# Patient Record
Sex: Male | Born: 1955 | Race: White | Hispanic: No | Marital: Single | Smoking: Current every day smoker
Health system: Southern US, Community
[De-identification: ages and names within clinical notes are randomized; demographics above are authoritative.]

## PROBLEM LIST (undated history)

## (undated) DIAGNOSIS — A419 Sepsis, unspecified organism: Secondary | ICD-10-CM

## (undated) DIAGNOSIS — F32A Depression, unspecified: Secondary | ICD-10-CM

## (undated) DIAGNOSIS — F319 Bipolar disorder, unspecified: Secondary | ICD-10-CM

## (undated) DIAGNOSIS — F411 Generalized anxiety disorder: Secondary | ICD-10-CM

## (undated) DIAGNOSIS — K219 Gastro-esophageal reflux disease without esophagitis: Secondary | ICD-10-CM

## (undated) DIAGNOSIS — F329 Major depressive disorder, single episode, unspecified: Secondary | ICD-10-CM

## (undated) DIAGNOSIS — R0602 Shortness of breath: Secondary | ICD-10-CM

## (undated) DIAGNOSIS — F419 Anxiety disorder, unspecified: Secondary | ICD-10-CM

## (undated) DIAGNOSIS — I499 Cardiac arrhythmia, unspecified: Secondary | ICD-10-CM

## (undated) DIAGNOSIS — M6282 Rhabdomyolysis: Secondary | ICD-10-CM

## (undated) DIAGNOSIS — R5381 Other malaise: Secondary | ICD-10-CM

## (undated) DIAGNOSIS — J449 Chronic obstructive pulmonary disease, unspecified: Secondary | ICD-10-CM

## (undated) DIAGNOSIS — R569 Unspecified convulsions: Secondary | ICD-10-CM

## (undated) DIAGNOSIS — G252 Other specified forms of tremor: Secondary | ICD-10-CM

## (undated) DIAGNOSIS — G40909 Epilepsy, unspecified, not intractable, without status epilepticus: Secondary | ICD-10-CM

## (undated) DIAGNOSIS — I10 Essential (primary) hypertension: Secondary | ICD-10-CM

## (undated) DIAGNOSIS — L409 Psoriasis, unspecified: Secondary | ICD-10-CM

## (undated) DIAGNOSIS — F259 Schizoaffective disorder, unspecified: Secondary | ICD-10-CM

## (undated) DIAGNOSIS — I5189 Other ill-defined heart diseases: Secondary | ICD-10-CM

## (undated) DIAGNOSIS — R51 Headache: Secondary | ICD-10-CM

## (undated) HISTORY — PX: PLEURAL SCARIFICATION: SHX748

## (undated) HISTORY — DX: Generalized anxiety disorder: F41.1

## (undated) HISTORY — PX: SHOULDER SURGERY: SHX246

## (undated) HISTORY — DX: Schizoaffective disorder, unspecified: F25.9

## (undated) HISTORY — DX: Essential (primary) hypertension: I10

## (undated) HISTORY — PX: SKIN GRAFT: SHX250

## (undated) HISTORY — PX: INTRAOCULAR LENS INSERTION: SHX110

## (undated) HISTORY — DX: Chronic obstructive pulmonary disease, unspecified: J44.9

## (undated) HISTORY — DX: Gastro-esophageal reflux disease without esophagitis: K21.9

## (undated) HISTORY — DX: Epilepsy, unspecified, not intractable, without status epilepticus: G40.909

## (undated) HISTORY — DX: Psoriasis, unspecified: L40.9

## (undated) HISTORY — PX: TOE AMPUTATION: SHX809

## (undated) HISTORY — DX: Bipolar disorder, unspecified: F31.9

---

## 2003-12-11 ENCOUNTER — Inpatient Hospital Stay (HOSPITAL_COMMUNITY): Admission: AD | Admit: 2003-12-11 | Discharge: 2003-12-16 | Payer: Self-pay | Admitting: Psychiatry

## 2009-04-04 ENCOUNTER — Ambulatory Visit: Payer: Self-pay | Admitting: Cardiology

## 2009-04-12 ENCOUNTER — Encounter: Payer: Self-pay | Admitting: Cardiology

## 2009-04-12 ENCOUNTER — Encounter (HOSPITAL_COMMUNITY): Admission: RE | Admit: 2009-04-12 | Discharge: 2009-05-12 | Payer: Self-pay | Admitting: Cardiology

## 2009-04-12 ENCOUNTER — Ambulatory Visit: Payer: Self-pay | Admitting: Cardiology

## 2009-05-01 ENCOUNTER — Ambulatory Visit: Payer: Self-pay | Admitting: Cardiology

## 2009-05-01 ENCOUNTER — Encounter: Payer: Self-pay | Admitting: Physician Assistant

## 2009-05-01 DIAGNOSIS — R569 Unspecified convulsions: Secondary | ICD-10-CM | POA: Insufficient documentation

## 2009-05-01 DIAGNOSIS — F319 Bipolar disorder, unspecified: Secondary | ICD-10-CM | POA: Insufficient documentation

## 2009-05-01 DIAGNOSIS — R079 Chest pain, unspecified: Secondary | ICD-10-CM | POA: Insufficient documentation

## 2009-05-01 DIAGNOSIS — F411 Generalized anxiety disorder: Secondary | ICD-10-CM | POA: Insufficient documentation

## 2009-05-01 DIAGNOSIS — F259 Schizoaffective disorder, unspecified: Secondary | ICD-10-CM | POA: Insufficient documentation

## 2009-05-01 DIAGNOSIS — K219 Gastro-esophageal reflux disease without esophagitis: Secondary | ICD-10-CM | POA: Insufficient documentation

## 2009-05-01 DIAGNOSIS — I10 Essential (primary) hypertension: Secondary | ICD-10-CM | POA: Insufficient documentation

## 2009-05-01 DIAGNOSIS — I4510 Unspecified right bundle-branch block: Secondary | ICD-10-CM | POA: Insufficient documentation

## 2009-05-01 DIAGNOSIS — L408 Other psoriasis: Secondary | ICD-10-CM | POA: Insufficient documentation

## 2009-05-01 DIAGNOSIS — J449 Chronic obstructive pulmonary disease, unspecified: Secondary | ICD-10-CM | POA: Insufficient documentation

## 2010-02-14 ENCOUNTER — Encounter (INDEPENDENT_AMBULATORY_CARE_PROVIDER_SITE_OTHER): Payer: Self-pay | Admitting: Internal Medicine

## 2010-02-14 ENCOUNTER — Ambulatory Visit: Payer: Self-pay | Admitting: Cardiology

## 2010-02-14 ENCOUNTER — Observation Stay (HOSPITAL_COMMUNITY): Admission: EM | Admit: 2010-02-14 | Discharge: 2010-02-14 | Payer: Self-pay | Admitting: Emergency Medicine

## 2010-03-08 ENCOUNTER — Emergency Department (HOSPITAL_COMMUNITY): Admission: EM | Admit: 2010-03-08 | Discharge: 2010-03-08 | Payer: Self-pay | Admitting: Emergency Medicine

## 2010-05-23 ENCOUNTER — Emergency Department (HOSPITAL_COMMUNITY): Admission: EM | Admit: 2010-05-23 | Discharge: 2010-05-23 | Payer: Self-pay | Admitting: Emergency Medicine

## 2011-02-03 LAB — HEPATIC FUNCTION PANEL
ALT: 15 U/L (ref 0–53)
Albumin: 4.5 g/dL (ref 3.5–5.2)
Alkaline Phosphatase: 85 U/L (ref 39–117)
Total Bilirubin: 0.9 mg/dL (ref 0.3–1.2)

## 2011-02-03 LAB — DIFFERENTIAL
Basophils Absolute: 0 10*3/uL (ref 0.0–0.1)
Basophils Relative: 0 % (ref 0–1)
Eosinophils Absolute: 0.1 10*3/uL (ref 0.0–0.7)
Eosinophils Relative: 1 % (ref 0–5)
Lymphocytes Relative: 28 % (ref 12–46)
Lymphs Abs: 2.9 10*3/uL (ref 0.7–4.0)
Monocytes Absolute: 0.6 10*3/uL (ref 0.1–1.0)
Monocytes Relative: 6 % (ref 3–12)
Neutro Abs: 6.9 10*3/uL (ref 1.7–7.7)
Neutrophils Relative %: 66 % (ref 43–77)

## 2011-02-03 LAB — BASIC METABOLIC PANEL
BUN: 7 mg/dL (ref 6–23)
CO2: 21 mEq/L (ref 19–32)
Calcium: 9.2 mg/dL (ref 8.4–10.5)
Chloride: 100 mEq/L (ref 96–112)
Creatinine, Ser: 1.09 mg/dL (ref 0.4–1.5)
GFR calc Af Amer: >60 mL/min (ref >60)
GFR calc non Af Amer: >60 mL/min (ref >60)
Glucose, Bld: 99 mg/dL (ref 70–99)
Potassium: 3.2 mEq/L — ABNORMAL LOW (ref 3.5–5.1)
Sodium: 128 mEq/L — ABNORMAL LOW (ref 135–145)

## 2011-02-03 LAB — POCT CARDIAC MARKERS
CKMB, poc: 2.4 ng/mL (ref 1.0–8.0)
Myoglobin, poc: 146 ng/mL (ref 12–200)
Troponin i, poc: <0.05 ng/mL (ref 0.00–0.09)

## 2011-02-03 LAB — URINALYSIS, ROUTINE W REFLEX MICROSCOPIC
Bilirubin Urine: NEGATIVE
Glucose, UA: NEGATIVE mg/dL
Ketones, ur: NEGATIVE mg/dL
Leukocytes, UA: NEGATIVE
Nitrite: NEGATIVE
Protein, ur: NEGATIVE mg/dL
Specific Gravity, Urine: <1.005 — ABNORMAL LOW (ref 1.005–1.030)
Urobilinogen, UA: 0.2 mg/dL (ref 0.0–1.0)
pH: 5.5 (ref 5.0–8.0)

## 2011-02-03 LAB — CBC
HCT: 40.6 % (ref 39.0–52.0)
MCH: 32.9 pg (ref 26.0–34.0)
MCHC: 34.5 g/dL (ref 30.0–36.0)
RBC: 4.25 MIL/uL (ref 4.22–5.81)
RDW: 13.3 % (ref 11.5–15.5)
WBC: 10.5 10*3/uL (ref 4.0–10.5)

## 2011-02-03 LAB — GLUCOSE, CAPILLARY: Glucose-Capillary: 110 mg/dL — ABNORMAL HIGH (ref 70–99)

## 2011-02-03 LAB — URINE MICROSCOPIC-ADD ON

## 2011-02-11 LAB — BRAIN NATRIURETIC PEPTIDE: Pro B Natriuretic peptide (BNP): <30 pg/mL (ref 0.0–100.0)

## 2011-02-11 LAB — CBC
Hemoglobin: 13.6 g/dL (ref 13.0–17.0)
RBC: 4.07 MIL/uL — ABNORMAL LOW (ref 4.22–5.81)
RDW: 13.4 % (ref 11.5–15.5)

## 2011-02-11 LAB — TSH: TSH: 1.299 u[IU]/mL (ref 0.350–4.500)

## 2011-02-11 LAB — BASIC METABOLIC PANEL
BUN: 7 mg/dL (ref 6–23)
CO2: 25 mEq/L (ref 19–32)
Creatinine, Ser: 0.75 mg/dL (ref 0.4–1.5)
GFR calc Af Amer: >60 mL/min (ref >60)
GFR calc non Af Amer: >60 mL/min (ref >60)
Glucose, Bld: 83 mg/dL (ref 70–99)

## 2011-02-11 LAB — DIFFERENTIAL
Basophils Absolute: 0 10*3/uL (ref 0.0–0.1)
Lymphs Abs: 4.2 10*3/uL — ABNORMAL HIGH (ref 0.7–4.0)
Monocytes Relative: 5 % (ref 3–12)
Neutro Abs: 9.7 10*3/uL — ABNORMAL HIGH (ref 1.7–7.7)
Neutrophils Relative %: 65 % (ref 43–77)

## 2011-02-11 LAB — CARDIAC PANEL(CRET KIN+CKTOT+MB+TROPI)
Total CK: 138 U/L (ref 7–232)
Troponin I: 0.01 ng/mL (ref 0.00–0.06)
Troponin I: 0.04 ng/mL (ref 0.00–0.06)

## 2011-02-11 LAB — POCT CARDIAC MARKERS: Troponin i, poc: <0.05 ng/mL (ref 0.00–0.09)

## 2011-02-11 LAB — PHOSPHORUS: Phosphorus: 3.3 mg/dL (ref 2.3–4.6)

## 2011-02-11 LAB — MRSA PCR SCREENING: MRSA by PCR: NEGATIVE

## 2011-02-11 LAB — LIPID PANEL
LDL Cholesterol: 67 mg/dL (ref 0–99)
Triglycerides: 41 mg/dL (ref <150)

## 2011-02-11 LAB — LITHIUM LEVEL: Lithium Lvl: 0.53 mEq/L — ABNORMAL LOW (ref 0.80–1.40)

## 2011-02-15 ENCOUNTER — Inpatient Hospital Stay (HOSPITAL_COMMUNITY): Admit: 2011-02-15 | Payer: Self-pay | Source: Other Acute Inpatient Hospital | Admitting: Cardiology

## 2011-02-15 ENCOUNTER — Emergency Department (HOSPITAL_COMMUNITY): Payer: Medicaid Other

## 2011-02-15 ENCOUNTER — Emergency Department (HOSPITAL_COMMUNITY)
Admission: EM | Admit: 2011-02-15 | Discharge: 2011-02-15 | Disposition: A | Discharge status: Home / Self Care | Payer: Medicaid Other | Attending: Emergency Medicine | Admitting: Emergency Medicine

## 2011-02-15 DIAGNOSIS — I2510 Atherosclerotic heart disease of native coronary artery without angina pectoris: Secondary | ICD-10-CM | POA: Insufficient documentation

## 2011-02-15 DIAGNOSIS — Z79899 Other long term (current) drug therapy: Secondary | ICD-10-CM | POA: Insufficient documentation

## 2011-02-15 DIAGNOSIS — G40909 Epilepsy, unspecified, not intractable, without status epilepticus: Secondary | ICD-10-CM | POA: Insufficient documentation

## 2011-02-15 DIAGNOSIS — J4489 Other specified chronic obstructive pulmonary disease: Secondary | ICD-10-CM | POA: Insufficient documentation

## 2011-02-15 DIAGNOSIS — I10 Essential (primary) hypertension: Secondary | ICD-10-CM | POA: Insufficient documentation

## 2011-02-15 DIAGNOSIS — J449 Chronic obstructive pulmonary disease, unspecified: Secondary | ICD-10-CM | POA: Insufficient documentation

## 2011-02-15 DIAGNOSIS — E876 Hypokalemia: Secondary | ICD-10-CM | POA: Insufficient documentation

## 2011-02-15 LAB — CBC
HCT: 41 % (ref 39.0–52.0)
Hemoglobin: 14.3 g/dL (ref 13.0–17.0)
MCH: 32.4 pg (ref 26.0–34.0)
MCV: 92.8 fL (ref 78.0–100.0)
Platelets: 267 10*3/uL (ref 150–400)
RBC: 4.42 MIL/uL (ref 4.22–5.81)

## 2011-02-15 LAB — DIFFERENTIAL
Eosinophils Absolute: 0.1 10*3/uL (ref 0.0–0.7)
Lymphocytes Relative: 29 % (ref 12–46)
Lymphs Abs: 2.3 10*3/uL (ref 0.7–4.0)
Monocytes Relative: 6 % (ref 3–12)
Neutrophils Relative %: 63 % (ref 43–77)

## 2011-02-15 LAB — BASIC METABOLIC PANEL
BUN: 9 mg/dL (ref 6–23)
CO2: 19 mEq/L (ref 19–32)
Chloride: 103 mEq/L (ref 96–112)
Creatinine, Ser: 0.88 mg/dL (ref 0.4–1.5)
Glucose, Bld: 90 mg/dL (ref 70–99)

## 2011-02-15 LAB — APTT: aPTT: 36 seconds (ref 24–37)

## 2011-02-15 LAB — POCT CARDIAC MARKERS
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
## 2011-05-04 ENCOUNTER — Inpatient Hospital Stay: Payer: Self-pay | Admitting: Unknown Physician Specialty

## 2011-05-12 ENCOUNTER — Emergency Department: Payer: Self-pay | Admitting: Emergency Medicine

## 2011-06-24 ENCOUNTER — Emergency Department: Payer: Self-pay | Admitting: *Deleted

## 2011-07-08 ENCOUNTER — Inpatient Hospital Stay: Payer: Self-pay | Admitting: Psychiatry

## 2011-10-14 ENCOUNTER — Encounter: Payer: Self-pay | Admitting: Cardiology

## 2011-11-19 LAB — COMPREHENSIVE METABOLIC PANEL
Albumin: 4.2 g/dL (ref 3.4–5.0)
Alkaline Phosphatase: 101 U/L (ref 50–136)
Anion Gap: 10 (ref 7–16)
BUN: 8 mg/dL (ref 7–18)
Bilirubin,Total: 0.3 mg/dL (ref 0.2–1.0)
Calcium, Total: 9 mg/dL (ref 8.5–10.1)
Chloride: 102 mmol/L (ref 98–107)
Co2: 25 mmol/L (ref 21–32)
Creatinine: 0.8 mg/dL (ref 0.60–1.30)
EGFR (African American): >60
EGFR (Non-African Amer.): >60
Glucose: 108 mg/dL — ABNORMAL HIGH (ref 65–99)
Osmolality: 273 (ref 275–301)
Potassium: 3.7 mmol/L (ref 3.5–5.1)
SGOT(AST): 16 U/L (ref 15–37)
SGPT (ALT): 25 U/L
Sodium: 137 mmol/L (ref 136–145)
Total Protein: 7.4 g/dL (ref 6.4–8.2)

## 2011-11-19 LAB — CBC
HCT: 41.8 % (ref 40.0–52.0)
HGB: 14.2 g/dL (ref 13.0–18.0)
MCH: 32.7 pg (ref 26.0–34.0)
MCHC: 34.1 g/dL (ref 32.0–36.0)
MCV: 96 fL (ref 80–100)
Platelet: 210 10*3/uL (ref 150–440)
RBC: 4.35 10*6/uL — ABNORMAL LOW (ref 4.40–5.90)
RDW: 14 % (ref 11.5–14.5)
WBC: 7.1 10*3/uL (ref 3.8–10.6)

## 2011-11-19 LAB — DRUG SCREEN, URINE
Amphetamines, Ur Screen: NEGATIVE (ref ?–1000)
Barbiturates, Ur Screen: NEGATIVE (ref ?–200)
Cocaine Metabolite,Ur ~~LOC~~: NEGATIVE (ref ?–300)
MDMA (Ecstasy)Ur Screen: NEGATIVE (ref ?–500)
Opiate, Ur Screen: NEGATIVE (ref ?–300)
Phencyclidine (PCP) Ur S: NEGATIVE (ref ?–25)
Tricyclic, Ur Screen: NEGATIVE (ref ?–1000)

## 2011-11-19 LAB — TSH: Thyroid Stimulating Horm: 0.46 u[IU]/mL

## 2011-11-19 LAB — SALICYLATE LEVEL: Salicylates, Serum: <1.7 mg/dL

## 2011-11-20 ENCOUNTER — Inpatient Hospital Stay: Payer: Self-pay | Admitting: Psychiatry

## 2011-11-21 LAB — BEHAVIORAL MEDICINE 1 PANEL
Alkaline Phosphatase: 93 U/L (ref 50–136)
Anion Gap: 7 (ref 7–16)
Bilirubin,Total: 0.3 mg/dL (ref 0.2–1.0)
Chloride: 100 mmol/L (ref 98–107)
Co2: 29 mmol/L (ref 21–32)
Creatinine: 0.97 mg/dL (ref 0.60–1.30)
EGFR (African American): >60
EGFR (Non-African Amer.): >60
Eosinophil %: 2.9 %
HCT: 43.5 % (ref 40.0–52.0)
Lymphocyte #: 2.9 10*3/uL (ref 1.0–3.6)
Lymphocyte %: 26.6 %
MCH: 32 pg (ref 26.0–34.0)
MCHC: 32.8 g/dL (ref 32.0–36.0)
MCV: 97 fL (ref 80–100)
Neutrophil #: 7.1 10*3/uL — ABNORMAL HIGH (ref 1.4–6.5)
Osmolality: 270 (ref 275–301)
Potassium: 4.4 mmol/L (ref 3.5–5.1)
RDW: 13.8 % (ref 11.5–14.5)
Sodium: 136 mmol/L (ref 136–145)
Total Protein: 6.9 g/dL (ref 6.4–8.2)

## 2011-11-21 LAB — URINALYSIS, COMPLETE
Bilirubin,UR: NEGATIVE
Blood: NEGATIVE
Glucose,UR: NEGATIVE mg/dL (ref 0–75)
Ketone: NEGATIVE
Specific Gravity: 1.001 (ref 1.003–1.030)
WBC UR: <1 /HPF (ref 0–5)

## 2011-11-22 LAB — BEHAVIORAL MEDICINE 1 PANEL
Anion Gap: 9 (ref 7–16)
BUN: 10 mg/dL (ref 7–18)
Basophil %: 1 %
Chloride: 98 mmol/L (ref 98–107)
Co2: 29 mmol/L (ref 21–32)
EGFR (African American): >60
Eosinophil #: 0.3 10*3/uL (ref 0.0–0.7)
HCT: 44.2 % (ref 40.0–52.0)
HGB: 14.7 g/dL (ref 13.0–18.0)
MCH: 32.2 pg (ref 26.0–34.0)
MCHC: 33.2 g/dL (ref 32.0–36.0)
Monocyte #: 0.5 10*3/uL (ref 0.0–0.7)
Neutrophil #: 2.8 10*3/uL (ref 1.4–6.5)
Neutrophil %: 49.6 %
Osmolality: 270 (ref 275–301)
Platelet: 202 10*3/uL (ref 150–440)
SGOT(AST): 11 U/L — ABNORMAL LOW (ref 15–37)
SGPT (ALT): 21 U/L
Thyroid Stimulating Horm: 1.26 u[IU]/mL
Total Protein: 7 g/dL (ref 6.4–8.2)

## 2011-11-25 LAB — BASIC METABOLIC PANEL
BUN: 17 mg/dL (ref 7–18)
Chloride: 99 mmol/L (ref 98–107)
Co2: 31 mmol/L (ref 21–32)
Creatinine: 0.82 mg/dL (ref 0.60–1.30)
EGFR (African American): >60
Sodium: 138 mmol/L (ref 136–145)

## 2012-04-27 LAB — COMPREHENSIVE METABOLIC PANEL
Albumin: 3.8 g/dL (ref 3.4–5.0)
BUN: 12 mg/dL (ref 7–18)
Bilirubin,Total: 0.3 mg/dL (ref 0.2–1.0)
Calcium, Total: 8.8 mg/dL (ref 8.5–10.1)
Chloride: 98 mmol/L (ref 98–107)
Creatinine: 0.95 mg/dL (ref 0.60–1.30)
EGFR (African American): >60
Osmolality: 268 (ref 275–301)
Potassium: 4.1 mmol/L (ref 3.5–5.1)
SGOT(AST): 13 U/L — ABNORMAL LOW (ref 15–37)
SGPT (ALT): 20 U/L
Sodium: 134 mmol/L — ABNORMAL LOW (ref 136–145)

## 2012-04-27 LAB — CBC
HCT: 38.3 % — ABNORMAL LOW (ref 40.0–52.0)
MCH: 33 pg (ref 26.0–34.0)
MCHC: 34.9 g/dL (ref 32.0–36.0)
MCV: 95 fL (ref 80–100)
Platelet: 219 10*3/uL (ref 150–440)
RBC: 4.05 10*6/uL — ABNORMAL LOW (ref 4.40–5.90)
RDW: 14.9 % — ABNORMAL HIGH (ref 11.5–14.5)

## 2012-04-27 LAB — LITHIUM LEVEL: Lithium: 0.65 mmol/L

## 2012-04-27 LAB — TSH: Thyroid Stimulating Horm: 0.62 u[IU]/mL

## 2012-04-27 LAB — ETHANOL: Ethanol %: <0.003 % (ref 0.000–0.080)

## 2012-04-28 ENCOUNTER — Inpatient Hospital Stay: Payer: Self-pay | Admitting: Psychiatry

## 2012-04-28 LAB — DRUG SCREEN, URINE
Amphetamines, Ur Screen: NEGATIVE (ref ?–1000)
Barbiturates, Ur Screen: NEGATIVE (ref ?–200)
Benzodiazepine, Ur Scrn: NEGATIVE (ref ?–200)
Cocaine Metabolite,Ur ~~LOC~~: NEGATIVE (ref ?–300)
MDMA (Ecstasy)Ur Screen: NEGATIVE (ref ?–500)
Methadone, Ur Screen: NEGATIVE (ref ?–300)
Opiate, Ur Screen: NEGATIVE (ref ?–300)
Phencyclidine (PCP) Ur S: NEGATIVE (ref ?–25)
Tricyclic, Ur Screen: NEGATIVE (ref ?–1000)

## 2012-04-28 LAB — URINALYSIS, COMPLETE
Bacteria: NONE SEEN
Glucose,UR: NEGATIVE mg/dL (ref 0–75)
Ketone: NEGATIVE
Protein: NEGATIVE
Squamous Epithelial: NONE SEEN

## 2012-05-03 LAB — COMPREHENSIVE METABOLIC PANEL
Albumin: 3.5 g/dL (ref 3.4–5.0)
Alkaline Phosphatase: 108 U/L (ref 50–136)
Anion Gap: 8 (ref 7–16)
BUN: 17 mg/dL (ref 7–18)
Bilirubin,Total: 0.3 mg/dL (ref 0.2–1.0)
Calcium, Total: 8.7 mg/dL (ref 8.5–10.1)
Chloride: 99 mmol/L (ref 98–107)
Co2: 27 mmol/L (ref 21–32)
Creatinine: 0.89 mg/dL (ref 0.60–1.30)
EGFR (African American): >60
EGFR (Non-African Amer.): >60
Glucose: 83 mg/dL (ref 65–99)
Osmolality: 269 (ref 275–301)
Potassium: 4.4 mmol/L (ref 3.5–5.1)
SGPT (ALT): 23 U/L
Sodium: 134 mmol/L — ABNORMAL LOW (ref 136–145)
Total Protein: 6.6 g/dL (ref 6.4–8.2)

## 2012-05-03 LAB — TROPONIN I: Troponin-I: <0.02 ng/mL

## 2012-05-03 LAB — CK-MB: CK-MB: 1.3 ng/mL (ref 0.5–3.6)

## 2012-05-03 LAB — CK: CK, Total: 79 U/L (ref 35–232)

## 2012-05-03 LAB — MAGNESIUM: Magnesium: 2 mg/dL

## 2012-05-04 LAB — LIPID PANEL
Cholesterol: 147 mg/dL (ref 0–200)
HDL Cholesterol: 39 mg/dL — ABNORMAL LOW (ref 40–60)
Ldl Cholesterol, Calc: 63 mg/dL (ref 0–100)
Triglycerides: 223 mg/dL — ABNORMAL HIGH (ref 0–200)
VLDL Cholesterol, Calc: 45 mg/dL — ABNORMAL HIGH (ref 5–40)

## 2012-05-04 LAB — TROPONIN I: Troponin-I: <0.02 ng/mL

## 2012-05-04 LAB — CK-MB: CK-MB: 1.2 ng/mL (ref 0.5–3.6)

## 2012-10-09 ENCOUNTER — Emergency Department: Payer: Self-pay | Admitting: Emergency Medicine

## 2013-04-08 ENCOUNTER — Emergency Department (HOSPITAL_COMMUNITY)
Admission: EM | Admit: 2013-04-08 | Discharge: 2013-04-08 | Disposition: A | Discharge status: Home / Self Care | Payer: Medicaid Other | Attending: Emergency Medicine | Admitting: Emergency Medicine

## 2013-04-08 ENCOUNTER — Encounter (HOSPITAL_COMMUNITY): Payer: Self-pay | Admitting: Emergency Medicine

## 2013-04-08 DIAGNOSIS — Z872 Personal history of diseases of the skin and subcutaneous tissue: Secondary | ICD-10-CM | POA: Insufficient documentation

## 2013-04-08 DIAGNOSIS — F259 Schizoaffective disorder, unspecified: Secondary | ICD-10-CM | POA: Insufficient documentation

## 2013-04-08 DIAGNOSIS — I10 Essential (primary) hypertension: Secondary | ICD-10-CM | POA: Insufficient documentation

## 2013-04-08 DIAGNOSIS — K219 Gastro-esophageal reflux disease without esophagitis: Secondary | ICD-10-CM | POA: Insufficient documentation

## 2013-04-08 DIAGNOSIS — Z880 Allergy status to penicillin: Secondary | ICD-10-CM | POA: Insufficient documentation

## 2013-04-08 DIAGNOSIS — Z7982 Long term (current) use of aspirin: Secondary | ICD-10-CM | POA: Insufficient documentation

## 2013-04-08 DIAGNOSIS — F17200 Nicotine dependence, unspecified, uncomplicated: Secondary | ICD-10-CM | POA: Insufficient documentation

## 2013-04-08 DIAGNOSIS — F32A Depression, unspecified: Secondary | ICD-10-CM

## 2013-04-08 DIAGNOSIS — F411 Generalized anxiety disorder: Secondary | ICD-10-CM | POA: Insufficient documentation

## 2013-04-08 DIAGNOSIS — F329 Major depressive disorder, single episode, unspecified: Secondary | ICD-10-CM

## 2013-04-08 DIAGNOSIS — J449 Chronic obstructive pulmonary disease, unspecified: Secondary | ICD-10-CM | POA: Insufficient documentation

## 2013-04-08 DIAGNOSIS — G40909 Epilepsy, unspecified, not intractable, without status epilepticus: Secondary | ICD-10-CM | POA: Insufficient documentation

## 2013-04-08 DIAGNOSIS — J4489 Other specified chronic obstructive pulmonary disease: Secondary | ICD-10-CM | POA: Insufficient documentation

## 2013-04-08 DIAGNOSIS — F3130 Bipolar disorder, current episode depressed, mild or moderate severity, unspecified: Secondary | ICD-10-CM | POA: Insufficient documentation

## 2013-04-08 DIAGNOSIS — IMO0002 Reserved for concepts with insufficient information to code with codable children: Secondary | ICD-10-CM | POA: Insufficient documentation

## 2013-04-08 DIAGNOSIS — Z79899 Other long term (current) drug therapy: Secondary | ICD-10-CM | POA: Insufficient documentation
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
## 2013-04-08 NOTE — ED Notes (Signed)
Pt states here for depression that has been getting worse over past three months. Pt states has been thinking of ways to hurt himself.denies attempting recently.

## 2013-04-08 NOTE — ED Provider Notes (Signed)
Tele-psychiatry consultation obtained.   No suicidal or homicidal ideation.   Patient can followup with community mental health resources   Donnetta Hutching, MD 04/08/13 1800

## 2013-04-08 NOTE — ED Notes (Signed)
Pt online with tele-psychiatrist.
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
## 2013-04-13 NOTE — ED Provider Notes (Signed)
Dr Italy Sheldon is attending  Jason Hutching, MD 04/13/13 [phone]

## 2013-08-17 ENCOUNTER — Emergency Department (HOSPITAL_COMMUNITY): Payer: Medicaid Other

## 2013-08-17 ENCOUNTER — Emergency Department (HOSPITAL_COMMUNITY)
Admission: EM | Admit: 2013-08-17 | Discharge: 2013-08-17 | Disposition: A | Discharge status: Home / Self Care | Payer: Medicaid Other | Attending: Emergency Medicine | Admitting: Emergency Medicine

## 2013-08-17 ENCOUNTER — Encounter (HOSPITAL_COMMUNITY): Payer: Self-pay | Admitting: Emergency Medicine

## 2013-08-17 DIAGNOSIS — L408 Other psoriasis: Secondary | ICD-10-CM | POA: Insufficient documentation

## 2013-08-17 DIAGNOSIS — Z7982 Long term (current) use of aspirin: Secondary | ICD-10-CM | POA: Insufficient documentation

## 2013-08-17 DIAGNOSIS — G40909 Epilepsy, unspecified, not intractable, without status epilepticus: Secondary | ICD-10-CM | POA: Insufficient documentation

## 2013-08-17 DIAGNOSIS — F259 Schizoaffective disorder, unspecified: Secondary | ICD-10-CM | POA: Insufficient documentation

## 2013-08-17 DIAGNOSIS — S298XXA Other specified injuries of thorax, initial encounter: Secondary | ICD-10-CM | POA: Insufficient documentation

## 2013-08-17 DIAGNOSIS — F411 Generalized anxiety disorder: Secondary | ICD-10-CM | POA: Insufficient documentation

## 2013-08-17 DIAGNOSIS — Z79899 Other long term (current) drug therapy: Secondary | ICD-10-CM | POA: Insufficient documentation

## 2013-08-17 DIAGNOSIS — I10 Essential (primary) hypertension: Secondary | ICD-10-CM | POA: Insufficient documentation

## 2013-08-17 DIAGNOSIS — IMO0002 Reserved for concepts with insufficient information to code with codable children: Secondary | ICD-10-CM | POA: Insufficient documentation

## 2013-08-17 DIAGNOSIS — X58XXXA Exposure to other specified factors, initial encounter: Secondary | ICD-10-CM | POA: Insufficient documentation

## 2013-08-17 DIAGNOSIS — J4489 Other specified chronic obstructive pulmonary disease: Secondary | ICD-10-CM | POA: Insufficient documentation

## 2013-08-17 DIAGNOSIS — F319 Bipolar disorder, unspecified: Secondary | ICD-10-CM | POA: Insufficient documentation

## 2013-08-17 DIAGNOSIS — S301XXA Contusion of abdominal wall, initial encounter: Secondary | ICD-10-CM | POA: Insufficient documentation

## 2013-08-17 DIAGNOSIS — Y939 Activity, unspecified: Secondary | ICD-10-CM | POA: Insufficient documentation

## 2013-08-17 DIAGNOSIS — Y929 Unspecified place or not applicable: Secondary | ICD-10-CM | POA: Insufficient documentation

## 2013-08-17 DIAGNOSIS — T148XXA Other injury of unspecified body region, initial encounter: Secondary | ICD-10-CM

## 2013-08-17 DIAGNOSIS — Z8701 Personal history of pneumonia (recurrent): Secondary | ICD-10-CM | POA: Insufficient documentation

## 2013-08-17 DIAGNOSIS — J449 Chronic obstructive pulmonary disease, unspecified: Secondary | ICD-10-CM | POA: Insufficient documentation

## 2013-08-17 DIAGNOSIS — Z791 Long term (current) use of non-steroidal anti-inflammatories (NSAID): Secondary | ICD-10-CM | POA: Insufficient documentation

## 2013-08-17 DIAGNOSIS — Z880 Allergy status to penicillin: Secondary | ICD-10-CM | POA: Insufficient documentation

## 2013-08-17 DIAGNOSIS — K219 Gastro-esophageal reflux disease without esophagitis: Secondary | ICD-10-CM | POA: Insufficient documentation

## 2013-08-17 DIAGNOSIS — F17200 Nicotine dependence, unspecified, uncomplicated: Secondary | ICD-10-CM | POA: Insufficient documentation

## 2013-08-17 LAB — CBC WITH DIFFERENTIAL/PLATELET
Basophils Absolute: 0 10*3/uL (ref 0.0–0.1)
Basophils Relative: 0 % (ref 0–1)
Eosinophils Relative: 1 % (ref 0–5)
HCT: 38.5 % — ABNORMAL LOW (ref 39.0–52.0)
Hemoglobin: 13.1 g/dL (ref 13.0–17.0)
MCH: 32.3 pg (ref 26.0–34.0)
MCHC: 34 g/dL (ref 30.0–36.0)
Monocytes Absolute: 0.6 10*3/uL (ref 0.1–1.0)
Monocytes Relative: 6 % (ref 3–12)
Neutro Abs: 6.4 10*3/uL (ref 1.7–7.7)
RDW: 13.7 % (ref 11.5–15.5)

## 2013-08-17 LAB — RAPID URINE DRUG SCREEN, HOSP PERFORMED
Barbiturates: NOT DETECTED
Benzodiazepines: NOT DETECTED
Cocaine: NOT DETECTED
Opiates: NOT DETECTED
Tetrahydrocannabinol: NOT DETECTED

## 2013-08-17 LAB — URINALYSIS, ROUTINE W REFLEX MICROSCOPIC
Bilirubin Urine: NEGATIVE
Ketones, ur: NEGATIVE mg/dL
Nitrite: NEGATIVE
Protein, ur: NEGATIVE mg/dL
Urobilinogen, UA: 0.2 mg/dL (ref 0.0–1.0)
pH: 6.5 (ref 5.0–8.0)

## 2013-08-17 LAB — COMPREHENSIVE METABOLIC PANEL
AST: 13 U/L (ref 0–37)
Albumin: 3.3 g/dL — ABNORMAL LOW (ref 3.5–5.2)
BUN: 10 mg/dL (ref 6–23)
Calcium: 8.9 mg/dL (ref 8.4–10.5)
Chloride: 97 mEq/L (ref 96–112)
Creatinine, Ser: 0.97 mg/dL (ref 0.50–1.35)
GFR calc Af Amer: >90 mL/min (ref >90)
GFR calc non Af Amer: 89 mL/min — ABNORMAL LOW (ref >90)
Glucose, Bld: 143 mg/dL — ABNORMAL HIGH (ref 70–99)
Total Bilirubin: 0.3 mg/dL (ref 0.3–1.2)

## 2013-08-17 LAB — PROTIME-INR: Prothrombin Time: 12.9 seconds (ref 11.6–15.2)

## 2013-08-17 LAB — LITHIUM LEVEL: Lithium Lvl: 0.72 mEq/L — ABNORMAL LOW (ref 0.80–1.40)

## 2013-08-17 MED ORDER — IOHEXOL 300 MG/ML  SOLN
100.0000 mL | Freq: Once | INTRAMUSCULAR | Status: AC | PRN
  Administered 2013-08-17: 100 mL via INTRAVENOUS

## 2013-08-17 MED ORDER — SODIUM CHLORIDE 0.9 % IV BOLUS (SEPSIS)
1000.0000 mL | Freq: Once | INTRAVENOUS | Status: AC
  Administered 2013-08-17: 1000 mL via INTRAVENOUS

## 2013-08-17 MED ORDER — FENTANYL CITRATE 0.05 MG/ML IJ SOLN
INTRAMUSCULAR | Status: AC
  Administered 2013-08-17: 50 ug via INTRAVENOUS
  Filled 2013-08-17: qty 2

## 2013-08-17 NOTE — ED Notes (Signed)
Patient signed paper discharge instructions during downtime  At 04:20 am

## 2013-08-17 NOTE — ED Notes (Signed)
C/o pain and tenderness left rib area under arm and down to left hip and thigh.  Bruising in various colors noted.  Some are deep purple and other areas on abdomen and arms have yellow tints and old red shades.  Patient was not aware of the bruising to his left side until a tech at [facility] asked him what had happened to him, " took a picture of it to show me on the phone"    Denies any known injury, denies any falls,

## 2013-08-17 NOTE — ED Notes (Signed)
Greggory Stallion will send someone here around 07:00 am to pick up.  Will try to send earlier if transport can be located.

## 2013-08-17 NOTE — ED Notes (Signed)
Patient to be discharged.  Attempting to find way home for patient

## 2013-08-17 NOTE — ED Notes (Signed)
Pt c/o left flank pain. Pt has diffused bruising from armpit to the abd. Pt does not know where the bruising is coming from.
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
## 2014-01-21 ENCOUNTER — Emergency Department (HOSPITAL_COMMUNITY)
Admission: EM | Admit: 2014-01-21 | Discharge: 2014-01-21 | Disposition: A | Discharge status: Home / Self Care | Payer: Medicaid Other | Attending: Emergency Medicine | Admitting: Emergency Medicine

## 2014-01-21 ENCOUNTER — Encounter (HOSPITAL_COMMUNITY): Payer: Self-pay | Admitting: Emergency Medicine

## 2014-01-21 DIAGNOSIS — R451 Restlessness and agitation: Secondary | ICD-10-CM

## 2014-01-21 DIAGNOSIS — J4489 Other specified chronic obstructive pulmonary disease: Secondary | ICD-10-CM | POA: Insufficient documentation

## 2014-01-21 DIAGNOSIS — F259 Schizoaffective disorder, unspecified: Secondary | ICD-10-CM | POA: Insufficient documentation

## 2014-01-21 DIAGNOSIS — F17200 Nicotine dependence, unspecified, uncomplicated: Secondary | ICD-10-CM | POA: Insufficient documentation

## 2014-01-21 DIAGNOSIS — Z79899 Other long term (current) drug therapy: Secondary | ICD-10-CM | POA: Insufficient documentation

## 2014-01-21 DIAGNOSIS — F411 Generalized anxiety disorder: Secondary | ICD-10-CM | POA: Insufficient documentation

## 2014-01-21 DIAGNOSIS — K219 Gastro-esophageal reflux disease without esophagitis: Secondary | ICD-10-CM | POA: Insufficient documentation

## 2014-01-21 DIAGNOSIS — Z791 Long term (current) use of non-steroidal anti-inflammatories (NSAID): Secondary | ICD-10-CM | POA: Insufficient documentation

## 2014-01-21 DIAGNOSIS — F3130 Bipolar disorder, current episode depressed, mild or moderate severity, unspecified: Secondary | ICD-10-CM | POA: Insufficient documentation

## 2014-01-21 DIAGNOSIS — Z872 Personal history of diseases of the skin and subcutaneous tissue: Secondary | ICD-10-CM | POA: Insufficient documentation

## 2014-01-21 DIAGNOSIS — F32A Depression, unspecified: Secondary | ICD-10-CM

## 2014-01-21 DIAGNOSIS — F329 Major depressive disorder, single episode, unspecified: Secondary | ICD-10-CM

## 2014-01-21 DIAGNOSIS — Z7982 Long term (current) use of aspirin: Secondary | ICD-10-CM | POA: Insufficient documentation

## 2014-01-21 DIAGNOSIS — G40909 Epilepsy, unspecified, not intractable, without status epilepticus: Secondary | ICD-10-CM | POA: Insufficient documentation

## 2014-01-21 DIAGNOSIS — IMO0002 Reserved for concepts with insufficient information to code with codable children: Secondary | ICD-10-CM | POA: Insufficient documentation

## 2014-01-21 DIAGNOSIS — Z880 Allergy status to penicillin: Secondary | ICD-10-CM | POA: Insufficient documentation

## 2014-01-21 DIAGNOSIS — J449 Chronic obstructive pulmonary disease, unspecified: Secondary | ICD-10-CM | POA: Insufficient documentation

## 2014-01-21 DIAGNOSIS — I10 Essential (primary) hypertension: Secondary | ICD-10-CM | POA: Insufficient documentation

## 2014-01-21 HISTORY — DX: Unspecified convulsions: R56.9

## 2014-01-21 LAB — BASIC METABOLIC PANEL
BUN: 5 mg/dL — ABNORMAL LOW (ref 6–23)
CALCIUM: 9.8 mg/dL (ref 8.4–10.5)
CHLORIDE: 98 meq/L (ref 96–112)
CO2: 23 meq/L (ref 19–32)
Creatinine, Ser: 0.92 mg/dL (ref 0.50–1.35)
GFR calc non Af Amer: >90 mL/min (ref >90)
Glucose, Bld: 111 mg/dL — ABNORMAL HIGH (ref 70–99)
Potassium: 3.7 mEq/L (ref 3.7–5.3)
SODIUM: 136 meq/L — AB (ref 137–147)

## 2014-01-21 LAB — CBC WITH DIFFERENTIAL/PLATELET
BASOS ABS: 0 10*3/uL (ref 0.0–0.1)
BASOS PCT: 0 % (ref 0–1)
Eosinophils Absolute: 0.1 10*3/uL (ref 0.0–0.7)
Eosinophils Relative: 1 % (ref 0–5)
HCT: 43.9 % (ref 39.0–52.0)
Hemoglobin: 15.1 g/dL (ref 13.0–17.0)
LYMPHS PCT: 21 % (ref 12–46)
Lymphs Abs: 2.1 10*3/uL (ref 0.7–4.0)
MCH: 32.8 pg (ref 26.0–34.0)
MCHC: 34.4 g/dL (ref 30.0–36.0)
MCV: 95.4 fL (ref 78.0–100.0)
Monocytes Absolute: 0.7 10*3/uL (ref 0.1–1.0)
Monocytes Relative: 7 % (ref 3–12)
NEUTROS ABS: 7.2 10*3/uL (ref 1.7–7.7)
Neutrophils Relative %: 72 % (ref 43–77)
PLATELETS: 218 10*3/uL (ref 150–400)
RBC: 4.6 MIL/uL (ref 4.22–5.81)
RDW: 14.4 % (ref 11.5–15.5)
WBC: 10 10*3/uL (ref 4.0–10.5)

## 2014-01-21 LAB — RAPID URINE DRUG SCREEN, HOSP PERFORMED
AMPHETAMINES: NOT DETECTED
BENZODIAZEPINES: NOT DETECTED
Barbiturates: NOT DETECTED
Cocaine: NOT DETECTED
OPIATES: NOT DETECTED
Tetrahydrocannabinol: NOT DETECTED

## 2014-01-21 LAB — ETHANOL
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
## 2014-01-21 NOTE — BH Assessment (Signed)
Clinician consulted with Alberteen SamFran Hobson NP who states Pt does not meet criteria for inpatient admission and requests for pt to contract for safety. Clinician contacted Dr. Clarene DukeMcManus to provide recommendation.  Clinician also faxed over No-Harm contract for pt to sign prior to discharge. Darl PikesSusan, RN was unavailable at this time per Olivia MackieFelinda, [place].   Yaakov Guthrieelilah Stewart, MSW, LCSW Triage Specialist [phone]

## 2014-01-21 NOTE — BH Assessment (Signed)
Clinician contacted Dr. Clarene DukeMcManus to gather report about pt prior to beginning assessment. Clinician contacted Elpidio GaleaSusan, Rn working with pt to request set up tele-assessment. Tele-assessment to be initiated.   Jason Patterson Jason Patterson, MSW, LCSW Triage Specialist [phone]

## 2014-01-21 NOTE — Discharge Instructions (Signed)
°Emergency Department Resource Guide °1) Find a Doctor and Pay Out of Pocket °Although you won't have to find out who is covered by your insurance plan, it is a good idea to ask around and get recommendations. You will then need to call the office and see if the doctor you have chosen will accept you as a new patient and what types of options they offer for patients who are self-pay. Some doctors offer discounts or will set up payment plans for their patients who do not have insurance, but you will need to ask so you aren't surprised when you get to your appointment. ° °2) Contact Your Local Health Department °Not all health departments have doctors that can see patients for sick visits, but many do, so it is worth a call to see if yours does. If you don't know where your local health department is, you can check in your phone book. The CDC also has a tool to help you locate your state's health department, and many state websites also have listings of all of their local health departments. ° °3) Find a Walk-in Clinic °If your illness is not likely to be very severe or complicated, you may want to try a walk in clinic. These are popping up all over the country in pharmacies, drugstores, and shopping centers. They're usually staffed by nurse practitioners or physician assistants that have been trained to treat common illnesses and complaints. They're usually fairly quick and inexpensive. However, if you have serious medical issues or chronic medical problems, these are probably not your best option. ° °No Primary Care Doctor: °- Call [facility] at  832-8000 - they can help you locate a primary care doctor that  accepts your insurance, provides certain services, etc. °- Physician Referral Service- 1-800-533-3463 ° °Chronic Pain Problems: °Organization         Address  Phone   Notes  °[facility]  (336) 297-2271 Patients need to be referred by their primary care doctor.  ° °Medication  Assistance: °Organization         Address  Phone   Notes  °[place] [facility] [address] °[place], [place] [address] (336) 641-8030 --Must be a resident of [place] °-- Must have NO insurance coverage whatsoever (no Medicaid/ Medicare, etc.) °-- The pt. MUST have a primary care doctor that directs their care regularly and follows them in the community °  °[facility]  (866) 331-1348   °[employer]  (888) 892-1162   ° °Agencies that provide inexpensive medical care: °Organization         Address  Phone   Notes  °[facility]  (336) 832-8035   °[facility]    (336) 832-7272   °[facility] [address] °[place], [place] [address] (336) 832-4777   °[facility] [address], °[place] (336) 271-4999   °[facility]    (336) 373-0678   °[facility]    (336) 272-1050   °[facility] ° [address], [place] Phone:  (336) 832-4444, Fax:  (336) 832-4440 Hours of Operation:  9 am - 6 pm, M-F.  Also accepts Medicaid/Medicare and self-pay.  °[facility] ° [address], [place] Phone: (336) 832-3150, Fax: (336) 832-3151. Hours of Operation:  8:30 am - 5:30 pm, M-F.  Also accepts Medicaid and self-pay.  °[facility] [place] [address], [place] Phone: (336) 878-6027   °[facility] [address], [place], [place] (336)723-1848, Ext. 123 Mondays & Thursdays: 7-9 AM.  First 15 patients are seen on a first come, first serve basis. °  ° °Medicaid-accepting [place] Providers: ° °Organization         Address  Phone   Notes  °[facility] [address], [place] (336) 641-2100 Also accepts self-pay patients.  °[facility] [address], [place] ° (336) 856-9996   °[facility] [address], [place]  (336) 288-8857   °[facility] [address] [place] [address], [place] (336) 299-7000   °Veita Bland [address], [place]  ° (336) 373-1557 Only accepts [place] Access Medicaid patients after they have their name applied to their card.  ° °Self-Pay (no insurance) in [place]: ° °Organization         Address  Phone   Notes  °Sickle Cell Patients, [facility] [address], [place] (336) 832-1970   °[facility] [address], [place] (336) 832-4400   °[facility] ° [address], [place] (336) 992-4800   °[facility]/Dr. Osei-Bonsu ° [address] [place] [address], [place] or [address], [place] (336) [phone] Phone number for both [place] and [place] locations is the same.  °[facility] [address], [place] (336) 299-0000   °[facility] [address] [place] [address], [place] or [address] (336) 852-7530 °(336) 878-2260   °[facility] [address], [place] (336) 350-1642, phone; (336) 294-5005, fax Sees patients 1st and 3rd Saturday of every month.  Must not qualify for public or private insurance (i.e. Medicaid, Medicare, [place] Health Choice, Veterans' Benefits) • Household income should be no more than 200% of the poverty level •The clinic cannot treat you if you are pregnant or think you are pregnant • Sexually transmitted diseases are not treated at the clinic.  ° ° °Dental Care: °Organization         Address  Phone  Notes  °[place] [facility] [address], [place] (336) 641-6152 Accepts children up to age 21 who are enrolled in Medicaid or [place] Health Choice; pregnant women with a Medicaid card; and children who have applied for Medicaid or [place] Health Choice, but were declined, whose parents can pay a reduced fee at time of service.  °[place]  [facility] [place]  [address], [place] (336) 641-7733 Accepts children up to age 21 who are enrolled in Medicaid or [place] Health Choice; pregnant women with a Medicaid card; and children who have applied for Medicaid or [place] Health Choice, but were declined, whose parents can pay a reduced fee at time of service.  °[facility] ° [address], [place] (336) 641-4533 Patients are seen by appointment only. Walk-ins are not accepted. [facility] will see patients 18 years of age and older. °Monday - Tuesday (8am-5pm) °Most Wednesdays (8:30-5pm) °$30 per visit, cash only  °[facility] ° [address], [place] (336) 641-4533 Patients are seen by appointment only. Walk-ins are not accepted. [facility] will see patients 18 years of age and older. °One   Wednesday Evening (Monthly: Volunteer Based).  $30 per visit, cash only  °[facility]  (919) 537-3737 for adults; Children under age 4, call Graduate Pediatric Dentistry at (919) [phone]. Children aged 4-14, please call (919) [phone] to request a pediatric application. ° Dental services are provided in all areas of dental care including fillings, crowns and bridges, complete and partial dentures, implants, gum treatment, root canals, and extractions. Preventive care is also provided. Treatment is provided to both adults and children. °Patients are selected via a lottery and there is often a waiting list. °  °[facility] [address], °[place] ° (336) 763-8833 [email or web address] °  °[facility] [address], [place], [place] (336)723-1848, Ext. 123 Second and Fourth Thursday of each month, opens at 6:30 AM; Clinic ends at 9 AM.  Patients are seen on a first-come first-served basis, and a limited number are seen during each clinic.  ° °[facility] ° [address], [place], [place] (336) 723-7904    Eligibility Requirements °You must have lived in [place], or [place] counties for at least the last three months. °  You cannot be eligible for state or federal sponsored healthcare insurance, including Veterans Administration, Medicaid, or Medicare. °  You generally cannot be eligible for healthcare insurance through your employer.  °  How to apply: °Eligibility screenings are held every Tuesday and Wednesday afternoon from 1:00 pm until 4:00 pm. You do not need an appointment for the interview!  °[facility] [address], [place], [place] 336-631-2330   °[facility]  336-342-8273   °[facility]  336-703-3100   °[facility]  336-570-6415   ° °Behavioral Health Resources in the Community: °Intensive Outpatient Programs °Organization         Address  Phone  Notes  °[facility] [address], [place], [place] 336-878-6098   °[facility] [address], [place], [place] 336-832-9800   °[facility] [address], [place], [place] ° 336-882-2125   °[facility] [address],  °[place], [place] 1-800-853-5163 or 336-641-4981   °Substance Abuse Resources °Organization         Address  Phone  Notes  °[facility]  336-882-2125   °[facility]  336-784-9470   °[facility]  336-285-9073   °[facility]  336-845-3988   °Residential & Outpatient Substance Abuse Program  1-800-659-3381   °Psychological Services °Organization         Address  Phone  Notes  °[facility]  336- 832-9600   °[facility]  336- 378-7881   °[facility] [address], [place] 1-800-853-5163 or 336-641-4981   ° °Mobile Crisis Teams °Organization         Address  Phone  Notes  °[facility], Mobile Crisis Care Unit  1-877-626-1772   °Assertive °[facility] ° [address]  [place], [place] 336-834-9664   °Sharon DeEsch [address] °[place] 336-554-5454   ° °Self-Help/Support Groups °Organization         Address  Phone             Notes  °[facility]  - variety of support groups  336- 373-1402 Call for more information  °Narcotics Anonymous (NA), [facility] [address], °[place]  2 meetings at this location  ° °  Residential Treatment Programs °Organization         Address  Phone  Notes  °[facility] [address],    °[place]  1-866-801-8205   °[facility] ° [address], [place], [place] 704-293-8524   °[facility] [address], [place] 336-845-3988 Admissions: 8am-3pm M-F  °[facility] [address]    °[place], [place] 336-841-1104   °[facility] [address], [place], [place] 336-379-7146   °[facility] [address]  °[place], [place] 336-285-9073   °[facility] - Intensive Outpatient [address], [place], [place] 336-852-3033   °[facility] [address]  °[place], [place] 1-877-615-2722 or 336-784-9470   °[facility] ([facility] [address] [place], [place] 336-227-7417 Accepts Medicaid  °[facility] [address]  ° [place] 1-800-659-3381 Substance Abuse/Addiction Treatment  ° °[place] Behavioral Health Resources °Organization         Address  Phone  Notes  °[facility]  (888) 581-9988   °Julie Brannon, PhD [address] [place], [place]   (336) 349-5553 or (336) 951-0000   °[facility]   [address] °[place], [place] (336) 349-4454   °[facility] [address], [place], [place] (336) 342-8316 Insurance/Medicaid/sponsorship through [facility]  °[facility] [address]                                    [place], [place] (336) 342-8316 Therapy/tele-psych/case    °[facility] [address]  ° [place], [place] (336) 349-2233    °Dr. Arfeen  (336) 349-4544   °[facility] [place]  [employer] [place] [facility] 1) [address],  °2) [address], [place] °3)  [address], [place] (336) 349-3220 °(336) 342-7768 ° °(336) 342-8140   °[place] [facility] (336) 342-1394 or (336) 342-3537 (After Hours)    ° ° °Take your usual prescriptions as previously directed.  Call your regular medical doctor and your mental health provider on Monday to schedule a follow up appointment within the next 3 days.  Return to the Emergency Department immediately sooner if worsening.  ° °

## 2014-01-21 NOTE — BH Assessment (Signed)
Received call from Darl PikesSusan, RN for pt. Provided recommendation and notified of No Harm contract being faxed to [facility] for pt signature.  Yaakov Guthrieelilah Stewart, MSW, LCSW Triage Specialist [phone]

## 2014-01-21 NOTE — ED Notes (Signed)
Patient requests to be placed in the waiting room while awaiting [facility].

## 2014-01-21 NOTE — ED Notes (Signed)
Pt brought in by EMS [facility].  "I'm isolating myself, I'm depressed"  Alert, ambulatory.

## 2014-04-25 ENCOUNTER — Emergency Department (HOSPITAL_COMMUNITY)
Admission: EM | Admit: 2014-04-25 | Discharge: 2014-04-26 | Disposition: A | Discharge status: Other Acute Inpatient Hospital | Payer: Medicaid Other | Attending: Emergency Medicine | Admitting: Emergency Medicine

## 2014-04-25 ENCOUNTER — Encounter (HOSPITAL_COMMUNITY): Payer: Self-pay | Admitting: Emergency Medicine

## 2014-04-25 DIAGNOSIS — F17200 Nicotine dependence, unspecified, uncomplicated: Secondary | ICD-10-CM | POA: Insufficient documentation

## 2014-04-25 DIAGNOSIS — K219 Gastro-esophageal reflux disease without esophagitis: Secondary | ICD-10-CM | POA: Insufficient documentation

## 2014-04-25 DIAGNOSIS — H5316 Psychophysical visual disturbances: Secondary | ICD-10-CM | POA: Insufficient documentation

## 2014-04-25 DIAGNOSIS — F319 Bipolar disorder, unspecified: Secondary | ICD-10-CM | POA: Insufficient documentation

## 2014-04-25 DIAGNOSIS — R441 Visual hallucinations: Secondary | ICD-10-CM

## 2014-04-25 DIAGNOSIS — F411 Generalized anxiety disorder: Secondary | ICD-10-CM | POA: Insufficient documentation

## 2014-04-25 DIAGNOSIS — F209 Schizophrenia, unspecified: Secondary | ICD-10-CM | POA: Insufficient documentation

## 2014-04-25 DIAGNOSIS — R45851 Suicidal ideations: Secondary | ICD-10-CM

## 2014-04-25 DIAGNOSIS — F329 Major depressive disorder, single episode, unspecified: Secondary | ICD-10-CM | POA: Diagnosis present

## 2014-04-25 DIAGNOSIS — Z872 Personal history of diseases of the skin and subcutaneous tissue: Secondary | ICD-10-CM | POA: Insufficient documentation

## 2014-04-25 DIAGNOSIS — R440 Auditory hallucinations: Secondary | ICD-10-CM

## 2014-04-25 DIAGNOSIS — I10 Essential (primary) hypertension: Secondary | ICD-10-CM | POA: Insufficient documentation

## 2014-04-25 DIAGNOSIS — Z880 Allergy status to penicillin: Secondary | ICD-10-CM | POA: Insufficient documentation

## 2014-04-25 DIAGNOSIS — G40909 Epilepsy, unspecified, not intractable, without status epilepticus: Secondary | ICD-10-CM | POA: Insufficient documentation

## 2014-04-25 DIAGNOSIS — Z79899 Other long term (current) drug therapy: Secondary | ICD-10-CM | POA: Insufficient documentation

## 2014-04-25 LAB — RAPID URINE DRUG SCREEN, HOSP PERFORMED
Amphetamines: NOT DETECTED
Barbiturates: NOT DETECTED
Benzodiazepines: NOT DETECTED
Cocaine: NOT DETECTED
Opiates: NOT DETECTED
Tetrahydrocannabinol: NOT DETECTED

## 2014-04-25 LAB — CBC
HCT: 45.5 % (ref 39.0–52.0)
Hemoglobin: 15.6 g/dL (ref 13.0–17.0)
MCH: 32 pg (ref 26.0–34.0)
MCHC: 34.3 g/dL (ref 30.0–36.0)
MCV: 93.2 fL (ref 78.0–100.0)
Platelets: 237 10*3/uL (ref 150–400)
RBC: 4.88 MIL/uL (ref 4.22–5.81)
RDW: 13.4 % (ref 11.5–15.5)
WBC: 14.9 10*3/uL — ABNORMAL HIGH (ref 4.0–10.5)

## 2014-04-25 LAB — ACETAMINOPHEN LEVEL: Acetaminophen (Tylenol), Serum: <15 ug/mL (ref 10–30)

## 2014-04-25 LAB — SALICYLATE LEVEL: Salicylate Lvl: <2 mg/dL — ABNORMAL LOW (ref 2.8–20.0)

## 2014-04-25 LAB — COMPREHENSIVE METABOLIC PANEL
ALT: 15 U/L (ref 0–53)
AST: 15 U/L (ref 0–37)
Albumin: 4.5 g/dL (ref 3.5–5.2)
Alkaline Phosphatase: 94 U/L (ref 39–117)
BUN: 9 mg/dL (ref 6–23)
CO2: 23 mEq/L (ref 19–32)
Calcium: 9.5 mg/dL (ref 8.4–10.5)
Chloride: 91 mEq/L — ABNORMAL LOW (ref 96–112)
Creatinine, Ser: 1.17 mg/dL (ref 0.50–1.35)
GFR calc Af Amer: 77 mL/min — ABNORMAL LOW (ref >90)
GFR calc non Af Amer: 67 mL/min — ABNORMAL LOW (ref >90)
Glucose, Bld: 91 mg/dL (ref 70–99)
Potassium: 4.6 mEq/L (ref 3.7–5.3)
Sodium: 128 mEq/L — ABNORMAL LOW (ref 137–147)
Total Bilirubin: 0.6 mg/dL (ref 0.3–1.2)
Total Protein: 6.9 g/dL (ref 6.0–8.3)

## 2014-04-25 LAB — ETHANOL: Alcohol, Ethyl (B): <11 mg/dL (ref 0–11)

## 2014-04-25 MED ORDER — ONDANSETRON HCL 4 MG PO TABS: 4.0000 mg | ORAL_TABLET | Freq: Three times a day (TID) | ORAL | Status: DC | PRN

## 2014-04-25 MED ORDER — ZOLPIDEM TARTRATE 5 MG PO TABS: 5.0000 mg | ORAL_TABLET | Freq: Every evening | ORAL | Status: DC | PRN

## 2014-04-25 MED ORDER — IBUPROFEN 200 MG PO TABS: 600.0000 mg | ORAL_TABLET | Freq: Three times a day (TID) | ORAL | Status: DC | PRN

## 2014-04-25 MED ORDER — NICOTINE 21 MG/24HR TD PT24: 21.0000 mg | MEDICATED_PATCH | Freq: Every day | TRANSDERMAL | Status: DC

## 2014-04-25 MED ORDER — ALUM & MAG HYDROXIDE-SIMETH 200-200-20 MG/5ML PO SUSP: 30.0000 mL | ORAL | Status: DC | PRN

## 2014-04-25 MED ORDER — LORAZEPAM 1 MG PO TABS: 1.0000 mg | ORAL_TABLET | Freq: Three times a day (TID) | ORAL | Status: DC | PRN
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
## 2014-04-25 NOTE — ED Notes (Signed)
Dr. Juleen China contacted and informed of patient Sodium level 128 and chloride 91. Dr. Juleen China stated that he will consult with Johnnette Gourd, PA and call writer back. No new orders received.

## 2014-04-25 NOTE — Progress Notes (Signed)
  CARE MANAGEMENT ED NOTE 04/25/2014  Patient:  MERCEDES, GOODLOW   Account Number:  [id number]  Date Initiated:  04/25/2014  Documentation initiated by:  Radford Pax  Subjective/Objective Assessment:   Patient presents to Ed with SI and auditory hallucinations     Subjective/Objective Assessment Detail:   patient with pmhx  of Bipolar, schizoaffective disorder     Action/Plan:   Action/Plan Detail:   Anticipated DC Date:       Status Recommendation to Physician:   Result of Recommendation:    Other ED Services  Consult Working Plan    DC Planning Services  Other  PCP issues    Choice offered to / List presented to:            Status of service:  Completed, signed off  ED Comments:   ED Comments Detail:  EDCM spoke to patient at bedside  regarding pcp issues. Patient confirms his pcp is Dr. Virgina Organ.  Patientalso confirms he has Medicaid insurnace.  No further EDCM needs at this time.

## 2014-04-25 NOTE — ED Notes (Signed)
Bed: HUD14 Expected date:  Expected time:  Means of arrival:  Comments: Triage 2

## 2014-04-25 NOTE — ED Notes (Addendum)
Pt brought in by [facility]. Pt c/o SI and auditory hallucinations, Pt also state she feels like someone is out to get him. Pt states he had plan of getting box cutter and cutting arm, states he has box cutter hid.

## 2014-04-25 NOTE — ED Notes (Signed)
Patient arrived on the unit just prior to dinner.  Tray given.  States he has been drinking heavily and not eating for about a month.  Patient instructed to eat slowly and to only take small amounts until his body has been re-acclimated to eating.  Reminded him that we had crackers, cheese, applesauce, broth and soda available if he wanted something later.  Patient is pleasant and cooperative at this time.

## 2014-04-25 NOTE — BH Assessment (Addendum)
Assessment completed. Consulted with Jason Sievert, PA who recommended inpatient treatment. Pt is not appropriate for [facility] due to history of TBI.  Jason Patterson. Jason Patterson has been notified of the recommendations. Pt's legal guardian Jason Patterson has been notified of recommendations as well.
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
## 2014-04-26 ENCOUNTER — Encounter (HOSPITAL_COMMUNITY): Payer: Self-pay | Admitting: *Deleted

## 2014-04-26 ENCOUNTER — Inpatient Hospital Stay (HOSPITAL_BASED_OUTPATIENT_CLINIC_OR_DEPARTMENT_OTHER)
Admission: AD | Admit: 2014-04-26 | Discharge: 2014-05-01 | Disposition: A | Discharge status: Home / Self Care | Payer: Medicaid Other | Source: Intra-hospital | Attending: Psychiatry | Admitting: Psychiatry

## 2014-04-26 DIAGNOSIS — F323 Major depressive disorder, single episode, severe with psychotic features: Secondary | ICD-10-CM | POA: Diagnosis present

## 2014-04-26 DIAGNOSIS — K219 Gastro-esophageal reflux disease without esophagitis: Secondary | ICD-10-CM | POA: Diagnosis present

## 2014-04-26 DIAGNOSIS — G40909 Epilepsy, unspecified, not intractable, without status epilepticus: Secondary | ICD-10-CM | POA: Diagnosis present

## 2014-04-26 DIAGNOSIS — R0789 Other chest pain: Secondary | ICD-10-CM | POA: Diagnosis present

## 2014-04-26 DIAGNOSIS — E559 Vitamin D deficiency, unspecified: Secondary | ICD-10-CM | POA: Diagnosis present

## 2014-04-26 DIAGNOSIS — F259 Schizoaffective disorder, unspecified: Secondary | ICD-10-CM | POA: Diagnosis present

## 2014-04-26 DIAGNOSIS — F411 Generalized anxiety disorder: Secondary | ICD-10-CM | POA: Diagnosis present

## 2014-04-26 DIAGNOSIS — G4700 Insomnia, unspecified: Secondary | ICD-10-CM | POA: Diagnosis present

## 2014-04-26 DIAGNOSIS — F329 Major depressive disorder, single episode, unspecified: Secondary | ICD-10-CM | POA: Diagnosis present

## 2014-04-26 DIAGNOSIS — R45851 Suicidal ideations: Secondary | ICD-10-CM | POA: Diagnosis not present

## 2014-04-26 DIAGNOSIS — I10 Essential (primary) hypertension: Secondary | ICD-10-CM | POA: Diagnosis present

## 2014-04-26 DIAGNOSIS — F319 Bipolar disorder, unspecified: Secondary | ICD-10-CM | POA: Diagnosis present

## 2014-04-26 DIAGNOSIS — R079 Chest pain, unspecified: Secondary | ICD-10-CM | POA: Diagnosis present

## 2014-04-26 DIAGNOSIS — J449 Chronic obstructive pulmonary disease, unspecified: Secondary | ICD-10-CM | POA: Diagnosis present

## 2014-04-26 DIAGNOSIS — F250 Schizoaffective disorder, bipolar type: Secondary | ICD-10-CM | POA: Diagnosis present

## 2014-04-26 DIAGNOSIS — F17200 Nicotine dependence, unspecified, uncomplicated: Secondary | ICD-10-CM | POA: Diagnosis present

## 2014-04-26 LAB — BASIC METABOLIC PANEL
BUN: 12 mg/dL (ref 6–23)
CALCIUM: 10.1 mg/dL (ref 8.4–10.5)
CO2: 24 meq/L (ref 19–32)
Chloride: 102 mEq/L (ref 96–112)
Creatinine, Ser: 1.1 mg/dL (ref 0.50–1.35)
GFR calc Af Amer: 83 mL/min — ABNORMAL LOW (ref >90)
GFR, EST NON AFRICAN AMERICAN: 72 mL/min — AB (ref >90)
GLUCOSE: 109 mg/dL — AB (ref 70–99)
Potassium: 4.3 mEq/L (ref 3.7–5.3)
Sodium: 137 mEq/L (ref 137–147)

## 2014-04-26 LAB — CBC WITH DIFFERENTIAL/PLATELET
Basophils Absolute: 0 10*3/uL (ref 0.0–0.1)
Basophils Relative: 0 % (ref 0–1)
EOS ABS: 0.3 10*3/uL (ref 0.0–0.7)
EOS PCT: 3 % (ref 0–5)
HCT: 47.8 % (ref 39.0–52.0)
HEMOGLOBIN: 16.1 g/dL (ref 13.0–17.0)
LYMPHS ABS: 2 10*3/uL (ref 0.7–4.0)
LYMPHS PCT: 20 % (ref 12–46)
MCH: 31.3 pg (ref 26.0–34.0)
MCHC: 33.7 g/dL (ref 30.0–36.0)
MCV: 93 fL (ref 78.0–100.0)
MONOS PCT: 6 % (ref 3–12)
Monocytes Absolute: 0.6 10*3/uL (ref 0.1–1.0)
Neutro Abs: 7.2 10*3/uL (ref 1.7–7.7)
Neutrophils Relative %: 71 % (ref 43–77)
Platelets: 245 10*3/uL (ref 150–400)
RBC: 5.14 MIL/uL (ref 4.22–5.81)
RDW: 13.5 % (ref 11.5–15.5)
WBC: 10.1 10*3/uL (ref 4.0–10.5)

## 2014-04-26 MED ORDER — ARIPIPRAZOLE ER 400 MG IM SUSR
400.0000 mg | INTRAMUSCULAR | Status: DC
  Filled 2014-04-26: qty 400

## 2014-04-26 MED ORDER — TOPIRAMATE 25 MG PO TABS
50.0000 mg | ORAL_TABLET | Freq: Every day | ORAL | Status: DC
  Administered 2014-04-26: 50 mg via ORAL
  Filled 2014-04-26: qty 2

## 2014-04-26 MED ORDER — LITHIUM CARBONATE 300 MG PO CAPS
600.0000 mg | ORAL_CAPSULE | Freq: Two times a day (BID) | ORAL | Status: DC
  Administered 2014-04-26 – 2014-05-01 (×10): 600 mg via ORAL
  Filled 2014-04-26 (×14): qty 2

## 2014-04-26 MED ORDER — ZOLPIDEM TARTRATE 10 MG PO TABS: 20.0000 mg | ORAL_TABLET | Freq: Every evening | ORAL | Status: DC | PRN

## 2014-04-26 MED ORDER — ASENAPINE MALEATE 5 MG SL SUBL: 10.0000 mg | SUBLINGUAL_TABLET | Freq: Two times a day (BID) | SUBLINGUAL | Status: DC | PRN

## 2014-04-26 MED ORDER — DICLOFENAC SODIUM 1 % TD GEL
1.0000 "application " | Freq: Four times a day (QID) | TRANSDERMAL | Status: DC
  Administered 2014-04-26: 1 via TOPICAL
  Filled 2014-04-26: qty 100

## 2014-04-26 MED ORDER — ALUM & MAG HYDROXIDE-SIMETH 200-200-20 MG/5ML PO SUSP: 30.0000 mL | ORAL | Status: DC | PRN

## 2014-04-26 MED ORDER — CLONIDINE HCL 0.1 MG PO TABS
0.1000 mg | ORAL_TABLET | Freq: Two times a day (BID) | ORAL | Status: DC
  Administered 2014-04-26 – 2014-05-01 (×10): 0.1 mg via ORAL
  Filled 2014-04-26 (×14): qty 1

## 2014-04-26 MED ORDER — ARIPIPRAZOLE 5 MG PO TABS
5.0000 mg | ORAL_TABLET | Freq: Every day | ORAL | Status: DC
  Filled 2014-04-26: qty 1

## 2014-04-26 MED ORDER — TAMSULOSIN HCL 0.4 MG PO CAPS
0.4000 mg | ORAL_CAPSULE | Freq: Every morning | ORAL | Status: DC
  Administered 2014-04-27 – 2014-05-01 (×5): 0.4 mg via ORAL
  Filled 2014-04-26 (×6): qty 1

## 2014-04-26 MED ORDER — TIOTROPIUM BROMIDE MONOHYDRATE 18 MCG IN CAPS
18.0000 ug | ORAL_CAPSULE | Freq: Every day | RESPIRATORY_TRACT | Status: DC
  Administered 2014-04-27 – 2014-05-01 (×5): 18 ug via RESPIRATORY_TRACT
  Filled 2014-04-26: qty 5

## 2014-04-26 MED ORDER — LORATADINE 10 MG PO TABS
10.0000 mg | ORAL_TABLET | Freq: Every day | ORAL | Status: DC
  Administered 2014-04-27 – 2014-05-01 (×5): 10 mg via ORAL
  Filled 2014-04-26 (×8): qty 1

## 2014-04-26 MED ORDER — TRAZODONE HCL 50 MG PO TABS
50.0000 mg | ORAL_TABLET | Freq: Every evening | ORAL | Status: DC | PRN
  Administered 2014-04-27 – 2014-04-30 (×4): 50 mg via ORAL
  Filled 2014-04-26 (×3): qty 1

## 2014-04-26 MED ORDER — LORATADINE 10 MG PO TABS
10.0000 mg | ORAL_TABLET | Freq: Every day | ORAL | Status: DC
  Administered 2014-04-26: 10 mg via ORAL
  Filled 2014-04-26: qty 1

## 2014-04-26 MED ORDER — TRAMADOL HCL 50 MG PO TABS
50.0000 mg | ORAL_TABLET | Freq: Four times a day (QID) | ORAL | Status: DC | PRN
  Administered 2014-04-26: 100 mg via ORAL
  Filled 2014-04-26: qty 2

## 2014-04-26 MED ORDER — ALBUTEROL SULFATE HFA 108 (90 BASE) MCG/ACT IN AERS: 2.0000 | INHALATION_SPRAY | Freq: Four times a day (QID) | RESPIRATORY_TRACT | Status: DC | PRN

## 2014-04-26 MED ORDER — CLONIDINE HCL 0.1 MG PO TABS
0.1000 mg | ORAL_TABLET | Freq: Two times a day (BID) | ORAL | Status: DC
  Administered 2014-04-26: 0.1 mg via ORAL
  Filled 2014-04-26: qty 1

## 2014-04-26 MED ORDER — MAGNESIUM HYDROXIDE 400 MG/5ML PO SUSP: 30.0000 mL | Freq: Every day | ORAL | Status: DC | PRN

## 2014-04-26 MED ORDER — CLONAZEPAM 0.5 MG PO TABS
1.0000 mg | ORAL_TABLET | Freq: Two times a day (BID) | ORAL | Status: DC
  Administered 2014-04-26 – 2014-05-01 (×10): 1 mg via ORAL
  Filled 2014-04-26 (×10): qty 1

## 2014-04-26 MED ORDER — MOMETASONE FURO-FORMOTEROL FUM 200-5 MCG/ACT IN AERO
2.0000 | INHALATION_SPRAY | Freq: Two times a day (BID) | RESPIRATORY_TRACT | Status: DC
  Administered 2014-04-26 – 2014-05-01 (×9): 2 via RESPIRATORY_TRACT
  Filled 2014-04-26 (×2): qty 8.8

## 2014-04-26 MED ORDER — MAGNESIUM HYDROXIDE 400 MG/5ML PO SUSP
30.0000 mL | Freq: Every day | ORAL | Status: DC | PRN
  Filled 2014-04-26: qty 30

## 2014-04-26 MED ORDER — ARIPIPRAZOLE ER 400 MG IM SUSR: 400.0000 mg | INTRAMUSCULAR | Status: DC

## 2014-04-26 MED ORDER — GABAPENTIN 300 MG PO CAPS
300.0000 mg | ORAL_CAPSULE | Freq: Two times a day (BID) | ORAL | Status: DC
  Administered 2014-04-26 – 2014-05-01 (×10): 300 mg via ORAL
  Filled 2014-04-26 (×14): qty 1

## 2014-04-26 MED ORDER — ERGOCALCIFEROL 1.25 MG (50000 UT) PO CAPS
50000.0000 [IU] | ORAL_CAPSULE | ORAL | Status: DC
  Administered 2014-04-27: 50000 [IU] via ORAL
  Filled 2014-04-26: qty 1

## 2014-04-26 MED ORDER — PANTOPRAZOLE SODIUM 40 MG PO TBEC
40.0000 mg | DELAYED_RELEASE_TABLET | Freq: Every day | ORAL | Status: DC
  Administered 2014-04-27 – 2014-05-01 (×5): 40 mg via ORAL
  Filled 2014-04-26 (×7): qty 1

## 2014-04-26 MED ORDER — ASENAPINE MALEATE 5 MG SL SUBL
10.0000 mg | SUBLINGUAL_TABLET | Freq: Two times a day (BID) | SUBLINGUAL | Status: DC | PRN
  Administered 2014-04-30: 10 mg via SUBLINGUAL
  Filled 2014-04-26 (×2): qty 2

## 2014-04-26 MED ORDER — LACTULOSE 10 GM/15ML PO SOLN
10.0000 g | Freq: Every day | ORAL | Status: DC
  Administered 2014-04-26: 10 g via ORAL
  Filled 2014-04-26: qty 15

## 2014-04-26 MED ORDER — ACETAMINOPHEN 325 MG PO TABS: 650.0000 mg | ORAL_TABLET | Freq: Four times a day (QID) | ORAL | Status: DC | PRN

## 2014-04-26 MED ORDER — VITAMIN D (ERGOCALCIFEROL) 1.25 MG (50000 UNIT) PO CAPS: 50000.0000 [IU] | ORAL_CAPSULE | ORAL | Status: DC

## 2014-04-26 MED ORDER — LACTULOSE 10 GM/15ML PO SOLN
10.0000 g | Freq: Every day | ORAL | Status: DC
  Administered 2014-05-01: 10 g via ORAL
  Filled 2014-04-26 (×7): qty 15

## 2014-04-26 MED ORDER — GABAPENTIN 300 MG PO CAPS
300.0000 mg | ORAL_CAPSULE | Freq: Two times a day (BID) | ORAL | Status: DC
  Administered 2014-04-26: 300 mg via ORAL
  Filled 2014-04-26: qty 1

## 2014-04-26 MED ORDER — CLONAZEPAM 1 MG PO TABS: 1.0000 mg | ORAL_TABLET | Freq: Two times a day (BID) | ORAL | Status: DC | PRN

## 2014-04-26 MED ORDER — TRAZODONE HCL 50 MG PO TABS: 50.0000 mg | ORAL_TABLET | Freq: Every evening | ORAL | Status: DC | PRN

## 2014-04-26 MED ORDER — TIOTROPIUM BROMIDE MONOHYDRATE 18 MCG IN CAPS
18.0000 ug | ORAL_CAPSULE | Freq: Every day | RESPIRATORY_TRACT | Status: DC
  Administered 2014-04-26: 18 ug via RESPIRATORY_TRACT
  Filled 2014-04-26: qty 5

## 2014-04-26 MED ORDER — LITHIUM CARBONATE 300 MG PO CAPS
600.0000 mg | ORAL_CAPSULE | Freq: Two times a day (BID) | ORAL | Status: DC
  Administered 2014-04-26: 600 mg via ORAL
  Filled 2014-04-26: qty 2

## 2014-04-26 MED ORDER — TOPIRAMATE 25 MG PO TABS
50.0000 mg | ORAL_TABLET | Freq: Every day | ORAL | Status: DC
  Administered 2014-04-27 – 2014-05-01 (×5): 50 mg via ORAL
  Filled 2014-04-26 (×7): qty 2

## 2014-04-26 MED ORDER — TAMSULOSIN HCL 0.4 MG PO CAPS
0.4000 mg | ORAL_CAPSULE | Freq: Every morning | ORAL | Status: DC
  Administered 2014-04-26: 0.4 mg via ORAL
  Filled 2014-04-26: qty 1

## 2014-04-26 MED ORDER — MOMETASONE FURO-FORMOTEROL FUM 200-5 MCG/ACT IN AERO
2.0000 | INHALATION_SPRAY | Freq: Two times a day (BID) | RESPIRATORY_TRACT | Status: DC
  Administered 2014-04-26: 2 via RESPIRATORY_TRACT
  Filled 2014-04-26: qty 8.8

## 2014-04-26 MED ORDER — ALUM & MAG HYDROXIDE-SIMETH 200-200-20 MG/5ML PO SUSP
30.0000 mL | ORAL | Status: DC | PRN
  Administered 2014-04-26 – 2014-05-01 (×3): 30 mL via ORAL

## 2014-04-26 MED ORDER — ACETAMINOPHEN 325 MG PO TABS
650.0000 mg | ORAL_TABLET | Freq: Four times a day (QID) | ORAL | Status: DC | PRN
  Administered 2014-04-30: 650 mg via ORAL
  Filled 2014-04-26: qty 2

## 2014-04-26 MED ORDER — TRAMADOL HCL 50 MG PO TABS
50.0000 mg | ORAL_TABLET | Freq: Four times a day (QID) | ORAL | Status: DC | PRN
  Administered 2014-04-27 – 2014-05-01 (×11): 100 mg via ORAL
  Filled 2014-04-26 (×5): qty 2
  Filled 2014-04-26: qty 1
  Filled 2014-04-26: qty 2
  Filled 2014-04-26: qty 1
  Filled 2014-04-26 (×3): qty 2
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
## 2014-04-26 NOTE — ED Notes (Signed)
Pt ambulates without difficulty.  Pt able to perform ADL independantly.
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
## 2014-04-26 NOTE — ED Notes (Signed)
Patient discharge via ambulatory with a steady gait. No acute distress noted. 

## 2014-04-26 NOTE — ED Notes (Signed)
Writer informed that patient will not be transferred to [facility] due that TTS counselor discover patient has history of TBI. Patient informed and vebralized understanding. Encouragement and support provided and safety maintain.

## 2014-04-26 NOTE — ED Notes (Signed)
[name], [facility] requested that patient be held until 9:00 pm due changes that had to be made in staffing assignments. Writer verbalized understanding.

## 2014-04-26 NOTE — ED Notes (Signed)
Pt upset about dinner.  Pt does not like his meal.  Pt offered snacks and sandwich.  Pt became agitated and hit the wall "I am not worth it, please don't go out your way for me."  Pt went back in room and began crying and hit the wall.  Pt calmed down after talking with staff.  Pt currently resting in bed at this time.

## 2014-04-26 NOTE — ED Provider Notes (Signed)
Medical screening examination/treatment/procedure(s) were performed by non-physician practitioner and as supervising physician I was immediately available for consultation/collaboration.   EKG Interpretation None       Melvine Julin, MD 04/26/14 0058 

## 2014-04-26 NOTE — BH Assessment (Signed)
TTS spoke with pt's legal guardian Jason Patterson who confirms that she is pt's legal guardian. Pt's legal guardianship paper work along with Clinical note from pt's psychiatrist Dr. Omelia Blackwater providing clinical information,diagnosis, and additional information in pt's chart.   Glorious Peach, MS, LCASA Assessment Counselor

## 2014-04-26 NOTE — BH Assessment (Addendum)
Spoke with Morey Hummingbird from guardian agency and admission and consent form faxed and signed by Alcario Drought Fears whom is also listed as a guardian for patient on his guardianship paperwork and give written consent for pt inpatient treatment.  Pt nurse has the guardianship paperwork and has been notifed of pt's acceptance to [facility]. Patient's  nurse will call report prior to pt transfer to [facility].  Glorious Peach, MS, LCASA Assessment Counselor
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
## 2014-04-26 NOTE — BH Assessment (Signed)
Pt evaluated and accepted to [facility] by [facility] and Dr.Taylor. Pt accepted to [facility] assigned to bed 502-1.  Guardian was notified and a copy of Voluntary consent was faxed to guardian to be signed and faxed back by guardian Materials engineer at [employer].  TTS left message to confirm receipt of voluntary consent form. Awaiting call back from guardian and receipt of voluntary form.   Glorious Peach, MS, LCASA Assessment Counselor

## 2014-04-27 ENCOUNTER — Encounter (HOSPITAL_COMMUNITY): Payer: Self-pay | Admitting: Psychiatry

## 2014-04-27 DIAGNOSIS — R45851 Suicidal ideations: Principal | ICD-10-CM

## 2014-04-27 DIAGNOSIS — F3130 Bipolar disorder, current episode depressed, mild or moderate severity, unspecified: Secondary | ICD-10-CM

## 2014-04-27 LAB — LIPID PANEL
Cholesterol: 107 mg/dL (ref 0–200)
HDL: 33 mg/dL — AB (ref >39)
LDL CALC: 53 mg/dL (ref 0–99)
Total CHOL/HDL Ratio: 3.2 RATIO
Triglycerides: 107 mg/dL (ref <150)
VLDL: 21 mg/dL (ref 0–40)

## 2014-04-27 LAB — LITHIUM LEVEL: Lithium Lvl: 1.08 mEq/L (ref 0.80–1.40)

## 2014-04-27 LAB — TSH: TSH: 0.998 u[IU]/mL (ref 0.350–4.500)

## 2014-04-27 LAB — HEMOGLOBIN A1C
HEMOGLOBIN A1C: 5.5 % (ref <5.7)
Mean Plasma Glucose: 111 mg/dL (ref <117)

## 2014-04-27 MED ORDER — ARIPIPRAZOLE ER 400 MG IM SUSR: 400.0000 mg | INTRAMUSCULAR | Status: DC

## 2014-04-27 NOTE — BHH Suicide Risk Assessment (Signed)
Suicide Risk Assessment  Admission Assessment     Nursing information obtained from:  Patient Demographic factors:  Male;Caucasian Current Mental Status:  Self-harm thoughts Loss Factors:  NA Historical Factors:  Prior suicide attempts Risk Reduction Factors:  NA Total Time spent with patient: 45 minutes  CLINICAL FACTORS:   Bipolar Disorder:   Depressive phase Medical Diagnoses and Treatments/Surgeries  Psychiatric Specialty Exam:     Blood pressure 105/70, pulse 68, temperature 98.1 F (36.7 C), temperature source Oral, resp. rate 18, height 5\' 7"  (1.702 m), weight 89.812 kg (198 lb).Body mass index is 31 kg/(m^2).  General Appearance: Disheveled  Eye Solicitor::  Fair  Speech:  Clear and Coherent and Pressured  Volume:  fluctuates  Mood:  Anxious, Depressed and Irritable  Affect:  Labile  Thought Process:  Circumstantial and Coherent  Orientation:  Full (Time, Place, and Person)  Thought Content:  events at the [facility], his response, symptoms, worries, concerns  Suicidal Thoughts:  Yes, no plans or intent  Homicidal Thoughts:  No  Memory:  Immediate;   Fair Recent;   Fair Remote;   Fair  Judgement:  Fair  Insight:  Shallow  Psychomotor Activity:  Restlessness  Concentration:  Fair  Recall:  [employer] of Knowledge:NA  Language: Fair  Akathisia:  No  Handed:    AIMS (if indicated):     Assets:  Desire for Improvement  Sleep:  Number of Hours: 6   Musculoskeletal: Strength & Muscle Tone: within normal limits Gait & Station: normal Patient leans: N/A  COGNITIVE FEATURES THAT CONTRIBUTE TO RISK:  Closed-mindedness Polarized thinking Thought constriction (tunnel vision)    SUICIDE RISK:   Moderate:  Frequent suicidal ideation with limited intensity, and duration, some specificity in terms of plans, no associated intent, good self-control, limited dysphoria/symptomatology, some risk factors present, and identifiable protective factors, including available and  accessible social support.  PLAN OF CARE: Supportive approach/coping skills/relapse prevention                              Reassess and optimize treatment with psychotropics  I certify that inpatient services furnished can reasonably be expected to improve the patient's condition.  Shirrell Solinger A 04/27/2014, 3:57 PM

## 2014-04-27 NOTE — Progress Notes (Signed)
D: Patient appropriate and cooperative with staff and peers. Patient has depressed affect and mood. He reported on the self inventory sheet that he's sleeping fair, appetite is improving, energy level is normal and ability to pay attention is good. Patient rates depression and feelings of hopelessness "10". He's attending all groups. Patient adheres to medication regimen.  A: Support and encouragement provided to patient. Administered scheduled medications per ordering MD. Monitor Q15 minute checks for safety.  R: Patient receptive. Passive SI, but contracts for safety. Denies HI/AVH. Patient remains safe on the unit.
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
## 2014-04-27 NOTE — Progress Notes (Signed)
Adult Psychoeducational Group Note  Date:  04/27/2014 Time:  11:30 PM  Group Topic/Focus:  Wrap-Up Group:   The focus of this group is to help patients review their daily goal of treatment and discuss progress on daily workbooks.  Participation Level:  Active  Participation Quality:  Appropriate  Affect:  Appropriate  Cognitive:  Appropriate  Insight: Appropriate  Engagement in Group:  Engaged  Modes of Intervention:  Support  Additional Comments:  Pt stated that he really likes this hospital because he doesn't feel like he is an experiment. Pt stated that he like being able to communicate with his peers  Fabiha Rougeau 04/27/2014, 11:30 PM

## 2014-04-27 NOTE — Progress Notes (Signed)
Patient ID: Jason Patterson, male   DOB: 1956/11/15, 58 y.o.   MRN: [id number] D: pt. Reports pain in left shoulder, "10" of 10, talks about rest home he is currently a pt. "It's the pits" says he is at "[facility], I wish somebody would turn them in without them knowing it, wish somebody would come a stay a day or two and see how things are done" A: Writer introduced self to client, listens providing emotional support, encouraged to speak with SW about discharge possibilities. Staff will monitor q52min for safety. R: Pt. Is safe on the unit, attends group.

## 2014-04-27 NOTE — Tx Team (Signed)
Initial Interdisciplinary Treatment Plan  PATIENT STRENGTHS: (choose at least two) Motivation for treatment/growth  PATIENT STRESSORS: Traumatic event   PROBLEM LIST: Problem List/Patient Goals Date to be addressed Date deferred Reason deferred Estimated date of resolution  Depression 04/27/14                                                      DISCHARGE CRITERIA:  Improved stabilization in mood, thinking, and/or behavior Motivation to continue treatment in a less acute level of care Verbal commitment to aftercare and medication compliance  PRELIMINARY DISCHARGE PLAN: Placement in alternative living arrangements  PATIENT/FAMIILY INVOLVEMENT: This treatment plan has been presented to and reviewed with the patient, Jason Patterson, and/or family member.  The patient and family have been given the opportunity to ask questions and make suggestions.  Roselie Skinner [facility] 04/27/2014, 2:37 AM

## 2014-04-27 NOTE — BHH Group Notes (Signed)
[facility] LCSW Aftercare Discharge Planning Group Note   04/27/2014 8:45 AM  Participation Quality:  Alert, Appropriate and Oriented  Mood/Affect:  Flat and Depressed  Depression Rating:  5  Anxiety Rating:  7  Thoughts of Suicide:  Pt denies HI, endorses off and on SI  Will you contract for safety?   Yes  Current AVH:  Pt denies  Plan for Discharge/Comments:  Pt attended discharge planning group and actively participated in group.  CSW provided pt with today's workbook.  Pt reports being in pain and depressed today.  Pt states that he was residing in a rest home in [place] but is hopeful to move to a different group home.  Pt states that he has guardians and has follow up scheduled with V[facility]ton for ACT team services.  No further needs voiced by pt at this time.    Transportation Means: Pt reports access to transportation   Supports: No supports mentioned at this time  Jason Ivan, LCSW 04/27/2014 9:58 AM
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
## 2014-04-27 NOTE — [facility] Group Notes (Signed)
[facility] LCSW Group Therapy  04/27/2014  1:15 PM   Type of Therapy:  Group Therapy  Participation Level:  Active  Participation Quality:  Attentive, Sharing and Supportive  Affect:  Depressed and Flat  Cognitive:  Alert and Oriented  Insight:  Developing/Improving and Engaged  Engagement in Therapy:  Developing/Improving and Engaged  Modes of Intervention:  Clarification, Confrontation, Discussion, Education, Exploration, Limit-setting, Orientation, Problem-solving, Rapport Building, Dance movement psychotherapist, Socialization and Support  Summary of Progress/Problems: The topic for group today was emotional regulation.  This group focused on both positive and negative emotion identification and allowed group members to process ways to identify feelings, regulate negative emotions, and find healthy ways to manage internal/external emotions. Group members were asked to reflect on a time when their reaction to an emotion led to a negative outcome and explored how alternative responses using emotion regulation would have benefited them. Group members were also asked to discuss a time when emotion regulation was utilized when a negative emotion was experienced.  Pt shared how he feels the devil is responsible for drug use and depression and uses his faith to combat depression.  Pt attempted to be supportive to peers but had a hard time connecting to peers.  Pt actively listened to group discussion.    Reyes Ivan, LCSW 04/27/2014  3:20 PM

## 2014-04-28 NOTE — Progress Notes (Signed)
Patient ID: Jason Patterson, male   DOB: [birth year], 58 y.o.   MRN: [id number] D: pt. Reports pain at "8" of 10, depression "5" of 10, notes he thinks of his family who was killed in accident years ago.  A: Writer provided emotional support administered Tramadol 100 mg po. Staff encourage group, will monitor q89min for safety. R: pt. Is safe on unit, attended karaoke.

## 2014-04-28 NOTE — [facility] Group Notes (Signed)
[facility] LCSW Group Therapy  [facility] 1:15 - 2:30 PM  04/28/2014   Type of Therapy:  Group Therapy  Participation Level: Active  Participation Quality:  Attentive  Affect:  Appropriate  Cognitive:  Appropriate  Insight:  Developing/Improving and Engaged  Engagement in Therapy:  Developing/Improving Engaged  Modes of Intervention:  Discussion, Education, Exploration, Problem-Solving, Rapport Building, Support   Summary of Progress/Problems:  Patient listened attentively to speaker from [facility].  Patient listened attentively and nodded in agreement with others.  He thanked the speaker for sharing his story with the group.     Wynn Banker 04/28/2014

## 2014-04-28 NOTE — Progress Notes (Signed)
Adult Psychoeducational Group Note  Date:  04/28/2014 Time:  10:00AM  Group Topic/Focus:  Overcoming Stress:   The focus of this group is to define stress and help patients assess their triggers.  Participation Level:  Active  Participation Quality:  Appropriate  Affect:  Appropriate  Cognitive:  Appropriate  Insight: Appropriate  Engagement in Group:  Engaged  Modes of Intervention:  Discussion  Additional Comments:  Pt was engaged with his group on coming up with 5 stressors/triggers. A stressor he came up with was loud noise when trying to get rest or sleep. Stated that he likes to listen to music as a coping skill.  Hortencia Pilar, Casie Sturgeon E 04/28/2014, 10:38 AM

## 2014-04-28 NOTE — Progress Notes (Signed)
D: Patient has appropriate affect and anxious mood. He reported on the self inventory sheet that he's sleeping fair, appetite is improving, normal energy level and ability to pay attention is good. Patient rates depression "8" and feelings of hopelessness "9". He's actively participating in the group sessions. Patient compliant with all medications.  A: Support and encouragement provided to patient. Scheduled medications administered per MD orders. Maintain Q15 minute checks for safety.  R: Patient receptive. Passive SI, but contracts for safety. Denies HI. Patient remains safe.

## 2014-04-28 NOTE — Progress Notes (Signed)
Adult Psychoeducational Group Note  Date:  04/28/2014 Time:  9:00 AM  Group Topic/Focus:  Morning Wellness Group  Participation Level:  Active  Participation Quality:  Appropriate and Attentive  Affect:  Appropriate  Cognitive:  Alert and Oriented  Insight: Appropriate  Engagement in Group:  Engaged  Modes of Intervention:  Discussion  Additional Comments: Patient voiced that he would like to go fishing in his leisure time.  Harold Barban E 04/28/2014, 4:28 PM
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
## 2014-04-29 LAB — GABAPENTIN LEVEL: Gabapentin Lvl: 2.1 ug/mL
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
## 2014-04-29 NOTE — Progress Notes (Signed)
Adult Psychoeducational Group Note  Date:  04/29/2014 Time:  9:03 PM  Group Topic/Focus:  Wrap-Up Group:   The focus of this group is to help patients review their daily goal of treatment and discuss progress on daily workbooks.  Participation Level:  Active  Participation Quality:  Appropriate, Attentive and Sharing  Affect:  Flat  Cognitive:  Alert and Disorganized  Insight: Lacking and Limited  Engagement in Group:  Engaged  Modes of Intervention:  Discussion, Education, Exploration, Socialization and Support  Additional Comments:  Pt came to group and shared that he was upset with the news that he might be returning to his group home and was upset at the accusations that he was cashing other people's social security checks. Pt was encouraged to keep working with the social workers. Pt also shared that he wants to quit smoking when he leaves the hospital.    Cathlean CowerClouse, Jaylen Knope Y 04/29/2014, 9:03 PM
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
## 2014-04-29 NOTE — Progress Notes (Signed)
Patient ID: Jason Patterson, male   DOB: August 02, 1956, 58 y.o.   MRN: [id number] Pt visible in the milieu.  Interacting appropriately with staff and peers.  Attending group.  Pt reported he was happy because he has made friends here.  Pt stated he does not have friends outside of the facility.  Support provided. Pt receptive.  Needs assessed.  Pt denied.  Fifteen minute checks in progress for patient safety.  Pt safe on unit.

## 2014-04-29 NOTE — Progress Notes (Signed)
D: Patient's affect appropriate to circumstance and mood is anxious. He reported on the self inventory sheet that he's sleeping poorly, appetite and ability to pay attention are both good and energy level is normal. Patient rates depression "8" and feelings of hopelessness "9". He's attending all groups throughout the day. Adheres to current medication regimen.   A: Support and encouragement provided to patient. Administered scheduled medications per ordering MD. Monitor Q15 minute checks for safety.  R: Patient receptive. Passive SI, but contracts for safety. Denies HI and auditory/visual hallucinations. Patient remains safe on the unit.

## 2014-04-29 NOTE — [facility] Group Notes (Signed)
[facility] LCSW Group Therapy  04/29/2014  1:15 PM   Type of Therapy:  Group Therapy  Participation Level:  Active  Participation Quality:  Attentive, Sharing and Supportive  Affect:  Depressed and Flat  Cognitive:  Alert and Oriented  Insight:  Developing/Improving and Engaged  Engagement in Therapy:  Developing/Improving and Engaged  Modes of Intervention:  Clarification, Confrontation, Discussion, Education, Exploration, Limit-setting, Orientation, Problem-solving, Rapport Building, Dance movement psychotherapisteality Testing, Socialization and Support  Summary of Progress/Problems: The topic for today was feelings about relapse.  Pt discussed what relapse prevention is to them and identified triggers that they are on the path to relapse.  Pt processed their feeling towards relapse and was able to relate to peers.  Pt discussed coping skills that can be used for relapse prevention.  Pt was initially focused on talking about what his guardian said about him but was redirected to talk further with CSW after group on this.  Pt actively listened to group discussion but did not participate personally.    Jason IvanChelsea Horton, LCSW 04/29/2014  3:08 PM

## 2014-04-29 NOTE — Tx Team (Signed)
Interdisciplinary Treatment Plan Update (Adult)  Date: 04/29/2014  Time Reviewed:  9:45 AM  Progress in Treatment: Attending groups: Yes Participating in groups:  Yes Taking medication as prescribed:  Yes Tolerating medication:  Yes Family/Significant othe contact made: No, attempting to talk to pt's guardian Patient understands diagnosis:  Yes Discussing patient identified problems/goals with staff:  Yes Medical problems stabilized or resolved:  Yes Denies suicidal/homicidal ideation: Yes Issues/concerns per patient self-inventory:  Yes Other:  New problem(s) identified: N/A  Discharge Plan or Barriers: Pt has follow [facility] at [facility] for [facility] team services.    Reason for Continuation of Hospitalization: Anxiety Depression Medication Stabilization  Comments: N/A  Estimated length of stay: 3-4 days   For review of initial/current patient goals, please see plan of care.  Attendees: Patient:      Family:     Physician:  Dr. Jama Flavorsobos 04/29/2014 10:09 AM   Nursing:   Harold Barbanonecia Byrd, RN 04/29/2014 10:09 AM   Clinical Social Worker:  Reyes Ivanhelsea Horton, LCSW 04/29/2014 10:09 AM   Other: Shelda JakesPatty Duke, RN 04/29/2014 10:09 AM   Other:  Sherrye PayorValerie Noch, care coordination 04/29/2014 10:09 AM   Other:  Juline PatchQuylle Hodnett, LCSW 04/29/2014 10:09 AM   Other:  Quintella ReichertBeverly Knight, RN 04/29/2014 10:09 AM   Other: Onnie BoerJennifer Clark, UR case manager 04/29/2014 10:09 AM   Other:    Other:    Other:    Other:      Scribe for Treatment Team:   Carmina MillerHorton, Soloman Mckeithan Nicole, 04/29/2014 , 10:09 AM
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
## 2014-04-30 MED ORDER — DICLOFENAC SODIUM 1 % TD GEL
2.0000 g | Freq: Four times a day (QID) | TRANSDERMAL | Status: DC
  Administered 2014-04-30 – 2014-05-01 (×2): 2 g via TOPICAL
  Filled 2014-04-30 (×2): qty 100

## 2014-04-30 NOTE — Progress Notes (Signed)
Patient ID: Jason Patterson, male   DOB: Apr 17, 1956, 58 y.o.   MRN: [id number] [facility]  04/30/2014 10:22 AM Jason Patterson  MRN:  [id number] Subjective:  Pt seen and chart reviewed. Pt denies SI, HI, and AVH, contracts for safety. However, pt states that "every now and then certain memories will trigger these fleeting thoughts of wanting to hurt myself. I know that I would never act on these thoughts but they do keep coming and going". Pt reports that his current medication regimen is working well and that he would like to continue. Pt cites main stressor as his living situation and is adamant that he wants to move as soon as possible.   Diagnosis:    AXIS I: Bipolar, Depressed  AXIS II: Deferred  AXIS III:  Past Medical History   Diagnosis  Date   .  Psoriasis    .  Seizure disorder    .  GERD (gastroesophageal reflux disease)    .  COPD (chronic obstructive pulmonary disease)    .  Generalized anxiety disorder    .  Bipolar disorder, unspecified    .  Schizoaffective disorder, unspecified condition    .  Essential hypertension, benign    .  Seizures     AXIS IV: other psychosocial or environmental problems and problems related to social environment  AXIS V: 50   Total Time spent with patient: 25 minutes   ADL's:  Impaired  Sleep: Good  Appetite:  Fair  Suicidal Ideation:  Passive thoughts of death, dying but denies any plan or intention of hurting self Homicidal Ideation:  Denies  AEB (as evidenced by):  Psychiatric Specialty Exam: Physical Exam  Review of Systems  Constitutional: Negative for fever and chills.  Respiratory: Negative for shortness of breath.   Cardiovascular: Negative for chest pain.  Gastrointestinal: Negative for nausea and vomiting.  Genitourinary: Negative for dysuria and urgency.  Neurological: Positive for tremors.  Psychiatric/Behavioral: Positive for depression.       No current plan or intention to hurt self    Blood  pressure 124/88, pulse 63, temperature 98 F (36.7 C), temperature source Oral, resp. rate 18, height 5\' 7"  (1.702 m), weight 89.812 kg (198 lb).Body mass index is 31 kg/(m^2).  General Appearance: Fairly Groomed  Patent attorneyye Contact::  Good  Speech:  Normal Rate  Volume:  Normal  Mood:  Depressed  But improving   Affect:  Reactive   Thought Process:  Linear   Orientation:  Full  Thought Content:  denies hallucinations, no delusions  Suicidal Thoughts:  No- currently not suicidal and contracts for safety on unit  Homicidal Thoughts:  No  Memory:  NA  Judgement:  Fair  Insight:  Fair  Psychomotor Activity:  Normal  Concentration:  Good  Recall:  Good  Fund of Knowledge:Good  Language: Good  Akathisia:  No  Handed:  Right  AIMS (if indicated):     Assets:  Desire for Improvement Resilience  Sleep:  Number of Hours: 6   Musculoskeletal: Strength & Muscle Tone: within normal limits - tremors noted, mostly on right hand Gait & Station: normal Patient leans: N/A  Current Medications: Current Facility-Administered Medications  Medication Dose Route Frequency Provider Last Rate Last Dose  . acetaminophen (TYLENOL) tablet 650 mg  650 mg Oral Q6H PRN Kerry HoughSpencer E Simon, PA-C   650 mg at 04/30/14 0546  . albuterol (PROVENTIL HFA;VENTOLIN HFA) 108 (90 BASE) MCG/ACT inhaler 2 puff  2 puff Inhalation Q6H  PRN Kerry Hough, PA-C      . alum & mag hydroxide-simeth (MAALOX/MYLANTA) 200-200-20 MG/5ML suspension 30 mL  30 mL Oral Q4H PRN Kerry Hough, PA-C   30 mL at 04/29/14 1720  . [START ON 05/10/2014] ARIPiprazole SUSR 400 mg  400 mg Intramuscular Q28 days Nehemiah Massed, MD      . asenapine (SAPHRIS) sublingual tablet 10 mg  10 mg Sublingual Q12H PRN Kerry Hough, PA-C   10 mg at 04/30/14 0756  . clonazePAM (KLONOPIN) tablet 1 mg  1 mg Oral BID Kerry Hough, PA-C   1 mg at 04/30/14 0756  . cloNIDine (CATAPRES) tablet 0.1 mg  0.1 mg Oral BID Kerry Hough, PA-C   0.1 mg at 04/30/14 0756   . ergocalciferol (VITAMIN D2) capsule 50,000 Units  50,000 Units Oral Weekly Kerry Hough, PA-C   50,000 Units at 04/27/14 1610  . gabapentin (NEURONTIN) capsule 300 mg  300 mg Oral BID Kerry Hough, PA-C   300 mg at 04/30/14 0756  . lactulose (CHRONULAC) 10 GM/15ML solution 10 g  10 g Oral Daily Kerry Hough, PA-C      . lithium carbonate capsule 600 mg  600 mg Oral BID Kerry Hough, PA-C   600 mg at 04/30/14 0755  . loratadine (CLARITIN) tablet 10 mg  10 mg Oral Daily Kerry Hough, PA-C   10 mg at 04/30/14 0755  . magnesium hydroxide (MILK OF MAGNESIA) suspension 30 mL  30 mL Oral Daily PRN Kerry Hough, PA-C      . mometasone-formoterol (DULERA) 200-5 MCG/ACT inhaler 2 puff  2 puff Inhalation BID Kerry Hough, PA-C   2 puff at 04/30/14 0756  . pantoprazole (PROTONIX) EC tablet 40 mg  40 mg Oral Daily Kerry Hough, PA-C   40 mg at 04/30/14 0756  . tamsulosin (FLOMAX) capsule 0.4 mg  0.4 mg Oral q morning - 10a Kerry Hough, PA-C   0.4 mg at 04/30/14 0755  . tiotropium (SPIRIVA) inhalation capsule 18 mcg  18 mcg Inhalation Daily Kerry Hough, PA-C   18 mcg at 04/30/14 0756  . topiramate (TOPAMAX) tablet 50 mg  50 mg Oral Daily Kerry Hough, PA-C   50 mg at 04/30/14 0755  . traMADol (ULTRAM) tablet 50-100 mg  50-100 mg Oral Q6H PRN Nehemiah Massed, MD   100 mg at 04/30/14 0756  . traZODone (DESYREL) tablet 50 mg  50 mg Oral QHS PRN Kerry Hough, PA-C   50 mg at 04/29/14 2148    Lab Results:  No results found for this or any previous visit (from the past 48 hour(s)).  Physical Findings: AIMS: Facial and Oral Movements Muscles of Facial Expression: None, normal Lips and Perioral Area: None, normal Jaw: None, normal Tongue: None, normal,Extremity Movements Upper (arms, wrists, hands, fingers): None, normal Lower (legs, knees, ankles, toes): None, normal, Trunk Movements Neck, shoulders, hips: None, normal, Overall Severity Severity of abnormal movements  (highest score from questions above): None, normal Incapacitation due to abnormal movements: None, normal Patient's awareness of abnormal movements (rate only patient's report): No Awareness, Dental Status Current problems with teeth and/or dentures?: Yes (no teeth, no dentures since 2001) Does patient usually wear dentures?: No  CIWA:    COWS:    Assessment:  Improving mood and affect- less depressed, behavior in good control. Tolerating medications well. Tends to be focused on stressful housing environment but as per report referring him elsewhere  may be a challenge - see above.  Treatment Plan Summary: Daily contact with patient to assess and evaluate symptoms and progress in treatment Medication management continue current medication regimen Continue treatment.   Plan: ongoing inpatient treatment/support, milieu, group therapy.   Medical Decision Making Problem Points:  Established problem, stable/improving (1) Data Points:  Review of new medications or change in dosage (2)  I certify that inpatient services furnished can reasonably be expected to improve the patient's condition.   Beau FannyWithrow, John C, FNP-BC 04/30/2014, 10:22 AM  Patient seen, evaluated and I agree with notes by Nurse Practitioner. Thedore MinsMojeed Felix Meras, MD

## 2014-04-30 NOTE — Progress Notes (Signed)
Patient up pacing hallway. Reports poor sleep overnight. Rates foot pain 9/10.  Encouragement offered. Given Tylenol.  Q 15 safety checks continue.

## 2014-04-30 NOTE — [facility] Group Notes (Signed)
Adult Psychoeducational Group Note  Date:  04/30/2014 Time:  10:20 PM  Group Topic/Focus:  Wrap-Up Group:   The focus of this group is to help patients review their daily goal of treatment and discuss progress on daily workbooks.  Participation Level:  Active  Participation Quality:  Appropriate  Affect:  Appropriate  Cognitive:  Appropriate  Insight: Appropriate  Engagement in Group:  Engaged  Modes of Intervention:  Discussion  Additional Comments:  Roger ShelterGordon stated that his day was fine.  He expressed that he has been having some pain but he has been able to work through it with the help of his peers.  His goal was not to take as much medication.  His plan is to work on moving to another place different from where he currently stays.  Caroll RancherLindsay, Zykeem Bauserman A 04/30/2014, 10:20 PM

## 2014-04-30 NOTE — [facility] Group Notes (Signed)
[facility] Group Notes: (Clinical Social Work)   04/30/2014      Type of Therapy:  Group Therapy   Participation Level:  Did Not Attend    Jason MantleMareida Grossman-Orr, LCSW 04/30/2014, 4:23 PM

## 2014-04-30 NOTE — [facility] Group Notes (Signed)
[facility] Group Notes:  (Nursing/MHT/Case Management/Adjunct)  Date:  04/30/2014  Time:  3:14 PM  Type of Therapy:  Psychoeducational Skills  Participation Level:  Active  Participation Quality:  Appropriate  Affect:  Appropriate  Cognitive:  Appropriate  Insight:  Appropriate  Engagement in Group:  Engaged  Modes of Intervention:  Discussion  Summary of Progress/Problems: Pt did attend self inventory group, pt reported that he was negative HI, no AH/VH noted. Pt reported that he was positive SI, however able to contract for safety. Pt rated his depression as a 9, and his helplessness/hopelessness as a 8.     Pt reported concerns about having extreme pain in his arms, shoulder, and back, pt also reported that he was having a lot of foot pain due to toenails being at least a inch long on each toe. Pt advised that the doctor will be made aware.   Jacquelyne BalintForrest, Kayven Aldaco Shanta 04/30/2014, 3:14 PM

## 2014-04-30 NOTE — Progress Notes (Signed)
Patient ID: Jason Patterson, male   DOB: August 12, 1956, 58 y.o.   MRN: [id number]  D: Pt has been very flat and depressed on the unit today, pt reported that he can't help but to think about hurting himself. Pt reported that he has no support system and that he spends a lot of time seeking friendships. Pt reported that he was having some SI, however was able to contract for safety. Pt reported being negative HI, no AH/VH noted. A: 15 min checks continued for patient safety. R: Pt safety maintained.

## 2014-05-01 ENCOUNTER — Inpatient Hospital Stay (HOSPITAL_COMMUNITY): Payer: Medicaid Other

## 2014-05-01 ENCOUNTER — Observation Stay (HOSPITAL_COMMUNITY)
Admission: AD | Admit: 2014-05-01 | Discharge: 2014-05-02 | Payer: Medicaid Other | Source: Other Acute Inpatient Hospital | Attending: Internal Medicine | Admitting: Internal Medicine

## 2014-05-01 ENCOUNTER — Encounter (HOSPITAL_COMMUNITY): Payer: Self-pay | Admitting: Emergency Medicine

## 2014-05-01 DIAGNOSIS — R079 Chest pain, unspecified: Secondary | ICD-10-CM

## 2014-05-01 DIAGNOSIS — R0602 Shortness of breath: Secondary | ICD-10-CM | POA: Insufficient documentation

## 2014-05-01 DIAGNOSIS — R0789 Other chest pain: Principal | ICD-10-CM | POA: Diagnosis present

## 2014-05-01 DIAGNOSIS — R112 Nausea with vomiting, unspecified: Secondary | ICD-10-CM | POA: Insufficient documentation

## 2014-05-01 DIAGNOSIS — F319 Bipolar disorder, unspecified: Secondary | ICD-10-CM | POA: Insufficient documentation

## 2014-05-01 DIAGNOSIS — F259 Schizoaffective disorder, unspecified: Secondary | ICD-10-CM | POA: Insufficient documentation

## 2014-05-01 DIAGNOSIS — K219 Gastro-esophageal reflux disease without esophagitis: Secondary | ICD-10-CM

## 2014-05-01 DIAGNOSIS — R42 Dizziness and giddiness: Secondary | ICD-10-CM | POA: Insufficient documentation

## 2014-05-01 DIAGNOSIS — J449 Chronic obstructive pulmonary disease, unspecified: Secondary | ICD-10-CM | POA: Insufficient documentation

## 2014-05-01 DIAGNOSIS — F250 Schizoaffective disorder, bipolar type: Secondary | ICD-10-CM | POA: Diagnosis present

## 2014-05-01 DIAGNOSIS — R5383 Other fatigue: Secondary | ICD-10-CM

## 2014-05-01 DIAGNOSIS — I10 Essential (primary) hypertension: Secondary | ICD-10-CM | POA: Diagnosis present

## 2014-05-01 DIAGNOSIS — J4489 Other specified chronic obstructive pulmonary disease: Secondary | ICD-10-CM | POA: Insufficient documentation

## 2014-05-01 DIAGNOSIS — F411 Generalized anxiety disorder: Secondary | ICD-10-CM

## 2014-05-01 DIAGNOSIS — G40909 Epilepsy, unspecified, not intractable, without status epilepticus: Secondary | ICD-10-CM | POA: Insufficient documentation

## 2014-05-01 DIAGNOSIS — R5381 Other malaise: Secondary | ICD-10-CM | POA: Insufficient documentation

## 2014-05-01 DIAGNOSIS — F329 Major depressive disorder, single episode, unspecified: Secondary | ICD-10-CM

## 2014-05-01 DIAGNOSIS — I4510 Unspecified right bundle-branch block: Secondary | ICD-10-CM

## 2014-05-01 DIAGNOSIS — M25569 Pain in unspecified knee: Secondary | ICD-10-CM | POA: Insufficient documentation

## 2014-05-01 LAB — BASIC METABOLIC PANEL
BUN: 12 mg/dL (ref 6–23)
CALCIUM: 9.3 mg/dL (ref 8.4–10.5)
CO2: 23 mEq/L (ref 19–32)
CREATININE: 0.93 mg/dL (ref 0.50–1.35)
Chloride: 101 mEq/L (ref 96–112)
GFR calc Af Amer: >90 mL/min (ref >90)
Glucose, Bld: 90 mg/dL (ref 70–99)
Potassium: 4.3 mEq/L (ref 3.7–5.3)
Sodium: 135 mEq/L — ABNORMAL LOW (ref 137–147)

## 2014-05-01 LAB — CBC WITH DIFFERENTIAL/PLATELET
Basophils Absolute: 0 10*3/uL (ref 0.0–0.1)
Basophils Relative: 0 % (ref 0–1)
EOS ABS: 0.1 10*3/uL (ref 0.0–0.7)
Eosinophils Relative: 1 % (ref 0–5)
HEMATOCRIT: 42.3 % (ref 39.0–52.0)
HEMOGLOBIN: 14.2 g/dL (ref 13.0–17.0)
Lymphocytes Relative: 20 % (ref 12–46)
Lymphs Abs: 1.6 10*3/uL (ref 0.7–4.0)
MCH: 31.6 pg (ref 26.0–34.0)
MCHC: 33.6 g/dL (ref 30.0–36.0)
MCV: 94.2 fL (ref 78.0–100.0)
MONO ABS: 0.5 10*3/uL (ref 0.1–1.0)
MONOS PCT: 6 % (ref 3–12)
Neutro Abs: 5.8 10*3/uL (ref 1.7–7.7)
Neutrophils Relative %: 73 % (ref 43–77)
Platelets: 195 10*3/uL (ref 150–400)
RBC: 4.49 MIL/uL (ref 4.22–5.81)
RDW: 13.6 % (ref 11.5–15.5)
WBC: 8 10*3/uL (ref 4.0–10.5)

## 2014-05-01 LAB — I-STAT TROPONIN, ED: TROPONIN I, POC: 0 ng/mL (ref 0.00–0.08)

## 2014-05-01 LAB — TROPONIN I

## 2014-05-01 MED ORDER — TRAMADOL HCL 50 MG PO TABS
50.0000 mg | ORAL_TABLET | Freq: Four times a day (QID) | ORAL | Status: DC | PRN
  Administered 2014-05-01 – 2014-05-02 (×3): 100 mg via ORAL
  Filled 2014-05-01 (×3): qty 2

## 2014-05-01 MED ORDER — LITHIUM CARBONATE 300 MG PO CAPS
600.0000 mg | ORAL_CAPSULE | Freq: Two times a day (BID) | ORAL | Status: DC
  Administered 2014-05-01 – 2014-05-02 (×2): 600 mg via ORAL
  Filled 2014-05-01 (×3): qty 2

## 2014-05-01 MED ORDER — ERGOCALCIFEROL 1.25 MG (50000 UT) PO CAPS: 50000.0000 [IU] | ORAL_CAPSULE | ORAL | Status: DC

## 2014-05-01 MED ORDER — TOPIRAMATE 25 MG PO TABS
50.0000 mg | ORAL_TABLET | Freq: Every day | ORAL | Status: DC
  Administered 2014-05-02: 50 mg via ORAL
  Filled 2014-05-01: qty 2

## 2014-05-01 MED ORDER — ALBUTEROL SULFATE (2.5 MG/3ML) 0.083% IN NEBU: 2.5000 mg | INHALATION_SOLUTION | Freq: Four times a day (QID) | RESPIRATORY_TRACT | Status: DC | PRN

## 2014-05-01 MED ORDER — ZOLPIDEM TARTRATE 5 MG PO TABS: 5.0000 mg | ORAL_TABLET | Freq: Every evening | ORAL | Status: DC | PRN

## 2014-05-01 MED ORDER — ARIPIPRAZOLE ER 400 MG IM SUSR: 400.0000 mg | INTRAMUSCULAR | Status: DC

## 2014-05-01 MED ORDER — VITAMIN D (ERGOCALCIFEROL) 1.25 MG (50000 UNIT) PO CAPS: 50000.0000 [IU] | ORAL_CAPSULE | ORAL | Status: DC

## 2014-05-01 MED ORDER — GABAPENTIN 300 MG PO CAPS
300.0000 mg | ORAL_CAPSULE | Freq: Two times a day (BID) | ORAL | Status: DC
  Administered 2014-05-01 – 2014-05-02 (×2): 300 mg via ORAL
  Filled 2014-05-01 (×3): qty 1

## 2014-05-01 MED ORDER — ASENAPINE MALEATE 5 MG SL SUBL
10.0000 mg | SUBLINGUAL_TABLET | Freq: Two times a day (BID) | SUBLINGUAL | Status: DC | PRN
  Filled 2014-05-01: qty 2

## 2014-05-01 MED ORDER — TRAZODONE HCL 50 MG PO TABS
50.0000 mg | ORAL_TABLET | Freq: Every evening | ORAL | Status: DC | PRN
  Filled 2014-05-01: qty 1

## 2014-05-01 MED ORDER — LACTULOSE 10 GM/15ML PO SOLN
10.0000 g | Freq: Every day | ORAL | Status: DC
  Administered 2014-05-01 – 2014-05-02 (×2): 10 g via ORAL
  Filled 2014-05-01 (×2): qty 15

## 2014-05-01 MED ORDER — NITROGLYCERIN 0.4 MG SL SUBL: 0.4000 mg | SUBLINGUAL_TABLET | SUBLINGUAL | Status: DC | PRN

## 2014-05-01 MED ORDER — TIOTROPIUM BROMIDE MONOHYDRATE 18 MCG IN CAPS
18.0000 ug | ORAL_CAPSULE | Freq: Every day | RESPIRATORY_TRACT | Status: DC
  Administered 2014-05-02: 18 ug via RESPIRATORY_TRACT
  Filled 2014-05-01 (×2): qty 5

## 2014-05-01 MED ORDER — CLONIDINE HCL 0.1 MG PO TABS
0.1000 mg | ORAL_TABLET | Freq: Two times a day (BID) | ORAL | Status: DC
  Administered 2014-05-01 – 2014-05-02 (×2): 0.1 mg via ORAL
  Filled 2014-05-01 (×3): qty 1

## 2014-05-01 MED ORDER — MOMETASONE FURO-FORMOTEROL FUM 200-5 MCG/ACT IN AERO
2.0000 | INHALATION_SPRAY | Freq: Two times a day (BID) | RESPIRATORY_TRACT | Status: DC
  Administered 2014-05-02: 2 via RESPIRATORY_TRACT
  Filled 2014-05-01 (×2): qty 8.8

## 2014-05-01 MED ORDER — TAMSULOSIN HCL 0.4 MG PO CAPS
0.4000 mg | ORAL_CAPSULE | Freq: Every day | ORAL | Status: DC
  Administered 2014-05-02: 0.4 mg via ORAL
  Filled 2014-05-01: qty 1

## 2014-05-01 MED ORDER — CLONAZEPAM 1 MG PO TABS
1.0000 mg | ORAL_TABLET | Freq: Two times a day (BID) | ORAL | Status: DC
  Administered 2014-05-01 – 2014-05-02 (×2): 1 mg via ORAL
  Filled 2014-05-01 (×2): qty 1

## 2014-05-01 MED ORDER — DICLOFENAC SODIUM 1 % TD GEL
1.0000 "application " | Freq: Four times a day (QID) | TRANSDERMAL | Status: DC
  Administered 2014-05-01 – 2014-05-02 (×4): 1 via TOPICAL
  Filled 2014-05-01 (×2): qty 100

## 2014-05-01 MED ORDER — ASPIRIN EC 81 MG PO TBEC
81.0000 mg | DELAYED_RELEASE_TABLET | Freq: Every day | ORAL | Status: DC
  Administered 2014-05-02: 81 mg via ORAL
  Filled 2014-05-01: qty 1

## 2014-05-01 MED ORDER — IBUPROFEN 400 MG PO TABS: 600.0000 mg | ORAL_TABLET | Freq: Four times a day (QID) | ORAL | Status: DC | PRN

## 2014-05-01 MED ORDER — ENOXAPARIN SODIUM 40 MG/0.4ML ~~LOC~~ SOLN
40.0000 mg | SUBCUTANEOUS | Status: DC
  Administered 2014-05-02: 40 mg via SUBCUTANEOUS
  Filled 2014-05-01: qty 0.4

## 2014-05-01 MED ORDER — PANTOPRAZOLE SODIUM 40 MG PO TBEC
80.0000 mg | DELAYED_RELEASE_TABLET | Freq: Every day | ORAL | Status: DC
  Administered 2014-05-02: 80 mg via ORAL
  Filled 2014-05-01: qty 2

## 2014-05-01 MED ORDER — ASPIRIN 325 MG PO TABS
ORAL_TABLET | ORAL | Status: AC
  Administered 2014-05-01: 09:00:00
  Filled 2014-05-01: qty 1

## 2014-05-01 NOTE — ED Notes (Signed)
Sitter at bedside. Pt is requesting paper and pen.

## 2014-05-01 NOTE — ED Notes (Signed)
Patient resting with sitter at the bedside.

## 2014-05-01 NOTE — Progress Notes (Signed)
Patient ID: Jason Patterson, male   DOB: [birth year], 58 y.o.   MRN: [id number] D)  Was in bed at the beginning of the shift, but got out of bed and went to group with a little encouragement.  Has been pleasant and cooperative, interacting appropriately with staff and select peers, although rather quiet.  Has denied SOB, refused inhaler this evening but has had discomfort in neck, and later arms, and was medicated .  Talked briefly about seeing his neurologist and probability of surgery on his arms, decrease in his motor skills, depressed, unsure of outcome.   A)  Support, encouragement, will continue to monitor for safety, continue POC R)  Safety maintained.

## 2014-05-01 NOTE — ED Notes (Signed)
Meal tray ordered 

## 2014-05-01 NOTE — ED Notes (Signed)
Sitter at bedside.

## 2014-05-01 NOTE — ED Notes (Signed)
Pt given water 
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
## 2014-05-01 NOTE — Progress Notes (Signed)
This writer was in the hallway on the 500 hall talking to another patient when patient grabbed his chest and started to collapse to the floor. This Clinical research associatewriter and another nurse assisted patient to the chair. Pt continued to report a stabbing pain to his chest that was radiating to his back and shoulder. Pt also reported some SOB, a code blue was called. Pt vitals were at 0830: Resp 26, HR 91, BP 152/101, and O2 89% on RA/ 02 98% on 2L. Pt continued to report a stabbing pain to chest with radiation to arms and back. EMS was called, instructions were given to give Aspirin 325 times one now. Aspirin was given at 0840. Pt was taken to ER, for clearance.

## 2014-05-01 NOTE — ED Provider Notes (Addendum)
Medical screening examination/treatment/procedure(s) were conducted as a shared visit with non-physician practitioner(s) and myself.  I personally evaluated the patient during the encounter.   EKG Interpretation   Date/Time:  Sunday May 01 2014 09:30:38 EDT Ventricular Rate:  70 PR Interval:  202 QRS Duration: 126 QT Interval:  405 QTC Calculation: 437 R Axis:   -39 Text Interpretation:  Sinus rhythm Borderline prolonged PR interval Right  bundle branch block No significant change since last tracing Confirmed by  Khaya Theissen  MD, Arch Methot ([id number]) on 05/01/2014 11:55:25 AM      Patient here complaining of exertional chest pain that radiates to his left arm that was associated with ambulation. Patient's EKG without signs of ischemic changes. Will need to admit for cardiac workup  Toy BakerAnthony T Vonetta Foulk, MD 05/01/14 1155  Toy BakerAnthony T Jalan Fariss, MD 05/01/14 [phone]
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
## 2014-05-01 NOTE — ED Notes (Signed)
The patient complained of chest pain was at behavioral health and personnel called EMS.  The patient complained of chest pain and had nausea and vomiting.  Personnel also said he was going "down" but they sat him in a chair.  He never had LOC.  The patient is chest pain free at this time, [facility] clonidine, klonopin, aspirin and toradol.  [facility].

## 2014-05-02 ENCOUNTER — Observation Stay (HOSPITAL_COMMUNITY): Payer: Medicaid Other

## 2014-05-02 ENCOUNTER — Encounter (HOSPITAL_COMMUNITY): Payer: Self-pay | Admitting: Behavioral Health

## 2014-05-02 ENCOUNTER — Inpatient Hospital Stay (HOSPITAL_COMMUNITY)
Admission: AD | Admit: 2014-05-02 | Discharge: 2014-05-11 | DRG: 880 | Disposition: A | Discharge status: Home / Self Care | Payer: Medicaid Other | Source: Intra-hospital | Attending: Internal Medicine | Admitting: Internal Medicine

## 2014-05-02 DIAGNOSIS — F251 Schizoaffective disorder, depressive type: Secondary | ICD-10-CM

## 2014-05-02 DIAGNOSIS — J449 Chronic obstructive pulmonary disease, unspecified: Secondary | ICD-10-CM | POA: Diagnosis present

## 2014-05-02 DIAGNOSIS — E559 Vitamin D deficiency, unspecified: Secondary | ICD-10-CM | POA: Diagnosis present

## 2014-05-02 DIAGNOSIS — F319 Bipolar disorder, unspecified: Secondary | ICD-10-CM | POA: Diagnosis present

## 2014-05-02 DIAGNOSIS — F323 Major depressive disorder, single episode, severe with psychotic features: Secondary | ICD-10-CM | POA: Diagnosis present

## 2014-05-02 DIAGNOSIS — R079 Chest pain, unspecified: Secondary | ICD-10-CM

## 2014-05-02 DIAGNOSIS — F259 Schizoaffective disorder, unspecified: Secondary | ICD-10-CM | POA: Diagnosis present

## 2014-05-02 DIAGNOSIS — F411 Generalized anxiety disorder: Secondary | ICD-10-CM | POA: Diagnosis present

## 2014-05-02 DIAGNOSIS — G4700 Insomnia, unspecified: Secondary | ICD-10-CM | POA: Diagnosis present

## 2014-05-02 DIAGNOSIS — I10 Essential (primary) hypertension: Secondary | ICD-10-CM | POA: Diagnosis present

## 2014-05-02 DIAGNOSIS — G40909 Epilepsy, unspecified, not intractable, without status epilepticus: Secondary | ICD-10-CM | POA: Diagnosis present

## 2014-05-02 DIAGNOSIS — K219 Gastro-esophageal reflux disease without esophagitis: Secondary | ICD-10-CM

## 2014-05-02 DIAGNOSIS — R45851 Suicidal ideations: Principal | ICD-10-CM

## 2014-05-02 DIAGNOSIS — F17200 Nicotine dependence, unspecified, uncomplicated: Secondary | ICD-10-CM

## 2014-05-02 DIAGNOSIS — J4489 Other specified chronic obstructive pulmonary disease: Secondary | ICD-10-CM | POA: Diagnosis present

## 2014-05-02 LAB — TROPONIN I

## 2014-05-02 MED ORDER — TIOTROPIUM BROMIDE MONOHYDRATE 18 MCG IN CAPS
18.0000 ug | ORAL_CAPSULE | Freq: Every day | RESPIRATORY_TRACT | Status: DC
  Administered 2014-05-03 – 2014-05-11 (×9): 18 ug via RESPIRATORY_TRACT
  Filled 2014-05-02: qty 5

## 2014-05-02 MED ORDER — TRAMADOL HCL 50 MG PO TABS
50.0000 mg | ORAL_TABLET | Freq: Three times a day (TID) | ORAL | Status: DC | PRN
  Administered 2014-05-02 – 2014-05-06 (×11): 50 mg via ORAL
  Filled 2014-05-02 (×11): qty 1

## 2014-05-02 MED ORDER — TOPIRAMATE 25 MG PO TABS
50.0000 mg | ORAL_TABLET | Freq: Every day | ORAL | Status: DC
  Administered 2014-05-03 – 2014-05-11 (×9): 50 mg via ORAL
  Filled 2014-05-02 (×11): qty 2

## 2014-05-02 MED ORDER — ALUM & MAG HYDROXIDE-SIMETH 200-200-20 MG/5ML PO SUSP
30.0000 mL | ORAL | Status: DC | PRN
  Administered 2014-05-07 – 2014-05-09 (×2): 30 mL via ORAL

## 2014-05-02 MED ORDER — LITHIUM CARBONATE 300 MG PO CAPS
600.0000 mg | ORAL_CAPSULE | Freq: Two times a day (BID) | ORAL | Status: DC
  Administered 2014-05-02 – 2014-05-11 (×18): 600 mg via ORAL
  Filled 2014-05-02 (×23): qty 2

## 2014-05-02 MED ORDER — ASENAPINE MALEATE 5 MG SL SUBL: 10.0000 mg | SUBLINGUAL_TABLET | Freq: Two times a day (BID) | SUBLINGUAL | Status: DC | PRN

## 2014-05-02 MED ORDER — PANTOPRAZOLE SODIUM 40 MG PO TBEC
40.0000 mg | DELAYED_RELEASE_TABLET | Freq: Every day | ORAL | Status: DC
  Administered 2014-05-03 – 2014-05-11 (×9): 40 mg via ORAL
  Filled 2014-05-02 (×11): qty 1

## 2014-05-02 MED ORDER — ACETAMINOPHEN 325 MG PO TABS
650.0000 mg | ORAL_TABLET | Freq: Four times a day (QID) | ORAL | Status: DC | PRN
  Administered 2014-05-03 – 2014-05-08 (×9): 650 mg via ORAL
  Filled 2014-05-02 (×8): qty 2

## 2014-05-02 MED ORDER — MAGNESIUM HYDROXIDE 400 MG/5ML PO SUSP: 30.0000 mL | Freq: Every day | ORAL | Status: DC | PRN

## 2014-05-02 MED ORDER — MOMETASONE FURO-FORMOTEROL FUM 200-5 MCG/ACT IN AERO
2.0000 | INHALATION_SPRAY | Freq: Two times a day (BID) | RESPIRATORY_TRACT | Status: DC
  Administered 2014-05-02 – 2014-05-11 (×16): 2 via RESPIRATORY_TRACT
  Filled 2014-05-02 (×2): qty 8.8

## 2014-05-02 MED ORDER — VITAMIN D (ERGOCALCIFEROL) 1.25 MG (50000 UNIT) PO CAPS
50000.0000 [IU] | ORAL_CAPSULE | ORAL | Status: DC
  Administered 2014-05-04 – 2014-05-11 (×2): 50000 [IU] via ORAL
  Filled 2014-05-02 (×2): qty 1

## 2014-05-02 MED ORDER — GABAPENTIN 300 MG PO CAPS
300.0000 mg | ORAL_CAPSULE | Freq: Two times a day (BID) | ORAL | Status: DC
  Administered 2014-05-02 – 2014-05-11 (×18): 300 mg via ORAL
  Filled 2014-05-02 (×24): qty 1

## 2014-05-02 MED ORDER — ALBUTEROL SULFATE HFA 108 (90 BASE) MCG/ACT IN AERS
2.0000 | INHALATION_SPRAY | Freq: Four times a day (QID) | RESPIRATORY_TRACT | Status: DC | PRN
  Filled 2014-05-02: qty 6.7

## 2014-05-02 MED ORDER — CLONAZEPAM 1 MG PO TABS
1.0000 mg | ORAL_TABLET | Freq: Two times a day (BID) | ORAL | Status: DC | PRN
  Administered 2014-05-02 – 2014-05-10 (×15): 1 mg via ORAL
  Filled 2014-05-02 (×16): qty 1

## 2014-05-02 MED ORDER — ASPIRIN 81 MG PO TBEC: 81.0000 mg | DELAYED_RELEASE_TABLET | Freq: Every day | ORAL | Status: DC

## 2014-05-02 MED ORDER — TECHNETIUM TC 99M SESTAMIBI GENERIC - CARDIOLITE
30.0000 | Freq: Once | INTRAVENOUS | Status: AC | PRN
  Administered 2014-05-02: 30 via INTRAVENOUS

## 2014-05-02 MED ORDER — CLONIDINE HCL 0.1 MG PO TABS
0.1000 mg | ORAL_TABLET | Freq: Two times a day (BID) | ORAL | Status: DC
  Administered 2014-05-02 – 2014-05-11 (×18): 0.1 mg via ORAL
  Filled 2014-05-02 (×24): qty 1

## 2014-05-02 MED ORDER — TRAZODONE HCL 50 MG PO TABS
50.0000 mg | ORAL_TABLET | Freq: Every day | ORAL | Status: DC
  Administered 2014-05-02 – 2014-05-10 (×8): 50 mg via ORAL
  Filled 2014-05-02 (×12): qty 1

## 2014-05-02 MED ORDER — LORATADINE 10 MG PO TABS
10.0000 mg | ORAL_TABLET | Freq: Every day | ORAL | Status: DC
Start: 2014-05-03 — End: 2014-05-11
  Administered 2014-05-03 – 2014-05-11 (×9): 10 mg via ORAL
  Filled 2014-05-02 (×11): qty 1

## 2014-05-02 MED ORDER — TECHNETIUM TC 99M SESTAMIBI GENERIC - CARDIOLITE
10.0000 | Freq: Once | INTRAVENOUS | Status: AC | PRN
  Administered 2014-05-02: 10 via INTRAVENOUS

## 2014-05-02 MED ORDER — ARIPIPRAZOLE ER 400 MG IM SUSR: 400.0000 mg | INTRAMUSCULAR | Status: DC

## 2014-05-02 MED ORDER — ZOLPIDEM TARTRATE 10 MG PO TABS
10.0000 mg | ORAL_TABLET | Freq: Every evening | ORAL | Status: DC | PRN
  Filled 2014-05-02: qty 1

## 2014-05-02 MED ORDER — REGADENOSON 0.4 MG/5ML IV SOLN
INTRAVENOUS | Status: AC
  Administered 2014-05-02: 0.4 mg via INTRAVENOUS
  Filled 2014-05-02: qty 5

## 2014-05-02 MED ORDER — LACTULOSE 10 GM/15ML PO SOLN
10.0000 g | Freq: Every day | ORAL | Status: DC
  Administered 2014-05-03 – 2014-05-11 (×9): 10 g via ORAL
  Filled 2014-05-02 (×11): qty 15

## 2014-05-02 MED ORDER — TAMSULOSIN HCL 0.4 MG PO CAPS
0.4000 mg | ORAL_CAPSULE | Freq: Every morning | ORAL | Status: DC
  Administered 2014-05-03 – 2014-05-11 (×9): 0.4 mg via ORAL
  Filled 2014-05-02 (×11): qty 1

## 2014-05-02 MED ORDER — ASPIRIN EC 81 MG PO TBEC
81.0000 mg | DELAYED_RELEASE_TABLET | Freq: Every day | ORAL | Status: DC
  Administered 2014-05-03 – 2014-05-11 (×9): 81 mg via ORAL
  Filled 2014-05-02 (×11): qty 1

## 2014-05-02 MED ORDER — REGADENOSON 0.4 MG/5ML IV SOLN
0.4000 mg | Freq: Once | INTRAVENOUS | Status: AC
  Administered 2014-05-02: 0.4 mg via INTRAVENOUS

## 2014-05-02 NOTE — H&P (Signed)
Pt seen and examined. I agree with findings and plan as documented in her note. Please refer to my note for further details

## 2014-05-02 NOTE — Discharge Instructions (Signed)
Aspirin and Your Heart  Aspirin affects the way your blood clots and helps "thin" the blood. Aspirin has many uses in heart disease. It may be used as a primary prevention to help reduce the risk of heart related events. It also can be used as a secondary measure to prevent more heart attacks or to prevent additional damage from blood clots.   ASPIRIN MAY HELP IF YOU:   Have had a heart attack or chest pain.   Have undergone open heart surgery such as CABG (Coronary Artery Bypass Surgery).   Have had coronary angioplasty with or without stents.   Have experienced a stroke or TIA (transient ischemic attack).   Have peripheral vascular disease (PAD).   Have chronic heart rhythm problems such as atrial fibrillation.   Are at risk for heart disease.  BEFORE STARTING ASPIRIN  Before you start taking aspirin, your caregiver will need to review your medical history. Many things will need to be taken into consideration, such as:   Smoking status.   Blood pressure.   Diabetes.   Gender.   Weight.   Cholesterol level.  ASPIRIN DOSES   Aspirin should only be taken on the advice of your caregiver. Talk to your caregiver about how much aspirin you should take. Aspirin comes in different doses such as:   81 mg.   162 mg.   325 mg.   The aspirin dose you take may be affected by many factors, some of which include:   Your current medications, especially if your are taking blood-thinners or anti-platelet medicine.   Liver function.   Heart disease risk.   Age.   Aspirin comes in two forms:   Non-enteric-coated. This type of aspirin does not have a coating and is absorbed faster. Non-enteric coated aspirin is recommended for patients experiencing chest pain symptoms. This type of aspirin also comes in a chewable form.    Enteric-coated. This means the aspirin has a special coating that releases the medicine very slowly. Enteric-coated aspirin causes less stomach upset. This type of aspirin should not be chewed or crushed.  ASPIRIN SIDE EFFECTS  Daily use of aspirin can increase your risk of serious side effects. Some of these include:   Increased bleeding. This can range from a cut that does not stop bleeding to more serious problems such as stomach bleeding or bleeding into the brain (Intracerebral bleeding).   Increased bruising.   Stomach upset.   An allergic reaction such as red, itchy skin.   Increased risk of bleeding when combined with non-steroidal anti-inflammatory medicine (NSAIDS).   Alcohol should be drank in moderation when taking aspirin. Alcohol can increase the risk of stomach bleeding when taken with aspirin.   Aspirin should not be given to children less than 18 years of age due to the association of Reye syndrome. Reye syndrome is a serious illness that can affect the brain and liver. Studies have linked Reye syndrome with aspirin use in children.   People that have nasal polyps have an increased risk of developing an aspirin allergy.  SEEK MEDICAL CARE IF:    You develop an allergic reaction such as:   Hives.   Itchy skin.   Swelling of the lips, tongue or face.   You develop stomach pain.   You have unusual bleeding or bruising.   You have ringing in your ears.  SEEK IMMEDIATE MEDICAL CARE IF:    You have severe chest pain, especially if the pain is crushing or pressure-like   and spreads to the arms, back, neck, or jaw. THIS IS AN EMERGENCY. Do not wait to see if the pain will go away. Get medical help at once. Call your local emergency services (911 in the [place] DO NOT drive yourself to the hospital.   You have stroke-like symptoms such as:   Loss of vision.   Difficulty talking.   Numbness or weakness on one side of your body.   Numbness or weakness in your arm or leg.    Not thinking clearly or feeling confused.   Your bowel movements are bloody, dark red or black in color.   You vomit or cough up blood.   You have blood in your urine.   You have shortness of breath, coughing or wheezing.  MAKE SURE YOU:    Understand these instructions.   Will monitor your condition.   Seek immediate medical care if necessary.  Document Released: 10/17/2008 Document Revised: 03/01/2013 Document Reviewed: 10/17/2008  ExitCare Patient Information 2014 [employer].

## 2014-05-02 NOTE — Progress Notes (Signed)
Patient Discharge Instructions:  Next Level Care Provider Has Access to the EMR, 05/02/14 [facility]  The entire medical records is made available via [facility]/Epic access.  Jerelene ReddenSheena E [place], 05/02/2014, 11:38 AM

## 2014-05-02 NOTE — Progress Notes (Signed)
Subjective: [name] is on his way for stress test this morning.  Still chest pain free.   Objective: Vital signs in last 24 hours: Filed Vitals:   05/01/14 1641 05/01/14 1654 05/01/14 2040  BP:  121/79 142/89  Pulse:  71 63  Temp:  98.6 F (37 C) 98.2 F (36.8 C)  TempSrc:  Oral Oral  Resp:  20 20  Height: 5\' 6"  (1.676 m)    Weight: 199 lb 15.3 oz (90.7 kg)    SpO2:  100% 100%   PEX General: alert, cooperative, and in no apparent distress HEENT: NCAT, vision grossly intact, oropharynx clear and non-erythematous  Neck: supple, no lymphadenopathy Lungs: clear to ascultation bilaterally, normal work of respiration, no wheezes, rales, ronchi Heart: regular rate and rhythm, no murmurs, gallops, or rubs Abdomen: soft, non-tender, non-distended, normal bowel sounds Extremities: 2+ DP/PT pulses bilaterally, no cyanosis, clubbing, or edema Neurologic: alert & oriented X3, cranial nerves II-XII intact, strength grossly intact, sensation intact to light touch  Lab Results: Basic Metabolic Panel:  Recent Labs Lab 04/26/14 0620 05/01/14 1047  NA 137 135*  K 4.3 4.3  CL 102 101  CO2 24 23  GLUCOSE 109* 90  BUN 12 12  CREATININE 1.10 0.93  CALCIUM 10.1 9.3   Liver Function Tests:  Recent Labs Lab 04/25/14 1732  AST 15  ALT 15  ALKPHOS 94  BILITOT 0.6  PROT 6.9  ALBUMIN 4.5   CBC:  Recent Labs Lab 04/26/14 0620 05/01/14 1047  WBC 10.1 8.0  NEUTROABS 7.2 5.8  HGB 16.1 14.2  HCT 47.8 42.3  MCV 93.0 94.2  PLT 245 195   Cardiac Enzymes:  Recent Labs Lab 05/01/14 1820 05/02/14 0003 05/02/14 0425  TROPONINI <0.30 <0.30 <0.30   Hemoglobin A1C:  Recent Labs Lab 04/27/14 0625  HGBA1C 5.5   Fasting Lipid Panel:  Recent Labs Lab 04/27/14 0625  CHOL 107  HDL 33*  LDLCALC 53  TRIG 409107  CHOLHDL 3.2   Thyroid Function Tests:  Recent Labs Lab 04/27/14 0625  TSH 0.998   Urine Drug Screen: Drugs of Abuse     Component Value Date/Time     LABOPIA NONE DETECTED 04/25/2014 1932   COCAINSCRNUR NONE DETECTED 04/25/2014 1932   LABBENZ NONE DETECTED 04/25/2014 1932   AMPHETMU NONE DETECTED 04/25/2014 1932   THCU NONE DETECTED 04/25/2014 1932   LABBARB NONE DETECTED 04/25/2014 1932    Alcohol Level:  Recent Labs Lab 04/25/14 1732  ETH <11   Studies/Results: Dg Chest 2 View  05/01/2014   CLINICAL DATA:  Left side chest pain  EXAM: CHEST  2 VIEW  COMPARISON:  02/15/2011  FINDINGS: Cardiomediastinal silhouette is stable. No acute infiltrate or pulmonary edema. Stable bilateral small granulomas. Mild degenerative changes thoracic spine.  IMPRESSION: No active cardiopulmonary disease.  No significant change.   Electronically Signed   By: Natasha MeadLiviu  Pop M.D.   On: 05/01/2014 11:54   Medications: I have reviewed the patient's current medications. Scheduled Meds: . [START ON 05/11/2014] ARIPiprazole  400 mg Intramuscular Q30 days  . aspirin EC  81 mg Oral Daily  . clonazePAM  1 mg Oral BID  . cloNIDine  0.1 mg Oral BID  . diclofenac sodium  1 application Topical QID  . enoxaparin (LOVENOX) injection  40 mg Subcutaneous Q24H  . gabapentin  300 mg Oral BID  . lactulose  10 g Oral Daily  . lithium carbonate  600 mg Oral BID  . mometasone-formoterol  2  puff Inhalation BID  . pantoprazole  80 mg Oral Daily  . tamsulosin  0.4 mg Oral Daily  . tiotropium  18 mcg Inhalation Daily  . topiramate  50 mg Oral Daily  . [START ON 05/04/2014] Vitamin D (Ergocalciferol)  50,000 Units Oral Q7 days   PRN Meds:.albuterol, asenapine, nitroGLYCERIN, traMADol, traZODone, zolpidem Assessment/Plan: #Atypical chest pain- Patient presented after an episode of left sided chest pain while walking, currently chest pain-free; pain ?reproducible, TTP over area that was painful earlier. No EKG changes, troponin x 3 negative. TIMI score of 1 (5% risk at 14 days of all-cause mortality, new or recurrent MI, or severe recurrent ischemia requiring urgent revascularization).  Patient does give a story concerning for stable angina with recurrent chest pain with exertion, relieved with rest thus will pursue inpatient stress testing per cardiology. Echo in 2011 showed EF 55-60% with normal systolic function, borderline LVH, normal wall motion. CXR negative. Well's score of 0 thus low suspicion for PE.  Caution with BB given borderline bradycardia, history of COPD  -cardiology consult, appreciate recs   -continue ASA 81 mg daily  -discharge pending Lexiscan read  #HTN- BP at goal for age. On clonidine 0.1 mg BID, continue while inpatient.   #Schizoaffective disorder- [facility]; on Lithium, Abilify, Saphris, Klonopin, trazodone, Ambien. Continue current medications, [facility] this admission. Sitter at bedside.   #History of seizures- On Topamax at home, continue while inpatient.    Dispo:  [facility]   The patient does have a current PCP [facility](Ayyaz Qureshi, MD) and [facility].   .Services Needed at time of discharge: Y = Yes, Blank = No PT:   OT:   RN:   Equipment:   Other:     LOS: 1 day   Rocco SereneMorgan Ski Polich, MD 05/02/2014, 8:32 AM

## 2014-05-02 NOTE — Progress Notes (Signed)
Spoke with patients Legal Guardian of [employer] named Jason Patterson.  Obtained telephone consent, verified with second nurse, for patient to be transferred to behavioral health today.

## 2014-05-02 NOTE — Progress Notes (Signed)
Patient returned back to room from stress test.  1:1 Sitter remains at patients bedside.  Will continue to monitor.  [name],Edman Lipsey 05/02/2014 1:59 PM

## 2014-05-02 NOTE — ED Provider Notes (Signed)
Medical screening examination/treatment/procedure(s) were conducted as a shared visit with non-physician practitioner(s) and myself.  I personally evaluated the patient during the encounter.   EKG Interpretation   Date/Time:  Sunday May 01 2014 09:30:38 EDT Ventricular Rate:  70 PR Interval:  202 QRS Duration: 126 QT Interval:  405 QTC Calculation: 437 R Axis:   -39 Text Interpretation:  Sinus rhythm Borderline prolonged PR interval Right  bundle branch block No significant change since last tracing Confirmed by  Freida BusmanALLEN  MD, Zeola Brys ([id number]) on 05/01/2014 11:55:25 AM       Toy BakerAnthony T Mercer Stallworth, MD 05/02/14 [phone]

## 2014-05-02 NOTE — Progress Notes (Signed)
UR Completed.  Jason Patterson Jane [phone] 05/02/2014  
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
## 2014-05-02 NOTE — Discharge Summary (Signed)
Name: Jason Patterson MRN: [id number] DOB: [birth year] 58 y.o. PCP: Jason BranchAyyaz Qureshi, MD  Date of Admission: 05/01/2014  4:07 PM Date of Discharge: 05/02/2014 Attending Physician: Jason LagosNischal Narendra, MD  Discharge Diagnosis: Principal Problem:   Chest pain Active Problems:   Essential hypertension, benign   Schizoaffective disorder, bipolar type  Discharge Medications:   Medication List         ABILIFY MAINTENA 400 MG Susr  Generic drug:  ARIPiprazole  Inject 400 mg into the muscle every 30 (thirty) days.     asenapine 5 MG Subl 24 hr tablet  Commonly known as:  SAPHRIS  Place 10 mg under the tongue every 12 (twelve) hours as needed (aggitation).     aspirin 81 MG EC tablet  Take 1 tablet (81 mg total) by mouth daily.     cetirizine 10 MG tablet  Commonly known as:  ZYRTEC  Take 10 mg by mouth every morning.     clonazePAM 2 MG tablet  Commonly known as:  KLONOPIN  Take 1 mg by mouth every 12 (twelve) hours as needed for anxiety.     cloNIDine 0.1 MG tablet  Commonly known as:  CATAPRES  Take 0.1 mg by mouth 2 (two) times daily.     diclofenac sodium 1 % Gel  Commonly known as:  VOLTAREN  Apply 1 application topically 4 (four) times daily.     ergocalciferol 50000 UNITS capsule  Commonly known as:  VITAMIN D2  Take 50,000 Units by mouth every Wednesday.     Fluticasone-Salmeterol 500-50 MCG/DOSE Aepb  Commonly known as:  ADVAIR  Inhale 1 puff into the lungs 2 (two) times daily.     gabapentin 300 MG capsule  Commonly known as:  NEURONTIN  Take 300 mg by mouth 2 (two) times daily.     lactulose 10 GM/15ML solution  Commonly known as:  CHRONULAC  Take 10 g by mouth daily.     lithium carbonate 300 MG capsule  Take 600 mg by mouth 2 (two) times daily.     omeprazole 40 MG capsule  Commonly known as:  PRILOSEC  Take 40 mg by mouth daily.     PROAIR HFA 108 (90 BASE) MCG/ACT inhaler  Generic drug:  albuterol  Inhale 2 puffs into the lungs every 6 (six)  hours as needed for wheezing or shortness of breath.     tamsulosin 0.4 MG Caps capsule  Commonly known as:  FLOMAX  Take 0.4 mg by mouth every morning.     tiotropium 18 MCG inhalation capsule  Commonly known as:  SPIRIVA  Place 18 mcg into inhaler and inhale daily.     topiramate 50 MG tablet  Commonly known as:  TOPAMAX  Take 50 mg by mouth daily.     traMADol 50 MG tablet  Commonly known as:  ULTRAM  Take 50 mg by mouth 3 (three) times daily as needed for moderate pain.     traZODone 50 MG tablet  Commonly known as:  DESYREL  Take 50 mg by mouth at bedtime.     zolpidem 10 MG tablet  Commonly known as:  AMBIEN  Take 20 mg by mouth at bedtime as needed for sleep.        Disposition and follow-up:   [facility].Jason Patterson [facility].  At the hospital follow up visit please address:  1.  Any recurrent chest pain?  Is patient taking daily ASA?  2.  Labs / imaging needed at time of follow-up: none  3.  Pending labs/ test needing follow-up: none  Follow-up Appointments:   Discharge Instructions: Discharge Instructions   Diet - low sodium heart healthy    Complete by:  As directed      Increase activity slowly    Complete by:  As directed            Consultations:  cardiology   Procedures Performed:  Dg Chest 2 View  05/01/2014   CLINICAL DATA:  Left side chest pain  EXAM: CHEST  2 VIEW  COMPARISON:  02/15/2011  FINDINGS: Cardiomediastinal silhouette is stable. No acute infiltrate or pulmonary edema. Stable bilateral small granulomas. Mild degenerative changes thoracic spine.  IMPRESSION: No active cardiopulmonary disease.  No significant change.   Electronically Signed   By: Jason Patterson M.D.   On: 05/01/2014 11:54    Admission HPI:  [name] is a 58 year old man with history of HTN, seizures, schizoaffective disorder (bipolar type) currently admitted at [facility] for SI, who presents with chest pain.    Patient states he was walking in hallway at [facility] this morning when he developed sudden onset left sided sharp chest pain with radiation down his left arm with associated shortness of breath, nausea/vomiting, lightheadedness. Pain lasted approximately 15 minutes, resolved with ASA and rest, no chest pain since. He does have tenderness to touch in area that was painful this morning. No personal or known family history of CAD. Patient states that he does get chest pain when hiking in woods behind his residence, resolves with rest. Reports that he was most recently admitted for chest pain at [facility] in 07/2013; also reports that he had a stress test at [facility] in 2012. Denies leg swelling, PND, orthopnea.  Patient states that he does not have a cardiologist, and no longer follows with PCP Jason Patterson; now sees NP Jason Patterson of [facility] who comes to his residence.  In ED, EKG with no changes, troponin x 1 negative. IMTS subsequently called for admission for chest pain rule-out.    Hospital Course by problem list: 1. Atypical chest pain- Patient presented after an episode of left sided chest pain while walking, no chest pain since; pain ?reproducible, TTP over area that was painful. No EKG changes, troponin x 3 negative. TIMI score of 1 (5% risk at 14 days of all-cause mortality, new or recurrent MI, or severe recurrent ischemia requiring urgent revascularization). However, patient does give a story concerning for stable angina with recurrent chest pain with exertion, relieved with rest thus pursued inpatient stress testing per cardiology.  Lexiscan was normal (no chest pain, no ST changed, no evidence of ischemic or infarction; EF 70% with normal wall motion).  Started ASA 81 mg daily while inpatient, continued at discharge.    2. HTN- BP at goal for age. On clonidine 0.1 mg BID, continue while inpatient and at discharge.   3. Schizoaffective disorder- Currently admitted at  [facility]; on Lithium, Abilify, Saphris, Klonopin, trazodone, Ambien. Continued current medications, discharged back to [facility] for continued therapy. Sitter at bedside throughout admission.   4. History of seizures- On Topamax at home, continued while inpatient and at discharge.   Discharge Vitals:   BP 114/75  Pulse 72  Temp(Src) 97.5 F (36.4 C) (Oral)  Resp 18  Ht 5\' 6"  (1.676 m)  Wt 199 lb 15.3 oz (90.7 kg)  BMI 32.29 kg/m2  SpO2 99%  Discharge Labs:  Results for orders placed during the hospital encounter of 05/01/14 (from the past 24 hour(s))  TROPONIN I     Status: None   Collection Time    05/01/14  6:20 PM      Result Value Ref Range   Troponin I <0.30  <0.30 ng/mL  TROPONIN I     Status: None   Collection Time    05/02/14 12:03 AM      Result Value Ref Range   Troponin I <0.30  <0.30 ng/mL  TROPONIN I     Status: None   Collection Time    05/02/14  4:25 AM      Result Value Ref Range   Troponin I <0.30  <0.30 ng/mL    Signed: Rocco SereneMorgan Kailani Brass, MD 05/02/2014, 3:39 PM   Time Spent on Discharge: 45 minutes Services Ordered on Discharge: none Equipment Ordered on Discharge: none

## 2014-05-02 NOTE — Progress Notes (Signed)
Pt sent for GXT. He was unable to walk more than 3:47 of Bruce, test stopped secondary to fatigue and knee pain. Max HR 103- taget 136. [facility].  Corine ShelterLUKE Cayce Paschal PA-C 05/02/2014 10:02 AM

## 2014-05-02 NOTE — H&P (Signed)
Internal Medicine Attending Admission Note Date: 05/02/2014  Patient name: Jason Patterson [place] Medical record number: [id number] Date of birth: December 16, 1955 Age: 58 y.o. Gender: male  I saw and evaluated the patient. I reviewed the resident's note and I agree with the resident's findings and plan as documented in the resident's note.  Chief Complaint(s): chest pain  History - key components related to admission: Patient is a 58 year old male with PMH of HTN, schizoaffective disorder, Seizure d/o, COPD who presents with CP * 1 episode. Pt states he was walking in the halls yesterday and developed sudden onset sharp chest pain which radiated to his left arm. It was assoc with n/v *2 episodes. No diaphoresis, no sob, no fevers. Pt complains of occasional palpiations but nothing at current time. Patient states that he used to get chest pain with hiking which resolved with rest. Remaining ROS negative  Physical Exam - key components related to admission:  Filed Vitals:   05/02/14 1210 05/02/14 1212 05/02/14 1213 05/02/14 1215  BP: 113/65  135/60 128/62  Pulse: 67 76 74 66  Temp:      TempSrc:      Resp:      Height:      Weight:      SpO2:      Cardio- RRR, normal heart sounds Lungs- CTA b/l Abd- soft, non tender, non distended, BS+ Ext- no pedal edema Gen- AAO*3, NAD   Lab results:   Basic Metabolic Panel:  Recent Labs  40/98/1106/14/15 1047  NA 135*  K 4.3  CL 101  CO2 23  GLUCOSE 90  BUN 12  CREATININE 0.93  CALCIUM 9.3   Liver Function Tests: No results found for this basename: AST, ALT, ALKPHOS, BILITOT, PROT, ALBUMIN,  in the last 72 hours No results found for this basename: LIPASE, AMYLASE,  in the last 72 hours No results found for this basename: AMMONIA,  in the last 72 hours CBC:  Recent Labs  05/01/14 1047  WBC 8.0  NEUTROABS 5.8  HGB 14.2  HCT 42.3  MCV 94.2  PLT 195   Cardiac Enzymes:  Recent Labs  05/01/14 1820 05/02/14 0003 05/02/14 0425  TROPONINI  <0.30 <0.30 <0.30   BNP: No components found with this basename: POCBNP,  D-Dimer: No results found for this basename: DDIMER,  in the last 72 hours CBG: No results found for this basename: GLUCAP,  in the last 72 hours Hemoglobin A1C: No results found for this basename: HGBA1C,  in the last 72 hours Fasting Lipid Panel: No results found for this basename: CHOL, HDL, LDLCALC, TRIG, CHOLHDL, LDLDIRECT,  in the last 72 hours Thyroid Function Tests: No results found for this basename: TSH, T4TOTAL, FREET4, T3FREE, THYROIDAB,  in the last 72 hours Anemia Panel: No results found for this basename: VITAMINB12, FOLATE, FERRITIN, TIBC, IRON, RETICCTPCT,  in the last 72 hours Coagulation: No results found for this basename: INR,  in the last 72 hours  Alcohol Level: No results found for this basename: ETH,  in the last 72 hours   Imaging results:  Dg Chest 2 View  05/01/2014   CLINICAL DATA:  Left side chest pain  EXAM: CHEST  2 VIEW  COMPARISON:  02/15/2011  FINDINGS: Cardiomediastinal silhouette is stable. No acute infiltrate or pulmonary edema. Stable bilateral small granulomas. Mild degenerative changes thoracic spine.  IMPRESSION: No active cardiopulmonary disease.  No significant change.   Electronically Signed   By: Natasha MeadLiviu  Pop M.D.   On: 05/01/2014 11:54  Other results: EKG: NSR, RBBB  Assessment & Plan by Problem:  Active Problems:   Chest pain  Chest pain: - Unlikely cardiac in nature - will follow up nuclear stress test results. Unable to complete exercise test secondary to knee pain and fatigue - troponins negative * 3 sets - No further work up at this time - c/w asa for now  HTN - BP stable. C/w clonidine and monitor  COPD - Stable. C/w current meds  Schizoaffective disorder with SI; - will transfer back to behavioral health if cardiac work up negative - c/w lithium, klonopin, abilify, trazadone, saphiris per psych  Pt stable for d/c to behavioral health if  stress test wnl

## 2014-05-02 NOTE — Progress Notes (Signed)
Patient stable for discharge back to behavioral health.  Chest pain resolved, Lexiscan negative.  Jason Patterson. Erandy Mceachern, M.D.

## 2014-05-02 NOTE — Progress Notes (Signed)
Report given to [name] RN at behavioral health.    [name],Jason Patterson 05/02/2014 8:27 PM

## 2014-05-02 NOTE — Progress Notes (Addendum)
[facility]  Patient alert, oriented, verbally responsive, breathing regular and non-labored throughout.  No s/s of distress noted throughout, no c/o pain throughout.  Patient left unit per stretcher accompanied by medic/ambulance.  VS WNL.  Jason Patterson,Jason Patterson 05/02/2014.

## 2014-05-02 NOTE — Tx Team (Signed)
Initial Interdisciplinary Treatment Plan  PATIENT STRENGTHS: (choose at least two) Active sense of humor General fund of knowledge  PATIENT STRESSORS: Health problems Occupational concerns   PROBLEM LIST: Problem List/Patient Goals Date to be addressed Date deferred Reason deferred Estimated date of resolution  Alternate housing 05/02/2014     Suicidal ideations 05/02/2014     depression 05/02/2014     anxiety 05/02/2014                                    DISCHARGE CRITERIA:  Ability to meet basic life and health needs Adequate post-discharge living arrangements Improved stabilization in mood, thinking, and/or behavior Motivation to continue treatment in a less acute level of care Safe-care adequate arrangements made  PRELIMINARY DISCHARGE PLAN: Attend aftercare/continuing care group Placement in alternative living arrangements Return to previous living arrangement  PATIENT/FAMIILY INVOLVEMENT: This treatment plan has been presented to and reviewed with the patient, Jason Patterson.  The patient and family have been given the opportunity to ask questions and make suggestions.  Angeline SlimHill, Ashley M 05/02/2014, 9:01 PM

## 2014-05-03 DIAGNOSIS — F3130 Bipolar disorder, current episode depressed, mild or moderate severity, unspecified: Secondary | ICD-10-CM

## 2014-05-03 DIAGNOSIS — R45851 Suicidal ideations: Principal | ICD-10-CM

## 2014-05-03 NOTE — [facility] Group Notes (Signed)
[facility] LCSW Group Therapy      Feelings About Diagnosis 1:15 - 2:30 PM         05/03/2014 2:33 PM    Type of Therapy:  Group Therapy  Participation Level:  Active  Participation Quality:  Appropriate  Affect:  Appropriate  Cognitive:  Alert and Appropriate  Insight:  Developing/Improving and Engaged  Engagement in Therapy:  Developing/Improving and Engaged  Modes of Intervention:  Discussion, Education, Exploration, Problem-Solving, Rapport Building, Support  Summary of Progress/Problems:  Patient actively participated in group. Patient discussed past and present diagnosis and the effects it has had on  life.  Patient talked about family and society being judgmental and the stigma associated with having a mental health diagnosis. He advised he is okay with having bipolar disorder but the psychosis that comes with schizoaffective disorder is difficult to accept.  [name], [name] 05/03/2014  2:33 PM

## 2014-05-03 NOTE — Discharge Summary (Signed)
INTERNAL MEDICINE ATTENDING DISCHARGE COSIGN   I evaluated the patient on the day of discharge and discussed the discharge plan with my resident team. I agree with the discharge documentation and disposition.   Kylene Zamarron 05/03/2014, 9:05 AM
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
## 2014-05-03 NOTE — Progress Notes (Signed)
The focus of this group is to educate the patient on the purpose and policies of crisis stabilization and provide a format to answer questions about their admission.  The group details unit policies and expectations of patients while admitted.  Patient attended 0900 nurse education orientation group this morning.  Patient actively participated, appropriate affect, alert, appropriate insight and engagement.  Today patient will work on 3 goals for discharge.  

## 2014-05-03 NOTE — [facility] Counselor (Signed)
Adult Psychosocial Assessment Update Interdisciplinary Team  Previous Behavior Health Hospital admissions/discharges:  Admissions Discharges  Date: 04/26/14 Date:   05/01/14  Date: Date:  Date: Date:  Date: Date:  Date: Date:   Changes since the last Psychosocial Assessment (including adherence to outpatient mental health and/or substance abuse treatment, situational issues contributing to decompensation and/or relapse). Patient discharged to the hospital on 04/21/14 due to chest pain.  He continues to endorse SI and stated, "if I had a gun, I would kill himself."  Patient is also endorsing depression and anxiety.             Discharge Plan 1. Will you be returning to the same living situation after discharge?   Yes: No:      If no, what is your plan?    Yes.  Patient has a guardian who advised CSW working with him [facility]         2. Would you like a referral for services when you are discharged? Yes:     If yes, for what services?  No:       No.  [facility].       Summary and Recommendations (to be completed by the evaluator) Jason Patterson is a 58 years old Caucasian male admitted with Schizoaffective Disorder.  He will benefit from crisis stabilization, evaluation for medication, psycho-education groups for coping skills development, group therapy and case management for discharge planning.                        Signature:  Jason Patterson, [name], 05/03/2014 9:19 AM

## 2014-05-03 NOTE — Progress Notes (Addendum)
D:  Patient's self inventory sheet, patient has fair sleep, improving appetite, low to normal energy level, good to improving attention span.  Rated depression 8, hopeless 5, anxiety 6.  Denied withdrawals.  SI off/on, contracts for safety.  Does not want to return to same group home.  A:  Medications administered per MD orders.  Emotional support and encouragement given patient. R:  Denied HI.  Denied A/V hallucinations.  Will continue to monitor patient for safety with 15 minute checks.  Safety maintained. Patient stated he would hurt himself if he had to go back to the same home where he is presently living.
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
## 2014-05-03 NOTE — Progress Notes (Signed)
Adult Psychoeducational Group Note  Date:  05/03/2014 Time:  10:37 PM  Group Topic/Focus:  Wrap-Up Group:   The focus of this group is to help patients review their daily goal of treatment and discuss progress on daily workbooks.  Participation Level:  Active  Participation Quality:  Sharing and Supportive  Affect:  Appropriate  Cognitive:  Alert  Insight: Good  Engagement in Group:  Supportive  Modes of Intervention:  Activity  Additional Comments:  PT was in group and was very active and supportive with other PT    Pettress, Delton R 05/03/2014, 10:37 PM

## 2014-05-03 NOTE — Progress Notes (Signed)
D: Patient in the dayroom on approach.  Patient appears calm.  Patient states he had a good day.  Patient states he is still trying to get all of his medications back to how they were ordered when he was here before.  Patient states he attempted to attend groups today.  Patient denies SI/HI and denies AVH tonight. A: Staff to monitor Q 15 mins for safety.  Encouragement and support offered.  Scheduled medications administered per orders.  Klonopin and Tramadol administered prn. R: Patient remains safe on the unit.  Patient attended group tonight.  Patient visible on the unit and interacting with peers.  Patient taking administered medications.

## 2014-05-03 NOTE — Progress Notes (Addendum)
NUTRITION ASSESSMENT  Pt identified as at risk on the Malnutrition Screen Tool  INTERVENTION: 1. Educated patient on the importance of nutrition and encouraged intake of food and beverages.   2. Discussed weight goals. 3. Supplements: none at this time.  NUTRITION DIAGNOSIS: Unintentional weight loss related to sub-optimal intake as evidenced by pt report.   Goal: Pt to meet >/= 90% of their estimated nutrition needs.  Monitor:  PO intake  Assessment:  Patient admitted with Major Depression with psychotic features, BP-depressed.  SI and hates rest home where he lives.  [facility].   Patient reports UBW of 210-215 lbs and was trying to lose weight to 185 lbs by increasing vegetables, decreasing fried foods and decreasing cholesterol.   Currently states that his appetite is improving and has fair to good intake.  For 2 weeks prior to admit reports that he did not eat due to increased depression.  Some of resulting weight loss was purposeful and some related to increased depression.  Upset at rest home because he states that they give him unfair rules that they do not give others.  "I had to sign a paper that if I (acted or talked rudely) they could take me to jail."  58 y.o. male  Height: Ht Readings from Last 1 Encounters:  05/02/14 5\' 7"  (1.702 m)    Weight: Wt Readings from Last 1 Encounters:  05/02/14 204 lb (92.534 kg)    Weight Hx: Wt Readings from Last 10 Encounters:  05/02/14 204 lb (92.534 kg)  05/01/14 199 lb 15.3 oz (90.7 kg)  04/27/14 198 lb (89.812 kg)  01/21/14 215 lb (97.523 kg)  08/17/13 185 lb (83.915 kg)  04/08/13 180 lb (81.647 kg)  05/01/09 176 lb (79.833 kg)    BMI:  Body mass index is 31.94 kg/(m^2). Pt meets criteria for obesity grade 1 based on current BMI.  Estimated Nutritional Needs: Kcal: 25-30 kcal/kg Protein: > 1 gram protein/kg Fluid: 1 ml/kcal  Diet Order: General Pt is also offered choice of  unit snacks mid-morning and mid-afternoon.  Pt is eating as desired.   Lab results and medications reviewed.   Oran ReinLaura Jobe, RD, LDN Clinical Inpatient Dietitian Pager:  [phone] Weekend and after hours pager:  [phone]
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
## 2014-05-04 NOTE — Progress Notes (Signed)
Adult Psychoeducational Group Note  Date:  05/04/2014 Time:  9:28 PM  Group Topic/Focus:  Wrap-Up Group:   The focus of this group is to help patients review their daily goal of treatment and discuss progress on daily workbooks.  Participation Level:  Active  Participation Quality:  Appropriate  Affect:  Appropriate  Cognitive:  Appropriate  Insight: Appropriate  Engagement in Group:  Engaged  Modes of Intervention:  Support  Additional Comments:  Pt stated that he realized today that he is in a better situation now then he what he planned to put himself in, taking his life. And though he still thinks about it he is learning so much from being here and having the support of his peers. His goal for tomorrow is to spend some more time with his peers and to tlk to his social worker about his after discharge care.   Ciceron, Ritchie 05/04/2014, 9:28 PM

## 2014-05-04 NOTE — Progress Notes (Signed)
Adult Activity Group Note  Date: 05/04/14 Time:10am  Group Topic: Art  Participation Level: Minimal  Participation Quality: Attentive and Resistant  Affect:  Blunted and Flat  Activity: Patients asked to create art to coincide with daily unit theme using any combination of markers, crayons, color pencils, construction paper, magazine clippings, glue, and scissors.   Additional Comments: Pts explored summertime and the activities they would like to explore when they leave the hospital. Pts created a list on poster board.

## 2014-05-04 NOTE — [facility] Group Notes (Signed)
[facility]   05/04/2014 8:45 AM  Participation Quality:  Alert, Appropriate and Oriented  Mood/Affect:  Irritable, Depressed  Depression Rating:  10  Anxiety Rating:  10  Thoughts of Suicide:  Pt endorses SI, stating "if d/c he will go to the woods and lay it down and there will be an article in the paper about it", endorses HI stating "if anyone comes too close to him there will be trouble".    Will you contract for safety?   Yes  Current AVH:  Pt denies  Plan for Discharge/Comments:  Pt attended discharge planning group and actively participated in group.  CSW provided pt with today's workbook.  Pt states that he is planning on talking to his guardian today about moving group homes.  CSW informed pt that he is not able to move group homes and this was already discussed with his guardian and pt last week.  Pt became irritable stating the above statements in regards to SI/HI and left the group.  Pt has an ACT team with [facility].  No further needs voiced by pt at this time.    Transportation Means: Pt reports access to transportation  Supports: No supports mentioned at this time  Reyes IvanChelsea Horton, LCSW 05/04/2014 9:59 AM
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
## 2014-05-04 NOTE — [facility] Group Notes (Signed)
[facility] LCSW Group Therapy  05/04/2014  1:15 PM   Type of Therapy:  Group Therapy  Participation Level:  Active  Participation Quality:  Attentive, Sharing and Supportive  Affect:  Depressed and Flat  Cognitive:  Alert and Oriented  Insight:  Developing/Improving and Engaged  Engagement in Therapy:  Developing/Improving and Engaged  Modes of Intervention:  Clarification, Confrontation, Discussion, Education, Exploration, Limit-setting, Orientation, Problem-solving, Rapport Building, Dance movement psychotherapisteality Testing, Socialization and Support  Summary of Progress/Problems: The topic for group today was emotional regulation.  This group focused on both positive and negative emotion identification and allowed group members to process ways to identify feelings, regulate negative emotions, and find healthy ways to manage internal/external emotions. Group members were asked to reflect on a time when their reaction to an emotion led to a negative outcome and explored how alternative responses using emotion regulation would have benefited them. Group members were also asked to discuss a time when emotion regulation was utilized when a negative emotion was experienced.  Pt continued to talk about love vs. Hate and had a hard time connecting to the group topic.  Pt had to be redirected numerous times and spoke circumstantial about love and hate.  Pt attempted to participate in group discussion but lacked being able to talk of anything of substance.    Reyes IvanChelsea Horton, LCSW 05/04/2014  2:40 PM

## 2014-05-04 NOTE — Progress Notes (Signed)
D: Patient in the dayroom on approach.  Patient states his day was much better.  Patient states he is moving forward.  Patient states he hopes he is able to go to a new living facility.  Patient states, "If I have to go back to that place yall will read about me in the newspaper."  Patient states his current living conditions are terrible ans states he cannot go back there.  Patient states he is passive SI but verbally contracts for safety.  Patient denies HI and denies AVH. A: Staff to monitor Q 15 mins for safety.  Encouragement and support offered.  Scheduled medications administered per orders. R: Patient remains safe on the unit.  Patient attended group tonight.  Patient visible on the unit and interacting with peers.  Patient taking administered medications.
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
## 2014-05-04 NOTE — Progress Notes (Signed)
Pt given permission to remove money from his locker.  It was emphasized several times that this would be his last opportunity to retrieve any more items from his locker prior to discharge. Pt. Verbalized that he understood. Pt. Removed the last of his money ($20) from his locker.

## 2014-05-04 NOTE — Progress Notes (Signed)
Adult Psychoeducational Group Note  Date:  05/04/2014 Time:  5:59 PM  Group Topic/Focus:  Personal Choices and Values:   The focus of this group is to help patients assess and explore the importance of values in their lives, how their values affect their decisions, how they express their values and what opposes their expression.  Participation Level:  Did Not Attend    [name], Jason D 05/04/2014, 5:59 PM
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
## 2014-05-04 NOTE — Progress Notes (Signed)
Patient ID: Jason Patterson, male   DOB: 21-Apr-1956, 58 y.o.   MRN: [id number]
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
## 2014-05-05 NOTE — Progress Notes (Signed)
Patient ID: Jason Patterson Patterson, male   DOB: 09-Oct-1956, 58 y.o.   [id number] [facility]  05/05/2014 2:43 PM Jason Patterson  MRN:  [id number] Subjective:   Reports still feeling  depressed Objective: Patient states still depressed, which he attributes to his issue of disliking his Rest [facility]) . As noted, he has made suicidal statements, stating he would cut himself if he has to return to said home. SW has been in contact with Ms. Newt MinionMessinger, patient's guardian, and  Patient's ACT team is scheduled to come today to meet with patient and review. Patient has a good relationship with them. As per note, they are looking at other placement options but this may be difficult due to prior behavioral issues.  Today, patient is stating " I do not want to go back there , but if I have to, I guess I will just have to be a man and take it", and he is less focused on making suicidal threats contingent on disposition issues.  On unit he is pleasant and cooperative, and has not exhibited any self injurious behaviors.  No medication side effects noted.    Total Time spent with patient: 20 minutes  Bipolar Disorder Depressed   ADL's:  Fair, but improving   Sleep: good  Appetite:  Good   Suicidal Ideation:  As above, contingent on being sent back to home he was living in .  Homicidal Ideation:   at this time denies  AEB (as evidenced by):  Psychiatric Specialty Exam: Physical Exam  Review of Systems  Constitutional: Negative for fever and chills.  Respiratory: Negative for shortness of breath.   Cardiovascular: Negative for chest pain and palpitations.  Gastrointestinal: Negative for vomiting.  Psychiatric/Behavioral: Positive for depression and suicidal ideas.    Blood pressure 122/79, pulse 72, temperature 98.5 F (36.9 C), temperature source Oral, resp. rate 20, height 5\' 7"  (1.702 m), weight 92.534 kg (204 lb).Body mass index is 31.94 kg/(m^2).  General Appearance:  Fairly Groomed  Patent attorneyye Contact::  Good  Speech:  Normal  Volume:  Normal  Mood:  Still somewhat depressed   Affect:  Congruent  Thought Process:  Linear and goal directed at this time  Orientation:  Full (Time, Place, and Person)  Thought Content:  denies hallucinations  Suicidal Thoughts:  Today less focused on thoughts of suicide, which have been CONTINGENT on being discharged back to Rest Home. Patient has denied any suicidal thoughts here in unit.   Homicidal Thoughts:  No  Memory:  NA  Judgement:  Fair  Insight:  Fair   Psychomotor Activity:  Normal  Concentration:  Good  Recall:  Good  Fund of Knowledge:Good  Language: Good  Akathisia:  No  Handed:  Right  AIMS (if indicated):     Assets:  Desire for Improvement Resilience  Sleep:  Number of Hours: 5.25   Musculoskeletal: Strength & Muscle Tone: within normal limits- has some bilateral tremors at times, better at others, may be exacerbated by anxiety, may be related to medication management. Mild dyskinetic movements in some fingers.  Gait & Station: normal Patient leans: N/A  Current Medications: Current Facility-Administered Medications  Medication Dose Route Frequency Provider Last Rate Last Dose  . acetaminophen (TYLENOL) tablet 650 mg  650 mg Oral Q6H PRN Kerry HoughSpencer E Simon, PA-C   650 mg at 05/05/14 0930  . albuterol (PROVENTIL HFA;VENTOLIN HFA) 108 (90 BASE) MCG/ACT inhaler 2 puff  2 puff Inhalation Q6H PRN Kerry HoughSpencer E Simon,  PA-C      . alum & mag hydroxide-simeth (MAALOX/MYLANTA) 200-200-20 MG/5ML suspension 30 mL  30 mL Oral Q4H PRN Kerry Hough, PA-C      . asenapine (SAPHRIS) sublingual tablet 10 mg  10 mg Sublingual Q12H PRN Kerry Hough, PA-C      . aspirin EC tablet 81 mg  81 mg Oral Daily Kerry Hough, PA-C   81 mg at 05/05/14 1610  . clonazePAM (KLONOPIN) tablet 1 mg  1 mg Oral BID PRN Kerry Hough, PA-C   1 mg at 05/05/14 0930  . cloNIDine (CATAPRES) tablet 0.1 mg  0.1 mg Oral BID Kerry Hough,  PA-C   0.1 mg at 05/05/14 9604  . gabapentin (NEURONTIN) capsule 300 mg  300 mg Oral BID Kerry Hough, PA-C   300 mg at 05/05/14 5409  . lactulose (CHRONULAC) 10 GM/15ML solution 10 g  10 g Oral Daily Kerry Hough, PA-C   10 g at 05/05/14 0831  . lithium carbonate capsule 600 mg  600 mg Oral BID Kerry Hough, PA-C   600 mg at 05/05/14 8119  . loratadine (CLARITIN) tablet 10 mg  10 mg Oral Daily Kerry Hough, PA-C   10 mg at 05/05/14 0834  . magnesium hydroxide (MILK OF MAGNESIA) suspension 30 mL  30 mL Oral Daily PRN Kerry Hough, PA-C      . mometasone-formoterol (DULERA) 200-5 MCG/ACT inhaler 2 puff  2 puff Inhalation BID Kerry Hough, PA-C   2 puff at 05/05/14 0831  . pantoprazole (PROTONIX) EC tablet 40 mg  40 mg Oral Daily Kerry Hough, PA-C   40 mg at 05/05/14 0834  . tamsulosin (FLOMAX) capsule 0.4 mg  0.4 mg Oral q morning - 10a Mena Goes Simon, PA-C   0.4 mg at 05/05/14 1027  . tiotropium (SPIRIVA) inhalation capsule 18 mcg  18 mcg Inhalation Daily Kerry Hough, PA-C   18 mcg at 05/05/14 0830  . topiramate (TOPAMAX) tablet 50 mg  50 mg Oral Daily Kerry Hough, PA-C   50 mg at 05/05/14 0835  . traMADol (ULTRAM) tablet 50 mg  50 mg Oral TID PRN Kerry Hough, PA-C   50 mg at 05/05/14 1416  . traZODone (DESYREL) tablet 50 mg  50 mg Oral QHS Kerry Hough, PA-C   50 mg at 05/04/14 2215  . Vitamin D (Ergocalciferol) (DRISDOL) capsule 50,000 Units  50,000 Units Oral Q Wed Kerry Hough, PA-C   50,000 Units at 05/04/14 0946  . zolpidem (AMBIEN) tablet 10 mg  10 mg Oral QHS PRN Kerry Hough, PA-C        Lab Results: No results found for this or any previous visit (from the past 48 hour(s)).  Physical Findings: AIMS: Facial and Oral Movements Muscles of Facial Expression: None, normal Lips and Perioral Area: None, normal Jaw: None, normal Tongue: None, normal,Extremity Movements Upper (arms, wrists, hands, fingers): None, normal Lower (legs, knees,  ankles, toes): None, normal, Trunk Movements Neck, shoulders, hips: None, normal, Overall Severity Severity of abnormal movements (highest score from questions above): None, normal Incapacitation due to abnormal movements: None, normal Patient's awareness of abnormal movements (rate only patient's report): No Awareness, Dental Status Current problems with teeth and/or dentures?: No Does patient usually wear dentures?: No  CIWA:  CIWA-Ar Total: 1 COWS:  COWS Total Score: 1  Assessment- Patient remains somewhat depressed, but currently not suicidal . Today,he is less focused and  rigid about attempting  suicide by cutting self if discharged back to same Rest Home, and making statement that if needed he may be able to tolerate it. On the other hand, SW has spoken with guardian and options are being considered , although they may be limited based on prior behavioral issues, and ACT team is scheduled to come meet with patient. No medication side effects.   Treatment Plan Summary: Continue inpatient treatment, milieu, group therapy. Continue  LiC03,  Continue  depot IM antipsychotic , next due on 6/23.  Based on history of Bipolar Spectrum Disorder, will not start an antidepressant medication at this time, but may consider antidepressant trial if continues to describe significant depressive symptoms. Of note, depression seems situational ( ie: related to housing stressors).  Plan: continue current treatment, continue to provide milieu, group therapy  Medical Decision Making Problem Points:  Established problem, stable/improving (1) Data Points:  Review of medication regiment & side effects (2)  I certify that inpatient services furnished can reasonably be expected to improve the patient's condition.   COBOS, FERNANDO 05/05/2014, 2:43 PM

## 2014-05-05 NOTE — [facility] Group Notes (Signed)
[facility] LCSW Group Therapy Note  Date/Time 05/05/14, 1:15pm-2:00pm  Type of Therapy/Topic:  Group Therapy:  Balance in Life  Participation Level:  Monopolizing  Description of Group:    This group will address the concept of balance and how it feels and looks when one is unbalanced. Patients will be encouraged to process areas in their lives that are out of balance, and identify reasons for remaining unbalanced. Facilitators will guide patients utilizing problem- solving interventions to address and correct the stressor making their life unbalanced. Understanding and applying boundaries will be explored and addressed for obtaining  and maintaining a balanced life. Patients will be encouraged to explore ways to assertively make their unbalanced needs known to significant others in their lives, using other group members and facilitator for support and feedback.  Therapeutic Goals: 1. Patient will identify two or more emotions or situations they have that consume much of in their lives. 2. Patient will identify signs/triggers that life has become out of balance:  3. Patient will identify two ways to set boundaries in order to achieve balance in their lives:  4. Patient will demonstrate ability to communicate their needs through discussion and/or role plays  Summary of Patient Progress: Patient presented to group late, but easily integrated self into group.  He required frequent re-direction as he would become tangential and off-topic.  Patient expressed belief that being homeless is primary reason for his life being out of balance, and placed self in role of the victim as he blamed others for home situation being chaotic.  He maintained no responsibility for his life being out of balance, and denied any efforts by CSW to assist him to identify small goals that will allow him to re-gain balance.  He expressed belief that he will become suicidal if he returns home, but presented with expectation for CSW to  place him despite his numerous admissions and awareness of what is a realistic expectation for treatment.   Therapeutic Modalities:   Cognitive Behavioral Therapy Solution-Focused Therapy Assertiveness Training

## 2014-05-05 NOTE — Progress Notes (Signed)
Adult Psychoeducational Group Note  Date:  05/05/2014 Time:  11:29 AM  Group Topic/Focus:  Overcoming Stress:   The focus of this group is to define stress and help patients assess their triggers.  Participation Level:  Did Not Attend   [name], Jason D 05/05/2014, 11:29 AM

## 2014-05-05 NOTE — Progress Notes (Addendum)
Adult Psychoeducational Group Note  Date:  05/05/2014 Time:  6:16 PM  Group Topic/Focus:  Self Esteem Action Plan:   The focus of this group is to help patients create a plan to continue to build self-esteem after discharge.  Participation Level:  Active  Participation Quality:  Appropriate and Attentive  Affect:  Appropriate  Cognitive:  Alert and Appropriate  Insight: Good  Engagement in Group:  Engaged and Improving  Modes of Intervention:  Activity  Additional Comments:  The group played the A-Z game stating things they like about themselves. This pt was excited to write up on the board, TENACIOUS. He said, "that's what the doctor said I was. I don't really know what it means but it sounds good". The group gave him other words that mean the same.  [name], Dorothy D 05/05/2014, 6:16 PM

## 2014-05-05 NOTE — Progress Notes (Signed)
The focus of this group is to educate the patient on the purpose and policies of crisis stabilization and provide a format to answer questions about their admission.  The group details unit policies and expectations of patients while admitted.  Patient attended 0900 nurse education orientation group this morning.  Patient actively participated, appropriate affect, alert, appropriate insight and engagement.  Today patient will work on 3 goals for discharge.  

## 2014-05-05 NOTE — Progress Notes (Signed)
Adult Psychoeducational Group Note  Date:  05/05/2014 Time:  8:00PM Group Topic/Focus:  Wrap-Up Group:   The focus of this group is to help patients review their daily goal of treatment and discuss progress on daily workbooks.  Participation Level:  Active  Participation Quality:  Appropriate and Attentive  Affect:  Appropriate  Cognitive:  Alert and Appropriate  Insight: Appropriate  Engagement in Group:  Engaged  Modes of Intervention:  Discussion  Additional Comments:  Pt was attentive and appropriate during karaoke.    Bing PlumeScott, Latoya D 05/05/2014, 10:05 PM

## 2014-05-06 NOTE — [facility] Group Notes (Signed)
[facility] LCSW Aftercare Discharge Planning Group Note   05/06/2014 11:07 AM  Participation Quality:  Did not attend.     Jason Patterson, Jason Patterson

## 2014-05-06 NOTE — Progress Notes (Signed)
[facility]  Per Tammy, patient became more paranoid prior to admission as he was refusing to eat out of fear that someone was trying to poison him.  She also stated that he was paranoid that someone was stealing his items, but confirmed that all of his items are locked up.  She stated that patient thrives on attention, and will manipulate in order to receive attention from staff.

## 2014-05-06 NOTE — [facility] Group Notes (Signed)
[facility] LCSW Group Therapy  05/06/2014 2:46 PM  Type of Therapy:  Group Therapy  Participation Level:  Active  Participation Quality:  Appropriate, Attentive and Sharing  Affect:  Flat  Cognitive:  Alert, Appropriate and Oriented  Insight:  Developing/Improving  Engagement in Therapy:  Developing/Improving  Modes of Intervention:  Discussion, Exploration, Problem-solving, Socialization and Support  Summary of Progress/Problems: Feelings around Relapse. Group members discussed the meaning of relapse and shared personal stories of relapse, how it affected them and others, and how they perceived themselves during this time. Group members were encouraged to identify triggers, warning signs and coping skills used when facing the possibility of relapse. Social supports were discussed and explored in detail.  Patient was attentive and easily engaged in group.  He presents with awareness of factors that led to relapse and changes to make; however, comments were superificial and he offered no ideas that would demonstrate that he is ready to make changes to prevent future relapse.  Patient's participation and mood changed 20 minutes into group when he asked to leave group.  He stated that he did not feel well and needed to talk to RN.  Patient did not return.   Pervis HockingVenning, Sarah N 05/06/2014, 2:46 PM

## 2014-05-06 NOTE — Progress Notes (Signed)
Adult Psychoeducational Group Note  Date:  05/06/2014 Time:  10:29 PM  Group Topic/Focus:  Goals Group:   The focus of this group is to help patients establish daily goals to achieve during treatment and discuss how the patient can incorporate goal setting into their daily lives to aide in recovery.  Participation Level:  Active  Participation Quality:  Appropriate  Affect:  Appropriate  Cognitive:  Appropriate  Insight: Appropriate  Engagement in Group:  Engaged  Modes of Intervention:  Discussion  Additional Comments:  Pt stated that today was good because of the good conversation with others and time spent with others on the unit.  [name], Malcolm R 05/06/2014, 10:29 PM

## 2014-05-06 NOTE — Progress Notes (Signed)
D: pt c/o of shoulder and back pain rating it 9/10. Pt also having some tremors in hands and feet. Pt thinks it is because of his medication tramadol. Pt vitals are WNL. Pt rates depression 7/10 and hopelessness 6/10. Pt stated he is having si off and on but contracts for safety. Pt did Pharmacist, hospitallet writer know that he has a box cutter out in the woods and that is how he would harm himself. Pt c/o lightheadedness, HA, and dizziness. Pt is appropriate on the unit, pleasant. Pt is having some anxiety and had a mild panic attack during group today. A: klonopin given for anxiety. Tramadol  And tylenol given for pain. Other scheduled medications given. 1:1 time given. Support and Encouragement  offered. q 15 min safety checks R: pt remains safe on unit. No further signs of distress noted.

## 2014-05-06 NOTE — Progress Notes (Signed)
Patient ID: Jason Patterson, male   DOB: 1955/11/28, 58 y.o.   MRN: [id number] [facility] MD Progress Note  05/06/2014 1:38 PM Jalani Rominger  MRN:  [id number] Subjective:   "About the same"  Objective: Continues to be focused on same issue- mainly his dislike for his current rest home and his desire to go somewhere else if possible. However, he is no longer making suicidal threats of cutting himself should he be discharged there, and yesterday and today has made statements that he might need to accept going back there if no other alternatives are available.  SW has  Contacted [employer], patient's guardian, to explore above issues, and discuss options if possible. On unit behavior is in good control. He has had no further episodes of chest pain or discomfort.  On unit he is cooperative, and has not exhibited any self injurious behaviors or overt agitation, although he does express irritability about his issues regarding housing. Denies medication side effects.   Total Time spent with patient: 20 minutes  Bipolar Disorder Depressed   ADL's:  Fair, but improving   Sleep: good  Appetite:  Good   Suicidal Ideation:  As above, contingent on being sent back to home he was living in .  Homicidal Ideation:   at this time denies  AEB (as evidenced by):  Psychiatric Specialty Exam: Physical Exam  Review of Systems  Constitutional: Negative for fever and chills.  Respiratory: Negative for shortness of breath.   Cardiovascular: Negative for chest pain and palpitations.  Gastrointestinal: Negative for vomiting.  Psychiatric/Behavioral: Positive for depression and suicidal ideas.    Blood pressure 129/90, pulse 64, temperature 97.6 F (36.4 C), temperature source Oral, resp. rate 18, height 5\' 7"  (1.702 m), weight 92.534 kg (204 lb).Body mass index is 31.94 kg/(m^2).  General Appearance: Fairly Groomed  Patent attorney::  Good  Speech:  Normal  Volume:  Normal  Mood:  Depressed but  improved compared to admission  Affect:  Congruent  Thought Process:  Linear and goal directed at this time  Orientation:  Full (Time, Place, and Person)  Thought Content:  denies hallucinations  Suicidal Thoughts:  Denies SI on unit and contracts for safety- less focused on making suicidal threats contingent on returning to his group home- see prior notes   Homicidal Thoughts:  No- specifically denies any thoughts of wanting to hurt anyone at above referenced group home   Memory:  NA  Judgement:  Fair  Insight:  Fair   Psychomotor Activity:  Normal  Concentration:  Good  Recall:  Good  Fund of Knowledge:Good  Language: Good  Akathisia:  No  Handed:  Right  AIMS (if indicated):     Assets:  Desire for Improvement Resilience  Sleep:  Number of Hours: 5.25   Musculoskeletal: Strength & Muscle Tone: within normal limits-  Tremors as noted in prior notes- today improved   Gait & Station: normal Patient leans: N/A  Current Medications: Current Facility-Administered Medications  Medication Dose Route Frequency Provider Last Rate Last Dose  . acetaminophen (TYLENOL) tablet 650 mg  650 mg Oral Q6H PRN Kerry Hough, PA-C   650 mg at 05/06/14 0920  . albuterol (PROVENTIL HFA;VENTOLIN HFA) 108 (90 BASE) MCG/ACT inhaler 2 puff  2 puff Inhalation Q6H PRN Kerry Hough, PA-C      . alum & mag hydroxide-simeth (MAALOX/MYLANTA) 200-200-20 MG/5ML suspension 30 mL  30 mL Oral Q4H PRN Kerry Hough, PA-C      . asenapine (  SAPHRIS) sublingual tablet 10 mg  10 mg Sublingual Q12H PRN Kerry HoughSpencer E Simon, PA-C      . aspirin EC tablet 81 mg  81 mg Oral Daily Kerry HoughSpencer E Simon, PA-C   81 mg at 05/06/14 0801  . clonazePAM (KLONOPIN) tablet 1 mg  1 mg Oral BID PRN Kerry HoughSpencer E Simon, PA-C   1 mg at 05/06/14 0806  . cloNIDine (CATAPRES) tablet 0.1 mg  0.1 mg Oral BID Kerry HoughSpencer E Simon, PA-C   0.1 mg at 05/06/14 0801  . gabapentin (NEURONTIN) capsule 300 mg  300 mg Oral BID Kerry HoughSpencer E Simon, PA-C   300 mg at  05/06/14 0802  . lactulose (CHRONULAC) 10 GM/15ML solution 10 g  10 g Oral Daily Kerry HoughSpencer E Simon, PA-C   10 g at 05/06/14 0801  . lithium carbonate capsule 600 mg  600 mg Oral BID Kerry HoughSpencer E Simon, PA-C   600 mg at 05/06/14 0801  . loratadine (CLARITIN) tablet 10 mg  10 mg Oral Daily Kerry HoughSpencer E Simon, PA-C   10 mg at 05/06/14 0802  . magnesium hydroxide (MILK OF MAGNESIA) suspension 30 mL  30 mL Oral Daily PRN Kerry HoughSpencer E Simon, PA-C      . mometasone-formoterol (DULERA) 200-5 MCG/ACT inhaler 2 puff  2 puff Inhalation BID Kerry HoughSpencer E Simon, PA-C   2 puff at 05/06/14 0801  . pantoprazole (PROTONIX) EC tablet 40 mg  40 mg Oral Daily Kerry HoughSpencer E Simon, PA-C   40 mg at 05/06/14 0801  . tamsulosin (FLOMAX) capsule 0.4 mg  0.4 mg Oral q morning - 10a Mena GoesSpencer E Simon, PA-C   0.4 mg at 05/06/14 1000  . tiotropium (SPIRIVA) inhalation capsule 18 mcg  18 mcg Inhalation Daily Kerry HoughSpencer E Simon, PA-C   18 mcg at 05/06/14 0801  . topiramate (TOPAMAX) tablet 50 mg  50 mg Oral Daily Kerry HoughSpencer E Simon, PA-C   50 mg at 05/06/14 0801  . traMADol (ULTRAM) tablet 50 mg  50 mg Oral TID PRN Kerry HoughSpencer E Simon, PA-C   50 mg at 05/06/14 0806  . traZODone (DESYREL) tablet 50 mg  50 mg Oral QHS Kerry HoughSpencer E Simon, PA-C   50 mg at 05/05/14 2355  . Vitamin D (Ergocalciferol) (DRISDOL) capsule 50,000 Units  50,000 Units Oral Q Wed Kerry HoughSpencer E Simon, PA-C   50,000 Units at 05/04/14 0946  . zolpidem (AMBIEN) tablet 10 mg  10 mg Oral QHS PRN Kerry HoughSpencer E Simon, PA-C        Lab Results: No results found for this or any previous visit (from the past 48 hour(s)).  Physical Findings: AIMS: Facial and Oral Movements Muscles of Facial Expression: None, normal Lips and Perioral Area: None, normal Jaw: None, normal Tongue: None, normal,Extremity Movements Upper (arms, wrists, hands, fingers): None, normal Lower (legs, knees, ankles, toes): None, normal, Trunk Movements Neck, shoulders, hips: None, normal, Overall Severity Severity of abnormal  movements (highest score from questions above): None, normal Incapacitation due to abnormal movements: None, normal Patient's awareness of abnormal movements (rate only patient's report): No Awareness, Dental Status Current problems with teeth and/or dentures?: No Does patient usually wear dentures?: No  CIWA:  CIWA-Ar Total: 1 COWS:  COWS Total Score: 1  Assessment-  Partially improved mood and affect- less ruminative about his group home, although still making it clear he dislikes it. He is not making suicidal threats should he be discharged there today, and states he " might just have to accept it" if there are no other options.  Tolerating medications well.  Treatment Plan Summary: Continue inpatient treatment, milieu, group therapy. Continue  LiC03,  Continue   Abilify depot IM antipsychotic , next due on 6/23. ( early next week )   Plan: continue current treatment, continue to provide milieu, group therapy  Medical Decision Making Problem Points:  Established problem, stable/improving (1) Data Points:  Review of medication regiment & side effects (2)  I certify that inpatient services furnished can reasonably be expected to improve the patient's condition.   COBOS, FERNANDO 05/06/2014, 1:38 PM
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
## 2014-05-06 NOTE — Progress Notes (Signed)
D: Pt denies SI/HI/AVH. Pt is pleasant and cooperative. Pt   A: Pt was offered support and encouragement. Pt was given scheduled medications. Pt was encourage to attend groups. Q 15 minute checks were done for safety.  R:Pt attends groups and interacts well with peers and staff. Pt is taking medication. Pt has no complaints.Pt receptive to treatment and safety maintained on unit.

## 2014-05-07 MED ORDER — LIDOCAINE 5 % EX PTCH
1.0000 | MEDICATED_PATCH | CUTANEOUS | Status: DC
  Administered 2014-05-07 – 2014-05-10 (×4): 1 via TRANSDERMAL
  Filled 2014-05-07 (×7): qty 1

## 2014-05-07 MED ORDER — IBUPROFEN 600 MG PO TABS
600.0000 mg | ORAL_TABLET | Freq: Four times a day (QID) | ORAL | Status: DC | PRN
  Administered 2014-05-07 – 2014-05-11 (×5): 600 mg via ORAL
  Filled 2014-05-07 (×5): qty 1

## 2014-05-07 NOTE — Progress Notes (Signed)
D:  Pt +ve SI-contracts for safety.Pt deniesHI/AVH. Pt is pleasant and cooperative. Pt has order for his shoes/shoe strings. Pt stated "I promise I won't do anything while I'm here, I'll sign something if you want. What about my roommate being suicidal, do I need to have my shoes in the room at night". Pt still feels bad and concerned that he may go back to the same group home. Pt stated he has 3 guardians and the one that's calling all the shots is not trying to help him get in a better situation. Pt stated they have not been able to get in touch with the other 2 guardians.   A: Pt was offered support and encouragement. Pt was given scheduled medications. Pt was encourage to attend groups. Q 15 minute checks were done for safety. Pt informed his roommate was on a 1:1 and the sitter can look out for him and keep him safe.   R:Pt attends groups and interacts well with peers and staff. Pt is taking medication.Pt receptive to treatment and safety maintained on unit.

## 2014-05-07 NOTE — Progress Notes (Signed)
.  Psychoeducational Group Note    Date: 05/07/2014 Time:  0930 Goal Setting Purpose of Group: To be able to set a goal that is measurable and that can be accomplished in one day Participation Level:  Active  Participation Quality:  Appropriate  Affect:  Appropriate  Cognitive:  Oriented  Insight:  Improving  Engagement in Group:  Engaged  Additional Comments:  Pt was a little tired in the group. Paid attention and participated when it was his turn.  Judge, Christine A  

## 2014-05-07 NOTE — Progress Notes (Signed)
Adult Psychoeducational Group Note  Date:  05/07/2014 Time:  900 pm  Group Topic/Focus:  Wrap-Up Group:   The focus of this group is to help patients review their daily goal of treatment and discuss progress on daily workbooks.  Participation Level:  Minimal  Participation Quality:  Appropriate  Affect:  Appropriate  Cognitive:  Appropriate  Insight: Appropriate  Engagement in Group:  Engaged  Modes of Intervention:  Discussion  Additional Comments:  Pt reported that he had a good day. Pt requested that he be excused from the remainder of group due to being in a lot of pain. Pt met with his nurse to consult on pain level and possible medications.  Janice Coffin A 05/07/2014, 10:21 PM
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
## 2014-05-07 NOTE — Progress Notes (Signed)
Psychoeducational Group Note  Date: 05/07/2014 Time:  1015  Group Topic/Focus:  Identifying Needs:   The focus of this group is to help patients identify their personal needs that have been historically problematic and identify healthy behaviors to address their needs.  Participation Level:  Active  Participation Quality:  Attentive  Affect:  Flat  Cognitive:  Oriented  Insight:  Improving  Engagement in Group:  Engaged  Additional Comments:  Pt has attended and was a meaningful part of the group and partisipatation  Judge, Christine A  

## 2014-05-07 NOTE — Progress Notes (Signed)
D) Pt has been attendin gthe groups and interacting with his peers appropriately. Rates his depression at an 8 and his hopelessness at a 7. Has thoughts of SI on and off, but will contract for his safety while on the unit. Affect is sad most of the time, but will brighter with conversation. Complains about his back pain and states at times it is just too much for him to bear. States that if he had his shoes, "I feel that I won't hurt as much and I would be able to walk better".Pt continues to be worried about where he will live and states infacticly that "I do not want to return to that nursing home. I would rather die than go back there. I want a new nursing home". A) Given support, reassurance and praise. Provided with a 1:1 and therapeutic humor used with Pt. [facility] we [facility] to find him a new place in which to live. R) Pt contracts for his safety. Interacts with his peers.

## 2014-05-07 NOTE — [facility] Group Notes (Signed)
[facility] Group Notes:  (Clinical Social Work)  05/07/2014   1:15-2:15PM  Summary of Progress/Problems:   The main focus of today's process group was for the patient to identify ways in which they have sabotaged their own mental health wellness/recovery.  Motivational interviewing and a handout were used to explore the benefits and costs of their self-sabotaging behavior as well as the benefits and costs of changing this behavior.  The Stages of Change were explained to the group using a handout, and patients identified where they are with regard to changing self-defeating behaviors.  The patient expressed he self-sabotages with anger, sharing that he is in an anger management class.  He trembled a significant amount while talking, and at the beginning of the group was monopolizing, had to be redirected.  He was insightful in his comments, however.  He then started becoming red in the face, and could be heard to be breathing hard, left the room with a panic attack.  RN was alerted.  He did return to group, did not talk any more, but did listen.  Toward the end of group he had another such attack, but did not leave the room that time.  He also was tearful, and someone got him kleenex.  Type of Therapy:  Process Group  Participation Level:  Active  Participation Quality:  Attentive, Monopolizing and Sharing  Affect:  Anxious, Flat and Tearful  Cognitive:  Alert  Insight:  Improving  Engagement in Therapy:  Engaged  Modes of Intervention:  Education, Motivational Interviewing   Ambrose MantleMareida Grossman-Orr, LCSW 05/07/2014, 4:00pm

## 2014-05-08 MED ORDER — TRAMADOL HCL 50 MG PO TABS
50.0000 mg | ORAL_TABLET | Freq: Three times a day (TID) | ORAL | Status: DC | PRN
  Administered 2014-05-08 – 2014-05-11 (×6): 50 mg via ORAL
  Filled 2014-05-08 (×6): qty 1

## 2014-05-08 MED ORDER — TRAMADOL HCL 50 MG PO TABS
ORAL_TABLET | ORAL | Status: AC
  Filled 2014-05-08: qty 1

## 2014-05-08 NOTE — Progress Notes (Signed)
Pt woke up having panic attack, [facility]. Pt said he was tired of going through the pain and suffering. Writer explained the D/C and 72 hr process. Pt was given klonopin, moral support, and encouragement. Pt seemed to calm down and appeared to feel better.

## 2014-05-08 NOTE — Progress Notes (Signed)
D) Pt continues to be upset and worried where he will go when he leaves here on Tuesday. Affect is flat and mood depressed. Rates his depression at a 9 and his hopelessness at an 8. Continues to have thoughts of SI on and off. Contracts for his safety while on the unit. States, while crying., "I won't go back to that rest home. I would rather sleep out in the woods. A) Given support and encouraged to wait and see what his social worker has to say on Monday and if she was successful in obtaining placement for him  At another rest home. R) Pt verbally contracts for his safety while on the unit

## 2014-05-08 NOTE — [facility] Group Notes (Signed)
[facility] Group Notes: (Clinical Social Work)   05/08/2014      Type of Therapy:  Group Therapy   Participation Level:  Did Not Attend - he did come in for the last few minutes of group, and stated that he intends to return to [facility], along with his anger management classes and meetings with a psychiatrist.  He also wants to work more on communicating his feelings to others.   Ambrose MantleMareida Grossman-Orr, LCSW 05/08/2014, 2:59 PM

## 2014-05-08 NOTE — Progress Notes (Signed)
Psychoeducational Group Note  Date: 05/08/2014 Time: 1015  Group Topic/Focus:  Making Healthy Choices:   The focus of this group is to help patients identify negative/unhealthy choices they were using prior to admission and identify positive/healthier coping strategies to replace them upon discharge.  Participation Level:  Active  Participation Quality:  Attentive  Affect:  Depressed  Cognitive:  Appropriate  Insight:  Engaged  Engagement in Group:  Engaged  Additional Comments:    05/08/2014,10:37 AM Rich Braveuke, Patricia Lynn

## 2014-05-08 NOTE — Progress Notes (Signed)
Patient ID: Jason Patterson, male   DOB: 1956-05-23, 58 y.o.   MRN: [id number] [facility] MD Progress Note  05/08/2014 1:53 PM Jason Patterson  MRN:  [id number] Subjective:   "I have been feeling better overall for sure; my depression is 9/10 and anxiety is 8/10. But my sleep and appetite are way down because I asked them to take me off the tramadol to try it. Well, that was the worst decision because now my pain is awful and I can't sleep or eat.". Pt's RN confirmed that pt did indeed request this trial run of Tramadol cessation and that pt has been visibly in a great deal of pain since that cessation. Pt confirmed that pt had been on 100mg  tid at the nursing home (verified) and that he was on 50mg  bid here which was helping moderately. Pt started back on Tramadol 50mg  tid PRN and informed that we will not go to 100mg  at this point due to psychiatric medications and risk of combined sedative effect.   Objective:  Pt seen and chart reviewed. Pt is ruminating about his pain and recent request to remove Tramadol from his medication profile which he states was a horrible mistake. Pt appears to be visibly in pain while walking and while sitting down as well as exiting the low chair. Pt reports a strong desire not to return to the current living situation and states that social work is assisting him in finding alternative placement.    Total Time spent with patient: 25 minutes  Bipolar Disorder; Depressed   ADL's:  Fair, but improving   Sleep: fair, pain limited  Appetite:  Fair, pain limited  Suicidal Ideation:  As above, contingent on being sent back to home he was living in .  Homicidal Ideation:   at this time denies  AEB (as evidenced by):  Psychiatric Specialty Exam: Physical Exam  Review of Systems  Constitutional: Negative for fever and chills.  Respiratory: Negative for shortness of breath.   Cardiovascular: Negative for chest pain and palpitations.  Gastrointestinal: Negative for  vomiting.  Psychiatric/Behavioral: Positive for depression and suicidal ideas.    Blood pressure 122/83, pulse 88, temperature 97.8 F (36.6 C), temperature source Oral, resp. rate 20, height 5\' 7"  (1.702 m), weight 92.534 kg (204 lb).Body mass index is 31.94 kg/(m^2).  General Appearance: Fairly Groomed  Patent attorney::  Good  Speech:  Normal  Volume:  Normal  Mood:  Depressed but improved compared to admission  Affect:  Congruent  Thought Process:  Linear and goal directed   Orientation:  Full (Time, Place, and Person)  Thought Content:  denies hallucinations  Suicidal Thoughts:  Denies SI on unit and contracts for safety- less focused on making suicidal threats contingent on returning to his group home- see prior notes   Homicidal Thoughts:  No  Memory:  NA  Judgement:  Fair  Insight:  Fair   Psychomotor Activity:  Normal  Concentration:  Good  Recall:  Good  Fund of Knowledge:Good  Language: Good  Akathisia:  No  Handed:  Right  AIMS (if indicated):     Assets:  Desire for Improvement Resilience  Sleep:  Number of Hours: 3.5   Musculoskeletal: Strength & Muscle Tone: within normal limits  Gait & Station: antalgic Patient leans: N/A  Current Medications: Current Facility-Administered Medications  Medication Dose Route Frequency Provider Last Rate Last Dose  . acetaminophen (TYLENOL) tablet 650 mg  650 mg Oral Q6H PRN Kerry Hough, PA-C   650 mg  at 05/07/14 1209  . albuterol (PROVENTIL HFA;VENTOLIN HFA) 108 (90 BASE) MCG/ACT inhaler 2 puff  2 puff Inhalation Q6H PRN Kerry HoughSpencer E Simon, PA-C      . alum & mag hydroxide-simeth (MAALOX/MYLANTA) 200-200-20 MG/5ML suspension 30 mL  30 mL Oral Q4H PRN Kerry HoughSpencer E Simon, PA-C   30 mL at 05/07/14 1636  . asenapine (SAPHRIS) sublingual tablet 10 mg  10 mg Sublingual Q12H PRN Kerry HoughSpencer E Simon, PA-C      . aspirin EC tablet 81 mg  81 mg Oral Daily Kerry HoughSpencer E Simon, PA-C   81 mg at 05/08/14 0820  . clonazePAM (KLONOPIN) tablet 1 mg  1 mg  Oral BID PRN Kerry HoughSpencer E Simon, PA-C   1 mg at 05/08/14 0018  . cloNIDine (CATAPRES) tablet 0.1 mg  0.1 mg Oral BID Kerry HoughSpencer E Simon, PA-C   0.1 mg at 05/08/14 0820  . gabapentin (NEURONTIN) capsule 300 mg  300 mg Oral BID Kerry HoughSpencer E Simon, PA-C   300 mg at 05/08/14 0820  . ibuprofen (ADVIL,MOTRIN) tablet 600 mg  600 mg Oral Q6H PRN Court Joyharles E Kober, PA-C   600 mg at 05/08/14 [id number]  . lactulose (CHRONULAC) 10 GM/15ML solution 10 g  10 g Oral Daily Kerry HoughSpencer E Simon, PA-C   10 g at 05/08/14 [id number]  . lidocaine (LIDODERM) 5 % 1 patch  1 patch Transdermal Q24H Court Joyharles E Kober, PA-C   1 patch at 05/07/14 2137  . lithium carbonate capsule 600 mg  600 mg Oral BID Kerry HoughSpencer E Simon, PA-C   600 mg at 05/08/14 [id number]  . loratadine (CLARITIN) tablet 10 mg  10 mg Oral Daily Kerry HoughSpencer E Simon, PA-C   10 mg at 05/08/14 [id number]  . magnesium hydroxide (MILK OF MAGNESIA) suspension 30 mL  30 mL Oral Daily PRN Kerry HoughSpencer E Simon, PA-C      . mometasone-formoterol [facility](DULERA) 200-5 MCG/ACT inhaler 2 puff  2 puff Inhalation BID Kerry HoughSpencer E Simon, PA-C   2 puff at 05/08/14 [phone]  . pantoprazole (PROTONIX) EC tablet 40 mg  40 mg Oral Daily Kerry HoughSpencer E Simon, PA-C   40 mg at 05/08/14 0820  . tamsulosin (FLOMAX) capsule 0.4 mg  0.4 mg Oral q morning - 10a Kerry HoughSpencer E Simon, PA-C   0.4 mg at 05/08/14 [id number]  . tiotropium (SPIRIVA) inhalation capsule 18 mcg  18 mcg Inhalation Daily Kerry HoughSpencer E Simon, PA-C   18 mcg at 05/08/14 0819  . topiramate (TOPAMAX) tablet 50 mg  50 mg Oral Daily Kerry HoughSpencer E Simon, PA-C   50 mg at 05/08/14 0820  . traMADol (ULTRAM) tablet 50 mg  50 mg Oral Q8H PRN Beau [name], FNP      . traZODone (DESYREL) tablet 50 mg  50 mg Oral QHS Kerry HoughSpencer E Simon, PA-C   50 mg at 05/07/14 2136  . Vitamin D (Ergocalciferol) (DRISDOL) capsule 50,000 Units  50,000 Units Oral Q Wed Kerry HoughSpencer E Simon, PA-C   50,000 Units at 05/04/14 [id number]    Lab Results: No results found for this or any previous visit (from the past 48 hour(s)).  Physical  Findings: AIMS: Facial and Oral Movements Muscles of Facial Expression: None, normal Lips and Perioral Area: None, normal Jaw: None, normal Tongue: None, normal,Extremity Movements Upper (arms, wrists, hands, fingers): None, normal Lower (legs, knees, ankles, toes): None, normal, Trunk Movements Neck, shoulders, hips: None, normal, Overall Severity Severity of abnormal movements (highest score from questions above): None, normal Incapacitation due to abnormal movements: None, normal Patient's awareness  of abnormal movements (rate only patient's report): No Awareness, Dental Status Current problems with teeth and/or dentures?: No Does patient usually wear dentures?: No  CIWA:  CIWA-Ar Total: 1 COWS:  COWS Total Score: 1  Assessment-  Partially improved mood and affect- less ruminative about his group home, although still making it clear he dislikes it. Tolerating medications well but very unhappy that he requested Tramadol to be removed.   Treatment Plan Summary: Continue inpatient treatment, milieu, group therapy and medication management without changes. Continue  LiC03,  Continue   Abilify depot IM antipsychotic , next due on 6/23. ( early next week )   Plan:  Review of chart, vital signs, medications, and notes.  1-Individual and group therapy  2-Medication management for depression and anxiety: Medications reviewed with the patient and he stated no untoward effects. -Restart Tramadol at 50mg  tid PRN for chronic pain (effective before during this visit, this is half home dose verified) 3-Coping skills for depression, anxiety  4-Continue crisis stabilization and management  5-Address health issues--monitoring vital signs, stable  6-Treatment plan in progress to prevent relapse of depression and anxiety  Medical Decision Making Problem Points:  Established problem, stable/improving (1), Review of last therapy session (1) and Review of psycho-social stressors (1) Data Points:   Review or order clinical lab tests (1) Review or order medicine tests (1) Review of medication regiment & side effects (2) Review of new medications or change in dosage (2)  I certify that inpatient services furnished can reasonably be expected to improve the patient's condition.   Beau FannyWithrow, John C, FNP-BC 05/08/2014, 11:49 AM  Reviewed the information documented and agree with the treatment plan.  JONNALAGADDA,JANARDHAHA R. 05/10/2014 9:59 AM

## 2014-05-09 MED ORDER — ARIPIPRAZOLE ER 400 MG IM SUSR
400.0000 mg | INTRAMUSCULAR | Status: DC
  Administered 2014-05-09: 400 mg via INTRAMUSCULAR

## 2014-05-09 NOTE — Progress Notes (Signed)
Adult Psychoeducational Group Note  Date:  05/09/2014 Time:  9:18 PM  Group Topic/Focus:  Wrap-Up Group:   The focus of this group is to help patients review their daily goal of treatment and discuss progress on daily workbooks.  Participation Level:  Active  Participation Quality:  Sharing  Affect:  Appropriate  Cognitive:  Appropriate  Insight: Improving  Engagement in Group:  Engaged  Modes of Intervention:  Clarification, Discussion and Exploration  Additional Comments:  Pt participated in wrap-up group with [facility]. Pt stated that he faced some challenges today and felt like he handled them well. Pt stated that he gave up the location at which he was hiding a weapon and was planning to hurt himself.   Lorin MercyReives, Robert O 05/09/2014, 9:18 PM

## 2014-05-09 NOTE — Progress Notes (Signed)
D: Pt denies SI/HI/AVH. Pt is pleasant and cooperative. Pt  Sat in the day room for a little while tonight, but has spent a lot of time in the bed.   A: Pt was offered support and encouragement. Pt was given scheduled medications. Pt was encourage to attend groups. Q 15 minute checks were done for safety.   R:Pt attends groups and interacts well with peers and staff. Pt is taking medication. Pt receptive to treatment and safety maintained on unit.

## 2014-05-09 NOTE — [facility] Group Notes (Signed)
[facility]   05/09/2014 8:45 AM  Participation Quality:  Alert, Appropriate and Oriented  Mood/Affect:  Flat and Depressed  Depression Rating:  9  Anxiety Rating:  7  Thoughts of Suicide:  Pt endorses off and on SI, denies HI stating he would never hurt a soul  Will you contract for safety?   Yes  Current AVH:  Pt denies  Plan for Discharge/Comments:  Pt attended discharge planning group and actively participated in group.  CSW provided pt with today's workbook.  Pt states that he is still frustrated with his d/c plans, and upset with his guardian and plans to "go against her" with her claims of pt's behaviors.  [facility] to [facility] until alternative placement is found.  Pt has an ACT team with [facility].  No further needs voiced by pt at this time.    Transportation Means: Pt reports access to transportation  Supports: No supports mentioned at this time  Reyes IvanChelsea Horton, LCSW 05/09/2014 10:47 AM

## 2014-05-09 NOTE — Progress Notes (Addendum)
D:  Patient's self inventory sheet, patient stated he has poor sleep, improving appetite, low energy level, good attention span.  Rated depression 8, hopeless 7, anxiety 6.  Denied withdrawals.  SI off/on, contracts for safety.  Stated he has experienced in the past 24 hours, lightheadedness, pain, dizziness, headaches, blurred vision.  Worst pain #9.  Does not have a home.  No discharge plans.  No problems with medications after discharge. A:  Medications administered per MD orders.  Emotional support and encouragement given patient. R:  Denied HI.  SI off/on, contracts for safety.  Denied A/V hallucinations.  Will continue to monitor patient for safety with 15 minute checks.   Patient talked to MD later in the morning and denied SI.    1500  Patient stated he does not know why he said that he would return to that group home but he told 2 nurses that he really does not want to go back to [facility].  Felt that they were in power and he was their subject and that he had no alternative to say anything but what they wanted to hear him say.  Patient [facility] and stated he has off/on SI thoughts.  Patient stated he also told Act team where his weapon was hid.  He knows the first thing they will do is go after his weapon and then try to take him back there.  Stated he is at a loss how to fight this thing.  Act team said checks were stolen and cashed, which he did not do.  Stated he would take a polygraph to prove them all wrong and then take them to court.

## 2014-05-09 NOTE — [facility] Group Notes (Signed)
[facility] LCSW Group Therapy  05/09/2014 1:15 PM   Type of Therapy:  Group Therapy  Participation Level:  Did Not Attend - pt sleeping in his room  Jason IvanChelsea Horton, LCSW 05/09/2014 3:02 PM

## 2014-05-09 NOTE — Progress Notes (Addendum)
Patient ID: Jason Patterson, male   DOB: [birth year], 58 y.o.   MRN: [id number] [facility] MD Progress Note  05/09/2014 1:46 PM Jason Patterson  MRN:  [id number] Subjective:   Patient describes some improvement Objective: Although still stating that he is unhappy with potentially having to return to [facility], he is no longer making suicidal threats contingent on this. He states " I have realized I have been through a lot already and If I am alive its because God has plans for me, so I can't take my own life because I have some purpose for him"  He also denies any thoughts of violence or any homicidal ideations towards anyone at said Rest [facility]. He states " I am going to tell them where than box cutter is so they can take it". ( Patient had threatened to cut self with a   Box cutter last week ) . With patient's express consent I have left message for patient's legal guardian, [name], at [phone], [phone]. SW has spoken with patient's legal guardian on the phone about above issues as well. On unit behavior is in good control. He has not acted out disruptively nor exhibited any self injurious behaviors.  He  Denies  episodes of chest pain or discomfort.  Denies medication side effects. Of note, patient has been on Abilify IM depot monthly injection for several months- he states it works well for him and denies side effects. He is due for his next shot today or tomorrow.    Total Time spent with patient: 20 minutes  Bipolar Disorder Depressed   ADL's:  Fair, but improving   Sleep: good  Appetite:  Good   Suicidal Ideation:  Currently denies SI- see above  Homicidal Ideation:   at this time denies any HI- see above  AEB (as evidenced by):  Psychiatric Specialty Exam: Physical Exam  Review of Systems  Constitutional: Negative for fever and chills.  Respiratory: Negative for cough and shortness of breath.   Cardiovascular: Negative for chest pain and palpitations.  Gastrointestinal:  Negative for vomiting.  Psychiatric/Behavioral: Positive for depression. Negative for suicidal ideas and hallucinations. The patient is nervous/anxious.     Blood pressure 148/98, pulse 63, temperature 97 F (36.1 C), temperature source Oral, resp. rate 20, height 5\' 7"  (1.702 m), weight 92.534 kg (204 lb).Body mass index is 31.94 kg/(m^2).  General Appearance: slowly improving   Eye Contact::  Good  Speech:  Normal  Volume:  Normal  Mood:  Less depressed   Affect:  More reactive, smiles appropriately more often- better related overall  Thought Process:  Linear   Orientation:  Full (Time, Place, and Person)  Thought Content:  denies hallucinations, no delusions expressed   Suicidal Thoughts:  Denies SI on unit and  Today also denies any SI even if he does have to return to [facility] after discharge  Homicidal Thoughts:  No- specifically denies any thoughts of wanting to hurt anyone at above referenced group home   Memory:  NA  Judgement:  Fair  Insight:  Fair   Psychomotor Activity:  Normal  Concentration:  Good  Recall:  Good  Fund of Knowledge:Good  Language: Good  Akathisia:  No  Handed:  Right  AIMS (if indicated):     Assets:  Desire for Improvement Resilience  Sleep:  Number of Hours: 5.75   Musculoskeletal: Strength & Muscle Tone: within normal limits-  Chronic distal tremors, which vary from day to day   Gait &  Station: normal Patient leans: N/A  Current Medications: Current Facility-Administered Medications  Medication Dose Route Frequency Provider Last Rate Last Dose  . acetaminophen (TYLENOL) tablet 650 mg  650 mg Oral Q6H PRN Kerry HoughSpencer E Simon, PA-C   650 mg at 05/08/14 2233  . albuterol (PROVENTIL HFA;VENTOLIN HFA) 108 (90 BASE) MCG/ACT inhaler 2 puff  2 puff Inhalation Q6H PRN Kerry HoughSpencer E Simon, PA-C      . alum & mag hydroxide-simeth (MAALOX/MYLANTA) 200-200-20 MG/5ML suspension 30 mL  30 mL Oral Q4H PRN Kerry HoughSpencer E Simon, PA-C   30 mL at 05/09/14 1104  .  asenapine (SAPHRIS) sublingual tablet 10 mg  10 mg Sublingual Q12H PRN Kerry HoughSpencer E Simon, PA-C      . aspirin EC tablet 81 mg  81 mg Oral Daily Kerry HoughSpencer E Simon, PA-C   81 mg at 05/09/14 0752  . clonazePAM (KLONOPIN) tablet 1 mg  1 mg Oral BID PRN Kerry HoughSpencer E Simon, PA-C   1 mg at 05/08/14 2234  . cloNIDine (CATAPRES) tablet 0.1 mg  0.1 mg Oral BID Kerry HoughSpencer E Simon, PA-C   0.1 mg at 05/09/14 0753  . gabapentin (NEURONTIN) capsule 300 mg  300 mg Oral BID Kerry HoughSpencer E Simon, PA-C   300 mg at 05/09/14 0753  . ibuprofen (ADVIL,MOTRIN) tablet 600 mg  600 mg Oral Q6H PRN Court Joyharles E Kober, PA-C   600 mg at 05/09/14 0758  . lactulose (CHRONULAC) 10 GM/15ML solution 10 g  10 g Oral Daily Kerry HoughSpencer E Simon, PA-C   10 g at 05/09/14 0750  . lidocaine (LIDODERM) 5 % 1 patch  1 patch Transdermal Q24H Court Joyharles E Kober, PA-C   1 patch at 05/08/14 2236  . lithium carbonate capsule 600 mg  600 mg Oral BID Kerry HoughSpencer E Simon, PA-C   600 mg at 05/09/14 0754  . loratadine (CLARITIN) tablet 10 mg  10 mg Oral Daily Kerry HoughSpencer E Simon, PA-C   10 mg at 05/09/14 0754  . magnesium hydroxide (MILK OF MAGNESIA) suspension 30 mL  30 mL Oral Daily PRN Kerry HoughSpencer E Simon, PA-C      . mometasone-formoterol (DULERA) 200-5 MCG/ACT inhaler 2 puff  2 puff Inhalation BID Kerry HoughSpencer E Simon, PA-C   2 puff at 05/09/14 0751  . pantoprazole (PROTONIX) EC tablet 40 mg  40 mg Oral Daily Kerry HoughSpencer E Simon, PA-C   40 mg at 05/09/14 0755  . tamsulosin (FLOMAX) capsule 0.4 mg  0.4 mg Oral q morning - 10a Kerry HoughSpencer E Simon, PA-C   0.4 mg at 05/09/14 0949  . tiotropium (SPIRIVA) inhalation capsule 18 mcg  18 mcg Inhalation Daily Kerry HoughSpencer E Simon, PA-C   18 mcg at 05/09/14 0750  . topiramate (TOPAMAX) tablet 50 mg  50 mg Oral Daily Kerry HoughSpencer E Simon, PA-C   50 mg at 05/09/14 0755  . traMADol (ULTRAM) tablet 50 mg  50 mg Oral Q8H PRN Beau [name], FNP   50 mg at 05/09/14 0246  . traZODone (DESYREL) tablet 50 mg  50 mg Oral QHS Kerry HoughSpencer E Simon, PA-C   50 mg at 05/08/14 2234  .  Vitamin D (Ergocalciferol) (DRISDOL) capsule 50,000 Units  50,000 Units Oral Q Wed Kerry HoughSpencer E Simon, PA-C   50,000 Units at 05/04/14 [id number]    Lab Results: No results found for this or any previous visit (from the past 48 hour(s)).  Physical Findings: AIMS: Facial and Oral Movements Muscles of Facial Expression: None, normal Lips and Perioral Area: None, normal Jaw: None, normal Tongue: None,  normal,Extremity Movements Upper (arms, wrists, hands, fingers): None, normal Lower (legs, knees, ankles, toes): None, normal, Trunk Movements Neck, shoulders, hips: None, normal, Overall Severity Severity of abnormal movements (highest score from questions above): None, normal Incapacitation due to abnormal movements: None, normal Patient's awareness of abnormal movements (rate only patient's report): No Awareness, Dental Status Current problems with teeth and/or dentures?: No Does patient usually wear dentures?: No  CIWA:  CIWA-Ar Total: 1 COWS:  COWS Total Score: 1  Assessment-  Mood and affect are slowly improving , and he is no longer reporting suicidal threats contingent upon returning to his [facility] He is not presenting with any overt psychotic symptoms at this time and his behavior on unit is in good control. He has tolerated Abilify IM depot monthly well and today is scheduled for new dose.   Treatment Plan Summary: Continue inpatient treatment, milieu, group therapy. Treatment team working on discharge planning  as he improves.  Continue  LiC03,  Continue   Abilify depot IM antipsychotic , next due on 6/23. ( early next week )   Plan: continue current treatment, continue to provide milieu, group therapy  Medical Decision Making Problem Points:  Established problem, stable/improving (1) Data Points:  Review of new medications or change in dosage (2)  I certify that inpatient services furnished can reasonably be expected to improve the patient's condition.   COBOS,  FERNANDO 05/09/2014, 1:46 PM  Of note, Abilify Maintena not on regular formulary- will discuss with pharmacist.

## 2014-05-09 NOTE — Progress Notes (Signed)
Adult Psychoeducational Group Note  Date:  05/09/2014 Time:  4:44 PM  Group Topic/Focus:  Dimensions of Wellness:   The focus of this group is to introduce the topic of wellness and discuss the role each dimension of wellness plays in total health.  Participation Level:  Minimal  Participation Quality:  Attentive until he left the group for an unknown reason.   [name], Dorothy D 05/09/2014, 4:44 PM

## 2014-05-10 NOTE — Progress Notes (Signed)
Adult Psychoeducational Group Note  Date:  05/10/2014 Time:  8:00PM Group Topic/Focus:  Wrap-Up Group:   The focus of this group is to help patients review their daily goal of treatment and discuss progress on daily workbooks.  Participation Level:  Active  Participation Quality:  Appropriate, Attentive and Sharing  Affect:  Appropriate  Cognitive:  Alert and Appropriate  Insight: Appropriate  Engagement in Group:  Engaged  Modes of Intervention:  Discussion  Additional Comments:  Pt. Was attentive and appropriate during tonight's group discussion. Pt stated that today was fairly well. Pt shared that he is going back to the rest home. Pt was encouraged to purchase a safe and lock for his valuable items. Pt shared how he tired of people stealing from him. Pt was able to get ideas from group members to lock up his items, purchase a safe and dont leave anything of value laying around in plain view. Out of site out of mind.   Bing PlumeScott, Latoya D 05/10/2014, 9:28 PM

## 2014-05-10 NOTE — Progress Notes (Signed)
The focus of this group is to educate the patient on the purpose and policies of crisis stabilization and provide a format to answer questions about their admission.  The group details unit policies and expectations of patients while admitted.  Patient attended 0900 nurse education orientation group this morning.  Patient actively participated, appropriate affect, alert, appropriate insight and engagement.  Today patient will work on 3 goals for discharge.  

## 2014-05-10 NOTE — Progress Notes (Addendum)
D:  Patient's self inventory sheet, patient has poor sleep, improving appetite, low energy level, good attention span.  Rated depression 8, hopeless 6, anxiety 10.  Denied withdrawals.  SI off/on, contracts for safety.  In the past 24 hours has experienced lightheadedness, pain, dizziness, headache, blurred vision.  Worst pain 9.  Patient is homeless.  No discharge plans.  No problems taking meds after discharge. A:  Medications administered per MD orders.  Emotional support and encouragement given patient. R:  Denied HI.  SI off/on, contracts for safety.  Will continue to monitor patient for safety with 15 minute checks.  Safety maintained.    Left arm tremors.  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

## 2014-05-10 NOTE — Progress Notes (Signed)
Recreation Therapy Notes  Animal-Assisted Activity/Therapy (AAA/T) Program Checklist/Progress Notes Patient Eligibility Criteria Checklist & Daily Group note for Rec Tx Intervention  Date: 06.23.2015 Time: 3:15pm Location: [address] Dayroom    AAA/T Program Assumption of Risk Form signed by Patient/ or Parent Legal Guardian yes  Patient is free of allergies or sever asthma yes  Patient reports no fear of animals yes  Patient reports no history of cruelty to animals yes   Patient understands his/her participation is voluntary yes  Behavioral Response: Did not attend.    Denise L Blanchfield, LRT/CTRS  Blanchfield, Denise L 05/10/2014 5:00 PM 
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
## 2014-05-10 NOTE — BHH Group Notes (Signed)
[facility]  05/10/2014 3:31 PM  Type of Therapy:  Group Therapy  Participation Level:  Did Not Attend   [name], Jason Hairston 05/10/2014, 3:31 PM

## 2014-05-11 MED ORDER — TOPIRAMATE 50 MG PO TABS: 50.0000 mg | ORAL_TABLET | Freq: Every day | ORAL | Status: DC

## 2014-05-11 MED ORDER — CLONIDINE HCL 0.1 MG PO TABS: 0.1000 mg | ORAL_TABLET | Freq: Two times a day (BID) | ORAL | Status: DC

## 2014-05-11 MED ORDER — TAMSULOSIN HCL 0.4 MG PO CAPS: 0.4000 mg | ORAL_CAPSULE | Freq: Every morning | ORAL | Status: DC

## 2014-05-11 MED ORDER — TRAMADOL HCL 50 MG PO TABS: 50.0000 mg | ORAL_TABLET | Freq: Three times a day (TID) | ORAL | Status: DC | PRN

## 2014-05-11 MED ORDER — LITHIUM CARBONATE 300 MG PO CAPS: 600.0000 mg | ORAL_CAPSULE | Freq: Two times a day (BID) | ORAL | Status: DC

## 2014-05-11 MED ORDER — GABAPENTIN 300 MG PO CAPS: 300.0000 mg | ORAL_CAPSULE | Freq: Two times a day (BID) | ORAL | Status: DC

## 2014-05-11 MED ORDER — ASPIRIN 81 MG PO TBEC: 81.0000 mg | DELAYED_RELEASE_TABLET | Freq: Every day | ORAL | Status: DC

## 2014-05-11 MED ORDER — TRAZODONE HCL 50 MG PO TABS: 50.0000 mg | ORAL_TABLET | Freq: Every day | ORAL | Status: DC

## 2014-05-11 MED ORDER — FLUTICASONE-SALMETEROL 500-50 MCG/DOSE IN AEPB: 1.0000 | INHALATION_SPRAY | Freq: Two times a day (BID) | RESPIRATORY_TRACT | Status: DC

## 2014-05-11 MED ORDER — ERGOCALCIFEROL 1.25 MG (50000 UT) PO CAPS: 50000.0000 [IU] | ORAL_CAPSULE | ORAL | Status: DC

## 2014-05-11 MED ORDER — TIOTROPIUM BROMIDE MONOHYDRATE 18 MCG IN CAPS: 18.0000 ug | ORAL_CAPSULE | Freq: Every day | RESPIRATORY_TRACT | Status: DC

## 2014-05-11 MED ORDER — ARIPIPRAZOLE ER 400 MG IM SUSR: 400.0000 mg | INTRAMUSCULAR | Status: DC

## 2014-05-11 MED ORDER — OMEPRAZOLE 40 MG PO CPDR: 40.0000 mg | DELAYED_RELEASE_CAPSULE | Freq: Every day | ORAL | Status: DC

## 2014-05-11 MED ORDER — CETIRIZINE HCL 10 MG PO TABS: 10.0000 mg | ORAL_TABLET | Freq: Every morning | ORAL | Status: DC

## 2014-05-11 NOTE — BHH Group Notes (Signed)
[facility]   05/11/2014 12:56 PM    Participation Quality:  Appropraite  Mood/Affect:  Appropriate  Depression Rating:  10  Anxiety Rating:  10  Thoughts of Suicide:  No  Will you contract for safety?   NA  Current AVH:  No  Plan for Discharge/Comments:  Patient attended discharge planning group and actively participated in group. Patient upset that he has to [facility].  [facility]. CSW provided all participants with daily workbook.   Transportation Means: Patient has transportation.   Supports:  Patient has a support system.   Jason Patterson, Jason Patterson

## 2014-05-11 NOTE — Progress Notes (Signed)
Discharge Note: Discharge instructions & prescriptions given to patient. Patient verbalized understanding of discharge instructions and prescriptions. Returned belongings to patient. Denies SI/HI/AVH. Patient d/c without incident to the lobby and transported by a representative of the group home where the patient resides.

## 2014-05-11 NOTE — Clinical Social Work Note (Signed)
CSW spoke with Claudia DesanctisStacy Skardki at [facility].  She as informed for intent to discharge patient from the hospital today.  She advised patient is to return [facility].  CSW asked Ms. Skardki to fax a copy of guardianship forms for patient's chart to which she agreed.  CSW spoke with Zella [facility]Robin at [facility] to advise patient discharging today.  She asked that [facility] be contacted for transportation.  CSW contacted [facility] who [facility] around 15:00 today.

## 2014-05-11 NOTE — Progress Notes (Signed)
[facility] Dis[facility] :  Will you be returning to the same living situation after discharge: Yes,  Patient to [facility] At discharge, do you have transportation home?:Yes,  Patient to be [facility] Do you have the ability to pay for your medications:Yes,  Patient is able to obtain medicaitons.  Release of information consent forms completed and in the chart;  Patient's signature needed at discharge.  Patient to Follow up at: Follow-up Information   Follow up with Dr. Omelia BlackwaterHeaden [facility] On 05/13/2014. (You are scheduled to see Dr. Omelia BlackwaterHeaden on Friday, May 14, 2015 at 10:00 AM)    Contact information:   [address] [place], [place]   [id number]  [phone]      Patient denies SI/H:  Patient continues to endorse SI due to having to return to [facility].  Safety Planning and Suicide Prevention discussed:  .Reviewed with all patients during discharge planning group   Hodnett, Joesph JulyQuylle Hairston 05/11/2014, 12:54 PM
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
## 2014-05-13 NOTE — Progress Notes (Signed)
Patient Discharge Instructions:  After Visit Summary (AVS):   Faxed to:  05/13/14 Psychiatric Admission Assessment Note:   Faxed to:  05/13/14 Suicide Risk Assessment - Discharge Assessment:   Faxed to:  05/13/14 Faxed/Sent to the Next Level Care provider:  05/13/14 Faxed to [facility] @ [phone]  Jerelene ReddenSheena E [place], 05/13/2014, 2:33 PM

## 2014-05-14 ENCOUNTER — Encounter (HOSPITAL_COMMUNITY): Payer: Self-pay | Admitting: Emergency Medicine

## 2014-05-14 ENCOUNTER — Emergency Department (HOSPITAL_COMMUNITY)
Admission: EM | Admit: 2014-05-14 | Discharge: 2014-05-14 | Disposition: A | Discharge status: Home / Self Care | Payer: Medicaid Other | Attending: Emergency Medicine | Admitting: Emergency Medicine

## 2014-05-14 DIAGNOSIS — F17200 Nicotine dependence, unspecified, uncomplicated: Secondary | ICD-10-CM | POA: Insufficient documentation

## 2014-05-14 DIAGNOSIS — Z872 Personal history of diseases of the skin and subcutaneous tissue: Secondary | ICD-10-CM | POA: Insufficient documentation

## 2014-05-14 DIAGNOSIS — IMO0002 Reserved for concepts with insufficient information to code with codable children: Secondary | ICD-10-CM | POA: Insufficient documentation

## 2014-05-14 DIAGNOSIS — Z880 Allergy status to penicillin: Secondary | ICD-10-CM | POA: Insufficient documentation

## 2014-05-14 DIAGNOSIS — F259 Schizoaffective disorder, unspecified: Secondary | ICD-10-CM | POA: Insufficient documentation

## 2014-05-14 DIAGNOSIS — Z79899 Other long term (current) drug therapy: Secondary | ICD-10-CM | POA: Insufficient documentation

## 2014-05-14 DIAGNOSIS — K219 Gastro-esophageal reflux disease without esophagitis: Secondary | ICD-10-CM | POA: Insufficient documentation

## 2014-05-14 DIAGNOSIS — Z8669 Personal history of other diseases of the nervous system and sense organs: Secondary | ICD-10-CM | POA: Insufficient documentation

## 2014-05-14 DIAGNOSIS — F319 Bipolar disorder, unspecified: Secondary | ICD-10-CM | POA: Insufficient documentation

## 2014-05-14 DIAGNOSIS — Z7982 Long term (current) use of aspirin: Secondary | ICD-10-CM | POA: Insufficient documentation

## 2014-05-14 DIAGNOSIS — N139 Obstructive and reflux uropathy, unspecified: Secondary | ICD-10-CM | POA: Insufficient documentation

## 2014-05-14 DIAGNOSIS — J449 Chronic obstructive pulmonary disease, unspecified: Secondary | ICD-10-CM | POA: Insufficient documentation

## 2014-05-14 DIAGNOSIS — F411 Generalized anxiety disorder: Secondary | ICD-10-CM | POA: Insufficient documentation

## 2014-05-14 DIAGNOSIS — J4489 Other specified chronic obstructive pulmonary disease: Secondary | ICD-10-CM | POA: Insufficient documentation

## 2014-05-14 DIAGNOSIS — I10 Essential (primary) hypertension: Secondary | ICD-10-CM | POA: Insufficient documentation

## 2014-05-14 LAB — URINALYSIS, ROUTINE W REFLEX MICROSCOPIC
Bilirubin Urine: NEGATIVE
Glucose, UA: NEGATIVE mg/dL
Ketones, ur: NEGATIVE mg/dL
Leukocytes, UA: NEGATIVE
Nitrite: NEGATIVE
Protein, ur: NEGATIVE mg/dL
Urobilinogen, UA: 0.2 mg/dL (ref 0.0–1.0)
pH: 6.5 (ref 5.0–8.0)

## 2014-05-14 LAB — URINE MICROSCOPIC-ADD ON

## 2014-05-14 NOTE — ED Notes (Signed)
Patient given instructions on how and when to use leg bag on foley cath. Demonstrated how to change and empty bag. Day time written on leg bag for patient to remember when to use leg bag. Patient also demonstrated how to empty foley bag and verbalized when to use bag and how to do cath care when cleaning.

## 2014-05-14 NOTE — ED Notes (Signed)
Reports flank and groin pain intermittently x 1 month. Denies blood in urine, but reports dysuria.

## 2014-05-14 NOTE — Discharge Instructions (Signed)
Foley Catheter Care, Adult °A Foley catheter is a soft, flexible tube that is placed into the bladder to drain urine. A Foley catheter may be inserted if: °· You leak urine or are not able to control when you urinate (urinary incontinence). °· You are not able to urinate when you need to (urinary retention). °· You had prostate surgery or surgery on the genitals. °· You have certain medical conditions, such as multiple sclerosis, dementia, or a spinal cord injury. °If you are going home with a Foley catheter in place, follow the instructions below. °TAKING CARE OF THE CATHETER °1. Wash your hands with soap and water. °2. Using mild soap and warm water on a clean washcloth: °· Clean the area on your body closest to the catheter insertion site using a circular motion, moving away from the catheter. Never wipe toward the catheter because this could sweep bacteria up into the urethra and cause infection. °· Remove all traces of soap. Pat the area dry with a clean towel. For males, reposition the foreskin. °3. Attach the catheter to your leg so there is no tension on the catheter. Use adhesive tape or a leg strap. If you are using adhesive tape, remove any sticky residue left behind by the previous tape you used. °4. Keep the drainage bag below the level of the bladder, but keep it off the floor. °5. Check throughout the day to be sure the catheter is working and urine is draining freely. Make sure the tubing does not become kinked. °6. Do not pull on the catheter or try to remove it. Pulling could damage internal tissues. °TAKING CARE OF THE DRAINAGE BAGS °You will be given two drainage bags to take home. One is a large overnight drainage bag, and the other is a smaller leg bag that fits underneath clothing. You may wear the overnight bag at any time, but you should never wear the smaller leg bag at night. Follow the instructions below for how to empty, change, and clean your drainage bags. °Emptying the Drainage  Bag °You must empty your drainage bag when it is  -½ full or at least 2-3 times a day. °1. Wash your hands with soap and water. °2. Keep the drainage bag below your hips, below the level of your bladder. This stops urine from going back into the tubing and into your bladder. °3. Hold the dirty bag over the toilet or a clean container. °4. Open the pour spout at the bottom of the bag and empty the urine into the toilet or container. Do not let the pour spout touch the toilet, container, or any other surface. Doing so can place bacteria on the bag, which can cause an infection. °5. Clean the pour spout with a gauze pad or cotton ball that has rubbing alcohol on it. °6. Close the pour spout. °7. Attach the bag to your leg with adhesive tape or a leg strap. °8. Wash your hands well. °Changing the Drainage Bag °Change your drainage bag once a month or sooner if it starts to smell bad or look dirty. Below are steps to follow when changing the drainage bag. °1. Wash your hands with soap and water. °2. Pinch off the rubber catheter so that urine does not spill out. °3. Disconnect the catheter tube from the drainage tube at the connection valve. Do not let the tubes touch any surface. °4. Clean the end of the catheter tube with an alcohol wipe. Use a different alcohol wipe to clean   the end of the drainage tube. °5. Connect the catheter tube to the drainage tube of the clean drainage bag. °6. Attach the new bag to the leg with adhesive tape or a leg strap. Avoid attaching the new bag too tightly. °7. Wash your hands well. °Cleaning the Drainage Bag °1. Wash your hands with soap and water. °2. Wash the bag in warm, soapy water. °3. Rinse the bag thoroughly with warm water. °4. Fill the bag with a solution of white vinegar and water (1 cup vinegar to 1 qt warm water [.2 L vinegar to 1 L warm water]). Close the bag and soak it for 30 minutes in the solution. °5. Rinse the bag with warm water. °6. Hang the bag to dry with the  pour spout open and hanging downward. °7. Store the clean bag (once it is dry) in a clean plastic bag. °8. Wash your hands well. °PREVENTING INFECTION °· Wash your hands before and after handling your catheter. °· Take showers daily and wash the area where the catheter enters your body. Do not take baths. Replace wet leg straps with dry ones, if this applies. °· Do not use powders, sprays, or lotions on the genital area. Only use creams, lotions, or ointments as directed by your caregiver. °· For females, wipe from front to back after each bowel movement. °· Drink enough fluids to keep your urine clear or pale yellow unless you have a fluid restriction. °· Do not let the drainage bag or tubing touch or lie on the floor. °· Wear cotton underwear to absorb moisture and to keep your skin drier. °SEEK MEDICAL CARE IF:  °· Your urine is cloudy or smells unusually bad. °· Your catheter becomes clogged. °· You are not draining urine into the bag or your bladder feels full. °· Your catheter starts to leak. °SEEK IMMEDIATE MEDICAL CARE IF:  °· You have pain, swelling, redness, or pus where the catheter enters the body. °· You have pain in the abdomen, legs, lower back, or bladder. °· You have a fever. °· You see blood fill the catheter, or your urine is pink or red. °· You have nausea, vomiting, or chills. °· Your catheter gets pulled out. °MAKE SURE YOU:  °· Understand these instructions. °· Will watch your condition. °· Will get help right away if you are not doing well or get worse. °Document Released: 11/04/2005 Document Revised: 03/01/2013 Document Reviewed: 10/26/2012 °Exit[employer]® Patient Information ©2015 [employer]. This information is not intended to replace advice given to you by your health care provider. Make sure you discuss any questions you have with your health care provider. ° °
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
## 2014-05-19 ENCOUNTER — Encounter (HOSPITAL_COMMUNITY): Payer: Self-pay | Admitting: Emergency Medicine

## 2014-05-19 ENCOUNTER — Emergency Department (HOSPITAL_COMMUNITY)
Admission: EM | Admit: 2014-05-19 | Discharge: 2014-05-19 | Disposition: A | Discharge status: Home / Self Care | Payer: Medicaid Other | Attending: Emergency Medicine | Admitting: Emergency Medicine

## 2014-05-19 DIAGNOSIS — F17200 Nicotine dependence, unspecified, uncomplicated: Secondary | ICD-10-CM | POA: Insufficient documentation

## 2014-05-19 DIAGNOSIS — J449 Chronic obstructive pulmonary disease, unspecified: Secondary | ICD-10-CM | POA: Insufficient documentation

## 2014-05-19 DIAGNOSIS — Z7982 Long term (current) use of aspirin: Secondary | ICD-10-CM | POA: Insufficient documentation

## 2014-05-19 DIAGNOSIS — I10 Essential (primary) hypertension: Secondary | ICD-10-CM | POA: Insufficient documentation

## 2014-05-19 DIAGNOSIS — G40909 Epilepsy, unspecified, not intractable, without status epilepticus: Secondary | ICD-10-CM | POA: Insufficient documentation

## 2014-05-19 DIAGNOSIS — IMO0002 Reserved for concepts with insufficient information to code with codable children: Secondary | ICD-10-CM | POA: Insufficient documentation

## 2014-05-19 DIAGNOSIS — Y846 Urinary catheterization as the cause of abnormal reaction of the patient, or of later complication, without mention of misadventure at the time of the procedure: Secondary | ICD-10-CM | POA: Insufficient documentation

## 2014-05-19 DIAGNOSIS — Z466 Encounter for fitting and adjustment of urinary device: Secondary | ICD-10-CM | POA: Insufficient documentation

## 2014-05-19 DIAGNOSIS — Z79899 Other long term (current) drug therapy: Secondary | ICD-10-CM | POA: Insufficient documentation

## 2014-05-19 DIAGNOSIS — F259 Schizoaffective disorder, unspecified: Secondary | ICD-10-CM | POA: Insufficient documentation

## 2014-05-19 DIAGNOSIS — Z880 Allergy status to penicillin: Secondary | ICD-10-CM | POA: Insufficient documentation

## 2014-05-19 DIAGNOSIS — Z872 Personal history of diseases of the skin and subcutaneous tissue: Secondary | ICD-10-CM | POA: Insufficient documentation

## 2014-05-19 DIAGNOSIS — J4489 Other specified chronic obstructive pulmonary disease: Secondary | ICD-10-CM | POA: Insufficient documentation

## 2014-05-19 DIAGNOSIS — K219 Gastro-esophageal reflux disease without esophagitis: Secondary | ICD-10-CM | POA: Insufficient documentation

## 2014-05-19 DIAGNOSIS — F411 Generalized anxiety disorder: Secondary | ICD-10-CM | POA: Insufficient documentation

## 2014-05-19 DIAGNOSIS — F319 Bipolar disorder, unspecified: Secondary | ICD-10-CM | POA: Insufficient documentation

## 2014-05-19 LAB — URINALYSIS, ROUTINE W REFLEX MICROSCOPIC
Bilirubin Urine: NEGATIVE
GLUCOSE, UA: NEGATIVE mg/dL
KETONES UR: NEGATIVE mg/dL
LEUKOCYTES UA: NEGATIVE
NITRITE: NEGATIVE
PH: 7 (ref 5.0–8.0)
Protein, ur: NEGATIVE mg/dL
Specific Gravity, Urine: <1.005 — ABNORMAL LOW (ref 1.005–1.030)
Urobilinogen, UA: 0.2 mg/dL (ref 0.0–1.0)

## 2014-05-19 LAB — URINE MICROSCOPIC-ADD ON

## 2014-05-19 NOTE — ED Notes (Signed)
Pt had a foley placed here last Saturday for urinary retention and possible prostate problems.  Pt states he has had pain in his penis since the catheter was placed and also lower abd/pelvic pain due to same.
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
## 2014-05-26 ENCOUNTER — Other Ambulatory Visit: Payer: Self-pay | Admitting: Neurosurgery

## 2014-06-06 ENCOUNTER — Encounter (HOSPITAL_COMMUNITY): Payer: Self-pay | Admitting: Emergency Medicine

## 2014-06-06 ENCOUNTER — Emergency Department (INDEPENDENT_AMBULATORY_CARE_PROVIDER_SITE_OTHER)
Admission: EM | Admit: 2014-06-06 | Discharge: 2014-06-06 | Disposition: A | Discharge status: Home / Self Care | Payer: Medicaid Other | Source: Home / Self Care | Attending: Family Medicine | Admitting: Family Medicine

## 2014-06-06 DIAGNOSIS — S81009A Unspecified open wound, unspecified knee, initial encounter: Secondary | ICD-10-CM

## 2014-06-06 DIAGNOSIS — S91009A Unspecified open wound, unspecified ankle, initial encounter: Secondary | ICD-10-CM

## 2014-06-06 DIAGNOSIS — IMO0002 Reserved for concepts with insufficient information to code with codable children: Secondary | ICD-10-CM

## 2014-06-06 DIAGNOSIS — S81809A Unspecified open wound, unspecified lower leg, initial encounter: Secondary | ICD-10-CM

## 2014-06-06 DIAGNOSIS — L089 Local infection of the skin and subcutaneous tissue, unspecified: Secondary | ICD-10-CM

## 2014-06-06 DIAGNOSIS — S81802A Unspecified open wound, left lower leg, initial encounter: Principal | ICD-10-CM

## 2014-06-06 MED ORDER — SULFAMETHOXAZOLE-TMP DS 800-160 MG PO TABS: 1.0000 | ORAL_TABLET | Freq: Two times a day (BID) | ORAL | Status: DC

## 2014-06-06 MED ORDER — CEPHALEXIN 500 MG PO CAPS: 1000.0000 mg | ORAL_CAPSULE | Freq: Three times a day (TID) | ORAL | Status: DC

## 2014-06-06 MED ORDER — HYDROCODONE-ACETAMINOPHEN 5-325 MG PO TABS: 1.0000 | ORAL_TABLET | Freq: Four times a day (QID) | ORAL | Status: DC | PRN
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
## 2014-06-06 NOTE — Discharge Instructions (Signed)
Wound Infection A wound infection happens when a type of germ (bacteria) starts growing in the wound. In some cases, this can cause the wound to break open. If cared for properly, the infected wound will heal from the inside to the outside. Wound infections need treatment. CAUSES An infection is caused by bacteria growing in the wound.  SYMPTOMS   Increase in redness, swelling, or pain at the wound site.  Increase in drainage at the wound site.  Wound or bandage (dressing) starts to smell bad.  Fever.  Feeling tired or fatigued.  Pus draining from the wound. TREATMENT  Your health care provider will prescribe antibiotic medicine. The wound infection should improve within 24 to 48 hours. Any redness around the wound should stop spreading and the wound should be less painful.  HOME CARE INSTRUCTIONS   Only take over-the-counter or prescription medicines for pain, discomfort, or fever as directed by your health care provider.  Take your antibiotics as directed. Finish them even if you start to feel better.  Gently wash the area with mild soap and water 2 times a day, or as directed. Rinse off the soap. Pat the area dry with a clean towel. Do not rub the wound. This may cause bleeding.  Follow your health care provider's instructions for how often you need to change the dressing.  Apply ointment and a dressing to the wound as directed.  If the dressing sticks, moisten it with soapy water and gently remove it.  Change the bandage right away if it becomes wet, dirty, or develops a bad smell.  Take showers. Do not take tub baths, swim, or do anything that may soak the wound until it is healed.  Avoid exercises that make you sweat heavily.  Use anti-itch medicine as directed by your health care provider. The wound may itch when it is healing. Do not pick or scratch at the wound.  Follow up with your health care provider to get your wound rechecked as directed. SEEK MEDICAL CARE  IF:  You have an increase in swelling, pain, or redness around the wound.  You have an increase in the amount of pus coming from the wound.  There is a bad smell coming from the wound.  More of the wound breaks open.  You have a fever. MAKE SURE YOU:   Understand these instructions.  Will watch your condition.  Will get help right away if you are not doing well or get worse. Document Released: 08/03/2003 Document Revised: 11/09/2013 Document Reviewed: 03/10/2011 [facility] Patient Information 2015 [place]. This information is not intended to replace advice given to you by your health care provider. Make sure you discuss any questions you have with your health care provider.  Wound Care Wound care helps prevent pain and infection.  You may need a tetanus shot if:  You cannot remember when you had your last tetanus shot.  You have never had a tetanus shot.  The injury broke your skin. If you need a tetanus shot and you choose not to have one, you may get tetanus. Sickness from tetanus can be serious. HOME CARE   Only take medicine as told by your doctor.  Clean the wound daily with mild soap and water.  Change any bandages (dressings) as told by your doctor.  Put medicated cream and a bandage on the wound as told by your doctor.  Change the bandage if it gets wet, dirty, or starts to smell.  Take showers. Do not take  baths, swim, or do anything that puts your wound under water.  Rest and raise (elevate) the wound until the pain and puffiness (swelling) are better.  Keep all doctor visits as told. GET HELP RIGHT AWAY IF:   Yellowish-white fluid (pus) comes from the wound.  Medicine does not lessen your pain.  There is a red streak going away from the wound.  You have a fever. MAKE SURE YOU:   Understand these instructions.  Will watch your condition.  Will get help right away if you are not doing well or get worse. Document Released: 08/13/2008  Document Revised: 01/27/2012 Document Reviewed: 03/10/2011 [facility] Patient Information 2015 [place], [place]. This information is not intended to replace advice given to you by your health care provider. Make sure you discuss any questions you have with your health care provider.

## 2014-06-07 NOTE — ED Provider Notes (Signed)
Medical screening examination/treatment/procedure(s) were performed by resident physician or non-physician practitioner and as supervising physician I was immediately available for consultation/collaboration.   Jeimy Bickert DOUGLAS MD.   Trini Soldo D Skiler Olden, MD 06/07/14 1127 

## 2014-06-09 LAB — WOUND CULTURE
Culture: NO GROWTH
GRAM STAIN: NONE SEEN

## 2014-06-10 ENCOUNTER — Encounter (HOSPITAL_COMMUNITY): Payer: Self-pay | Admitting: Pharmacy Technician

## 2014-06-14 ENCOUNTER — Other Ambulatory Visit (HOSPITAL_COMMUNITY): Payer: Self-pay | Admitting: *Deleted

## 2014-06-14 NOTE — Pre-Procedure Instructions (Signed)
Willy EddyGordon L Bents Jr.  06/14/2014   Your procedure is scheduled on:  Thursday, June 23, 2014 at 12:30 PM.   [facility] "A" Admitting Office at 10:30 AM.   Call this number if you have problems the morning of surgery: [phone]   Remember:   Do not eat food or drink liquids after midnight Wednesday, 06/22/14.   Take these medicines the morning of surgery with A SIP OF WATER: INHALER, ZYRTEC, CLONAZEPAM IF NEEDED, CLONIDINE, GABAPENTIN, HYDROCODONE OR ULTRAM  IF NEEDED, LITHIUM, OMEPRAZOLE, FLOMAX, TOPAMAX,   Stop Aspirin as of today.   Do not wear jewelry.  Do not wear lotions, powders, or cologne. You may wear deodorant.  Do not shave 48 hours prior to surgery. Men may shave face and neck.  Do not bring valuables to the hospital.  [facility] is not responsible                  for any belongings or valuables.               Contacts, dentures or bridgework may not be worn into surgery.  Leave suitcase in the car. After surgery it may be brought to your room.  For patients admitted to the hospital, discharge time is determined by your                treatment team.               Patients discharged the day of surgery will not be allowed to drive  home.  Name and phone number of your driver: Family/friend   Special Instructions: [place] - Preparing for Surgery  Before surgery, you can play an important role.  Because skin is not sterile, your skin needs to be as free of germs as possible.  You can reduce the number of germs on you skin by washing with CHG (chlorahexidine gluconate) soap before surgery.  CHG is an antiseptic cleaner which kills germs and bonds with the skin to continue killing germs even after washing.  Please DO NOT use if you have an allergy to CHG or antibacterial soaps.  If your skin becomes reddened/irritated stop using the CHG and inform your nurse when you arrive at Short Stay.  Do not shave (including legs and underarms) for  at least 48 hours prior to the first CHG shower.  You may shave your face.  Please follow these instructions carefully:   1.  Shower with CHG Soap the night before surgery and the                                morning of Surgery.  2.  If you choose to wash your hair, wash your hair first as usual with your       normal shampoo.  3.  After you shampoo, rinse your hair and body thoroughly to remove the                      Shampoo.  4.  Use CHG as you would any other liquid soap.  You can apply chg directly       to the skin and wash gently with scrungie or a clean washcloth.  5.  Apply the CHG Soap to your body ONLY FROM THE NECK DOWN.        Do not use on open wounds or open sores.  Avoid contact with your eyes, ears, mouth and genitals (private parts).  Wash genitals (private parts) with your normal soap.  6.  Wash thoroughly, paying special attention to the area where your surgery        will be performed.  7.  Thoroughly rinse your body with warm water from the neck down.  8.  DO NOT shower/wash with your normal soap after using and rinsing off       the CHG Soap.  9.  Pat yourself dry with a clean towel.            10.  Wear clean pajamas.            11.  Place clean sheets on your bed the night of your first shower and do not        sleep with pets.  Day of Surgery  Do not apply any lotions the morning of surgery.  Please wear clean clothes to the hospital/surgery center.     Please read over the following fact sheets that you were given: Pain Booklet, Coughing and Deep Breathing and Surgical Site Infection Prevention

## 2014-06-15 ENCOUNTER — Encounter (HOSPITAL_COMMUNITY): Payer: Self-pay

## 2014-06-15 ENCOUNTER — Encounter (HOSPITAL_COMMUNITY)
Admission: RE | Admit: 2014-06-15 | Discharge: 2014-06-15 | Disposition: A | Discharge status: Home / Self Care | Payer: Medicaid Other | Source: Ambulatory Visit | Attending: Neurosurgery | Admitting: Neurosurgery

## 2014-06-15 DIAGNOSIS — Z01812 Encounter for preprocedural laboratory examination: Secondary | ICD-10-CM | POA: Insufficient documentation

## 2014-06-15 DIAGNOSIS — Z01818 Encounter for other preprocedural examination: Secondary | ICD-10-CM | POA: Insufficient documentation

## 2014-06-15 HISTORY — DX: Anxiety disorder, unspecified: F41.9

## 2014-06-15 HISTORY — DX: Shortness of breath: R06.02

## 2014-06-15 HISTORY — DX: Cardiac arrhythmia, unspecified: I49.9

## 2014-06-15 HISTORY — DX: Headache: R51

## 2014-06-15 LAB — CBC
HEMATOCRIT: 43.6 % (ref 39.0–52.0)
HEMOGLOBIN: 14.3 g/dL (ref 13.0–17.0)
MCH: 31.6 pg (ref 26.0–34.0)
MCHC: 32.8 g/dL (ref 30.0–36.0)
MCV: 96.5 fL (ref 78.0–100.0)
Platelets: 259 10*3/uL (ref 150–400)
RBC: 4.52 MIL/uL (ref 4.22–5.81)
RDW: 14.9 % (ref 11.5–15.5)
WBC: 5.6 10*3/uL (ref 4.0–10.5)

## 2014-06-15 LAB — BASIC METABOLIC PANEL
Anion gap: 12 (ref 5–15)
BUN: 8 mg/dL (ref 6–23)
CHLORIDE: 100 meq/L (ref 96–112)
CO2: 24 meq/L (ref 19–32)
CREATININE: 1.24 mg/dL (ref 0.50–1.35)
Calcium: 9.4 mg/dL (ref 8.4–10.5)
GFR calc non Af Amer: 62 mL/min — ABNORMAL LOW (ref >90)
GFR, EST AFRICAN AMERICAN: 72 mL/min — AB (ref >90)
Glucose, Bld: 86 mg/dL (ref 70–99)
POTASSIUM: 4.7 meq/L (ref 3.7–5.3)
Sodium: 136 mEq/L — ABNORMAL LOW (ref 137–147)

## 2014-06-15 NOTE — Progress Notes (Signed)
06/15/14 1131  OBSTRUCTIVE SLEEP APNEA  Score 4 or greater  Results sent to PCP

## 2014-06-15 NOTE — Progress Notes (Signed)
06/15/14 1020  OBSTRUCTIVE SLEEP APNEA  Have you ever been diagnosed with sleep apnea through a sleep study? No  Do you snore loudly (loud enough to be heard through closed doors)?  0  Do you often feel tired, fatigued, or sleepy during the daytime? 0  Has anyone observed you stop breathing during your sleep? 0  Do you have, or are you being treated for high blood pressure? 1  BMI more than 35 kg/m2? 0  Age over 645 years old? 1  Neck circumference greater than 40 cm/16 inches? 1  Gender: 1  Obstructive Sleep Apnea Score 4  Score 4 or greater  Results sent to PCP

## 2014-06-15 NOTE — Progress Notes (Signed)
FAXED STOP BANG TOOL TO [facility].

## 2014-06-22 MED ORDER — VANCOMYCIN HCL IN DEXTROSE 1-5 GM/200ML-% IV SOLN
1000.0000 mg | INTRAVENOUS | Status: AC
  Administered 2014-06-23: 1000 mg via INTRAVENOUS
  Filled 2014-06-22: qty 200

## 2014-06-23 ENCOUNTER — Encounter (HOSPITAL_COMMUNITY)
Admission: RE | Disposition: A | Discharge status: Other Acute Inpatient Hospital | Payer: Self-pay | Source: Ambulatory Visit | Attending: Neurosurgery

## 2014-06-23 ENCOUNTER — Encounter (HOSPITAL_COMMUNITY): Payer: Medicaid Other | Admitting: Anesthesiology

## 2014-06-23 ENCOUNTER — Encounter (HOSPITAL_COMMUNITY): Payer: Self-pay | Admitting: *Deleted

## 2014-06-23 ENCOUNTER — Observation Stay (HOSPITAL_COMMUNITY)
Admission: RE | Admit: 2014-06-23 | Discharge: 2014-06-23 | Payer: Medicaid Other | Source: Ambulatory Visit | Attending: Neurosurgery | Admitting: Neurosurgery

## 2014-06-23 ENCOUNTER — Ambulatory Visit (HOSPITAL_COMMUNITY): Payer: Medicaid Other | Admitting: Anesthesiology

## 2014-06-23 DIAGNOSIS — K219 Gastro-esophageal reflux disease without esophagitis: Secondary | ICD-10-CM | POA: Diagnosis not present

## 2014-06-23 DIAGNOSIS — F319 Bipolar disorder, unspecified: Secondary | ICD-10-CM | POA: Diagnosis not present

## 2014-06-23 DIAGNOSIS — G5622 Lesion of ulnar nerve, left upper limb: Secondary | ICD-10-CM | POA: Diagnosis present

## 2014-06-23 DIAGNOSIS — Z888 Allergy status to other drugs, medicaments and biological substances status: Secondary | ICD-10-CM | POA: Diagnosis not present

## 2014-06-23 DIAGNOSIS — Z79899 Other long term (current) drug therapy: Secondary | ICD-10-CM | POA: Diagnosis not present

## 2014-06-23 DIAGNOSIS — Z791 Long term (current) use of non-steroidal anti-inflammatories (NSAID): Secondary | ICD-10-CM | POA: Diagnosis not present

## 2014-06-23 DIAGNOSIS — J4489 Other specified chronic obstructive pulmonary disease: Secondary | ICD-10-CM | POA: Insufficient documentation

## 2014-06-23 DIAGNOSIS — F17200 Nicotine dependence, unspecified, uncomplicated: Secondary | ICD-10-CM | POA: Insufficient documentation

## 2014-06-23 DIAGNOSIS — F209 Schizophrenia, unspecified: Secondary | ICD-10-CM | POA: Insufficient documentation

## 2014-06-23 DIAGNOSIS — J449 Chronic obstructive pulmonary disease, unspecified: Secondary | ICD-10-CM | POA: Insufficient documentation

## 2014-06-23 DIAGNOSIS — F411 Generalized anxiety disorder: Secondary | ICD-10-CM | POA: Diagnosis not present

## 2014-06-23 DIAGNOSIS — IMO0002 Reserved for concepts with insufficient information to code with codable children: Secondary | ICD-10-CM | POA: Diagnosis not present

## 2014-06-23 DIAGNOSIS — G5620 Lesion of ulnar nerve, unspecified upper limb: Principal | ICD-10-CM | POA: Insufficient documentation

## 2014-06-23 DIAGNOSIS — Z7982 Long term (current) use of aspirin: Secondary | ICD-10-CM | POA: Insufficient documentation

## 2014-06-23 DIAGNOSIS — Z91013 Allergy to seafood: Secondary | ICD-10-CM | POA: Insufficient documentation

## 2014-06-23 DIAGNOSIS — Z880 Allergy status to penicillin: Secondary | ICD-10-CM | POA: Insufficient documentation

## 2014-06-23 DIAGNOSIS — I10 Essential (primary) hypertension: Secondary | ICD-10-CM | POA: Insufficient documentation

## 2014-06-23 HISTORY — PX: ULNAR NERVE TRANSPOSITION: SHX2595

## 2014-06-23 SURGERY — ULNAR NERVE DECOMPRESSION/TRANSPOSITION
Anesthesia: General | Site: Arm Lower | Laterality: Right

## 2014-06-23 MED ORDER — ONDANSETRON HCL 4 MG/2ML IJ SOLN
INTRAMUSCULAR | Status: AC
  Filled 2014-06-23: qty 2

## 2014-06-23 MED ORDER — NEOSTIGMINE METHYLSULFATE 10 MG/10ML IV SOLN
INTRAVENOUS | Status: AC
  Filled 2014-06-23: qty 1

## 2014-06-23 MED ORDER — PROPOFOL 10 MG/ML IV BOLUS
INTRAVENOUS | Status: DC | PRN
  Administered 2014-06-23: 160 mg via INTRAVENOUS

## 2014-06-23 MED ORDER — ONDANSETRON HCL 4 MG/2ML IJ SOLN
INTRAMUSCULAR | Status: DC | PRN
  Administered 2014-06-23: 4 mg via INTRAVENOUS

## 2014-06-23 MED ORDER — MIDAZOLAM HCL 2 MG/2ML IJ SOLN
INTRAMUSCULAR | Status: AC
  Filled 2014-06-23: qty 2

## 2014-06-23 MED ORDER — MIDAZOLAM HCL 5 MG/5ML IJ SOLN
INTRAMUSCULAR | Status: DC | PRN
  Administered 2014-06-23: 2 mg via INTRAVENOUS

## 2014-06-23 MED ORDER — FENTANYL CITRATE 0.05 MG/ML IJ SOLN
INTRAMUSCULAR | Status: AC
  Filled 2014-06-23: qty 5

## 2014-06-23 MED ORDER — LIDOCAINE-EPINEPHRINE 0.5 %-1:200000 IJ SOLN
INTRAMUSCULAR | Status: DC | PRN
  Administered 2014-06-23: 7 mL

## 2014-06-23 MED ORDER — GLYCOPYRROLATE 0.2 MG/ML IJ SOLN
INTRAMUSCULAR | Status: DC | PRN
  Administered 2014-06-23: .8 mg via INTRAVENOUS

## 2014-06-23 MED ORDER — ROCURONIUM BROMIDE 100 MG/10ML IV SOLN
INTRAVENOUS | Status: DC | PRN
  Administered 2014-06-23: 50 mg via INTRAVENOUS

## 2014-06-23 MED ORDER — SODIUM CHLORIDE 0.9 % IJ SOLN
INTRAMUSCULAR | Status: AC
  Filled 2014-06-23: qty 10

## 2014-06-23 MED ORDER — ONDANSETRON HCL 4 MG PO TABS
4.0000 mg | ORAL_TABLET | Freq: Three times a day (TID) | ORAL | Status: DC | PRN
  Filled 2014-06-23: qty 1

## 2014-06-23 MED ORDER — LACTATED RINGERS IV SOLN
INTRAVENOUS | Status: DC | PRN
  Administered 2014-06-23 (×2): via INTRAVENOUS

## 2014-06-23 MED ORDER — LIDOCAINE HCL (CARDIAC) 20 MG/ML IV SOLN
INTRAVENOUS | Status: DC | PRN
  Administered 2014-06-23: 80 mg via INTRAVENOUS

## 2014-06-23 MED ORDER — GLYCOPYRROLATE 0.2 MG/ML IJ SOLN
INTRAMUSCULAR | Status: AC
  Filled 2014-06-23: qty 1

## 2014-06-23 MED ORDER — LIDOCAINE HCL (CARDIAC) 20 MG/ML IV SOLN
INTRAVENOUS | Status: AC
  Filled 2014-06-23: qty 5

## 2014-06-23 MED ORDER — FENTANYL CITRATE 0.05 MG/ML IJ SOLN
INTRAMUSCULAR | Status: DC | PRN
  Administered 2014-06-23: 50 ug via INTRAVENOUS

## 2014-06-23 MED ORDER — EPHEDRINE SULFATE 50 MG/ML IJ SOLN
INTRAMUSCULAR | Status: AC
  Filled 2014-06-23: qty 1

## 2014-06-23 MED ORDER — OXYCODONE HCL 5 MG/5ML PO SOLN: 5.0000 mg | Freq: Once | ORAL | Status: DC | PRN

## 2014-06-23 MED ORDER — ACETAMINOPHEN-CODEINE #3 300-30 MG PO TABS: 1.0000 | ORAL_TABLET | Freq: Four times a day (QID) | ORAL | Status: DC | PRN

## 2014-06-23 MED ORDER — 0.9 % SODIUM CHLORIDE (POUR BTL) OPTIME
TOPICAL | Status: DC | PRN
  Administered 2014-06-23: 1000 mL

## 2014-06-23 MED ORDER — HYDROMORPHONE HCL PF 1 MG/ML IJ SOLN: 0.2500 mg | INTRAMUSCULAR | Status: DC | PRN

## 2014-06-23 MED ORDER — PROPOFOL 10 MG/ML IV BOLUS
INTRAVENOUS | Status: AC
  Filled 2014-06-23: qty 20

## 2014-06-23 MED ORDER — EPHEDRINE SULFATE 50 MG/ML IJ SOLN
INTRAMUSCULAR | Status: DC | PRN
  Administered 2014-06-23 (×5): 5 mg via INTRAVENOUS

## 2014-06-23 MED ORDER — OXYCODONE HCL 5 MG PO TABS: 5.0000 mg | ORAL_TABLET | Freq: Once | ORAL | Status: DC | PRN

## 2014-06-23 MED ORDER — GLYCOPYRROLATE 0.2 MG/ML IJ SOLN
INTRAMUSCULAR | Status: AC
  Filled 2014-06-23: qty 3

## 2014-06-23 MED ORDER — LACTATED RINGERS IV SOLN
INTRAVENOUS | Status: DC
  Administered 2014-06-23: 12:00:00 via INTRAVENOUS

## 2014-06-23 MED ORDER — NEOSTIGMINE METHYLSULFATE 10 MG/10ML IV SOLN
INTRAVENOUS | Status: DC | PRN
  Administered 2014-06-23: 5 mg via INTRAVENOUS

## 2014-06-23 MED ORDER — SODIUM CHLORIDE 0.9 % IV SOLN
INTRAVENOUS | Status: DC | PRN
  Administered 2014-06-23: 13:00:00 via INTRAVENOUS

## 2014-06-23 MED ORDER — POTASSIUM CHLORIDE IN NACL 20-0.9 MEQ/L-% IV SOLN: INTRAVENOUS | Status: DC

## 2014-06-23 MED ORDER — PROMETHAZINE HCL 25 MG/ML IJ SOLN: 6.2500 mg | INTRAMUSCULAR | Status: DC | PRN

## 2014-06-23 SURGICAL SUPPLY — 72 items
BANDAGE ELASTIC 3 VELCRO ST LF (GAUZE/BANDAGES/DRESSINGS) IMPLANT
BLADE SURG 10 STRL SS (BLADE) ×2 IMPLANT
BLADE SURG 15 STRL LF DISP TIS (BLADE) ×1 IMPLANT
BLADE SURG 15 STRL SS (BLADE) ×1
BNDG GAUZE ELAST 4 BULKY (GAUZE/BANDAGES/DRESSINGS) IMPLANT
CORDS BIPOLAR (ELECTRODE) ×2 IMPLANT
DECANTER SPIKE VIAL GLASS SM (MISCELLANEOUS) ×2 IMPLANT
DERMABOND ADHESIVE PROPEN (GAUZE/BANDAGES/DRESSINGS) ×1
DERMABOND ADVANCED (GAUZE/BANDAGES/DRESSINGS)
DERMABOND ADVANCED .7 DNX12 (GAUZE/BANDAGES/DRESSINGS) IMPLANT
DERMABOND ADVANCED .7 DNX6 (GAUZE/BANDAGES/DRESSINGS) ×1 IMPLANT
DRAPE EXTREMITY T 121X128X90 (DRAPE) ×2 IMPLANT
DRSG TELFA 3X8 NADH (GAUZE/BANDAGES/DRESSINGS) IMPLANT
DURAPREP 26ML APPLICATOR (WOUND CARE) ×2 IMPLANT
ELECT CAUTERY BLADE 6.4 (BLADE) ×2 IMPLANT
GAUZE SPONGE 4X4 12PLY STRL (GAUZE/BANDAGES/DRESSINGS) IMPLANT
GAUZE SPONGE 4X4 16PLY XRAY LF (GAUZE/BANDAGES/DRESSINGS) ×2 IMPLANT
GLOVE BIO SURGEON STRL SZ 6.5 (GLOVE) IMPLANT
GLOVE BIO SURGEON STRL SZ7 (GLOVE) IMPLANT
GLOVE BIO SURGEON STRL SZ7.5 (GLOVE) IMPLANT
GLOVE BIO SURGEON STRL SZ8 (GLOVE) IMPLANT
GLOVE BIO SURGEON STRL SZ8.5 (GLOVE) IMPLANT
GLOVE BIOGEL M 8.0 STRL (GLOVE) IMPLANT
GLOVE BIOGEL PI IND STRL 6.5 (GLOVE) ×1 IMPLANT
GLOVE BIOGEL PI IND STRL 7.0 (GLOVE) ×1 IMPLANT
GLOVE BIOGEL PI IND STRL 7.5 (GLOVE) ×2 IMPLANT
GLOVE BIOGEL PI INDICATOR 6.5 (GLOVE) ×1
GLOVE BIOGEL PI INDICATOR 7.0 (GLOVE) ×1
GLOVE BIOGEL PI INDICATOR 7.5 (GLOVE) ×2
GLOVE ECLIPSE 6.5 STRL STRAW (GLOVE) ×2 IMPLANT
GLOVE ECLIPSE 7.0 STRL STRAW (GLOVE) IMPLANT
GLOVE ECLIPSE 7.5 STRL STRAW (GLOVE) IMPLANT
GLOVE ECLIPSE 8.0 STRL XLNG CF (GLOVE) IMPLANT
GLOVE ECLIPSE 8.5 STRL (GLOVE) IMPLANT
GLOVE EXAM NITRILE LRG STRL (GLOVE) IMPLANT
GLOVE EXAM NITRILE MD LF STRL (GLOVE) IMPLANT
GLOVE EXAM NITRILE XL STR (GLOVE) IMPLANT
GLOVE EXAM NITRILE XS STR PU (GLOVE) IMPLANT
GLOVE INDICATOR 6.5 STRL GRN (GLOVE) IMPLANT
GLOVE INDICATOR 7.0 STRL GRN (GLOVE) IMPLANT
GLOVE INDICATOR 7.5 STRL GRN (GLOVE) IMPLANT
GLOVE INDICATOR 8.0 STRL GRN (GLOVE) IMPLANT
GLOVE INDICATOR 8.5 STRL (GLOVE) IMPLANT
GLOVE OPTIFIT SS 8.0 STRL (GLOVE) IMPLANT
GLOVE SURG SS PI 6.5 STRL IVOR (GLOVE) IMPLANT
GLOVE SURG SS PI 7.0 STRL IVOR (GLOVE) ×6 IMPLANT
GOWN STRL REUS W/ TWL LRG LVL3 (GOWN DISPOSABLE) ×3 IMPLANT
GOWN STRL REUS W/ TWL XL LVL3 (GOWN DISPOSABLE) IMPLANT
GOWN STRL REUS W/TWL 2XL LVL3 (GOWN DISPOSABLE) IMPLANT
GOWN STRL REUS W/TWL LRG LVL3 (GOWN DISPOSABLE) ×3
GOWN STRL REUS W/TWL XL LVL3 (GOWN DISPOSABLE)
KIT BASIN OR (CUSTOM PROCEDURE TRAY) ×2 IMPLANT
KIT ROOM TURNOVER OR (KITS) ×2 IMPLANT
LOCATOR NERVE 3 VOLT (DISPOSABLE) ×2 IMPLANT
LOOP VESSEL MAXI BLUE (MISCELLANEOUS) ×2 IMPLANT
NEEDLE HYPO 25X1 1.5 SAFETY (NEEDLE) ×2 IMPLANT
NS IRRIG 1000ML POUR BTL (IV SOLUTION) ×2 IMPLANT
PACK SURGICAL SETUP 50X90 (CUSTOM PROCEDURE TRAY) ×2 IMPLANT
PAD ARMBOARD 7.5X6 YLW CONV (MISCELLANEOUS) ×8 IMPLANT
PENCIL BUTTON HOLSTER BLD 10FT (ELECTRODE) ×2 IMPLANT
STOCKINETTE 4X48 STRL (DRAPES) ×2 IMPLANT
SUT ETHILON 3 0 PS 1 (SUTURE) ×2 IMPLANT
SUT SILK 3 0 SH 30 (SUTURE) IMPLANT
SUT VIC AB 2-0 CT2 18 VCP726D (SUTURE) ×2 IMPLANT
SUT VIC AB 3-0 SH 8-18 (SUTURE) ×4 IMPLANT
SYR BULB 3OZ (MISCELLANEOUS) ×2 IMPLANT
SYR CONTROL 10ML LL (SYRINGE) ×2 IMPLANT
TOWEL OR 17X24 6PK STRL BLUE (TOWEL DISPOSABLE) ×2 IMPLANT
TOWEL OR 17X26 10 PK STRL BLUE (TOWEL DISPOSABLE) ×2 IMPLANT
TUBE CONNECTING 12X1/4 (SUCTIONS) ×2 IMPLANT
UNDERPAD 30X30 INCONTINENT (UNDERPADS AND DIAPERS) ×2 IMPLANT
WATER STERILE IRR 1000ML POUR (IV SOLUTION) ×2 IMPLANT

## 2014-06-23 NOTE — Anesthesia Postprocedure Evaluation (Signed)
  Anesthesia Post-op Note  Patient: Jason EddyGordon L Zhang Jr.  Procedure(s) Performed: Procedure(s): ULNAR NERVE DECOMPRESSION/TRANSPOSITION (Right)  Patient Location: PACU  Anesthesia Type:General  Level of Consciousness: awake and alert   Airway and Oxygen Therapy: Patient Spontanous Breathing  Post-op Pain: mild  Post-op Assessment: Post-op Vital signs reviewed  Post-op Vital Signs: stable  Last Vitals:  Filed Vitals:   06/23/14 1503  BP: 124/83  Pulse: 81  Temp: 36.1 C  Resp: 35    Complications: No apparent anesthesia complications

## 2014-06-23 NOTE — Op Note (Signed)
06/23/2014  2:12 PM  PATIENT:  Jason EddyGordon L Matsuura Jr.  58 y.o. male  PRE-OPERATIVE DIAGNOSIS:  right ulnar nerve lesion  POST-OPERATIVE DIAGNOSIS:  right ulnar nerve lesion  PROCEDURE:  Procedure(s):right ULNAR NERVE DECOMPRESSION  SURGEON:  Surgeon(s): Coletta MemosKyle Anselmo Reihl, MD  ASSISTANTS:none  ANESTHESIA:   general  EBL:  Total I/O In: 1200 [I.V.:1200] Out: -   BLOOD ADMINISTERED:none  CELL SAVER GIVEN:none  COUNT:per nursing  DRAINS: none   SPECIMEN:  No Specimen  DICTATION: Jason Patterson was taken to the operating room, intubated and placed under a general anesthetic without difficulty. His right upper extremity was prepped and draped in a sterile manner.I marked my incision over the ulnar groove, in a semicircular fashion. I infiltrated lidocaine into the planned  Incision. I opened the skin with a 10 blade and dissected sharply to the ulnar groove. I exposed the ulnar nerve and using the nerve stimulator confirmed the structure as the ulnar nerve. The nerve was located beneath some dense scar tissue, and was minimally responsive to the nerve stimulator. Nevertheless I opened the cubital tunnel, and dissected distally to the flexor carpi ulnaris,dividing some of the proximal fibers overlying the nerve. Proximally the nerve was also freed.  This was accomplished with sharp dissection using scissors. I irrigated the wound, then closed in two layers with vicryl sutures. I used dermabond for a sterile dression.   PLAN OF CARE: Admit for overnight observation  PATIENT DISPOSITION:  PACU - hemodynamically stable.   Delay start of Pharmacological VTE agent (>24hrs) due to surgical blood loss or risk of bleeding:  yes

## 2014-06-23 NOTE — Anesthesia Preprocedure Evaluation (Addendum)
Anesthesia Evaluation  Patient identified by MRN, date of birth, ID band Patient awake    Reviewed: Allergy & Precautions, H&P , NPO status , Patient's Chart, lab work & pertinent test results  History of Anesthesia Complications Negative for: history of anesthetic complications  Airway Mallampati: II TM Distance: >3 FB Neck ROM: Full    Dental  (+) Edentulous Upper, Edentulous Lower, Dental Advisory Given   Pulmonary shortness of breath, COPD COPD inhaler, Current Smoker,  breath sounds clear to auscultation+ rhonchi         Cardiovascular hypertension, Pt. on medications Rhythm:Regular Rate:Normal     Neuro/Psych  Headaches, Seizures -, Well Controlled,  PSYCHIATRIC DISORDERS Anxiety Depression Bipolar Disorder Schizophrenia    GI/Hepatic Neg liver ROS, GERD-  Medicated and Controlled,  Endo/Other    Renal/GU negative Renal ROS     Musculoskeletal   Abdominal   Peds  Hematology   Anesthesia Other Findings   Reproductive/Obstetrics negative OB ROS                         Anesthesia Physical Anesthesia Plan  ASA: III  Anesthesia Plan: General   Post-op Pain Management:    Induction: Intravenous  Airway Management Planned: LMA and Oral ETT  Additional Equipment:   Intra-op Plan:   Post-operative Plan: Extubation in OR  Informed Consent: I have reviewed the patients History and Physical, chart, labs and discussed the procedure including the risks, benefits and alternatives for the proposed anesthesia with the patient or authorized representative who has indicated his/her understanding and acceptance.     Plan Discussed with: CRNA and Surgeon  Anesthesia Plan Comments:         Anesthesia Quick Evaluation
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
## 2014-06-23 NOTE — Progress Notes (Signed)
Orthopedic Tech Progress Note Patient Details:  Jason EddyGordon L Mcelwain Jr. November 16, 1956 [id number] Delivered to PACU Ortho Devices Type of Ortho Device: Arm sling Ortho Device/Splint Interventions: Ordered   [place] R Thompson 06/23/2014, 3:37 PM

## 2014-06-23 NOTE — Discharge Instructions (Signed)
Call if temperature is greater 101.5 If wound becomes red, drains fluid/pus, very tender Keep arm elevated for ~3 days May take a shower tomorrow

## 2014-06-23 NOTE — Anesthesia Procedure Notes (Signed)
Procedure Name: Intubation Date/Time: 06/23/2014 1:19 PM Performed by: Lovie CholOCK, Lubna Stegeman K Pre-anesthesia Checklist: Patient identified, Emergency Drugs available, Suction available, Patient being monitored and Timeout performed Patient Re-evaluated:Patient Re-evaluated prior to inductionOxygen Delivery Method: Circle system utilized Preoxygenation: Pre-oxygenation with 100% oxygen Intubation Type: IV induction Ventilation: Mask ventilation without difficulty Laryngoscope Size: Miller and 2 Grade View: Grade I Tube type: Oral Tube size: 7.5 mm Number of attempts: 1 Airway Equipment and Method: Stylet Placement Confirmation: ETT inserted through vocal cords under direct vision,  positive ETCO2,  CO2 detector and breath sounds checked- equal and bilateral Secured at: 23 cm Tube secured with: Tape Dental Injury: Teeth and Oropharynx as per pre-operative assessment

## 2014-06-23 NOTE — Transfer of Care (Signed)
Immediate Anesthesia Transfer of Care Note  Patient: Jason EddyGordon L Neiswender Jr.  Procedure(s) Performed: Procedure(s): ULNAR NERVE DECOMPRESSION/TRANSPOSITION (Right)  Patient Location: PACU  Anesthesia Type:General  Level of Consciousness: awake, oriented and patient cooperative  Airway & Oxygen Therapy: Patient Spontanous Breathing and Patient connected to face mask oxygen  Post-op Assessment: Report given to PACU RN and Post -op Vital signs reviewed and stable  Post vital signs: Reviewed  Complications: No apparent anesthesia complications

## 2014-06-27 ENCOUNTER — Encounter (HOSPITAL_COMMUNITY): Payer: Self-pay | Admitting: Neurosurgery

## 2014-07-11 NOTE — Discharge Summary (Signed)
Physician Discharge Summary  Patient ID: Jason Patterson. MRN: [id number] DOB/AGE: [birth year] 58 y.o.  Admit date: 06/23/2014 Discharge date: 07/11/2014  Admission Diagnoses:right ulnar nerve lesion   Discharge Diagnoses:right ulnar nerve lesion    Discharged Condition: good  Hospital Course: [name] was admitted and taken to the operating room for a right ulnar nerve decompression. Post op he was feeling better. He had improved use of his right hand. The wound was clean, dry, and without signs of infection at discharge. He was tolerating a regular diet, and voiding without difficulty.   Treatments: surgery: right ulnar nerve decompression.  Discharge Exam: Blood pressure 124/83, pulse 81, temperature 97 F (36.1 C), temperature source Oral, resp. rate 35, height 5' 7.25" (1.708 m), weight 88.905 kg (196 lb), SpO2 96.00%. General appearance: alert, cooperative and appears stated age Neurologic: Mental status: Alert, oriented, thought content appropriate Cranial nerves: normal Motor: weakness in right intrinsic muscles.  Disposition: [facility] Not Defined right ulnar nerve lesion    Medication List         acetaminophen-codeine 300-30 MG per tablet  Commonly known as:  TYLENOL #3  Take 1 tablet by mouth every 6 (six) hours as needed for moderate pain.     ARIPiprazole 400 MG Susr  Commonly known as:  ABILIFY MAINTENA  Inject 400 mg into the muscle every 30 (thirty) days.     asenapine 5 MG Subl 24 hr tablet  Commonly known as:  SAPHRIS  Place 10 mg under the tongue every 12 (twelve) hours as needed (aggitation).     aspirin 81 MG EC tablet  Take 1 tablet (81 mg total) by mouth daily.     cetirizine 10 MG tablet  Commonly known as:  ZYRTEC  Take 1 tablet (10 mg total) by mouth every morning.     clonazePAM 1 MG tablet  Commonly known as:  KLONOPIN  Take 1 mg by mouth 2 (two) times daily. May take 1 tablet for increased aggitation  no sooner than 2 hours after scheduled dose     cloNIDine 0.1 MG tablet  Commonly known as:  CATAPRES  Take 1 tablet (0.1 mg total) by mouth 2 (two) times daily.     diclofenac sodium 1 % Gel  Commonly known as:  VOLTAREN  Apply 4 g topically every 6 (six) hours as needed (for pain).     ergocalciferol 50000 UNITS capsule  Commonly known as:  VITAMIN D2  Take 1 capsule (50,000 Units total) by mouth every Wednesday.     Fluticasone-Salmeterol 500-50 MCG/DOSE Aepb  Commonly known as:  ADVAIR  Inhale 1 puff into the lungs 2 (two) times daily.     gabapentin 300 MG capsule  Commonly known as:  NEURONTIN  Take 1 capsule (300 mg total) by mouth 2 (two) times daily.     HYDROcodone-acetaminophen 5-325 MG per tablet  Commonly known as:  NORCO  Take 1 tablet by mouth every 6 (six) hours as needed for moderate pain.     lactulose 10 GM/15ML solution  Commonly known as:  CHRONULAC  Take 10 g by mouth daily.     lithium carbonate 300 MG capsule  Take 2 capsules (600 mg total) by mouth 2 (two) times daily.     omeprazole 40 MG capsule  Commonly known as:  PRILOSEC  Take 1 capsule (40 mg total) by mouth daily.     ondansetron 4 MG tablet  Commonly known as:  ZOFRAN  Take  4 mg by mouth every 8 (eight) hours as needed for nausea.     PROAIR HFA 108 (90 BASE) MCG/ACT inhaler  Generic drug:  albuterol  Inhale 2 puffs into the lungs every 6 (six) hours as needed for wheezing or shortness of breath.     tamsulosin 0.4 MG Caps capsule  Commonly known as:  FLOMAX  Take 0.8 mg by mouth daily.     tiotropium 18 MCG inhalation capsule  Commonly known as:  SPIRIVA  Place 1 capsule (18 mcg total) into inhaler and inhale daily.     topiramate 50 MG tablet  Commonly known as:  TOPAMAX  Take 1 tablet (50 mg total) by mouth daily.     traMADol 50 MG tablet  Commonly known as:  ULTRAM  Take 1 tablet (50 mg total) by mouth 3 (three) times daily as needed for moderate pain.     traZODone  50 MG tablet  Commonly known as:  DESYREL  Take 1 tablet (50 mg total) by mouth at bedtime.     zolpidem 10 MG tablet  Commonly known as:  AMBIEN  Take 20 mg by mouth at bedtime as needed for sleep.           Follow-up Information   Follow up with Remonia Otte L, MD In 3 weeks. (call office to make an appointment)    Specialty:  Neurosurgery   Contact information:   [address] [place] [address] [phone]       Signed: Niyonna Betsill L 07/11/2014, 7:45 PM

## 2014-08-23 ENCOUNTER — Encounter (HOSPITAL_COMMUNITY): Payer: Self-pay | Admitting: Emergency Medicine

## 2014-08-23 ENCOUNTER — Emergency Department (HOSPITAL_COMMUNITY)
Admission: EM | Admit: 2014-08-23 | Discharge: 2014-08-24 | Disposition: A | Discharge status: Other Acute Inpatient Hospital | Payer: Medicaid Other | Attending: Emergency Medicine | Admitting: Emergency Medicine

## 2014-08-23 DIAGNOSIS — Z7982 Long term (current) use of aspirin: Secondary | ICD-10-CM | POA: Diagnosis not present

## 2014-08-23 DIAGNOSIS — Z872 Personal history of diseases of the skin and subcutaneous tissue: Secondary | ICD-10-CM | POA: Diagnosis not present

## 2014-08-23 DIAGNOSIS — R4689 Other symptoms and signs involving appearance and behavior: Secondary | ICD-10-CM

## 2014-08-23 DIAGNOSIS — J449 Chronic obstructive pulmonary disease, unspecified: Secondary | ICD-10-CM | POA: Insufficient documentation

## 2014-08-23 DIAGNOSIS — I10 Essential (primary) hypertension: Secondary | ICD-10-CM | POA: Insufficient documentation

## 2014-08-23 DIAGNOSIS — R4589 Other symptoms and signs involving emotional state: Secondary | ICD-10-CM

## 2014-08-23 DIAGNOSIS — R45851 Suicidal ideations: Secondary | ICD-10-CM | POA: Insufficient documentation

## 2014-08-23 DIAGNOSIS — Z880 Allergy status to penicillin: Secondary | ICD-10-CM | POA: Diagnosis not present

## 2014-08-23 DIAGNOSIS — K219 Gastro-esophageal reflux disease without esophagitis: Secondary | ICD-10-CM | POA: Insufficient documentation

## 2014-08-23 DIAGNOSIS — Z79899 Other long term (current) drug therapy: Secondary | ICD-10-CM | POA: Insufficient documentation

## 2014-08-23 DIAGNOSIS — G40909 Epilepsy, unspecified, not intractable, without status epilepticus: Secondary | ICD-10-CM | POA: Insufficient documentation

## 2014-08-23 DIAGNOSIS — Z720 Tobacco use: Secondary | ICD-10-CM | POA: Insufficient documentation

## 2014-08-23 DIAGNOSIS — Z046 Encounter for general psychiatric examination, requested by authority: Secondary | ICD-10-CM | POA: Diagnosis present

## 2014-08-23 LAB — COMPREHENSIVE METABOLIC PANEL
ALT: 12 U/L (ref 0–53)
AST: 13 U/L (ref 0–37)
Albumin: 3.7 g/dL (ref 3.5–5.2)
Alkaline Phosphatase: 105 U/L (ref 39–117)
Anion gap: 14 (ref 5–15)
BILIRUBIN TOTAL: 0.2 mg/dL — AB (ref 0.3–1.2)
BUN: 4 mg/dL — ABNORMAL LOW (ref 6–23)
CHLORIDE: 99 meq/L (ref 96–112)
CO2: 20 mEq/L (ref 19–32)
CREATININE: 1.02 mg/dL (ref 0.50–1.35)
Calcium: 9.2 mg/dL (ref 8.4–10.5)
GFR calc Af Amer: >90 mL/min (ref >90)
GFR calc non Af Amer: 79 mL/min — ABNORMAL LOW (ref >90)
Glucose, Bld: 96 mg/dL (ref 70–99)
Potassium: 4 mEq/L (ref 3.7–5.3)
SODIUM: 133 meq/L — AB (ref 137–147)
Total Protein: 6.6 g/dL (ref 6.0–8.3)

## 2014-08-23 LAB — RAPID URINE DRUG SCREEN, HOSP PERFORMED
Amphetamines: NOT DETECTED
BENZODIAZEPINES: NOT DETECTED
Barbiturates: NOT DETECTED
Cocaine: NOT DETECTED
OPIATES: NOT DETECTED
Tetrahydrocannabinol: NOT DETECTED

## 2014-08-23 LAB — CBC WITH DIFFERENTIAL/PLATELET
BASOS ABS: 0 10*3/uL (ref 0.0–0.1)
Basophils Relative: 1 % (ref 0–1)
Eosinophils Absolute: 0.2 10*3/uL (ref 0.0–0.7)
Eosinophils Relative: 3 % (ref 0–5)
HCT: 39.9 % (ref 39.0–52.0)
Hemoglobin: 13.4 g/dL (ref 13.0–17.0)
LYMPHS PCT: 34 % (ref 12–46)
Lymphs Abs: 2.8 10*3/uL (ref 0.7–4.0)
MCH: 31.8 pg (ref 26.0–34.0)
MCHC: 33.6 g/dL (ref 30.0–36.0)
MCV: 94.5 fL (ref 78.0–100.0)
Monocytes Absolute: 0.5 10*3/uL (ref 0.1–1.0)
Monocytes Relative: 6 % (ref 3–12)
NEUTROS ABS: 4.6 10*3/uL (ref 1.7–7.7)
Neutrophils Relative %: 56 % (ref 43–77)
PLATELETS: 252 10*3/uL (ref 150–400)
RBC: 4.22 MIL/uL (ref 4.22–5.81)
RDW: 15.1 % (ref 11.5–15.5)
WBC: 8.2 10*3/uL (ref 4.0–10.5)

## 2014-08-23 LAB — ETHANOL: Alcohol, Ethyl (B): 13 mg/dL — ABNORMAL HIGH (ref 0–11)

## 2014-08-23 MED ORDER — MOMETASONE FURO-FORMOTEROL FUM 200-5 MCG/ACT IN AERO
2.0000 | INHALATION_SPRAY | Freq: Two times a day (BID) | RESPIRATORY_TRACT | Status: DC
  Administered 2014-08-24 (×2): 2 via RESPIRATORY_TRACT
  Filled 2014-08-23: qty 8.8

## 2014-08-23 MED ORDER — LORATADINE 10 MG PO TABS
10.0000 mg | ORAL_TABLET | Freq: Every day | ORAL | Status: DC
  Administered 2014-08-24: 10 mg via ORAL
  Filled 2014-08-23: qty 1

## 2014-08-23 MED ORDER — PANTOPRAZOLE SODIUM 40 MG PO TBEC
40.0000 mg | DELAYED_RELEASE_TABLET | Freq: Every day | ORAL | Status: DC
  Administered 2014-08-24: 40 mg via ORAL
  Filled 2014-08-23: qty 1

## 2014-08-23 MED ORDER — TOPIRAMATE 25 MG PO TABS
50.0000 mg | ORAL_TABLET | Freq: Every day | ORAL | Status: DC
  Administered 2014-08-24: 50 mg via ORAL
  Filled 2014-08-23 (×4): qty 2

## 2014-08-23 MED ORDER — ONDANSETRON HCL 4 MG PO TABS: 4.0000 mg | ORAL_TABLET | Freq: Three times a day (TID) | ORAL | Status: DC | PRN

## 2014-08-23 MED ORDER — ASENAPINE MALEATE 5 MG SL SUBL
10.0000 mg | SUBLINGUAL_TABLET | Freq: Two times a day (BID) | SUBLINGUAL | Status: DC | PRN
  Filled 2014-08-23: qty 2

## 2014-08-23 MED ORDER — ARIPIPRAZOLE ER 400 MG IM SUSR: 400.0000 mg | INTRAMUSCULAR | Status: DC

## 2014-08-23 MED ORDER — TRAMADOL HCL 50 MG PO TABS: 50.0000 mg | ORAL_TABLET | Freq: Three times a day (TID) | ORAL | Status: DC | PRN

## 2014-08-23 MED ORDER — TIOTROPIUM BROMIDE MONOHYDRATE 18 MCG IN CAPS
18.0000 ug | ORAL_CAPSULE | Freq: Every day | RESPIRATORY_TRACT | Status: DC
  Administered 2014-08-24: 18 ug via RESPIRATORY_TRACT
  Filled 2014-08-23: qty 5

## 2014-08-23 MED ORDER — ASPIRIN 81 MG PO TBEC
81.0000 mg | DELAYED_RELEASE_TABLET | Freq: Every day | ORAL | Status: DC
  Administered 2014-08-24: 81 mg via ORAL
  Filled 2014-08-23 (×5): qty 1

## 2014-08-23 MED ORDER — TAMSULOSIN HCL 0.4 MG PO CAPS
0.8000 mg | ORAL_CAPSULE | Freq: Every day | ORAL | Status: DC
  Administered 2014-08-24: 0.8 mg via ORAL
  Filled 2014-08-23: qty 2

## 2014-08-23 MED ORDER — CLONIDINE HCL 0.1 MG PO TABS
0.1000 mg | ORAL_TABLET | Freq: Two times a day (BID) | ORAL | Status: DC
  Administered 2014-08-24: 0.1 mg via ORAL
  Filled 2014-08-23: qty 1

## 2014-08-23 MED ORDER — CLONAZEPAM 0.5 MG PO TABS
1.0000 mg | ORAL_TABLET | Freq: Two times a day (BID) | ORAL | Status: DC
  Administered 2014-08-24: 1 mg via ORAL
  Filled 2014-08-23 (×2): qty 2

## 2014-08-23 MED ORDER — ALBUTEROL SULFATE HFA 108 (90 BASE) MCG/ACT IN AERS: 2.0000 | INHALATION_SPRAY | Freq: Four times a day (QID) | RESPIRATORY_TRACT | Status: DC | PRN

## 2014-08-23 MED ORDER — TRAZODONE HCL 50 MG PO TABS: 50.0000 mg | ORAL_TABLET | Freq: Every day | ORAL | Status: DC

## 2014-08-23 MED ORDER — LITHIUM CARBONATE 300 MG PO CAPS
600.0000 mg | ORAL_CAPSULE | Freq: Two times a day (BID) | ORAL | Status: DC
  Administered 2014-08-24: 600 mg via ORAL
  Filled 2014-08-23 (×8): qty 2

## 2014-08-23 MED ORDER — ZOLPIDEM TARTRATE 5 MG PO TABS
20.0000 mg | ORAL_TABLET | Freq: Every evening | ORAL | Status: DC | PRN
  Administered 2014-08-24: 20 mg via ORAL
  Filled 2014-08-23: qty 4

## 2014-08-23 MED ORDER — GABAPENTIN 300 MG PO CAPS
300.0000 mg | ORAL_CAPSULE | Freq: Two times a day (BID) | ORAL | Status: DC
  Administered 2014-08-24: 300 mg via ORAL
  Filled 2014-08-23: qty 1

## 2014-08-23 NOTE — ED Notes (Signed)
[facility] to Jason Patterson at [facility] pt meets inpatient criteria for treatment. [facility], but will try to place elsewhere.

## 2014-08-23 NOTE — ED Notes (Signed)
I have been under a lot of stress, have contemplated hurting myself but do not have a plan. A lot of past memories have came up and they have been bothering me per pt.

## 2014-08-23 NOTE — ED Notes (Signed)
Pt receiving tts consult at this time.  
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
## 2014-08-23 NOTE — BH Assessment (Signed)
Relayed results of assessment to Donell SievertSpencer Simon, PA. Per Karleen HampshireSpencer, [place] pt meets inpt criteria. [facility]. TTS to seek placement.   Per EDP. Dr. Estell HarpinZammit pt is medically cleared and he is in agreement with plan to seek placement.   Informed RN of plan to seek placement.  Clista BernhardtNancy Trysten Bernard, [facility] Triage Specialist 08/23/2014 10:55 PM
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
## 2014-08-23 NOTE — BH Assessment (Signed)
Inpt recommended but there [facility]. Sent referral to the following facilities with beds available.   [facility]  Clista BernhardtNancy Yaxiel Minnie, [facility] Triage Specialist 08/23/2014 11:20 PM

## 2014-08-23 NOTE — BH Assessment (Signed)
Reviewed notes prior to assessment.   Requested pt be placed in a room with equipment for assessment as a hallway does not allow for privacy needed. Per Jonny RuizJohn pt will be ready to be assessed in about 5 minutes.   Assessment to begin shortly.   Clista BernhardtNancy Dainelle Hun, [facility] Triage Specialist 08/23/2014 10:08 PM

## 2014-08-24 ENCOUNTER — Emergency Department (HOSPITAL_COMMUNITY): Payer: Medicaid Other

## 2014-08-24 LAB — URINALYSIS, ROUTINE W REFLEX MICROSCOPIC
Bilirubin Urine: NEGATIVE
GLUCOSE, UA: NEGATIVE mg/dL
HGB URINE DIPSTICK: NEGATIVE
KETONES UR: NEGATIVE mg/dL
Leukocytes, UA: NEGATIVE
Nitrite: NEGATIVE
PROTEIN: NEGATIVE mg/dL
Specific Gravity, Urine: <1.005 — ABNORMAL LOW (ref 1.005–1.030)
Urobilinogen, UA: 0.2 mg/dL (ref 0.0–1.0)
pH: 6.5 (ref 5.0–8.0)

## 2014-08-24 NOTE — Progress Notes (Signed)
Pt has been assessed and meets criteria for inpatient treatment. Pt has been accepted at [facility]. Pt will sign himself in and a Voluntary Admission form has been faxed to [facility] for pt.'s signature.  ED RN, Kara Meadmma will fax back to [facility]. Accepting physician is Dr. Deeann DowseBen Palumbo, no room assignment made yet for they are awaiting discharging. [facility] will call with room assignment. Delorise ShinerGrace [phone] contact person at [facility].   Derrell Lollingoris Jalisa Sacco, MSW Clinical Social Worker [phone]
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
## 2014-08-24 NOTE — ED Notes (Signed)
[facility] from [facility] with behavioral  Health, pt has been accepted at [facility]. Paper work to be faxed here  for pt to sign. Bed will be available later this evening.

## 2014-08-24 NOTE — ED Notes (Signed)
Patient was taken to restroom to empty urinal at 11:10 AM. Patient was provided medication by RN and became very talkative. Patient requested to receive funds of  40.00 to purchase  A 20 ounce [facility] LPMountain Dew. NT gave him a coke and he seemed to be satisfied with the coke provided. Provided comfort measures, dimming lights and turning on television to a program that he seemed to enjoy.

## 2014-08-24 NOTE — ED Notes (Signed)
EKG, u/a, and chest xray faxed to [facility] per request of Harriett [facility]Nancy [facility]

## 2014-08-24 NOTE — ED Notes (Signed)
Pt was transported to [facility] via [facility], Corinna CapraRebecca M., gave report on pt to behavorial health, pt left with all personal belongings.

## 2014-08-24 NOTE — BH Assessment (Signed)
Per Lupita Leashonna at [facility] pt could be considered for placement in AM if we can send them chest x-ray, urinalysis, and  EKG. Fax number [phone]  Informed Silva BandyKristi RN, of the request.  Clista BernhardtNancy Jayvan Mcshan, [facility] Triage Specialist 08/24/2014 2:21 AM

## 2014-08-24 NOTE — BHH Counselor (Signed)
TC from NavarroGrace at [facility]. She sts they are ready to take pt but need pt's voluntary consent form. Writer called pt's RN Kara Meadmma who sts she faxed the signed consent form this am to [facility] but she will refax it now.  Evette Cristalaroline Paige Tamir Wallman, [place] Assessment Counselor
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
## 2014-08-24 NOTE — ED Notes (Addendum)
Ice pack applied to site by EDT

## 2014-08-24 NOTE — ED Notes (Signed)
Signed papers faxed to [facility]. Attention Delorise ShinerGrace.

## 2014-08-24 NOTE — ED Notes (Signed)
Report given to Emma RN.

## 2014-08-24 NOTE — ED Notes (Signed)
Dr Adriana Simasook informed of incident in shower , into room to assess pt. Ice pack in place. No new orders at this time

## 2014-08-24 NOTE — ED Notes (Signed)
Received call from behavioral health , [place] requesting voluntary paperwork , as did not receive fax earlier- Paperwork re faxed at this time. Attempted to call to confirm that facility received paperwork- but no answer- Transmission log at this ED indicates fax went through successfully

## 2014-08-24 NOTE — ED Notes (Signed)
Called [facility] for transportation about 10 minutes ago to be transported to [place].

## 2014-08-24 NOTE — ED Notes (Signed)
Pt up to bathroom and breakfast tray given.

## 2014-08-24 NOTE — ED Notes (Signed)
Pt calmer at this time.

## 2014-08-24 NOTE — ED Notes (Signed)
Change sitter 11:30 to 11:36 restroom break.enviroment check completed.

## 2015-02-02 ENCOUNTER — Encounter (HOSPITAL_COMMUNITY): Payer: Self-pay | Admitting: Emergency Medicine

## 2015-02-02 ENCOUNTER — Emergency Department (HOSPITAL_COMMUNITY): Payer: Medicaid Other

## 2015-02-02 ENCOUNTER — Inpatient Hospital Stay (HOSPITAL_COMMUNITY)
Admission: EM | Admit: 2015-02-02 | Discharge: 2015-02-06 | DRG: 871 | Disposition: A | Discharge status: Skilled Nursing Facility | Payer: Medicaid Other | Attending: Internal Medicine | Admitting: Internal Medicine

## 2015-02-02 DIAGNOSIS — E871 Hypo-osmolality and hyponatremia: Secondary | ICD-10-CM | POA: Diagnosis present

## 2015-02-02 DIAGNOSIS — Z79899 Other long term (current) drug therapy: Secondary | ICD-10-CM

## 2015-02-02 DIAGNOSIS — K219 Gastro-esophageal reflux disease without esophagitis: Secondary | ICD-10-CM | POA: Diagnosis present

## 2015-02-02 DIAGNOSIS — A419 Sepsis, unspecified organism: Secondary | ICD-10-CM

## 2015-02-02 DIAGNOSIS — F259 Schizoaffective disorder, unspecified: Secondary | ICD-10-CM | POA: Diagnosis present

## 2015-02-02 DIAGNOSIS — N390 Urinary tract infection, site not specified: Secondary | ICD-10-CM | POA: Diagnosis present

## 2015-02-02 DIAGNOSIS — Z7951 Long term (current) use of inhaled steroids: Secondary | ICD-10-CM

## 2015-02-02 DIAGNOSIS — F411 Generalized anxiety disorder: Secondary | ICD-10-CM | POA: Diagnosis present

## 2015-02-02 DIAGNOSIS — I10 Essential (primary) hypertension: Secondary | ICD-10-CM | POA: Diagnosis present

## 2015-02-02 DIAGNOSIS — R6510 Systemic inflammatory response syndrome (SIRS) of non-infectious origin without acute organ dysfunction: Secondary | ICD-10-CM | POA: Diagnosis present

## 2015-02-02 DIAGNOSIS — Z7982 Long term (current) use of aspirin: Secondary | ICD-10-CM

## 2015-02-02 DIAGNOSIS — E43 Unspecified severe protein-calorie malnutrition: Secondary | ICD-10-CM | POA: Insufficient documentation

## 2015-02-02 DIAGNOSIS — F329 Major depressive disorder, single episode, unspecified: Secondary | ICD-10-CM | POA: Diagnosis present

## 2015-02-02 DIAGNOSIS — J449 Chronic obstructive pulmonary disease, unspecified: Secondary | ICD-10-CM | POA: Diagnosis present

## 2015-02-02 DIAGNOSIS — Z8249 Family history of ischemic heart disease and other diseases of the circulatory system: Secondary | ICD-10-CM

## 2015-02-02 DIAGNOSIS — F319 Bipolar disorder, unspecified: Secondary | ICD-10-CM | POA: Diagnosis present

## 2015-02-02 DIAGNOSIS — R109 Unspecified abdominal pain: Secondary | ICD-10-CM

## 2015-02-02 DIAGNOSIS — R4182 Altered mental status, unspecified: Secondary | ICD-10-CM

## 2015-02-02 DIAGNOSIS — B9620 Unspecified Escherichia coli [E. coli] as the cause of diseases classified elsewhere: Secondary | ICD-10-CM | POA: Diagnosis present

## 2015-02-02 DIAGNOSIS — Z6825 Body mass index (BMI) 25.0-25.9, adult: Secondary | ICD-10-CM

## 2015-02-02 DIAGNOSIS — F17210 Nicotine dependence, cigarettes, uncomplicated: Secondary | ICD-10-CM | POA: Diagnosis present

## 2015-02-02 LAB — URINE MICROSCOPIC-ADD ON

## 2015-02-02 LAB — CBC WITH DIFFERENTIAL/PLATELET
Basophils Absolute: 0 10*3/uL (ref 0.0–0.1)
Basophils Relative: 0 % (ref 0–1)
EOS ABS: 0 10*3/uL (ref 0.0–0.7)
Eosinophils Relative: 0 % (ref 0–5)
HCT: 36.3 % — ABNORMAL LOW (ref 39.0–52.0)
Hemoglobin: 12.2 g/dL — ABNORMAL LOW (ref 13.0–17.0)
LYMPHS PCT: 10 % — AB (ref 12–46)
Lymphs Abs: 1.5 10*3/uL (ref 0.7–4.0)
MCH: 32.6 pg (ref 26.0–34.0)
MCHC: 33.6 g/dL (ref 30.0–36.0)
MCV: 97.1 fL (ref 78.0–100.0)
Monocytes Absolute: 1 10*3/uL (ref 0.1–1.0)
Monocytes Relative: 7 % (ref 3–12)
Neutro Abs: 12.6 10*3/uL — ABNORMAL HIGH (ref 1.7–7.7)
Neutrophils Relative %: 83 % — ABNORMAL HIGH (ref 43–77)
Platelets: 204 10*3/uL (ref 150–400)
RBC: 3.74 MIL/uL — ABNORMAL LOW (ref 4.22–5.81)
RDW: 14.3 % (ref 11.5–15.5)
WBC: 15.1 10*3/uL — AB (ref 4.0–10.5)

## 2015-02-02 LAB — URINALYSIS, ROUTINE W REFLEX MICROSCOPIC
Bilirubin Urine: NEGATIVE
GLUCOSE, UA: NEGATIVE mg/dL
Ketones, ur: NEGATIVE mg/dL
Nitrite: NEGATIVE
PH: 5.5 (ref 5.0–8.0)
Protein, ur: 30 mg/dL — AB
Specific Gravity, Urine: 1.01 (ref 1.005–1.030)
Urobilinogen, UA: 1 mg/dL (ref 0.0–1.0)

## 2015-02-02 LAB — BASIC METABOLIC PANEL
Anion gap: 9 (ref 5–15)
BUN: 15 mg/dL (ref 6–23)
CALCIUM: 8.8 mg/dL (ref 8.4–10.5)
CO2: 24 mmol/L (ref 19–32)
CREATININE: 1.14 mg/dL (ref 0.50–1.35)
Chloride: 100 mmol/L (ref 96–112)
GFR calc Af Amer: 80 mL/min — ABNORMAL LOW (ref >90)
GFR, EST NON AFRICAN AMERICAN: 69 mL/min — AB (ref >90)
Glucose, Bld: 95 mg/dL (ref 70–99)
Potassium: 3.6 mmol/L (ref 3.5–5.1)
Sodium: 133 mmol/L — ABNORMAL LOW (ref 135–145)

## 2015-02-02 LAB — LACTIC ACID, PLASMA: Lactic Acid, Venous: 1.6 mmol/L (ref 0.5–2.0)

## 2015-02-02 LAB — LITHIUM LEVEL: Lithium Lvl: 0.65 mmol/L — ABNORMAL LOW (ref 0.80–1.40)

## 2015-02-02 MED ORDER — CIPROFLOXACIN IN D5W 400 MG/200ML IV SOLN
400.0000 mg | Freq: Once | INTRAVENOUS | Status: AC
  Administered 2015-02-02: 400 mg via INTRAVENOUS
  Filled 2015-02-02: qty 200

## 2015-02-02 MED ORDER — SODIUM CHLORIDE 0.9 % IV BOLUS (SEPSIS)
1000.0000 mL | Freq: Once | INTRAVENOUS | Status: AC
  Administered 2015-02-02: 1000 mL via INTRAVENOUS

## 2015-02-02 NOTE — ED Notes (Signed)
EMS reports that Pt. Has state appointed power of attorney.
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
## 2015-02-02 NOTE — ED Notes (Signed)
Per EMS pt. From [facility]. Facility reports pt. C/o back pain. Reports pt. Altered mental status x2 days. Reports pt. Has been walking around the facility urinating on things. Pt. Alert and oriented x4.

## 2015-02-03 ENCOUNTER — Encounter (HOSPITAL_COMMUNITY): Payer: Self-pay | Admitting: Internal Medicine

## 2015-02-03 DIAGNOSIS — F319 Bipolar disorder, unspecified: Secondary | ICD-10-CM | POA: Diagnosis present

## 2015-02-03 DIAGNOSIS — N390 Urinary tract infection, site not specified: Secondary | ICD-10-CM | POA: Insufficient documentation

## 2015-02-03 DIAGNOSIS — K2970 Gastritis, unspecified, without bleeding: Secondary | ICD-10-CM | POA: Diagnosis not present

## 2015-02-03 DIAGNOSIS — I10 Essential (primary) hypertension: Secondary | ICD-10-CM | POA: Diagnosis present

## 2015-02-03 DIAGNOSIS — F411 Generalized anxiety disorder: Secondary | ICD-10-CM | POA: Diagnosis present

## 2015-02-03 DIAGNOSIS — F259 Schizoaffective disorder, unspecified: Secondary | ICD-10-CM | POA: Diagnosis present

## 2015-02-03 DIAGNOSIS — E871 Hypo-osmolality and hyponatremia: Secondary | ICD-10-CM | POA: Diagnosis present

## 2015-02-03 DIAGNOSIS — K209 Esophagitis, unspecified: Secondary | ICD-10-CM | POA: Diagnosis not present

## 2015-02-03 DIAGNOSIS — K920 Hematemesis: Secondary | ICD-10-CM | POA: Diagnosis not present

## 2015-02-03 DIAGNOSIS — A419 Sepsis, unspecified organism: Principal | ICD-10-CM

## 2015-02-03 DIAGNOSIS — B9620 Unspecified Escherichia coli [E. coli] as the cause of diseases classified elsewhere: Secondary | ICD-10-CM | POA: Diagnosis present

## 2015-02-03 DIAGNOSIS — R55 Syncope and collapse: Secondary | ICD-10-CM | POA: Diagnosis not present

## 2015-02-03 DIAGNOSIS — R4182 Altered mental status, unspecified: Secondary | ICD-10-CM

## 2015-02-03 DIAGNOSIS — F322 Major depressive disorder, single episode, severe without psychotic features: Secondary | ICD-10-CM

## 2015-02-03 DIAGNOSIS — Z7982 Long term (current) use of aspirin: Secondary | ICD-10-CM | POA: Diagnosis not present

## 2015-02-03 DIAGNOSIS — Z7951 Long term (current) use of inhaled steroids: Secondary | ICD-10-CM | POA: Diagnosis not present

## 2015-02-03 DIAGNOSIS — E43 Unspecified severe protein-calorie malnutrition: Secondary | ICD-10-CM

## 2015-02-03 DIAGNOSIS — Z6825 Body mass index (BMI) 25.0-25.9, adult: Secondary | ICD-10-CM | POA: Diagnosis not present

## 2015-02-03 DIAGNOSIS — F251 Schizoaffective disorder, depressive type: Secondary | ICD-10-CM

## 2015-02-03 DIAGNOSIS — Z79899 Other long term (current) drug therapy: Secondary | ICD-10-CM | POA: Diagnosis not present

## 2015-02-03 DIAGNOSIS — F17210 Nicotine dependence, cigarettes, uncomplicated: Secondary | ICD-10-CM | POA: Diagnosis present

## 2015-02-03 DIAGNOSIS — R6510 Systemic inflammatory response syndrome (SIRS) of non-infectious origin without acute organ dysfunction: Secondary | ICD-10-CM | POA: Diagnosis present

## 2015-02-03 DIAGNOSIS — K219 Gastro-esophageal reflux disease without esophagitis: Secondary | ICD-10-CM | POA: Diagnosis present

## 2015-02-03 DIAGNOSIS — J449 Chronic obstructive pulmonary disease, unspecified: Secondary | ICD-10-CM | POA: Diagnosis present

## 2015-02-03 DIAGNOSIS — R569 Unspecified convulsions: Secondary | ICD-10-CM | POA: Diagnosis not present

## 2015-02-03 DIAGNOSIS — Z8249 Family history of ischemic heart disease and other diseases of the circulatory system: Secondary | ICD-10-CM | POA: Diagnosis not present

## 2015-02-03 LAB — GLUCOSE, CAPILLARY: Glucose-Capillary: 86 mg/dL (ref 70–99)

## 2015-02-03 LAB — TSH: TSH: 0.748 u[IU]/mL (ref 0.350–4.500)

## 2015-02-03 MED ORDER — GABAPENTIN 300 MG PO CAPS
300.0000 mg | ORAL_CAPSULE | Freq: Two times a day (BID) | ORAL | Status: DC
  Administered 2015-02-03: 300 mg via ORAL
  Filled 2015-02-03: qty 1

## 2015-02-03 MED ORDER — TIOTROPIUM BROMIDE MONOHYDRATE 18 MCG IN CAPS
18.0000 ug | ORAL_CAPSULE | Freq: Every day | RESPIRATORY_TRACT | Status: DC
  Administered 2015-02-04 – 2015-02-06 (×3): 18 ug via RESPIRATORY_TRACT
  Filled 2015-02-03: qty 5

## 2015-02-03 MED ORDER — BUDESONIDE-FORMOTEROL FUMARATE 160-4.5 MCG/ACT IN AERO
2.0000 | INHALATION_SPRAY | Freq: Two times a day (BID) | RESPIRATORY_TRACT | Status: DC
  Administered 2015-02-03 – 2015-02-06 (×5): 2 via RESPIRATORY_TRACT
  Filled 2015-02-03: qty 6

## 2015-02-03 MED ORDER — ONDANSETRON HCL 4 MG/2ML IJ SOLN: 4.0000 mg | Freq: Four times a day (QID) | INTRAMUSCULAR | Status: DC | PRN

## 2015-02-03 MED ORDER — TAMSULOSIN HCL 0.4 MG PO CAPS
0.4000 mg | ORAL_CAPSULE | Freq: Every day | ORAL | Status: DC
  Administered 2015-02-04 – 2015-02-06 (×3): 0.4 mg via ORAL
  Filled 2015-02-03 (×3): qty 1

## 2015-02-03 MED ORDER — TOPIRAMATE 25 MG PO TABS
50.0000 mg | ORAL_TABLET | Freq: Every day | ORAL | Status: DC
  Administered 2015-02-03 – 2015-02-06 (×4): 50 mg via ORAL
  Filled 2015-02-03 (×6): qty 2

## 2015-02-03 MED ORDER — ENSURE COMPLETE PO LIQD
237.0000 mL | Freq: Two times a day (BID) | ORAL | Status: DC
  Administered 2015-02-03: 237 mL via ORAL

## 2015-02-03 MED ORDER — TAMSULOSIN HCL 0.4 MG PO CAPS
0.8000 mg | ORAL_CAPSULE | Freq: Every day | ORAL | Status: DC
  Administered 2015-02-03: 0.8 mg via ORAL
  Filled 2015-02-03: qty 2

## 2015-02-03 MED ORDER — ACETAMINOPHEN 325 MG PO TABS
650.0000 mg | ORAL_TABLET | Freq: Four times a day (QID) | ORAL | Status: DC | PRN
  Administered 2015-02-03 – 2015-02-06 (×3): 650 mg via ORAL
  Filled 2015-02-03 (×3): qty 2

## 2015-02-03 MED ORDER — HEPARIN SODIUM (PORCINE) 5000 UNIT/ML IJ SOLN
5000.0000 [IU] | Freq: Three times a day (TID) | INTRAMUSCULAR | Status: DC
  Administered 2015-02-03 – 2015-02-06 (×9): 5000 [IU] via SUBCUTANEOUS
  Filled 2015-02-03 (×10): qty 1

## 2015-02-03 MED ORDER — ONDANSETRON HCL 4 MG PO TABS: 4.0000 mg | ORAL_TABLET | Freq: Four times a day (QID) | ORAL | Status: DC | PRN

## 2015-02-03 MED ORDER — ALBUTEROL SULFATE (2.5 MG/3ML) 0.083% IN NEBU: 3.0000 mL | INHALATION_SOLUTION | Freq: Four times a day (QID) | RESPIRATORY_TRACT | Status: DC | PRN

## 2015-02-03 MED ORDER — HALOPERIDOL LACTATE 5 MG/ML IJ SOLN
5.0000 mg | Freq: Four times a day (QID) | INTRAMUSCULAR | Status: DC | PRN
  Filled 2015-02-03: qty 1

## 2015-02-03 MED ORDER — CLONAZEPAM 0.5 MG PO TABS
1.0000 mg | ORAL_TABLET | Freq: Two times a day (BID) | ORAL | Status: DC
  Administered 2015-02-03: 1 mg via ORAL
  Filled 2015-02-03: qty 2

## 2015-02-03 MED ORDER — ASPIRIN EC 81 MG PO TBEC
81.0000 mg | DELAYED_RELEASE_TABLET | Freq: Every day | ORAL | Status: DC
  Administered 2015-02-03 – 2015-02-06 (×4): 81 mg via ORAL
  Filled 2015-02-03 (×4): qty 1

## 2015-02-03 MED ORDER — CLONAZEPAM 0.5 MG PO TABS
2.0000 mg | ORAL_TABLET | Freq: Three times a day (TID) | ORAL | Status: DC | PRN
  Administered 2015-02-04 – 2015-02-06 (×8): 2 mg via ORAL
  Filled 2015-02-03 (×9): qty 4

## 2015-02-03 MED ORDER — CLONIDINE HCL 0.1 MG PO TABS
0.1000 mg | ORAL_TABLET | Freq: Two times a day (BID) | ORAL | Status: DC
  Administered 2015-02-03 – 2015-02-06 (×6): 0.1 mg via ORAL
  Filled 2015-02-03 (×6): qty 1

## 2015-02-03 MED ORDER — CLONAZEPAM 0.5 MG PO TABS
0.5000 mg | ORAL_TABLET | Freq: Two times a day (BID) | ORAL | Status: DC
  Administered 2015-02-03 – 2015-02-06 (×6): 0.5 mg via ORAL
  Filled 2015-02-03 (×6): qty 1

## 2015-02-03 MED ORDER — PANTOPRAZOLE SODIUM 40 MG PO TBEC
40.0000 mg | DELAYED_RELEASE_TABLET | Freq: Every day | ORAL | Status: DC
  Administered 2015-02-03 – 2015-02-06 (×4): 40 mg via ORAL
  Filled 2015-02-03 (×4): qty 1

## 2015-02-03 MED ORDER — LITHIUM CARBONATE ER 300 MG PO TBCR
600.0000 mg | EXTENDED_RELEASE_TABLET | Freq: Every day | ORAL | Status: DC
  Administered 2015-02-04: 600 mg via ORAL
  Filled 2015-02-03 (×2): qty 2

## 2015-02-03 MED ORDER — ACETAMINOPHEN 650 MG RE SUPP: 650.0000 mg | Freq: Four times a day (QID) | RECTAL | Status: DC | PRN

## 2015-02-03 MED ORDER — DEXTROSE-NACL 5-0.9 % IV SOLN
INTRAVENOUS | Status: DC
  Administered 2015-02-03: 02:00:00 via INTRAVENOUS

## 2015-02-03 MED ORDER — TIZANIDINE HCL 4 MG PO TABS
6.0000 mg | ORAL_TABLET | Freq: Three times a day (TID) | ORAL | Status: DC
  Administered 2015-02-03 – 2015-02-06 (×9): 6 mg via ORAL
  Filled 2015-02-03 (×9): qty 2

## 2015-02-03 MED ORDER — SODIUM CHLORIDE 0.9 % IV SOLN
Freq: Once | INTRAVENOUS | Status: AC
  Administered 2015-02-03: 02:00:00 via INTRAVENOUS

## 2015-02-03 MED ORDER — MOMETASONE FURO-FORMOTEROL FUM 200-5 MCG/ACT IN AERO
2.0000 | INHALATION_SPRAY | Freq: Two times a day (BID) | RESPIRATORY_TRACT | Status: DC
  Filled 2015-02-03 (×2): qty 8.8

## 2015-02-03 MED ORDER — CIPROFLOXACIN IN D5W 400 MG/200ML IV SOLN
400.0000 mg | Freq: Two times a day (BID) | INTRAVENOUS | Status: DC
  Administered 2015-02-03 – 2015-02-05 (×6): 400 mg via INTRAVENOUS
  Filled 2015-02-03 (×6): qty 200

## 2015-02-03 MED ORDER — ENSURE PUDDING PO PUDG
1.0000 | Freq: Three times a day (TID) | ORAL | Status: DC
  Administered 2015-02-03 – 2015-02-06 (×8): 1 via ORAL

## 2015-02-03 MED ORDER — SODIUM CHLORIDE 0.9 % IV SOLN
INTRAVENOUS | Status: DC
  Administered 2015-02-03 – 2015-02-06 (×6): via INTRAVENOUS

## 2015-02-03 MED ORDER — TEMAZEPAM 15 MG PO CAPS
30.0000 mg | ORAL_CAPSULE | Freq: Every day | ORAL | Status: DC
  Administered 2015-02-03 – 2015-02-05 (×3): 30 mg via ORAL
  Filled 2015-02-03 (×3): qty 2

## 2015-02-03 MED ORDER — LITHIUM CARBONATE ER 300 MG PO TBCR
600.0000 mg | EXTENDED_RELEASE_TABLET | Freq: Two times a day (BID) | ORAL | Status: DC
  Administered 2015-02-03: 600 mg via ORAL
  Filled 2015-02-03 (×7): qty 2

## 2015-02-03 NOTE — Progress Notes (Signed)
UR chart review completed.  

## 2015-02-03 NOTE — Care Management Note (Addendum)
    Page 1 of 1   02/06/2015     10:50:33 AM CARE MANAGEMENT NOTE 02/06/2015  Patient:  [name],Yojan L   Account Number:  [id number]  Date Initiated:  02/03/2015  Documentation initiated by:  Sharrie RothmanBLACKWELL,Tymesha Ditmore C  Subjective/Objective Assessment:   Pt admitted from [facility]. Anticipate pt will return to facility. Pt has guardian listed on chart.     Action/Plan:   CSW is aware and will arrange discharge to facility when medically stable.   Anticipated DC Date:  02/06/2015   Anticipated DC Plan:  ASSISTED LIVING / REST HOME  In-house referral  Clinical Social Worker      DC Planning Services  CM consult      Choice offered to / List presented to:             Status of service:  Completed, signed off Medicare Important Message given?   (If response is "NO", the following Medicare IM given date fields will be blank) Date Medicare IM given:   Medicare IM given by:   Date Additional Medicare IM given:   Additional Medicare IM given by:    Discharge Disposition:  ASSISTED LIVING  Per UR Regulation:    If discussed at Long Length of Stay Meetings, dates discussed:    Comments:  02/06/15 1040 Arlyss Queenammy Aijalon Demuro, RN BSN CM Pt discharged back to [facility]. CSW to arrange discharge discharge to facility.  02/03/15 1230 Arlyss Queenammy Sajid Ruppert, RN BSN CM

## 2015-02-03 NOTE — Progress Notes (Signed)
Notified by Toney ReilNathan, Dietician that patient stated if he had a gun he would kill himself right now. Spoke with patient at the bedside and patient stated to me that ihe wanted to kill himself if he had a gun at his side right now. MD was notified. Sucide sitter was initiated at the bedside with patient. Room was cleared of all harmful objects. Security was notified. Patient was informed of situation. Will continue to monitor patient at this time.

## 2015-02-03 NOTE — Progress Notes (Signed)
Notified by the patient advocates that were making rounds this AM that patient was threatening to throw a chair out the window if he did not receive his medications. Patient threatened multiple times. MD was notified. ChiropodistAssistant director and security spoke with patient at the bedside. Will continue to monitor patient at this time.
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
## 2015-02-03 NOTE — Clinical Social Work Placement (Signed)
Clinical Social Work Department CLINICAL SOCIAL WORK PLACEMENT NOTE 02/03/2015  Patient:  [name],Chigozie L  Account Number:  [id number] Admit date:  02/02/2015  Clinical Social Worker:  Derenda FennelKARA Aima Mcwhirt, LCSW  Date/time:  02/03/2015 10:17 AM  Clinical Social Work is seeking post-discharge placement for this patient at the following level of care:   SKILLED NURSING   (*CSW will update this form in Epic as items are completed)   02/03/2015  Patient/family provided with [facility] Department of Clinical Social Work's list of facilities offering this level of care within the geographic area requested by the patient (or if unable, by the patient's family).  02/03/2015  Patient/family informed of their freedom to choose among providers that offer the needed level of care, that participate in Medicare, Medicaid or managed care program needed by the patient, have an available bed and are willing to accept the patient.  02/03/2015  Patient/family informed of [facility]' ownership interest in [facility], as well as of the fact that they are under no obligation to receive care at this facility.  PASARR submitted to EDS on  PASARR number received on   FL2 transmitted to all facilities in geographic area requested by pt/family on  02/03/2015 FL2 transmitted to all facilities within larger geographic area on   Patient informed that his/her managed care company has contracts with or will negotiate with  certain facilities, including the following:     Patient/family informed of bed offers received:   Patient chooses bed at  Physician recommends and patient chooses bed at    Patient to be transferred to  on   Patient to be transferred to facility by  Patient and family notified of transfer on  Name of family member notified:    The following physician request were entered in Epic:   Additional Comments: Pt has existing pasarr.  Derenda FennelKara Kenlei Safi, [place]CS[place] [phone]

## 2015-02-03 NOTE — Clinical Social Work Note (Signed)
Pt's guardian Elonda HuskyCassandra called to check on status. Notified her that there are no bed offers yet. She is also aware of pt's suicidal comments. [name] states that pt has made similar comments often in the past.   Derenda FennelKara Harrie Cazarez, LCSW [phone]

## 2015-02-03 NOTE — Progress Notes (Signed)
[facility] PROGRESS NOTE  Jason Patterson. ZOX:[id number] DOB: October 11, 1956 DOA: 02/02/2015 PCP: Colon Branch, MD  Assessment/Plan: 1. UTI with sepsis 1. Afebrile 2. Level of alertness is improved 3. Pt is continued on ciprofloxacin, pending urine culture results 2. Schizoaffective disorder, bipolar, depression 1. Very tearful and admits to being depressed 2. Earlier in the day, patient made a remark about shooting himself to staff 3. I addressed this with the patient. Pt now denies suicidal or homicidal ideation and states joking about shooting himself 4. Continue home psych meds 5. Lithium level was low. Cont lithium as tolerated 3. HTN 1. BP stable, now normal 2. BP was lower overnight, likely from sepsis 4. Altered mental status 1. Improved level of alertness 5. COPD 1. No wheezing 2. Stable 6. Seizure disorder 1. On topiramate 2. Seizure free thus far 7. DVT prophylaxis 1. Heparin subQ  Code Status: Full Family Communication: Pt in room Disposition Plan: Pending   Consultants:    Procedures:    Antibiotics:  Ciprofloxacin 3/18>>>   HPI/Subjective: Wants a regular diet. Admits to feeling depression.  Objective: Filed Vitals:   02/03/15 0657 02/03/15 0705 02/03/15 0816 02/03/15 1507  BP: 115/70  Pulse: 72   83  Temp: 98 F (36.7 C)   98.4 F (36.9 C)  TempSrc: Oral   Oral  Resp: 20   20  Height:      Weight:      SpO2: 92%   92%    Intake/Output Summary (Last 24 hours) at 02/03/15 1715 Last data filed at 02/03/15 1652  Gross per 24 hour  Intake 3937.7 ml  Output   4100 ml  Net -162.3 ml   Filed Weights   02/02/15 2023 02/03/15 0122  Weight: 73.483 kg (162 lb) 75.66 kg (166 lb 12.8 oz)    Exam:   General:  Awake, in nad  Cardiovascular: regular, s1, s2  Respiratory: normal resp effort, no wheezing  Abdomen: soft,nondistended  Musculoskeletal: perfused, no clubbing   Data Reviewed: Basic  Metabolic Panel:  Recent Labs Lab 02/02/15 2103  NA 133*  Patterson 3.6  CL 100  CO2 24  GLUCOSE 95  BUN 15  CREATININE 1.14  CALCIUM 8.8   Liver Function Tests: No results for input(s): AST, ALT, ALKPHOS, BILITOT, PROT, ALBUMIN in the last 168 hours. No results for input(s): LIPASE, AMYLASE in the last 168 hours. No results for input(s): AMMONIA in the last 168 hours. CBC:  Recent Labs Lab 02/02/15 2103  WBC 15.1*  NEUTROABS 12.6*  HGB 12.2*  HCT 36.3*  MCV 97.1  PLT 204   Cardiac Enzymes: No results for input(s): CKTOTAL, CKMB, CKMBINDEX, TROPONINI in the last 168 hours. BNP (last 3 results) No results for input(s): BNP in the last 8760 hours.  ProBNP (last 3 results) No results for input(s): PROBNP in the last 8760 hours.  CBG:  Recent Labs Lab 02/03/15 0815  GLUCAP 86    No results found for this or any previous visit (from the past 240 hour(s)).   Studies: No results found.  Scheduled Meds: . aspirin EC  81 mg Oral Daily  . budesonide-formoterol  2 puff Inhalation BID  . ciprofloxacin  400 mg Intravenous Q12H  . clonazePAM  0.5 mg Oral BID  . cloNIDine  0.1 mg Oral BID  . feeding supplement (ENSURE)  1 Container Oral TID BM  . heparin  5,000 Units Subcutaneous 3 times per day  . [START ON 02/04/2015] lithium  carbonate  600 mg Oral QHS  . pantoprazole  40 mg Oral Daily  . [START ON 02/04/2015] tamsulosin  0.4 mg Oral Daily  . temazepam  30 mg Oral QHS  . tiotropium  18 mcg Inhalation Daily  . tiZANidine  6 mg Oral TID  . topiramate  50 mg Oral Daily   Continuous Infusions: . sodium chloride 100 mL/hr at 02/03/15 1018    Principal Problem:   Sepsis due to urinary tract infection Active Problems:   Bipolar disorder   Generalized anxiety disorder   Essential hypertension, benign   Major depression, chronic   Schizoaffective disorder   Altered mental status   Sepsis   Protein-calorie malnutrition, severe   Jason Patterson  [facility] Pager [phone]. If 7PM-7AM, please contact night-coverage at [email or web address], password [email or web address] 02/03/2015, 5:15 PM  LOS: 0 days
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
## 2015-02-04 DIAGNOSIS — R410 Disorientation, unspecified: Secondary | ICD-10-CM

## 2015-02-04 LAB — BASIC METABOLIC PANEL
Anion gap: 5 (ref 5–15)
BUN: 9 mg/dL (ref 6–23)
CALCIUM: 8.6 mg/dL (ref 8.4–10.5)
CO2: 23 mmol/L (ref 19–32)
Chloride: 114 mmol/L — ABNORMAL HIGH (ref 96–112)
Creatinine, Ser: 1.02 mg/dL (ref 0.50–1.35)
GFR calc Af Amer: >90 mL/min (ref >90)
GFR calc non Af Amer: 79 mL/min — ABNORMAL LOW (ref >90)
GLUCOSE: 130 mg/dL — AB (ref 70–99)
Potassium: 3.4 mmol/L — ABNORMAL LOW (ref 3.5–5.1)
Sodium: 142 mmol/L (ref 135–145)

## 2015-02-04 LAB — CBC
HEMATOCRIT: 31.7 % — AB (ref 39.0–52.0)
HEMOGLOBIN: 10.4 g/dL — AB (ref 13.0–17.0)
MCH: 32.4 pg (ref 26.0–34.0)
MCHC: 32.8 g/dL (ref 30.0–36.0)
MCV: 98.8 fL (ref 78.0–100.0)
Platelets: 146 10*3/uL — ABNORMAL LOW (ref 150–400)
RBC: 3.21 MIL/uL — ABNORMAL LOW (ref 4.22–5.81)
RDW: 14.6 % (ref 11.5–15.5)
WBC: 6.3 10*3/uL (ref 4.0–10.5)

## 2015-02-04 MED ORDER — POTASSIUM CHLORIDE CRYS ER 20 MEQ PO TBCR
40.0000 meq | EXTENDED_RELEASE_TABLET | Freq: Once | ORAL | Status: AC
  Administered 2015-02-04: 40 meq via ORAL
  Filled 2015-02-04: qty 2

## 2015-02-04 NOTE — Progress Notes (Signed)
[facility] PROGRESS NOTE  Jason Patterson. ZOX:[id number] DOB: 16-Dec-1955 DOA: 02/02/2015 PCP: Colon Branch, MD  Assessment/Plan: 1. UTI with sepsis 1. Afebrile 2. Level of alertness is improved, possibly at baseline 3. Pt is continued on ciprofloxacin 4. Thus far urine cx pos for >100,000 E.coli 2. Schizoaffective disorder, bipolar, depression 1. Has admitted to being very depressed 2. Denies suicidal ideation. Pt declines telepsych consult 3. Continue home psych meds 4. Lithium level was low. Cont lithium as tolerated 5. Discussed with POA who states patient's affect is very labile at baseline 3. HTN 1. BP stable, controlled 4. Altered mental status 1. Improved level of alertness 2. Seems to be at baseline 5. COPD 1. No wheezing 2. Stable 6. Seizure disorder 1. On topiramate 2. Seizure free thus far 7. DVT prophylaxis 1. Heparin subQ  Code Status: Full Family Communication: Pt in room Disposition Plan: Pending placement   Consultants:    Procedures:    Antibiotics:  Ciprofloxacin 3/18>>>   HPI/Subjective: Intermittently agitated. Otherwise no acute events noted  Objective: Filed Vitals:   02/03/15 2256 02/04/15 0433 02/04/15 0753 02/04/15 1551  BP: 97/62 126/78  112/72  Pulse: 55 47  83  Temp: 98.3 F (36.8 C) 97.5 F (36.4 C)  97.5 F (36.4 C)  TempSrc: Oral Oral  Oral  Resp: Height:      Weight:      SpO2: 100% 100% 99% 99%    Intake/Output Summary (Last 24 hours) at 02/04/15 1623 Last data filed at 02/04/15 1500  Gross per 24 hour  Intake   1780 ml  Output   5550 ml  Net  -3770 ml   Filed Weights   02/02/15 2023 02/03/15 0122  Weight: 73.483 kg (162 lb) 75.66 kg (166 lb 12.8 oz)    Exam:   General:  Awake, in nad  Cardiovascular: regular, s1, s2  Respiratory: normal resp effort, no wheezing  Abdomen: soft,nondistended  Musculoskeletal: perfused, no clubbing   Data Reviewed: Basic Metabolic  Panel:  Recent Labs Lab 02/02/15 2103 02/04/15 0634  NA 133* 142  K 3.6 3.4*  CL 100 114*  CO2 24 23  GLUCOSE 95 130*  BUN 15 9  CREATININE 1.14 1.02  CALCIUM 8.8 8.6   Liver Function Tests: No results for input(s): AST, ALT, ALKPHOS, BILITOT, PROT, ALBUMIN in the last 168 hours. No results for input(s): LIPASE, AMYLASE in the last 168 hours. No results for input(s): AMMONIA in the last 168 hours. CBC:  Recent Labs Lab 02/02/15 2103 02/04/15 0634  WBC 15.1* 6.3  NEUTROABS 12.6*  --   HGB 12.2* 10.4*  HCT 36.3* 31.7*  MCV 97.1 98.8  PLT 204 146*   Cardiac Enzymes: No results for input(s): CKTOTAL, CKMB, CKMBINDEX, TROPONINI in the last 168 hours. BNP (last 3 results) No results for input(s): BNP in the last 8760 hours.  ProBNP (last 3 results) No results for input(s): PROBNP in the last 8760 hours.  CBG:  Recent Labs Lab 02/03/15 0815  GLUCAP 86    Recent Results (from the past 240 hour(s))  Urine culture     Status: None (Preliminary result)   Collection Time: 02/02/15  9:25 PM  Result Value Ref Range Status   Specimen Description URINE, CLEAN CATCH  Final   Special Requests NONE  Final   Colony Count   Final    >=100,000 COLONIES/ML Performed at [facility]   Final  ESCHERICHIA COLI Performed at [facility]    Report Status PENDING  Incomplete     Studies: No results found.  Scheduled Meds: . aspirin EC  81 mg Oral Daily  . budesonide-formoterol  2 puff Inhalation BID  . ciprofloxacin  400 mg Intravenous Q12H  . clonazePAM  0.5 mg Oral BID  . cloNIDine  0.1 mg Oral BID  . feeding supplement (ENSURE)  1 Container Oral TID BM  . heparin  5,000 Units Subcutaneous 3 times per day  . lithium carbonate  600 mg Oral QHS  . pantoprazole  40 mg Oral Daily  . tamsulosin  0.4 mg Oral Daily  . temazepam  30 mg Oral QHS  . tiotropium  18 mcg Inhalation Daily  . tiZANidine  6 mg Oral TID  . topiramate  50 mg Oral  Daily   Continuous Infusions: . sodium chloride 100 mL/hr at 02/04/15 [id number]    Principal Problem:   Sepsis due to urinary tract infection Active Problems:   Bipolar disorder   Generalized anxiety disorder   Essential hypertension, benign   Major depression, chronic   Schizoaffective disorder   Altered mental status   Sepsis   Protein-calorie malnutrition, severe   UTI (lower urinary tract infection)   CHIU, STEPHEN K  [facility] Pager [phone]. If 7PM-7AM, please contact night-coverage at [email or web address], password [email or web address] 02/04/2015, 4:23 PM  LOS: 1 day

## 2015-02-05 ENCOUNTER — Inpatient Hospital Stay (HOSPITAL_COMMUNITY): Payer: Medicaid Other

## 2015-02-05 DIAGNOSIS — F250 Schizoaffective disorder, bipolar type: Secondary | ICD-10-CM

## 2015-02-05 DIAGNOSIS — K5909 Other constipation: Secondary | ICD-10-CM

## 2015-02-05 LAB — CBC
HCT: 36.3 % — ABNORMAL LOW (ref 39.0–52.0)
Hemoglobin: 12.2 g/dL — ABNORMAL LOW (ref 13.0–17.0)
MCH: 32.4 pg (ref 26.0–34.0)
MCHC: 33.6 g/dL (ref 30.0–36.0)
MCV: 96.3 fL (ref 78.0–100.0)
Platelets: 193 10*3/uL (ref 150–400)
RBC: 3.77 MIL/uL — AB (ref 4.22–5.81)
RDW: 14.4 % (ref 11.5–15.5)
WBC: 6 10*3/uL (ref 4.0–10.5)

## 2015-02-05 LAB — URINE CULTURE: Colony Count: >=100000

## 2015-02-05 LAB — BASIC METABOLIC PANEL
Anion gap: 9 (ref 5–15)
BUN: 6 mg/dL (ref 6–23)
CO2: 18 mmol/L — AB (ref 19–32)
Calcium: 8.7 mg/dL (ref 8.4–10.5)
Chloride: 113 mmol/L — ABNORMAL HIGH (ref 96–112)
Creatinine, Ser: 0.85 mg/dL (ref 0.50–1.35)
GFR calc Af Amer: >90 mL/min (ref >90)
GFR calc non Af Amer: >90 mL/min (ref >90)
GLUCOSE: 105 mg/dL — AB (ref 70–99)
Potassium: 3.4 mmol/L — ABNORMAL LOW (ref 3.5–5.1)
Sodium: 140 mmol/L (ref 135–145)

## 2015-02-05 LAB — LITHIUM LEVEL: Lithium Lvl: 0.34 mmol/L — ABNORMAL LOW (ref 0.80–1.40)

## 2015-02-05 LAB — MAGNESIUM: MAGNESIUM: 1.9 mg/dL (ref 1.5–2.5)

## 2015-02-05 MED ORDER — POTASSIUM CHLORIDE CRYS ER 20 MEQ PO TBCR
40.0000 meq | EXTENDED_RELEASE_TABLET | Freq: Once | ORAL | Status: AC
  Administered 2015-02-05: 40 meq via ORAL
  Filled 2015-02-05: qty 2

## 2015-02-05 MED ORDER — BISACODYL 10 MG RE SUPP
10.0000 mg | Freq: Once | RECTAL | Status: AC
  Administered 2015-02-05: 10 mg via RECTAL
  Filled 2015-02-05: qty 1

## 2015-02-05 MED ORDER — KETOROLAC TROMETHAMINE 15 MG/ML IJ SOLN
15.0000 mg | Freq: Four times a day (QID) | INTRAMUSCULAR | Status: DC | PRN
  Administered 2015-02-05 – 2015-02-06 (×3): 15 mg via INTRAVENOUS
  Filled 2015-02-05 (×3): qty 1

## 2015-02-05 MED ORDER — LUBIPROSTONE 24 MCG PO CAPS
24.0000 ug | ORAL_CAPSULE | Freq: Two times a day (BID) | ORAL | Status: DC
  Administered 2015-02-05 – 2015-02-06 (×2): 24 ug via ORAL
  Filled 2015-02-05 (×7): qty 1

## 2015-02-05 MED ORDER — MAGNESIUM CITRATE PO SOLN
1.0000 | Freq: Once | ORAL | Status: AC
  Administered 2015-02-05: 1 via ORAL
  Filled 2015-02-05: qty 296

## 2015-02-05 MED ORDER — LITHIUM CARBONATE ER 300 MG PO TBCR
1200.0000 mg | EXTENDED_RELEASE_TABLET | Freq: Every day | ORAL | Status: DC
Start: 2015-02-05 — End: 2015-02-06
  Administered 2015-02-05: 1200 mg via ORAL
  Filled 2015-02-05 (×3): qty 4

## 2015-02-05 NOTE — Progress Notes (Signed)
[facility] PROGRESS NOTE  Jason EddyGordon L Kwasnik Jr. [id number] DOB: 06-28-1956 DOA: 02/02/2015 PCP: Colon BranchQURESHI, AYYAZ, MD  Assessment/Plan: 1. UTI with sepsis 1. Remains afebrile 2. Level of alertness is improved, possibly back at baseline 3. Pt is continued on ciprofloxacin 4. Thus far urine cx pos for >100,000 E.coli, pending sensitivities 2. Schizoaffective disorder, bipolar, depression 1. Has admitted to being very depressed 2. Denies suicidal ideation. Pt declined telepsych consult 3. Continued home psych meds 4. Lithium level was low on presentation. Cont lithium as tolerated. Follow lithium levels 5. Discussed with POA who states patient's affect is very labile at baseline. Seems to be in good spirits currently 3. HTN 1. BP remains stable, controlled 4. Altered mental status 1. Improved level of alertness seems to be at baseline 5. COPD 1. No wheezing 2. Stable 6. Seizure disorder 1. On topiramate 2. Seizure free thus far 7. DVT prophylaxis 1. Heparin subQ 8. L flank pain 1. Will order abd xray 2. On chart review, pt had older abd xray from 2011 obtained also for L flank pain at that time. On that imaging, pt was noted to have marked colonic stool at the location of the kidneys 3. Will give trial of cathartics  Code Status: Full Family Communication: Pt in room, had discussed care with POA, [name] Disposition Plan: Pending placement   Consultants:    Procedures:    Antibiotics:  Ciprofloxacin 3/18>>>   HPI/Subjective: Complains of L flank pain  Objective: Filed Vitals:   02/04/15 1933 02/04/15 2148 02/05/15 0500 02/05/15 0729  BP:  116/60 127/64   Pulse:  81 66   Temp:  97.5 F (36.4 C) 98.2 F (36.8 C)   TempSrc:  Oral Oral   Resp:  18 20   Height:      Weight:      SpO2: 99% 100% 99% 96%    Intake/Output Summary (Last 24 hours) at 02/05/15 1024 Last data filed at 02/05/15 0600  Gross per 24 hour  Intake   6610 ml  Output   5200  ml  Net   1410 ml   Filed Weights   02/02/15 2023 02/03/15 0122  Weight: 73.483 kg (162 lb) 75.66 kg (166 lb 12.8 oz)    Exam:   General:  Awake, in nad  Cardiovascular: regular, s1, s2  Respiratory: normal resp effort, no wheezing  Abdomen: soft, decreased bowel sounds  Musculoskeletal: perfused, no clubbing   Data Reviewed: Basic Metabolic Panel:  Recent Labs Lab 02/02/15 2103 02/04/15 0634 02/05/15 0636  NA 133* 142 140  K 3.6 3.4* 3.4*  CL 100 114* 113*  CO2 24 23 18*  GLUCOSE 95 130* 105*  BUN 15 9 6   CREATININE 1.14 1.02 0.85  CALCIUM 8.8 8.6 8.7  MG  --   --  1.9   Liver Function Tests: No results for input(s): AST, ALT, ALKPHOS, BILITOT, PROT, ALBUMIN in the last 168 hours. No results for input(s): LIPASE, AMYLASE in the last 168 hours. No results for input(s): AMMONIA in the last 168 hours. CBC:  Recent Labs Lab 02/02/15 2103 02/04/15 0634 02/05/15 0636  WBC 15.1* 6.3 6.0  NEUTROABS 12.6*  --   --   HGB 12.2* 10.4* 12.2*  HCT 36.3* 31.7* 36.3*  MCV 97.1 98.8 96.3  PLT 204 146* 193   Cardiac Enzymes: No results for input(s): CKTOTAL, CKMB, CKMBINDEX, TROPONINI in the last 168 hours. BNP (last 3 results) No results for input(s): BNP in the last 8760 hours.  ProBNP (last 3 results) No results for input(s): PROBNP in the last 8760 hours.  CBG:  Recent Labs Lab 02/03/15 0815  GLUCAP 86    Recent Results (from the past 240 hour(s))  Urine culture     Status: None (Preliminary result)   Collection Time: 02/02/15  9:25 PM  Result Value Ref Range Status   Specimen Description URINE, CLEAN CATCH  Final   Special Requests NONE  Final   Colony Count   Final    >=100,000 COLONIES/ML Performed at [facility]    Culture   Final    ESCHERICHIA COLI Performed at [facility]    Report Status PENDING  Incomplete     Studies: No results found.  Scheduled Meds: . aspirin EC  81 mg Oral Daily  . bisacodyl  10 mg  Rectal Once  . budesonide-formoterol  2 puff Inhalation BID  . ciprofloxacin  400 mg Intravenous Q12H  . clonazePAM  0.5 mg Oral BID  . cloNIDine  0.1 mg Oral BID  . feeding supplement (ENSURE)  1 Container Oral TID BM  . heparin  5,000 Units Subcutaneous 3 times per day  . lithium carbonate  600 mg Oral QHS  . lubiprostone  24 mcg Oral BID WC  . magnesium citrate  1 Bottle Oral Once  . pantoprazole  40 mg Oral Daily  . tamsulosin  0.4 mg Oral Daily  . temazepam  30 mg Oral QHS  . tiotropium  18 mcg Inhalation Daily  . tiZANidine  6 mg Oral TID  . topiramate  50 mg Oral Daily   Continuous Infusions: . sodium chloride 100 mL/hr at 02/05/15 0807    Principal Problem:   Sepsis due to urinary tract infection Active Problems:   Bipolar disorder   Generalized anxiety disorder   Essential hypertension, benign   Major depression, chronic   Schizoaffective disorder   Altered mental status   Sepsis   Protein-calorie malnutrition, severe   UTI (lower urinary tract infection)   CHIU, STEPHEN K  [facility] Pager [phone]. If 7PM-7AM, please contact night-coverage at [email or web address], password [facility] 02/05/2015, 10:24 AM  LOS: 2 days

## 2015-02-06 DIAGNOSIS — F3110 Bipolar disorder, current episode manic without psychotic features, unspecified: Secondary | ICD-10-CM

## 2015-02-06 LAB — BASIC METABOLIC PANEL
Anion gap: 6 (ref 5–15)
BUN: 10 mg/dL (ref 6–23)
CO2: 20 mmol/L (ref 19–32)
CREATININE: 0.93 mg/dL (ref 0.50–1.35)
Calcium: 8.3 mg/dL — ABNORMAL LOW (ref 8.4–10.5)
Chloride: 113 mmol/L — ABNORMAL HIGH (ref 96–112)
GFR calc non Af Amer: >90 mL/min (ref >90)
GLUCOSE: 137 mg/dL — AB (ref 70–99)
POTASSIUM: 3.4 mmol/L — AB (ref 3.5–5.1)
Sodium: 139 mmol/L (ref 135–145)

## 2015-02-06 LAB — CBC
HCT: 31.6 % — ABNORMAL LOW (ref 39.0–52.0)
Hemoglobin: 10.4 g/dL — ABNORMAL LOW (ref 13.0–17.0)
MCH: 31.7 pg (ref 26.0–34.0)
MCHC: 32.9 g/dL (ref 30.0–36.0)
MCV: 96.3 fL (ref 78.0–100.0)
PLATELETS: 148 10*3/uL — AB (ref 150–400)
RBC: 3.28 MIL/uL — ABNORMAL LOW (ref 4.22–5.81)
RDW: 14.5 % (ref 11.5–15.5)
WBC: 7.5 10*3/uL (ref 4.0–10.5)

## 2015-02-06 MED ORDER — CIPROFLOXACIN HCL 500 MG PO TABS: 500.0000 mg | ORAL_TABLET | Freq: Two times a day (BID) | ORAL | Status: DC

## 2015-02-06 MED ORDER — LITHIUM CARBONATE ER 300 MG PO TBCR: 1200.0000 mg | EXTENDED_RELEASE_TABLET | Freq: Every day | ORAL | Status: DC

## 2015-02-06 MED ORDER — CIPROFLOXACIN HCL 250 MG PO TABS
500.0000 mg | ORAL_TABLET | Freq: Two times a day (BID) | ORAL | Status: DC
Start: 2015-02-06 — End: 2015-02-06
  Administered 2015-02-06: 500 mg via ORAL
  Filled 2015-02-06: qty 2

## 2015-02-06 MED ORDER — POTASSIUM CHLORIDE CRYS ER 20 MEQ PO TBCR
40.0000 meq | EXTENDED_RELEASE_TABLET | Freq: Once | ORAL | Status: AC
  Administered 2015-02-06: 40 meq via ORAL
  Filled 2015-02-06: qty 2

## 2015-02-06 MED ORDER — CLONAZEPAM 0.5 MG PO TABS: 0.5000 mg | ORAL_TABLET | Freq: Two times a day (BID) | ORAL | Status: DC

## 2015-02-06 NOTE — Progress Notes (Signed)
Pt has left hospital and is being transported to [facility]. Pt left in stable condition.

## 2015-02-06 NOTE — Clinical Social Work Note (Signed)
Pt d/c today back to [facility]. Guardian notified of no bed offers and agreeable to return. Facility also aware and will pick up pt this afternoon. D/C summary and FL2 faxed to facility and guardian at [name]'s request.  Derenda FennelKara Nimrod Wendt, LCSW [phone]

## 2015-02-06 NOTE — Discharge Summary (Signed)
Physician Discharge Summary  Jason Patterson. WUJ:[id number] DOB: Mar 23, 1956 DOA: 02/02/2015  PCP: Colon Branch, MD  Admit date: 02/02/2015 Discharge date: 02/06/2015  Time spent: 25 minutes  Recommendations for Outpatient Follow-up:  1. Follow up with PCP in 1-2 weeks 2. Recommend repeating lithium levels on 3/23 or 3/24. Levels were subtherapeutic and trending down despite continuing lithium. Pt therefore discharged on  lithium QHS 3. Consider weaning sedating meds as tolerated  Discharge Diagnoses:  Principal Problem:   Sepsis due to urinary tract infection Active Problems:   Bipolar disorder   Generalized anxiety disorder   Essential hypertension, benign   Major depression, chronic   Schizoaffective disorder   Altered mental status   Sepsis   Protein-calorie malnutrition, severe   UTI (lower urinary tract infection)   Discharge Condition: Improved  Diet recommendation: Heart Healthy  [facility] Weights   02/02/15 2023 02/03/15 0122  Weight: 73.483 kg (162 lb) 75.66 kg (166 lb 12.8 oz)    History of present illness:  Please see h and p from 3/18 for details. Briefly, pt presented with altered mental status, ultimately being found to have evidence of UTI with sepsis. Patient was admitted for further work up.  Hospital Course:  1. UTI with sepsis 1. Sepsis resolved 2. Level of alertness is improved, possibly back at baseline 3. Pt is continued on ciprofloxacin 4. Urine cx pos for >100,000 E.coli, pan-sensitive 5. Patient to complete course of oral ciprofloxacin on discharge 2. Schizoaffective disorder, bipolar, depression 1. Has admitted to being very depressed 2. Denies suicidal ideation. Pt declined telepsych consult 3. Continued home psych meds 4. Lithium level was low on presentation. Patient was continued on lithium with persistently low lithium levels 5. Lithium dose was increased to  qhs on 3/20.  6. Would recommend repeating lithium levels at  least twice weekly as an outpatient until levels stabiilze 7. Discussed with POA who states patient's affect is very labile at baseline. Seems to be in good spirits currently 3. HTN 1. BP remains stable, controlled 4. Altered mental status 1. Improved level of alertness seems to be at baseline 5. COPD 1. No wheezing 2. Stable 6. Seizure disorder 1. On topiramate 2. Seizure free thus far 7. DVT prophylaxis 1. Heparin subQ 8. L flank pain 1. On chart review, pt had older abd xray from 2011 obtained also for L flank pain at that time. On that imaging, pt was noted to have marked colonic stool at the location of the kidneys 2. Improved with cathartics  Consultations:  none  Discharge Exam: Filed Vitals:   02/05/15 2300 02/06/15 0335 02/06/15 0647 02/06/15 0800  BP: 104/89 150/80 107/61   Pulse: 79 70 55   Temp: 99.3 F (37.4 C) 98.6 F (37 C) 98.5 F (36.9 C)   TempSrc: Oral Oral Oral   Resp: Height:      Weight:      SpO2: 100% 98% 98% 96%    General: awake, in nad Cardiovascular: regular, s1, s2 Respiratory: normal resp effort  Discharge Instructions     Medication List    STOP taking these medications        lithium 600 MG capsule  Replaced by:  lithium carbonate 300 MG CR tablet      TAKE these medications        aspirin 81 MG EC tablet  Take 1 tablet (81 mg total) by mouth daily.     budesonide-formoterol 160-4.5 MCG/ACT inhaler  Commonly  known as:  SYMBICORT  Inhale 2 puffs into the lungs 2 (two) times daily.     ciprofloxacin 500 MG tablet  Commonly known as:  CIPRO  Take 1 tablet (500 mg total) by mouth 2 (two) times daily.     clonazePAM 0.5 MG tablet  Commonly known as:  KLONOPIN  Take 0.5 mg by mouth 2 (two) times daily.     clonazePAM 2 MG tablet  Commonly known as:  KLONOPIN  Take 2 mg by mouth 3 (three) times daily as needed for anxiety.     cloNIDine 0.1 MG tablet  Commonly known as:  CATAPRES  Take 1 tablet (0.1 mg  total) by mouth 2 (two) times daily.     Fluticasone-Salmeterol 500-50 MCG/DOSE Aepb  Commonly known as:  ADVAIR  Inhale 1 puff into the lungs 2 (two) times daily.     lithium carbonate 300 MG CR tablet  Commonly known as:  LITHOBID  Take 4 tablets (1,200 mg total) by mouth at bedtime.     loratadine 10 MG tablet  Commonly known as:  CLARITIN  Take 10 mg by mouth daily.     meloxicam 15 MG tablet  Commonly known as:  MOBIC  Take 15 mg by mouth daily.     omeprazole 40 MG capsule  Commonly known as:  PRILOSEC  Take 1 capsule (40 mg total) by mouth daily.     pantoprazole 40 MG tablet  Commonly known as:  PROTONIX  Take 40 mg by mouth daily.     PROAIR HFA 108 (90 BASE) MCG/ACT inhaler  Generic drug:  albuterol  Inhale 1 puff into the lungs 4 (four) times daily.     tamsulosin 0.4 MG Caps capsule  Commonly known as:  FLOMAX  Take 0.4 mg by mouth daily.     temazepam 30 MG capsule  Commonly known as:  RESTORIL  Take 30 mg by mouth at bedtime.     tiotropium 18 MCG inhalation capsule  Commonly known as:  SPIRIVA  Place 1 capsule (18 mcg total) into inhaler and inhale daily.     tiZANidine 4 MG tablet  Commonly known as:  ZANAFLEX  Take 6 mg by mouth 3 (three) times daily.     topiramate 50 MG tablet  Commonly known as:  TOPAMAX  Take 1 tablet (50 mg total) by mouth daily.     traMADol 50 MG tablet  Commonly known as:  ULTRAM  Take 1 tablet (50 mg total) by mouth 3 (three) times daily as needed for moderate pain.     traZODone 150 MG tablet  Commonly known as:  DESYREL  Take 150 mg by mouth at bedtime.       Allergies  Allergen Reactions  . Paxil [Paroxetine Hcl] Other (See Comments)    Told by MD to discontinue use  . Penicillins Other (See Comments)    Family history of allergies; patient's sister took once and died as a result (FYI).  Amoxicillin is ok  . Depakote [Divalproex Sodium] Hives and Swelling  . Fish Allergy Itching       Follow-up  Information    Follow up with Colon Branch, MD. Schedule an appointment as soon as possible for a visit in 1 week.   Specialty:  Internal Medicine   Contact information:   [address] [place] [address] [phone]        The results of significant diagnostics from this hospitalization (including imaging, microbiology, ancillary and  laboratory) are listed below for reference.    Significant Diagnostic Studies: Dg Abd Portable 1v  02/05/2015   CLINICAL DATA:  Left flank pain  EXAM: PORTABLE ABDOMEN - 1 VIEW  COMPARISON:  08/20/2013  FINDINGS: Mild colonic gaseous distention.  No evidence of bowel obstruction.  Visualized osseous structures are within normal limits.  IMPRESSION: Unremarkable abdominal radiograph.   Electronically Signed   By: Charline BillsSriyesh  Krishnan M.D.   On: 02/05/2015 13:01    Microbiology: Recent Results (from the past 240 hour(s))  Urine culture     Status: None   Collection Time: 02/02/15  9:25 PM  Result Value Ref Range Status   Specimen Description URINE, CLEAN CATCH  Final   Special Requests NONE  Final   Colony Count   Final    >=100,000 COLONIES/ML Performed at [facility]    Culture   Final    ESCHERICHIA COLI Performed at [facility]    Report Status 02/05/2015 FINAL  Final   Organism ID, Bacteria ESCHERICHIA COLI  Final      Susceptibility   Escherichia coli - MIC*    AMPICILLIN >=32 RESISTANT Resistant     CEFAZOLIN <=4 SENSITIVE Sensitive     CEFTRIAXONE <=1 SENSITIVE Sensitive     CIPROFLOXACIN <=0.25 SENSITIVE Sensitive     GENTAMICIN <=1 SENSITIVE Sensitive     LEVOFLOXACIN <=0.12 SENSITIVE Sensitive     NITROFURANTOIN <=16 SENSITIVE Sensitive     TOBRAMYCIN <=1 SENSITIVE Sensitive     TRIMETH/SULFA <=20 SENSITIVE Sensitive     PIP/TAZO <=4 SENSITIVE Sensitive     * ESCHERICHIA COLI     Labs: Basic Metabolic Panel:  Recent Labs Lab 02/02/15 2103 02/04/15 0634 02/05/15 0636 02/06/15 0455  NA 133* 142  140 139  K 3.6 3.4* 3.4* 3.4*  CL 100 114* 113* 113*  CO2 24 23 18* 20  GLUCOSE 95 130* 105* 137*  BUN 15 9 6 10   CREATININE 1.14 1.02 0.85 0.93  CALCIUM 8.8 8.6 8.7 8.3*  MG  --   --  1.9  --    Liver Function Tests: No results for input(s): AST, ALT, ALKPHOS, BILITOT, PROT, ALBUMIN in the last 168 hours. No results for input(s): LIPASE, AMYLASE in the last 168 hours. No results for input(s): AMMONIA in the last 168 hours. CBC:  Recent Labs Lab 02/02/15 2103 02/04/15 0634 02/05/15 0636 02/06/15 0455  WBC 15.1* 6.3 6.0 7.5  NEUTROABS 12.6*  --   --   --   HGB 12.2* 10.4* 12.2* 10.4*  HCT 36.3* 31.7* 36.3* 31.6*  MCV 97.1 98.8 96.3 96.3  PLT 204 146* 193 148*   Cardiac Enzymes: No results for input(s): CKTOTAL, CKMB, CKMBINDEX, TROPONINI in the last 168 hours. BNP: BNP (last 3 results) No results for input(s): BNP in the last 8760 hours.  ProBNP (last 3 results) No results for input(s): PROBNP in the last 8760 hours.  CBG:  Recent Labs Lab 02/03/15 0815  GLUCAP 86    Signed:  Hamad Whyte K  [facility] 02/06/2015, 10:52 AM

## 2015-02-07 NOTE — Progress Notes (Signed)
UR chart review completed.  

## 2015-02-15 ENCOUNTER — Inpatient Hospital Stay (HOSPITAL_COMMUNITY)
Admission: EM | Admit: 2015-02-15 | Discharge: 2015-02-20 | DRG: 199 | Disposition: A | Discharge status: Nursing Home (Includes ICF) | Payer: Medicaid Other | Attending: General Surgery | Admitting: General Surgery

## 2015-02-15 ENCOUNTER — Emergency Department (HOSPITAL_COMMUNITY): Payer: Medicaid Other

## 2015-02-15 ENCOUNTER — Encounter (HOSPITAL_COMMUNITY): Payer: Self-pay

## 2015-02-15 DIAGNOSIS — S272XXA Traumatic hemopneumothorax, initial encounter: Secondary | ICD-10-CM

## 2015-02-15 DIAGNOSIS — E43 Unspecified severe protein-calorie malnutrition: Secondary | ICD-10-CM | POA: Diagnosis present

## 2015-02-15 DIAGNOSIS — I10 Essential (primary) hypertension: Secondary | ICD-10-CM | POA: Diagnosis present

## 2015-02-15 DIAGNOSIS — Z7982 Long term (current) use of aspirin: Secondary | ICD-10-CM

## 2015-02-15 DIAGNOSIS — F250 Schizoaffective disorder, bipolar type: Secondary | ICD-10-CM | POA: Diagnosis present

## 2015-02-15 DIAGNOSIS — J449 Chronic obstructive pulmonary disease, unspecified: Secondary | ICD-10-CM | POA: Diagnosis present

## 2015-02-15 DIAGNOSIS — Z791 Long term (current) use of non-steroidal anti-inflammatories (NSAID): Secondary | ICD-10-CM | POA: Diagnosis not present

## 2015-02-15 DIAGNOSIS — S0083XA Contusion of other part of head, initial encounter: Secondary | ICD-10-CM | POA: Diagnosis present

## 2015-02-15 DIAGNOSIS — Z6823 Body mass index (BMI) 23.0-23.9, adult: Secondary | ICD-10-CM

## 2015-02-15 DIAGNOSIS — S2242XA Multiple fractures of ribs, left side, initial encounter for closed fracture: Secondary | ICD-10-CM | POA: Diagnosis present

## 2015-02-15 DIAGNOSIS — S2249XA Multiple fractures of ribs, unspecified side, initial encounter for closed fracture: Secondary | ICD-10-CM | POA: Diagnosis present

## 2015-02-15 DIAGNOSIS — F17210 Nicotine dependence, cigarettes, uncomplicated: Secondary | ICD-10-CM | POA: Diagnosis present

## 2015-02-15 DIAGNOSIS — D72829 Elevated white blood cell count, unspecified: Secondary | ICD-10-CM | POA: Diagnosis present

## 2015-02-15 DIAGNOSIS — W1789XA Other fall from one level to another, initial encounter: Secondary | ICD-10-CM | POA: Diagnosis present

## 2015-02-15 DIAGNOSIS — Z9181 History of falling: Secondary | ICD-10-CM | POA: Diagnosis not present

## 2015-02-15 DIAGNOSIS — Z888 Allergy status to other drugs, medicaments and biological substances status: Secondary | ICD-10-CM | POA: Diagnosis not present

## 2015-02-15 DIAGNOSIS — W19XXXA Unspecified fall, initial encounter: Secondary | ICD-10-CM | POA: Diagnosis present

## 2015-02-15 DIAGNOSIS — T07XXXA Unspecified multiple injuries, initial encounter: Secondary | ICD-10-CM

## 2015-02-15 DIAGNOSIS — Z91013 Allergy to seafood: Secondary | ICD-10-CM | POA: Diagnosis not present

## 2015-02-15 DIAGNOSIS — R0789 Other chest pain: Secondary | ICD-10-CM | POA: Diagnosis present

## 2015-02-15 DIAGNOSIS — G40909 Epilepsy, unspecified, not intractable, without status epilepticus: Secondary | ICD-10-CM | POA: Diagnosis present

## 2015-02-15 DIAGNOSIS — S060X9A Concussion with loss of consciousness of unspecified duration, initial encounter: Secondary | ICD-10-CM

## 2015-02-15 DIAGNOSIS — Z880 Allergy status to penicillin: Secondary | ICD-10-CM | POA: Diagnosis not present

## 2015-02-15 DIAGNOSIS — F411 Generalized anxiety disorder: Secondary | ICD-10-CM | POA: Diagnosis present

## 2015-02-15 DIAGNOSIS — S0181XA Laceration without foreign body of other part of head, initial encounter: Secondary | ICD-10-CM

## 2015-02-15 DIAGNOSIS — J939 Pneumothorax, unspecified: Secondary | ICD-10-CM

## 2015-02-15 DIAGNOSIS — Z9119 Patient's noncompliance with other medical treatment and regimen: Secondary | ICD-10-CM | POA: Diagnosis present

## 2015-02-15 DIAGNOSIS — S161XXA Strain of muscle, fascia and tendon at neck level, initial encounter: Secondary | ICD-10-CM

## 2015-02-15 DIAGNOSIS — K219 Gastro-esophageal reflux disease without esophagitis: Secondary | ICD-10-CM | POA: Diagnosis present

## 2015-02-15 DIAGNOSIS — S2232XA Fracture of one rib, left side, initial encounter for closed fracture: Secondary | ICD-10-CM

## 2015-02-15 DIAGNOSIS — R509 Fever, unspecified: Secondary | ICD-10-CM

## 2015-02-15 DIAGNOSIS — S270XXA Traumatic pneumothorax, initial encounter: Secondary | ICD-10-CM | POA: Diagnosis present

## 2015-02-15 DIAGNOSIS — S2239XA Fracture of one rib, unspecified side, initial encounter for closed fracture: Secondary | ICD-10-CM | POA: Diagnosis present

## 2015-02-15 HISTORY — DX: Sepsis, unspecified organism: A41.9

## 2015-02-15 HISTORY — DX: Depression, unspecified: F32.A

## 2015-02-15 HISTORY — DX: Major depressive disorder, single episode, unspecified: F32.9

## 2015-02-15 LAB — BASIC METABOLIC PANEL
ANION GAP: 11 (ref 5–15)
BUN: 12 mg/dL (ref 6–23)
CO2: 21 mmol/L (ref 19–32)
Calcium: 9.6 mg/dL (ref 8.4–10.5)
Chloride: 102 mmol/L (ref 96–112)
Creatinine, Ser: 1.2 mg/dL (ref 0.50–1.35)
GFR calc non Af Amer: 65 mL/min — ABNORMAL LOW (ref >90)
GFR, EST AFRICAN AMERICAN: 75 mL/min — AB (ref >90)
GLUCOSE: 96 mg/dL (ref 70–99)
Potassium: 3.7 mmol/L (ref 3.5–5.1)
SODIUM: 134 mmol/L — AB (ref 135–145)

## 2015-02-15 LAB — URINALYSIS, ROUTINE W REFLEX MICROSCOPIC
Bilirubin Urine: NEGATIVE
Glucose, UA: NEGATIVE mg/dL
Ketones, ur: NEGATIVE mg/dL
Leukocytes, UA: NEGATIVE
Nitrite: NEGATIVE
Protein, ur: NEGATIVE mg/dL
Specific Gravity, Urine: <1.005 — ABNORMAL LOW (ref 1.005–1.030)
Urobilinogen, UA: 0.2 mg/dL (ref 0.0–1.0)
pH: 7.5 (ref 5.0–8.0)

## 2015-02-15 LAB — URINE MICROSCOPIC-ADD ON

## 2015-02-15 LAB — CBC WITH DIFFERENTIAL/PLATELET
Basophils Absolute: 0 10*3/uL (ref 0.0–0.1)
Basophils Relative: 0 % (ref 0–1)
EOS ABS: 0 10*3/uL (ref 0.0–0.7)
EOS PCT: 0 % (ref 0–5)
HEMATOCRIT: 39 % (ref 39.0–52.0)
Hemoglobin: 13 g/dL (ref 13.0–17.0)
Lymphocytes Relative: 5 % — ABNORMAL LOW (ref 12–46)
Lymphs Abs: 1 10*3/uL (ref 0.7–4.0)
MCH: 31.9 pg (ref 26.0–34.0)
MCHC: 33.3 g/dL (ref 30.0–36.0)
MCV: 95.6 fL (ref 78.0–100.0)
MONOS PCT: 2 % — AB (ref 3–12)
Monocytes Absolute: 0.4 10*3/uL (ref 0.1–1.0)
NEUTROS ABS: 17.4 10*3/uL — AB (ref 1.7–7.7)
NEUTROS PCT: 93 % — AB (ref 43–77)
Platelets: 360 10*3/uL (ref 150–400)
RBC: 4.08 MIL/uL — AB (ref 4.22–5.81)
RDW: 14.3 % (ref 11.5–15.5)
WBC: 18.9 10*3/uL — ABNORMAL HIGH (ref 4.0–10.5)

## 2015-02-15 LAB — RAPID URINE DRUG SCREEN, HOSP PERFORMED
Amphetamines: NOT DETECTED
Barbiturates: NOT DETECTED
Benzodiazepines: POSITIVE — AB
Cocaine: NOT DETECTED
Opiates: NOT DETECTED
Tetrahydrocannabinol: NOT DETECTED

## 2015-02-15 LAB — TYPE AND SCREEN
ABO/RH(D): A POS
Antibody Screen: NEGATIVE

## 2015-02-15 LAB — RAPID HIV SCREEN (HIV 1/2 AB+AG)
HIV 1/2 ANTIBODIES: NONREACTIVE
HIV-1 P24 Antigen - HIV24: NONREACTIVE

## 2015-02-15 LAB — ETHANOL

## 2015-02-15 LAB — LITHIUM LEVEL: Lithium Lvl: 1.2 mmol/L (ref 0.80–1.40)

## 2015-02-15 LAB — TROPONIN I: Troponin I: <0.03 ng/mL (ref <0.031)

## 2015-02-15 MED ORDER — CLONAZEPAM 0.5 MG PO TABS
0.5000 mg | ORAL_TABLET | Freq: Two times a day (BID) | ORAL | Status: DC
  Administered 2015-02-15 – 2015-02-20 (×10): 0.5 mg via ORAL
  Filled 2015-02-15 (×10): qty 1

## 2015-02-15 MED ORDER — OXYCODONE HCL 5 MG PO TABS
5.0000 mg | ORAL_TABLET | ORAL | Status: DC | PRN
  Administered 2015-02-16: 5 mg via ORAL
  Filled 2015-02-15 (×2): qty 1

## 2015-02-15 MED ORDER — TETANUS-DIPHTH-ACELL PERTUSSIS 5-2.5-18.5 LF-MCG/0.5 IM SUSP: 0.5000 mL | Freq: Once | INTRAMUSCULAR | Status: DC

## 2015-02-15 MED ORDER — LACTULOSE 10 GM/15ML PO SOLN
10.0000 g | Freq: Every day | ORAL | Status: DC
  Administered 2015-02-15 – 2015-02-20 (×6): 10 g via ORAL
  Filled 2015-02-15 (×6): qty 15

## 2015-02-15 MED ORDER — TIOTROPIUM BROMIDE MONOHYDRATE 18 MCG IN CAPS
18.0000 ug | ORAL_CAPSULE | Freq: Every day | RESPIRATORY_TRACT | Status: DC
  Administered 2015-02-16 – 2015-02-18 (×2): 18 ug via RESPIRATORY_TRACT
  Filled 2015-02-15: qty 5

## 2015-02-15 MED ORDER — ONDANSETRON HCL 4 MG/2ML IJ SOLN
4.0000 mg | Freq: Once | INTRAMUSCULAR | Status: AC
  Administered 2015-02-15: 4 mg via INTRAVENOUS

## 2015-02-15 MED ORDER — TETANUS-DIPHTH-ACELL PERTUSSIS 5-2.5-18.5 LF-MCG/0.5 IM SUSP
INTRAMUSCULAR | Status: AC
  Filled 2015-02-15: qty 0.5

## 2015-02-15 MED ORDER — PANTOPRAZOLE SODIUM 40 MG PO TBEC
40.0000 mg | DELAYED_RELEASE_TABLET | Freq: Every day | ORAL | Status: DC
Start: 2015-02-16 — End: 2015-02-21
  Administered 2015-02-16 – 2015-02-20 (×5): 40 mg via ORAL
  Filled 2015-02-15 (×5): qty 1

## 2015-02-15 MED ORDER — TEMAZEPAM 15 MG PO CAPS
30.0000 mg | ORAL_CAPSULE | Freq: Every evening | ORAL | Status: DC | PRN
  Administered 2015-02-17: 30 mg via ORAL
  Filled 2015-02-15: qty 2

## 2015-02-15 MED ORDER — ONDANSETRON HCL 4 MG/2ML IJ SOLN
INTRAMUSCULAR | Status: AC
  Filled 2015-02-15: qty 2

## 2015-02-15 MED ORDER — FENTANYL CITRATE 0.05 MG/ML IJ SOLN
INTRAMUSCULAR | Status: AC
  Filled 2015-02-15: qty 2

## 2015-02-15 MED ORDER — IOHEXOL 300 MG/ML  SOLN
100.0000 mL | Freq: Once | INTRAMUSCULAR | Status: AC | PRN
  Administered 2015-02-15: 100 mL via INTRAVENOUS

## 2015-02-15 MED ORDER — DOCUSATE SODIUM 100 MG PO CAPS
100.0000 mg | ORAL_CAPSULE | Freq: Two times a day (BID) | ORAL | Status: DC
  Administered 2015-02-15 – 2015-02-20 (×10): 100 mg via ORAL
  Filled 2015-02-15 (×10): qty 1

## 2015-02-15 MED ORDER — TOPIRAMATE 25 MG PO TABS
50.0000 mg | ORAL_TABLET | Freq: Every day | ORAL | Status: DC
  Administered 2015-02-17 – 2015-02-20 (×4): 50 mg via ORAL
  Filled 2015-02-15 (×6): qty 2

## 2015-02-15 MED ORDER — METHOCARBAMOL 750 MG PO TABS
750.0000 mg | ORAL_TABLET | Freq: Three times a day (TID) | ORAL | Status: DC
  Administered 2015-02-15 – 2015-02-20 (×15): 750 mg via ORAL
  Filled 2015-02-15 (×15): qty 1

## 2015-02-15 MED ORDER — SODIUM CHLORIDE 0.9 % IV BOLUS (SEPSIS)
500.0000 mL | Freq: Once | INTRAVENOUS | Status: AC
Start: 2015-02-15 — End: 2015-02-15
  Administered 2015-02-15: 500 mL via INTRAVENOUS

## 2015-02-15 MED ORDER — ENOXAPARIN SODIUM 40 MG/0.4ML ~~LOC~~ SOLN
40.0000 mg | SUBCUTANEOUS | Status: DC
  Administered 2015-02-17 – 2015-02-20 (×4): 40 mg via SUBCUTANEOUS
  Filled 2015-02-15 (×5): qty 0.4

## 2015-02-15 MED ORDER — ALBUTEROL SULFATE HFA 108 (90 BASE) MCG/ACT IN AERS: 1.0000 | INHALATION_SPRAY | Freq: Four times a day (QID) | RESPIRATORY_TRACT | Status: DC

## 2015-02-15 MED ORDER — MORPHINE SULFATE 2 MG/ML IJ SOLN
1.0000 mg | INTRAMUSCULAR | Status: DC | PRN
  Administered 2015-02-15: 4 mg via INTRAVENOUS
  Administered 2015-02-15: 2 mg via INTRAVENOUS
  Administered 2015-02-15 – 2015-02-16 (×4): 4 mg via INTRAVENOUS
  Filled 2015-02-15 (×6): qty 2

## 2015-02-15 MED ORDER — MORPHINE SULFATE 4 MG/ML IJ SOLN
INTRAMUSCULAR | Status: AC
  Filled 2015-02-15: qty 1

## 2015-02-15 MED ORDER — TAMSULOSIN HCL 0.4 MG PO CAPS
0.4000 mg | ORAL_CAPSULE | Freq: Every day | ORAL | Status: DC
  Administered 2015-02-16 – 2015-02-20 (×5): 0.4 mg via ORAL
  Filled 2015-02-15 (×5): qty 1

## 2015-02-15 MED ORDER — POTASSIUM CHLORIDE IN NACL 20-0.9 MEQ/L-% IV SOLN
INTRAVENOUS | Status: DC
  Administered 2015-02-15: 22:00:00 via INTRAVENOUS
  Filled 2015-02-15: qty 1000

## 2015-02-15 MED ORDER — ONDANSETRON HCL 4 MG PO TABS
4.0000 mg | ORAL_TABLET | Freq: Four times a day (QID) | ORAL | Status: DC | PRN
  Administered 2015-02-20: 4 mg via ORAL
  Filled 2015-02-15: qty 1

## 2015-02-15 MED ORDER — ALBUTEROL SULFATE (2.5 MG/3ML) 0.083% IN NEBU
2.5000 mg | INHALATION_SOLUTION | Freq: Four times a day (QID) | RESPIRATORY_TRACT | Status: DC
  Administered 2015-02-16 (×4): 2.5 mg via RESPIRATORY_TRACT
  Filled 2015-02-15 (×6): qty 3

## 2015-02-15 MED ORDER — ETOMIDATE 2 MG/ML IV SOLN
INTRAVENOUS | Status: AC
  Administered 2015-02-15: 1.5 mg
  Administered 2015-02-15: 5 mg
  Filled 2015-02-15: qty 10

## 2015-02-15 MED ORDER — ONDANSETRON HCL 4 MG/2ML IJ SOLN
4.0000 mg | Freq: Four times a day (QID) | INTRAMUSCULAR | Status: DC | PRN
  Administered 2015-02-16 – 2015-02-17 (×2): 4 mg via INTRAVENOUS
  Filled 2015-02-15 (×4): qty 2

## 2015-02-15 MED ORDER — LITHIUM CARBONATE ER 450 MG PO TBCR
1200.0000 mg | EXTENDED_RELEASE_TABLET | Freq: Every day | ORAL | Status: DC
  Administered 2015-02-16 – 2015-02-19 (×4): 1200 mg via ORAL
  Filled 2015-02-15 (×6): qty 1

## 2015-02-15 MED ORDER — ACETAMINOPHEN 325 MG PO TABS
650.0000 mg | ORAL_TABLET | Freq: Four times a day (QID) | ORAL | Status: DC
  Administered 2015-02-15 – 2015-02-16 (×2): 650 mg via ORAL
  Filled 2015-02-15 (×4): qty 2

## 2015-02-15 MED ORDER — LIDOCAINE HCL (PF) 1 % IJ SOLN
INTRAMUSCULAR | Status: AC
  Filled 2015-02-15: qty 5

## 2015-02-15 MED ORDER — MORPHINE SULFATE 2 MG/ML IJ SOLN
2.0000 mg | Freq: Once | INTRAMUSCULAR | Status: AC
  Administered 2015-02-15: 2 mg via INTRAVENOUS
  Filled 2015-02-15: qty 1

## 2015-02-15 MED ORDER — TRAZODONE HCL 150 MG PO TABS
150.0000 mg | ORAL_TABLET | Freq: Every day | ORAL | Status: DC
Start: 2015-02-16 — End: 2015-02-21
  Administered 2015-02-16 – 2015-02-19 (×4): 150 mg via ORAL
  Filled 2015-02-15 (×4): qty 1

## 2015-02-15 MED ORDER — MORPHINE SULFATE 4 MG/ML IJ SOLN
4.0000 mg | Freq: Once | INTRAMUSCULAR | Status: AC
  Administered 2015-02-15: 4 mg via INTRAVENOUS

## 2015-02-15 MED ORDER — FENTANYL CITRATE 0.05 MG/ML IJ SOLN
INTRAMUSCULAR | Status: AC | PRN
  Administered 2015-02-15 (×2): 100 ug via INTRAVENOUS

## 2015-02-15 MED ORDER — BISACODYL 10 MG RE SUPP: 10.0000 mg | Freq: Every day | RECTAL | Status: DC | PRN

## 2015-02-15 MED ORDER — BUDESONIDE-FORMOTEROL FUMARATE 160-4.5 MCG/ACT IN AERO
2.0000 | INHALATION_SPRAY | Freq: Two times a day (BID) | RESPIRATORY_TRACT | Status: DC
  Administered 2015-02-17 – 2015-02-20 (×5): 2 via RESPIRATORY_TRACT
  Filled 2015-02-15 (×3): qty 6
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
## 2015-02-15 NOTE — ED Notes (Signed)
Chem 8 drawn but read bad sample.  Dr. Bebe ShaggyWickline aware, instructed to do the CT without the creatinine results.
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
## 2015-02-15 NOTE — ED Notes (Signed)
Pt awake and talking .  States he feels better.
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
## 2015-02-15 NOTE — ED Notes (Signed)
Pt had ponytail rubberband wrapped around penis.  Unable to remove .  Had to cut band and remove.  No discoloration to penis.

## 2015-02-15 NOTE — ED Notes (Signed)
Pt refused to be cathed.

## 2015-02-15 NOTE — Sedation Documentation (Signed)
Chest tube inserted .  Dr. Bebe ShaggyWickline suturing up site.

## 2015-02-15 NOTE — ED Notes (Signed)
Belongings taken from pt.  Pocket knife removed from shorts and placed in belonging bag.  Shirt cut off pt and shorts removed and placed in a bag.  Tennis shoes placed in a belonging bag.

## 2015-02-15 NOTE — Sedation Documentation (Signed)
Pt starting to talk 

## 2015-02-16 ENCOUNTER — Encounter (HOSPITAL_COMMUNITY): Payer: Self-pay | Admitting: General Practice

## 2015-02-16 ENCOUNTER — Inpatient Hospital Stay (HOSPITAL_COMMUNITY): Payer: Medicaid Other

## 2015-02-16 DIAGNOSIS — W19XXXA Unspecified fall, initial encounter: Secondary | ICD-10-CM | POA: Diagnosis present

## 2015-02-16 LAB — COMPREHENSIVE METABOLIC PANEL
ALT: 15 U/L (ref 0–53)
AST: 23 U/L (ref 0–37)
Albumin: 3.5 g/dL (ref 3.5–5.2)
Alkaline Phosphatase: 79 U/L (ref 39–117)
Anion gap: 8 (ref 5–15)
BUN: 8 mg/dL (ref 6–23)
CALCIUM: 9.2 mg/dL (ref 8.4–10.5)
CO2: 21 mmol/L (ref 19–32)
Chloride: 107 mmol/L (ref 96–112)
Creatinine, Ser: 1.16 mg/dL (ref 0.50–1.35)
GFR calc Af Amer: 78 mL/min — ABNORMAL LOW (ref >90)
GFR, EST NON AFRICAN AMERICAN: 67 mL/min — AB (ref >90)
GLUCOSE: 109 mg/dL — AB (ref 70–99)
POTASSIUM: 4.1 mmol/L (ref 3.5–5.1)
SODIUM: 136 mmol/L (ref 135–145)
TOTAL PROTEIN: 5.7 g/dL — AB (ref 6.0–8.3)
Total Bilirubin: 1 mg/dL (ref 0.3–1.2)

## 2015-02-16 LAB — CBC
HCT: 38.6 % — ABNORMAL LOW (ref 39.0–52.0)
Hemoglobin: 12.7 g/dL — ABNORMAL LOW (ref 13.0–17.0)
MCH: 31.9 pg (ref 26.0–34.0)
MCHC: 32.9 g/dL (ref 30.0–36.0)
MCV: 97 fL (ref 78.0–100.0)
Platelets: 343 10*3/uL (ref 150–400)
RBC: 3.98 MIL/uL — AB (ref 4.22–5.81)
RDW: 14.9 % (ref 11.5–15.5)
WBC: 13.4 10*3/uL — AB (ref 4.0–10.5)

## 2015-02-16 LAB — HEPATITIS PANEL, ACUTE
HCV Ab: NEGATIVE
HEP B C IGM: NONREACTIVE
Hep A IgM: NONREACTIVE
Hepatitis B Surface Ag: NEGATIVE

## 2015-02-16 MED ORDER — TIZANIDINE HCL 2 MG PO TABS
6.0000 mg | ORAL_TABLET | Freq: Three times a day (TID) | ORAL | Status: DC
Start: 2015-02-16 — End: 2015-02-21
  Administered 2015-02-16 – 2015-02-20 (×13): 6 mg via ORAL
  Filled 2015-02-16: qty 3
  Filled 2015-02-16 (×2): qty 1
  Filled 2015-02-16 (×7): qty 3
  Filled 2015-02-16: qty 1

## 2015-02-16 MED ORDER — MELOXICAM 15 MG PO TABS
15.0000 mg | ORAL_TABLET | Freq: Every day | ORAL | Status: DC
  Administered 2015-02-16 – 2015-02-20 (×5): 15 mg via ORAL
  Filled 2015-02-16 (×3): qty 1
  Filled 2015-02-16: qty 2
  Filled 2015-02-16 (×2): qty 1

## 2015-02-16 MED ORDER — OXYCODONE HCL 5 MG PO TABS
5.0000 mg | ORAL_TABLET | ORAL | Status: DC | PRN
  Administered 2015-02-16 – 2015-02-20 (×13): 15 mg via ORAL
  Filled 2015-02-16 (×13): qty 3

## 2015-02-16 MED ORDER — NAPROXEN 250 MG PO TABS: 500.0000 mg | ORAL_TABLET | Freq: Two times a day (BID) | ORAL | Status: DC

## 2015-02-16 MED ORDER — ENSURE ENLIVE PO LIQD
237.0000 mL | Freq: Three times a day (TID) | ORAL | Status: DC
  Administered 2015-02-16 – 2015-02-20 (×10): 237 mL via ORAL

## 2015-02-16 MED ORDER — TRAMADOL HCL 50 MG PO TABS
100.0000 mg | ORAL_TABLET | Freq: Four times a day (QID) | ORAL | Status: DC
  Administered 2015-02-17 – 2015-02-20 (×10): 100 mg via ORAL
  Filled 2015-02-16 (×13): qty 2

## 2015-02-16 MED ORDER — MORPHINE SULFATE 2 MG/ML IJ SOLN
2.0000 mg | INTRAMUSCULAR | Status: DC | PRN
  Administered 2015-02-17 – 2015-02-20 (×5): 2 mg via INTRAVENOUS
  Filled 2015-02-16 (×5): qty 1

## 2015-02-16 NOTE — Progress Notes (Signed)
Patient ID: Horald ChestnutGordon L Vaquerano, male   DOB: 1956-01-29, 59 y.o.   MRN: [id number]   LOS: 1 day   Subjective: C/o rib pain.   Objective: Vital signs in last 24 hours: Temp:  [97.5 F (36.4 C)-99.2 F (37.3 C)] 98.7 F (37.1 C) (03/31 0400) Pulse Rate:  [76-95] 76 (03/31 0400) Resp:  [13-28] 18 (03/31 0400) BP: (96-149)/(66-113) 112/72 mmHg (03/31 0400) SpO2:  [89 %-100 %] 91 % (03/31 0400) Weight:  [67.5 kg (148 lb 13 oz)-70.308 kg (155 lb)] 67.8 kg (149 lb 7.6 oz) (03/31 0400) Last BM Date: 02/14/15   Laboratory  CBC  Recent Labs  02/15/15 1204 02/16/15 0454  WBC 18.9* 13.4*  HGB 13.0 12.7*  HCT 39.0 38.6*  PLT 360 343   BMET  Recent Labs  02/15/15 1218 02/16/15 0454  NA 134* 136  K 3.7 4.1  CL 102 107  CO2 21 21  GLUCOSE 96 109*  BUN 12 8  CREATININE 1.20 1.16  CALCIUM 9.6 9.2    Radiology Results CXR: Chest tube out of position, side port no longer in pleural cavity. No significant PTX. (Official read pending)   Physical Exam General appearance: alert and no distress Resp: clear to auscultation bilaterally Cardio: regular rate and rhythm GI: normal findings: bowel sounds normal and soft, non-tender   Assessment/Plan: Fall Multiple left rib fxs w/PTX s/p CT -- D/C CT, pulmonary toilet Multiple medical problems -- Home meds FEN -- Add NSAID, increase OxyIR, advance diet, d/c IVF VTE -- SCD's, Lovenox Dispo -- Can transfer to floor, PT/OT    Freeman CaldronMichael J. Jahziah Simonin, PA-C Pager: [phone] General Trauma PA Pager: [phone]  02/16/2015

## 2015-02-16 NOTE — Progress Notes (Signed)
INITIAL NUTRITION ASSESSMENT  DOCUMENTATION CODES Per approved criteria  -Severe malnutrition in the context of chronic illness   INTERVENTION: Ensure Enlive po TID, each supplement provides 350 kcal and 20 grams of protein  Downgrade diet to Dysphagia III due to pt's lack of teeth  NUTRITION DIAGNOSIS: Malnutrition related to chronic illness as evidenced by severe fat and muscle depletion.   Goal: Pt to meet >/= 90% of their estimated nutrition needs   Monitor:  PO intake, supplement acceptance, weight trends  Reason for Assessment: Pt identified as at nutrition risk on the Malnutrition Screen Tool  ASSESSMENT: Pt admitted after fall at his group home, pt had rib fxs and PTX, chest tube was inserted which has now been removed.    By hx pt lives in group home, during last admission pt reported that he did not like the menu repetition there and was losing weight.   Per pt he is not interested in food. Pt in pain sitter in room. Lunch at bedside untouched. Pt has no teeth and reports trouble chewing and would like to have soft foods.   Nutrition Focused Physical Exam:  Subcutaneous Fat:  Orbital Region: mild/moderate depletino Upper Arm Region: severe depletion Thoracic and Lumbar Region: unable to assess   Muscle:  Temple Region: severe depletion Clavicle Bone Region: severe depletion Clavicle and Acromion Bone Region: severe depeltion Scapular Bone Region: unable to assess Dorsal Hand: severe depletion Patellar Region: severe depletion Anterior Thigh Region: severe depletion Posterior Calf Region: severe depletion  Edema: not present   Height: Ht Readings from Last 1 Encounters:  02/15/15  (1.702 m)    Weight: Wt Readings from Last 1 Encounters:  02/16/15 149 lb 7.6 oz (67.8 kg)    Ideal Body Weight: 67.2 kg   % Ideal Body Weight: 101%  Wt Readings from Last 10 Encounters:  02/16/15 149 lb 7.6 oz (67.8 kg)  02/03/15 166 lb 12.8 oz (75.66 kg)   08/23/14 173 lb (78.472 kg)  06/23/14 196 lb (88.905 kg)  05/19/14 204 lb (92.534 kg)  05/14/14 204 lb (92.534 kg)  05/02/14 204 lb (92.534 kg)  05/01/14 199 lb 15.3 oz (90.7 kg)  04/27/14 198 lb (89.812 kg)  01/21/14 215 lb (97.523 kg)    Usual Body Weight: 232 lb year ago  % Usual Body Weight: 64%  BMI:  Body mass index is 23.41 kg/(m^2).  Estimated Nutritional Needs: Kcal: 1900-2100 Protein: 95-115 grams Fluid: > 1.9 L/day  Skin: incision  Diet Order: Diet regular Room service appropriate?: Yes; Fluid consistency:: Thin  EDUCATION NEEDS: -No education needs identified at this time   Intake/Output Summary (Last 24 hours) at 02/16/15 1426 Last data filed at 02/16/15 1400  Gross per 24 hour  Intake      0 ml  Output   2005 ml  Net  -2005 ml    Last BM: 3/29   Labs:   Recent Labs Lab 02/15/15 1218 02/16/15 0454  NA 134* 136  K 3.7 4.1  CL 102 107  CO2 21 21  BUN 12 8  CREATININE 1.20 1.16  CALCIUM 9.6 9.2  GLUCOSE 96 109*    CBG (last 3)  No results for input(s): GLUCAP in the last 72 hours.  Scheduled Meds: . acetaminophen  650 mg Oral 4 times per day  . albuterol  2.5 mg Nebulization QID  . budesonide-formoterol  2 puff Inhalation BID  . clonazePAM  0.5 mg Oral BID  . docusate sodium  100 mg  Oral BID  . enoxaparin (LOVENOX) injection  40 mg Subcutaneous Q24H  . lactulose  10 g Oral Daily  . lithium carbonate  1,200 mg Oral QHS  . meloxicam  15 mg Oral Daily  . methocarbamol  750 mg Oral TID  . pantoprazole  40 mg Oral Daily  . tamsulosin  0.4 mg Oral Daily  . tiotropium  18 mcg Inhalation Daily  . tiZANidine  6 mg Oral TID  . topiramate  50 mg Oral Daily  . traMADol  100 mg Oral 4 times per day  . traZODone  150 mg Oral QHS    Continuous Infusions:   Kendell BaneHeather Avelyn Touch RD, LDN, CNSC [phone] Pager [phone] After Hours Pager

## 2015-02-16 NOTE — Progress Notes (Signed)
PT Cancellation Note  Patient Details Name: Jason Patterson MRN: [id number] DOB: 1956-06-22   Cancelled Treatment:    Reason Eval/Treat Not Completed: Patient declined, no reason specified;Other (comment) (Pt has some issues wtih mental health, nsg aware of refusal).  PT had nursing remove IV and clear obstacles but pt is not willing to get OOB until he "speaks to the doctor".  Reason for speaking to doctor is not clear, again nursing aware and they are having refusal issues as well.   Jason Patterson, Jason Patterson 02/16/2015, 9:30 AM   Jason Patterson, PT MS Acute Rehab Dept. Number: [phone]

## 2015-02-16 NOTE — Progress Notes (Signed)
Prior to chest tube removal, this RN noted sucking noise from left lateral chest tube site. Promptly gathered supplies and removed chest tube with no complications. Chest tube was only inserted 1.5 inches-after previous partial removal by patient during prior shift. Will continue to monitor respiratory status, currently on 2LPM unlabored.   1207: Pt transferred to 6N04, [facility], RN at [phone].

## 2015-02-16 NOTE — Progress Notes (Signed)
At approximately 06:20, entered patient's room to deliver 06:00 medications and requested pain medication. Xray technician at bedside finishing portable chest xray. Upon prepping to access patient's IV line, found bed linens and patient's gown saturated with serous fluid and blood. Chest tube drainage system checked and found to be leaking air in all chambers.  Immediately called for assistance and rapid response. Paged MD on call for trauma service and began providing care to patient.  On arrival, rapid response assisted to reinforce patient's chest tube dressing with petrolatum gauze and several 4x4 dressings.  Taped in place liberally.  Leakage was able to be partially controlled and patient's vital signs were stabilized.  Patient was uncooperative and agitated throughout.  Awaiting further orders/intervention by MD.  Patient is currently stable/midly lethargic from pain medication.  Continuing to closely monitor.

## 2015-02-16 NOTE — Progress Notes (Signed)
Clinical Social Work Department BRIEF PSYCHOSOCIAL ASSESSMENT 02/16/2015  Patient:  Jason Patterson,Jason Patterson     Account Number:  [id number]     Admit date:  02/15/2015  Clinical Social Worker:  Illene SilverRAKE,Tyrrell Stephens, LCSW  Date/Time:  02/16/2015 08:53 PM  Referred by:  CSW  Date Referred:   Referred for  ALF Placement   Other Referral:   [facility].   Interview type:  Other - See comment Other interview type:   Chart review. Information from pt's guardian, [name] of [employer] guardianship agency.    PSYCHOSOCIAL DATA Living Status:  FACILITY Admitted from facility:   Level of care:  Family Care Home Primary support name:  [name] Massenburg Primary support relationship to patient:  NONE Degree of support available:   Guardian.    CURRENT CONCERNS Current Concerns  Post-Acute Placement   Other Concerns:    SOCIAL WORK ASSESSMENT / PLAN CSW spoke briefly with pt's guardian via telephone re: pt's d/c plan.  Ms. Pollyann KennedyMassenburg shared that she is on vacation, but was aware that pt was injured and admitted to the hospital. CSW provided brief update on pt's condition to guardian.  Ms. Pollyann KennedyMassenburg questioned pt's need for a higher LOC in light of his insures and CSW shared that PT/OT c/s were pending.  Because she is on vacation, CSW was referred to other guardians at [employer] for additional information/communication: Judeth CornfieldStephanie [phone] or Erica at x 1003.  The emergency number at the agency is [phone].  CSW will continue to follow for placement, return to ALF vs SNF.   Assessment/plan status:  Psychosocial Support/Ongoing Assessment of Needs Other assessment/ plan:   F/u with [facility] re: pt's return, as appropriate.    ? SNF search/Level 2 [place]   Information/referral to community resources:    PATIENT'S/FAMILY'S RESPONSE TO PLAN OF CARE: Guardian on vacation.  Appreciative of update on pt condition.  Referred to her  co-workers for f/u.

## 2015-02-17 ENCOUNTER — Inpatient Hospital Stay (HOSPITAL_COMMUNITY): Payer: Medicaid Other

## 2015-02-17 MED ORDER — ALBUTEROL SULFATE (2.5 MG/3ML) 0.083% IN NEBU: 2.5000 mg | INHALATION_SOLUTION | RESPIRATORY_TRACT | Status: DC | PRN

## 2015-02-17 MED ORDER — ALBUTEROL SULFATE (2.5 MG/3ML) 0.083% IN NEBU
2.5000 mg | INHALATION_SOLUTION | Freq: Three times a day (TID) | RESPIRATORY_TRACT | Status: DC
  Administered 2015-02-17 – 2015-02-20 (×10): 2.5 mg via RESPIRATORY_TRACT
  Filled 2015-02-17 (×11): qty 3

## 2015-02-17 NOTE — Progress Notes (Signed)
Patient ID: Jason Patterson, male   DOB: 01/24/1956, 59 y.o.   MRN: [id number]   LOS: 2 days   Subjective: No new c/o. Still quite distrustful.   Objective: Vital signs in last 24 hours: Temp:  [98.5 F (36.9 C)-100.2 F (37.9 C)] 98.8 F (37.1 C) (04/01 0522) Pulse Rate:  [63-93] 63 (04/01 0522) Resp:  [18-19] 18 (04/01 0522) BP: (103-105)/(62-72) 103/63 mmHg (04/01 0522) SpO2:  [90 %-96 %] 90 % (04/01 0522) Last BM Date: 02/14/15   IS: 1000ml   Radiology Resuls PORTABLE CHEST - 1 VIEW  COMPARISON: 02/16/2015  FINDINGS: Cardiomediastinal silhouette is stable. Less chest tube has been removed. Left rib fractures again noted. Stable left lower lobe atelectasis or lung contusion. No pneumothorax. Subcutaneous emphysema left lower chest wall again noted. Multiple calcified bilateral granulomas again noted.  IMPRESSION: Left chest tube has been removed. Stable left lower lobe atelectasis or lung contusion. No pneumothorax. Subcutaneous emphysema left lower chest wall, left rib fractures again noted.   Electronically Signed  By: Natasha MeadLiviu Pop M.D.  On: 02/17/2015 08:01   Physical Exam General appearance: alert and no distress Resp: clear to auscultation bilaterally Cardio: regular rate and rhythm GI: normal findings: bowel sounds normal and soft, non-tender   Assessment/Plan: Fall Multiple left rib fxs w/PTX s/p CT -- Pulmonary toilet Multiple medical problems -- Home meds FEN -- No issues VTE -- SCD's, Lovenox Dispo -- PT/OT    Freeman CaldronMichael J. Deklynn Charlet, PA-C Pager: [phone] General Trauma PA Pager: [phone]  02/17/2015

## 2015-02-17 NOTE — Clinical Documentation Improvement (Signed)
Presents with traumatic pneumothorax with multiple rib fractures after falling.  Patient has been evaluated by Nutritional Services and notes Severe Malnutrition with BMI of 24. Recommendations include Ensure Enlive po TID and Dysphagia diet d/t lack of teeth.  Please assess consult and render your opinion in next progress note and include in discharge summary if applicable.  Thank You, Shellee MiloEileen T Alexandre Faries ,RN Clinical Documentation Specialist:  [phone]  [facility]- Health Information Management

## 2015-02-17 NOTE — Evaluation (Signed)
Physical Therapy Evaluation Patient Details Name: Jason Patterson MRN: [id number] DOB: 08-15-1956 Today'Patterson Date: 02/17/2015   History of Present Illness  59 y.o. male Patterson/p mechanical fall resulting in Multiple left rib fxs w/PTX  Clinical Impression  Pt admitted with the above complications. Currently with functional limitations due to the deficits listed below (see PT Problem List). Ambulates with mild instability up to 550 feet today at a min guard level. Refuses to use cane for stability. Did not require physical assist during therapy session. SpO2 dropped to 86% on room air while ambulating, required 3L supplemental O2 at rest to bring sats to 94%. Pt will benefit from skilled PT to increase their independence and safety with mobility to allow discharge to the venue listed below.       Follow Up Recommendations Home health PT;Supervision for mobility/OOB    Equipment Recommendations  Other (comment) (Recommended cane, patient refuses)    Recommendations for Other Services       Precautions / Restrictions Precautions Precautions: Fall Restrictions Weight Bearing Restrictions: No      Mobility  Bed Mobility Overal bed mobility: Modified Independent                Transfers Overall transfer level: Needs assistance Equipment used: None Transfers: Sit to/from Stand Sit to Stand: Supervision         General transfer comment: supervision for safety. Did not require physical assist. Pt does not liked to be touched.  Ambulation/Gait Ambulation/Gait assistance: Min guard Ambulation Distance (Feet): 550 Feet Assistive device: None Gait Pattern/deviations: Step-through pattern;Staggering left;Staggering right;Trunk flexed;Decreased stride length Gait velocity: decreased   General Gait Details: Close guard for safety with intermittent bouts of staggering to Lt and right noted however pt was able to self correct. Recommended pt use cane for extra stability however he  refused. Cues for awareness and upright posture with forward gaze. States he can look up but does not wish to do so.  Stairs            Wheelchair Mobility    Modified Rankin (Stroke Patients Only)       Balance Overall balance assessment: Needs assistance;History of Falls Sitting-balance support: No upper extremity supported;Feet supported Sitting balance-Leahy Scale: Good     Standing balance support: No upper extremity supported Standing balance-Leahy Scale: Fair                               Pertinent Vitals/Pain Pain Assessment: 0-10 Pain Score: 10-Worst pain ever Pain Location: Lt ribs Pain Descriptors / Indicators: Aching;Constant Pain Intervention(Patterson): Monitored during session;Repositioned;Other (comment) (states he does not want RN to bring Medication)    Home Living Family/patient expects to be discharged to:: Group home Living Arrangements: Non-relatives/Friends ([facility]) Available Help at Discharge: Other (Comment) (rest home staff) Type of Home: Group Home Home Access: Stairs to enter Entrance Stairs-Rails: None Entrance Stairs-Number of Steps: 2   Home Equipment: None      Prior Function Level of Independence: Independent               Hand Dominance   Dominant Hand: Right    Extremity/Trunk Assessment   Upper Extremity Assessment: Defer to OT evaluation           Lower Extremity Assessment: Overall WFL for tasks assessed      Cervical / Trunk Assessment: Kyphotic  Communication   Communication: No difficulties  Cognition Arousal/Alertness: Awake/alert Behavior  During Therapy: WFL for tasks assessed/performed Overall Cognitive Status: No family/caregiver present to determine baseline cognitive functioning                      General Comments General comments (skin integrity, edema, etc.): SpO2 93% and greater on 3L supplemental O2 at rest, 90% on 2L at rest, 86% on room air while  ambulating.    Exercises        Assessment/Plan    PT Assessment Patient needs continued PT services  PT Diagnosis Abnormality of gait;Acute pain   PT Problem List Decreased strength;Decreased range of motion;Decreased activity tolerance;Decreased balance;Decreased mobility;Decreased coordination;Decreased knowledge of use of DME;Decreased cognition;Decreased safety awareness;Decreased knowledge of precautions;Cardiopulmonary status limiting activity;Pain  PT Treatment Interventions DME instruction;Gait training;Stair training;Functional mobility training;Therapeutic activities;Therapeutic exercise;Balance training;Neuromuscular re-education;Cognitive remediation;Patient/family education;Modalities   PT Goals (Current goals can be found in the Care Plan section) Acute Rehab PT Goals Patient Stated Goal: Go back to group home PT Goal Formulation: With patient Time For Goal Achievement: 03/03/15 Potential to Achieve Goals: Good    Frequency Min 3X/week   Barriers to discharge        Co-evaluation               End of Session Equipment Utilized During Treatment: Oxygen Activity Tolerance: Patient tolerated treatment well Patient left: in bed;with call bell/phone within reach;with bed alarm set;with nursing/sitter in room Nurse Communication: Mobility status         Time: [id number] PT Time Calculation (min) (ACUTE ONLY): 23 min   Charges:   PT Evaluation $Initial PT Evaluation Tier I: 1 Procedure PT Treatments $Gait Training: 8-22 mins   PT G CodesBerton Patterson:        Jason Patterson 02/17/2015, 11:45 AM Jason Patterson, PT [phone]

## 2015-02-17 NOTE — Evaluation (Signed)
Occupational Therapy Evaluation Patient Details Name: Jason Patterson MRN: [id number] DOB: 09-17-1956 Today's Date: 02/17/2015    History of Present Illness 59 y.o. male s/p mechanical fall when a resident of the group home opened the front door hitting the pt and throwing him off balance and pt falling down 3 steps onto concrete with resulting in Multiple left rib fxs w/PTX, CT placed at [facility] before pt trasferred to [facility] 03/18/15. Chest tube D/C'd 02/16/2015. PMHx: Generalized anxiety disorder, Essential hypertension, Major depression, schizoaffective disorder, falls.   Clinical Impression   This 59 yo male admitted with above presents to acute with increased pain, decreased mobility, decreased balance, and a strong will all affecting his safety and independence with BADLs. He will benefit from acute OT without follow up (due to Medicaid) to work on independence and safety with BADLs.    Follow Up Recommendations  No OT follow up (due to Medicaid)    Equipment Recommendations  None recommended by OT       Precautions / Restrictions Precautions Precautions: Fall Restrictions Weight Bearing Restrictions: No      Mobility Bed Mobility Overal bed mobility: Modified Independent                Transfers Overall transfer level: Needs assistance Equipment used: None Transfers: Sit to/from Stand Sit to Stand: Supervision                   ADL Overall ADL's : Needs assistance/impaired Eating/Feeding: Independent;Sitting   Grooming: Set up;Sitting;Supervision/safety   Upper Body Bathing: Set up;Sitting;Supervision/ safety   Lower Body Bathing: Minimal assistance (with S sit<>stand)   Upper Body Dressing : Set up;Sitting   Lower Body Dressing: Minimal assistance (with S sit<>stand)   Toilet Transfer: Min guard   Toileting- Clothing Manipulation and Hygiene: Min guard (with S sit<>stand)                         Pertinent Vitals/Pain Pain  Assessment: 0-10 Pain Score: 5  Pain Location: left ribs Pain Descriptors / Indicators: Aching;Stabbing Pain Intervention(s): Monitored during session;Repositioned     Hand Dominance Right   Extremity/Trunk Assessment Upper Extremity Assessment Upper Extremity Assessment: Overall WFL for tasks assessed           Communication Communication Communication: No difficulties   Cognition Arousal/Alertness: Awake/alert Behavior During Therapy: WFL for tasks assessed/performed Overall Cognitive Status: No family/caregiver present to determine baseline cognitive functioning                                Home Living   Living Arrangements: Non-relatives/Friends Available Help at Discharge:  (staff at group home--pt states they don't help me) Type of Home: Group Home Home Access: Stairs to enter Entrance Stairs-Number of Steps: 2 Entrance Stairs-Rails: None Home Layout: One level     Bathroom Shower/Tub: Tub/shower unit;Curtain Shower/tub characteristics: Engineer, building services: Standard     Home Equipment: None          Prior Functioning/Environment Level of Independence: Independent             OT Diagnosis: Generalized weakness   OT Problem List: Decreased strength;Decreased activity tolerance;Pain;Impaired balance (sitting and/or standing)   OT Treatment/Interventions: Self-care/ADL training;Patient/family education;Balance training;DME and/or AE instruction;Therapeutic activities    OT Goals(Current goals can be found in the care plan section) Acute Rehab OT Goals Patient Stated Goal: Go back to group  home OT Goal Formulation: With patient Time For Goal Achievement: 02/24/15 Potential to Achieve Goals: Good  OT Frequency: Min 2X/week              End of Session Equipment Utilized During Treatment:  (none)  Activity Tolerance:  (limited by only what he wants to do) Patient left: in bed;with call bell/phone within reach;with bed alarm  set   Time: [id number] OT Time Calculation (min): 14 min Charges:  OT General Charges $OT Visit: 1 Procedure OT Evaluation $Initial OT Evaluation Tier I: 1 Procedure     Jason Patterson, Jason Patterson [phone] 02/17/2015, 3:10 PM

## 2015-02-17 NOTE — Progress Notes (Signed)
2350 Patient refused scheduled tylenol and ultram but did take oxycodone 15mg  for c/o of left chest pain. [id number] Patient c/o left chest pain and nausea morphine 2mg  IV and zofran 4mg  given IV. 0229 Patient c/o chest pain at the chest tube site very agitated. Vital signs T100.2 P77 regular B/P 103/72 O2 86%. Patient had been refusing to wear nasal cannula but O2 was applied at 3l/m via nasal cannula and explain the reason and patient has not try to take off . Patient had spilled urine in the bed and refuse for tech or nurse to clean him up. 0300 Patient vomited large amount of dark brown liquid and did finally let the sitter clean him up. Dr. Corliss Skainssuei notified of patient vomiting no orders received. 0330 Patient resting sitter at bedside.

## 2015-02-17 NOTE — Clinical Social Work Note (Signed)
Clinical Social Worker continuing to follow patient and family for support and discharge planning needs.  [facility]  CSW spoke with facility representative who states that patient is able to return over the weekend.  [facility] at discharge [phone]  Please fax FL2 with medications and discharge summary [phone]) when medically ready.  CSW left message with patient guardian Alcario Drought(Erica [phone]) to notify of discharge plans.  Please reach out to patient guardian again and if no success, notify crisis line [phone]) prior to patient discharge.  CSW remains available for support and to facilitate patient discharge needs.  Macario GoldsJesse Delonda Coley, Kentucky[place]CSW [phone]

## 2015-02-18 ENCOUNTER — Inpatient Hospital Stay (HOSPITAL_COMMUNITY): Payer: Medicaid Other

## 2015-02-18 NOTE — Progress Notes (Signed)
  Subjective: Says he had some nausea. O2 sats were 88 this am and placed on oxy  Objective: Vital signs in last 24 hours: Temp:  [98.1 F (36.7 C)-99 F (37.2 C)] 99 F (37.2 C) (04/02 1034) Pulse Rate:  [67-83] 83 (04/02 1034) Resp:  [16-18] 16 (04/02 1034) BP: (100-115)/(56-74) 112/63 mmHg (04/02 1034) SpO2:  [88 %-97 %] 91 % (04/02 1034) Last BM Date: 02/14/15  Intake/Output from previous day: 04/01 0701 - 04/02 0700 In: 120 [P.O.:120] Out: 1250 [Urine:1250] Intake/Output this shift: Total I/O In: -  Out: 500 [Urine:500]  Awake, sitting in chair; PT at beside IS about 1100 Coarse BS on Left Some chest wall tenderness  Lab Results:   Recent Labs  02/16/15 0454  WBC 13.4*  HGB 12.7*  HCT 38.6*  PLT 343   BMET  Recent Labs  02/16/15 0454  NA 136  K 4.1  CL 107  CO2 21  GLUCOSE 109*  BUN 8  CREATININE 1.16  CALCIUM 9.2   PT/INR No results for input(s): LABPROT, INR in the last 72 hours. ABG No results for input(s): PHART, HCO3 in the last 72 hours.  Invalid input(s): PCO2, PO2  Studies/Results: Dg Chest Port 1 View  02/17/2015   CLINICAL DATA:  Left side chest pain, hemo pneumothorax, rib fracture  EXAM: PORTABLE CHEST - 1 VIEW  COMPARISON:  02/16/2015  FINDINGS: Cardiomediastinal silhouette is stable. Less chest tube has been removed. Left rib fractures again noted. Stable left lower lobe atelectasis or lung contusion. No pneumothorax. Subcutaneous emphysema left lower chest wall again noted. Multiple calcified bilateral granulomas again noted.  IMPRESSION: Left chest tube has been removed. Stable left lower lobe atelectasis or lung contusion. No pneumothorax. Subcutaneous emphysema left lower chest wall, left rib fractures again noted.   Electronically Signed   By: Natasha MeadLiviu  Pop M.D.   On: 02/17/2015 08:01    Anti-infectives: Anti-infectives    None      Assessment/Plan: Fall Multiple left rib fxs w/PTX s/p CT -- Pulmonary toilet Multiple  medical problems -- Home meds FEN -- No issues VTE -- SCD's, Lovenox Dispo -- requiriing some O2 now. Will check CXR. Will need some more pulm toilet. Not ready today  Mary Sellaric M. Andrey CampanileWilson, MD, FACS General, Bariatric, & Minimally Invasive Surgery [facility] [place]    LOS: 3 days    Atilano InaWILSON,Cannan Beeck M 02/18/2015

## 2015-02-18 NOTE — Progress Notes (Signed)
Occupational Therapy Treatment Patient Details Name: Jason Patterson MRN: [id number] DOB: 1956/09/18 Today's Date: 02/18/2015    History of present illness 59 y.o. male s/p mechanical fall when a resident of the group home opened the front door hitting the pt and throwing him off balance and pt falling down 3 steps onto concrete with resulting in Multiple left rib fxs w/PTX, [facility] before pt [facility] 03/18/15. Chest tube D/C'd 02/16/2015. PMHx: Generalized anxiety disorder, Essential hypertension, Major depression, chronic   OT comments  Pt generally resistant to ADL training, but did ambulate to toilet and groomed at sink with supervision for safety.  Pt with 02 sat of 86% on RA, requiring return to 3L with rebound to 94%.  Instructed and performed breathing exercises with incentive spirometer x 3.  Pt resistant to coughing due to pain.  Instructed in splinting L side with pillow to reduce pain.  Follow Up Recommendations  No OT follow up    Equipment Recommendations  None recommended by OT    Recommendations for Other Services      Precautions / Restrictions Precautions Precautions: Fall Precaution Comments: watch 02 sats       Mobility Bed Mobility               General bed mobility comments: pt up in chair  Transfers Overall transfer level: Needs assistance Equipment used: None   Sit to Stand: Supervision         General transfer comment: supervision for safety. Did not require physical assist. Pt does not liked to be touched.    Balance                                   ADL Overall ADL's : Needs assistance/impaired     Grooming: Wash/dry hands;Set up;Standing           Upper Body Dressing : Set up;Standing       Toilet Transfer: Supervision/safety;Ambulation (stood at toilet to urinate)   Toileting- ArchitectClothing Manipulation and Hygiene: Supervision/safety;Sit to/from stand       Functional mobility during  ADLs: Supervision/safety (no device) General ADL Comments: Pt refusing bathing and dressing practice, generally resistant.      Vision                     Perception     Praxis      Cognition   Behavior During Therapy: Anxious;Flat affect Overall Cognitive Status: No family/caregiver present to determine baseline cognitive functioning                       Extremity/Trunk Assessment               Exercises     Shoulder Instructions       General Comments      Pertinent Vitals/ Pain       Pain Assessment: Faces Faces Pain Scale: Hurts even more Pain Location: L ribs Pain Descriptors / Indicators: Grimacing;Guarding Pain Intervention(s): Limited activity within patient's tolerance;Premedicated before session;Repositioned (instructed in using pillow to splint L side)  Home Living                                          Prior Functioning/Environment  Frequency Min 2X/week     Progress Toward Goals  OT Goals(current goals can now be found in the care plan section)  Progress towards OT goals: Progressing toward goals  Acute Rehab OT Goals Time For Goal Achievement: 02/24/15 Potential to Achieve Goals: Good  Plan Discharge plan remains appropriate    Co-evaluation                 End of Session Equipment Utilized During Treatment: Oxygen (3L)   Activity Tolerance Treatment limited secondary to medical complications (Comment) (desats to 86% on RA rapidly )   Patient Left in chair;with call bell/phone within reach   Nurse Communication          Time: 1610-9604 OT Time Calculation (min): 19 min  Charges: OT General Charges $OT Visit: 1 Procedure OT Treatments $Self Care/Home Management : 8-22 mins  Evern Bio 02/18/2015, 1:31 PM [phone]

## 2015-02-19 ENCOUNTER — Inpatient Hospital Stay (HOSPITAL_COMMUNITY): Payer: Medicaid Other

## 2015-02-19 NOTE — Clinical Social Work Note (Signed)
CSW spoke with patient's RN who states patient not ready for d/c. No further needs. CSW to follow tomorrow.  Sammuel Blick Patrick-Jefferson, LCSWA Weekend Clinical Social Worker [phone]

## 2015-02-19 NOTE — Progress Notes (Signed)
  Subjective: Alert. Says his left chest hurts. SPO2 96% on 3 L Chest x-ray shows tiny left pleural effusion without pneumothorax, slightly improved left airspace disease. No consolidation or pneumonia. Temp 101.2 yesterday, afebrile now  RN states that he is not cooperative. Refuses to do pulmonary physiotherapy's. Refuses to walk in hall.  [facility]. Has bipolar disorder, schizoaffective disorder, seizure disorder but none since age 59.  Objective: Vital signs in last 24 hours: Temp:  [99 F (37.2 C)-101.2 F (38.4 C)] 99.4 F (37.4 C) (04/03 0545) Pulse Rate:  [82-83] 83 (04/03 0545) Resp:  [16-17] 17 (04/03 0545) BP: (96-114)/(58-75) 114/73 mmHg (04/03 0545) SpO2:  [88 %-96 %] 93 % (04/03 0918) Last BM Date: 02/14/15  Intake/Output from previous day: 04/02 0701 - 04/03 0700 In: 362 [P.O.:362] Out: 1275 [Urine:1275] Intake/Output this shift:    General appearance: Awake. Sitting up in bed. Looks deconditioned. No acute distress. Resp: Coarse breath sounds bilaterally. Decreased breath sounds left base. Left chest wall tenderness noted. GI: soft, non-tender; bowel sounds normal; no masses,  no organomegaly  Lab Results:  No results for input(s): WBC, HGB, HCT, PLT in the last 72 hours. BMET No results for input(s): NA, K, CL, CO2, GLUCOSE, BUN, CREATININE, CALCIUM in the last 72 hours. PT/INR No results for input(s): LABPROT, INR in the last 72 hours. ABG No results for input(s): PHART, HCO3 in the last 72 hours.  Invalid input(s): PCO2, PO2  Studies/Results: Dg Chest Port 1 View  02/18/2015   CLINICAL DATA:  Followup of pneumothorax.  EXAM: PORTABLE CHEST - 1 VIEW  COMPARISON:  One day prior  FINDINGS: Patient rotated left. Mild cardiomegaly. Subcutaneous emphysema about the left chest wall is minimally decreased. Midline trachea. Small left pleural effusion is similar. No pneumothorax. Bibasilar airspace disease, minimally improved on  the left. Scattered calcified granulomas.  IMPRESSION: Persistent small left pleural effusion, without pneumothorax.  Similar right and improved left base airspace disease.  Decreased left subcutaneous emphysema.   Electronically Signed   By: Jeronimo GreavesKyle  Talbot M.D.   On: 02/18/2015 17:16    Anti-infectives: Anti-infectives    None      Assessment/Plan:  Fall Multiple left rib fxs w/PTX s/p CT -- Pulmonary toile, IS, ambulate q4h, flutter valve, nebs ordered. Not cooperative, however. Multiple medical problems -- Home meds FEN -- No issues VTE -- SCD's, Lovenox Dispo -- due to fever we will check 2 view chest x-ray today. Try to wean O2. Not ready for discharge due to pulmonary status.   LOS: 4 days    Orlie Cundari M 02/19/2015

## 2015-02-19 NOTE — Plan of Care (Signed)
Problem: Phase III Progression Outcomes Goal: Activity at appropriate level-compared to baseline (UP IN CHAIR FOR HEMODIALYSIS)  Outcome: Not Progressing Patient refuses to ambulate, take bath or get out of bed.

## 2015-02-20 MED ORDER — OXYCODONE HCL 5 MG PO TABS: 5.0000 mg | ORAL_TABLET | Freq: Four times a day (QID) | ORAL | Status: DC | PRN

## 2015-02-20 NOTE — Discharge Instructions (Signed)
You may shower and apply a bandaid to the left chest/previous chest tube site.  .Pneumothorax A pneumothorax, commonly called a collapsed lung, is a condition in which air leaks from a lung and builds up in the space between the lung and the chest wall (pleural space). The air in a pneumothorax is trapped outside the lung and takes up space, preventing the lung from fully expanding. This is a condition that usually occurs suddenly. The buildup of air may be small or large. A small pneumothorax may go away on its own. When a pneumothorax is larger, it will often require medical treatment and hospitalization.  CAUSES  A pneumothorax can sometimes happen quickly with no apparent cause. People with underlying lung problems, particularly COPD or emphysema, are at higher risk of pneumothorax. However, pneumothorax can happen quickly even in people with no prior known lung problems. Trauma, surgery, medical procedures, or injury to the chest wall can also cause a pneumothorax. SIGNS AND SYMPTOMS  Sometimes a pneumothorax will have no symptoms. When symptoms are present, they can include:  Chest pain.  Shortness of breath.  Increased rate of breathing.  Bluish color to your lips or skin (cyanosis). DIAGNOSIS  Pneumothorax is usually diagnosed by a chest X-ray or chest CT scan. Your health care provider will also take a medical history and perform a physical exam to determine why you may have a pneumothorax. TREATMENT  A small pneumothorax may go away on its own without treatment. Extra oxygen can sometimes help a small pneumothorax go away more quickly. For a larger pneumothorax or a pneumothorax that is causing symptoms, a procedure is usually needed to drain the air.In some cases, the health care provider may drain the air using a needle. In other cases, a chest tube may be inserted into the pleural space. A chest tube is a small tube placed between the ribs and into the pleural space. This removes the  extra air and allows the lung to expand back to its normal size. A large pneumothorax will usually require a hospital stay. If there is ongoing air leakage into the pleural space, then the chest tube may need to remain in place for several days until the air leak has healed. In some cases, surgery may be needed.  HOME CARE INSTRUCTIONS   Only take over-the-counter or prescription medicines as directed by your health care provider.  If a cough or pain makes it difficult for you to sleep at night, try sleeping in a semi-upright position in a recliner or by using 2 or 3 pillows.  Rest and limit activity as directed by your health care provider.  If you had a chest tube and it was removed, ask your health care provider when it is okay to remove the dressing. Until your health care provider says you can remove the dressing, do not allow it to get wet.  Do not smoke. Smoking is a risk factor for pneumothorax.  Do not fly in an airplane or scuba dive until your health care provider says it is okay.  Follow up with your health care provider as directed. SEEK IMMEDIATE MEDICAL CARE IF:   You have increasing chest pain or shortness of breath.  You have a cough that is not controlled with suppressants.  You begin coughing up blood.  You have pain that is getting worse or is not controlled with medicines.  You cough up thick, discolored mucus (sputum) that is yellow to green in color.  You have redness, increasing  pain, or discharge at the site where a chest tube had been in place (if your pneumothorax was treated with a chest tube).  The site where your chest tube was located opens up.  You feel air coming out of the site where the chest tube was placed.  You have a fever or persistent symptoms for more than 2-3 days.  You have a fever and your symptoms suddenly get worse. MAKE SURE YOU:   Understand these instructions.  Will watch your condition.  Will get help right away if you are  not doing well or get worse. Document Released: 11/04/2005 Document Revised: 08/25/2013 Document Reviewed: 06/03/2013 [facility] Patient Information 2015 [employer] [place]. This information is not intended to replace advice given to you by your health care provider. Make sure you discuss any questions you have with your health care provider.

## 2015-02-20 NOTE — Progress Notes (Signed)
Patient seems to be very upset, asked if I could help him in any way. Refused my help, and all treatments at this time. MD was in room and asked me to take off O2, stated that SAT was 96%. RT will monitor as needed.

## 2015-02-20 NOTE — Progress Notes (Signed)
CSW contacted pt's guardian re: transportation for pt.  Explained that pt's RH could not come and get pt and that he didn't want to go by ambulance.  Guardian unable to provide transport and advised he be sent back by [facility].  Pt informed and agreeable to plan.

## 2015-02-20 NOTE — Progress Notes (Signed)
Physical Therapy Treatment Patient Details Name: Jason Patterson MRN: [id number] DOB: [birth year] Today's Date: 02/20/2015    History of Present Illness 59 y.o. male s/p mechanical fall when a resident of the group home opened the front door hitting the pt and throwing him off balance and pt falling down 3 steps onto concrete with resulting in Multiple left rib fxs w/PTX, [facility]. Chest tube D/C'd 02/16/2015. PMHx: Generalized anxiety disorder, Essential hypertension, Major depression    PT Comments    Pt extremely particular and will only participate in therapy on his terms. Pt is unsteady and would benefit from use of RW and HHPT for balance training and strengthening however pt strongly refuses both recommendations. During amb pt SPO2 between 89-91% on RA. Pt is at increased fall risk and will need 24/7 supervision/assist prior to d/c home.   Follow Up Recommendations  Supervision/Assistance - 24 hour;Home health PT (pt refused HHPT j)     Equipment Recommendations  Rolling walker with 5" wheels (however pt refuses)    Recommendations for Other Services       Precautions / Restrictions Precautions Precautions: Fall Restrictions Weight Bearing Restrictions: No    Mobility  Bed Mobility Overal bed mobility: Modified Independent             General bed mobility comments: increased time, use of bed rail  Transfers Overall transfer level: Needs assistance Equipment used: None Transfers: Sit to/from Stand Sit to Stand: Supervision         General transfer comment: pt refused any assist used UE to push self up  Ambulation/Gait Ambulation/Gait assistance: Min guard Ambulation Distance (Feet): 150 Feet Assistive device: None Gait Pattern/deviations: Step-through pattern;Decreased stride length;Shuffle;Trunk flexed Gait velocity: decreased   General Gait Details: pt refused to use RW but desired to push dynamap part  of the way. pt regarded 1 seated rest break. Pt with short,shuffled steps and noted instability. Pt given v/c's for upright posture and to look ahead.   Stairs            Wheelchair Mobility    Modified Rankin (Stroke Patients Only)       Balance Overall balance assessment: Needs assistance Sitting-balance support: No upper extremity supported Sitting balance-Leahy Scale: Good     Standing balance support: Single extremity supported Standing balance-Leahy Scale: Fair                      Cognition Arousal/Alertness: Awake/alert Behavior During Therapy: Flat affect;Agitated Overall Cognitive Status: No family/caregiver present to determine baseline cognitive functioning                      Exercises      General Comments General comments (skin integrity, edema, etc.): pt sat EOB to use urinal.      Pertinent Vitals/Pain Pain Assessment: 0-10 Pain Score:  (unable to rate) Faces Pain Scale: Hurts little more Pain Location: L ribs during transfer out of bed Pain Descriptors / Indicators: Grimacing;Guarding Pain Intervention(s): Limited activity within patient's tolerance    Home Living                      Prior Function            PT Goals (current goals can now be found in the care plan section) Progress towards PT goals: Progressing toward goals    Frequency  Min 3X/week  PT Plan Current plan remains appropriate    Co-evaluation             End of Session   Activity Tolerance: Patient limited by fatigue Patient left: in bed;with call bell/phone within reach;with bed alarm set     Time: 1128-1209 PT Time Calculation (min) (ACUTE ONLY): 41 min  Charges:  $Gait Training: 23-37 mins $Therapeutic Activity: 8-22 mins                    G Codes:      Marcene Brawn 02/20/2015, 1:38 PM   Lewis Shock, PT, DPT Pager #: [phone] Office #: [phone]

## 2015-02-20 NOTE — Progress Notes (Signed)
Occupational Therapy Treatment Patient Details Name: Jason Patterson MRN: [id number] DOB: 11/01/1956 Today's Date: 02/20/2015    History of present illness 59 y.o. male s/p mechanical fall when a resident of the group home opened the front door hitting the pt and throwing him off balance and pt falling down 3 steps onto concrete with resulting in Multiple left rib fxs w/PTX, CT placed at [facility] before pt trasferred to [facility] 03/18/15. Chest tube D/C'd 02/16/2015. PMHx: Generalized anxiety disorder, Essential hypertension, Major depression   OT comments  This 59 yo is self limiting--only wants to do what he wants to do and the way he wants to do it. Would benefit from an AD for ambulation but is resistant to them.  He at this point is unsafe to be up on his feet--at least by what I have seen. Feel he really needs follow up OT at SNF and if not agreeable then HHOT--if he would cooperate and Medicaid would pay for it--but I do not see either happening.  Follow Up Recommendations   (SNF would be 1st recommendation, if not agreeable (which I suspect then HHOT)--however I do not think pt would be very agreeable to work with OT at either place and pt has Medicaid which means he will not even be able to get OT at either place.)    Equipment Recommendations  None recommended by OT       Precautions / Restrictions Precautions Precautions: Fall Precaution Comments: watch 02 sats; CT site leaking this AM (completely saturated bandage--pt had also urinated, so hard to say how much was saturation from wound and how much urine; however it was alot)--nursing aware Restrictions Weight Bearing Restrictions: No       Mobility Bed Mobility Overal bed mobility: Modified Independent             General bed mobility comments: increased time , HOB up, no rails  Transfers Overall transfer level: Needs assistance Equipment used: None Transfers: Sit to/from [employer] Sit to Stand:  Supervision   Squat pivot transfers: Min guard (pt very tentative with getting from bed to recliner 3 feet away (would not use RW and would not let me touch him--even try to get his gown from behind him before he sat down so it wouldnt get the chair pad soaked. He "furniture walked" from  bed to recliner in a hunched over position--he made it there, but safely not the best.          Balance Overall balance assessment: Needs assistance;History of Falls Sitting-balance support: No upper extremity supported;Feet supported Sitting balance-Leahy Scale: Good     Standing balance support: Bilateral upper extremity supported Standing balance-Leahy Scale: Fair (could maintain balance while weight shifting since he was holding onto furniture as he stepped to recliner)                     ADL                                         General ADL Comments: Could not get pt to wash up or take a shower even though he was completely soaked with urine and drainage from bandage; said he took one last night-shower (and he had)--said he would take a wash up again tonight despite reaking of urine.  Cognition   Behavior During Therapy: Flat affect;Agitated (agitated due to us "pushing" him to take a shower even though he just had one last night) Overall Cognitive Status: No family/caregiver present to determine baseline cognitive functioning                                    Pertinent Vitals/ Pain       Pain Assessment: No/denies pain         Frequency Min 2X/week     Progress Toward Goals  OT Goals(current goals can now be found in the care plan section)  Progress towards OT goals: Not progressing toward goals - comment (due to he only wants to do what he wants to do)     Plan Discharge plan needs to be updated       End of Session Equipment Utilized During Treatment:  (None, O2 tubing was up on wall and turned off when I entered)    Activity Tolerance Treatment limited secondary to agitation (limited by doing only what he wants to do)   Patient Left in chair;with call bell/phone within reach (nursing had ordered alarm box, said it was OK to go ahead and get him up)   Nurse Communication Mobility status (left chest tube wound leaking)        Time: [id number] OT Time Calculation (min): 23 min  Charges: OT General Charges $OT Visit: 1 Procedure OT Treatments $Self Care/Home Management : 23-37 mins  Evette GeorgesLeonard, Susette Seminara Eva [phone] 02/20/2015, 9:24 AM

## 2015-02-20 NOTE — Progress Notes (Signed)
O2 sat stays between 89 and 91% on room air while walking in hall with PT.
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
## 2015-02-20 NOTE — Progress Notes (Signed)
Patient ID: Jason Patterson, male   DOB: January 06, 1956, 59 y.o.   MRN: [id number]  LOS: 5 days   Subjective: Pt off 02, i personally checked it and he was 96% and therefore removed.  Will check an ambulating 02 sat to ensure he does not desaturate with mobility.  VSS.  No further fevers.   Objective: Vital signs in last 24 hours: Temp:  [98.9 F (37.2 C)-99.5 F (37.5 C)] 99.5 F (37.5 C) (04/04 0605) Pulse Rate:  [74-83] 83 (04/04 0605) Resp:  [17-18] 17 (04/04 0605) BP: (109-120)/(58-72) 116/63 mmHg (04/04 0605) SpO2:  [92 %-96 %] 92 % (04/04 0605) Last BM Date: 02/14/15  Lab Results:  CBC No results for input(s): WBC, HGB, HCT, PLT in the last 72 hours. BMET No results for input(s): NA, K, CL, CO2, GLUCOSE, BUN, CREATININE, CALCIUM in the last 72 hours.  Imaging: Dg Chest 2 View  02/19/2015   CLINICAL DATA:  Shortness of breath. Fever. Left-sided chest pain. Left rib fractures.  EXAM: CHEST  2 VIEW  COMPARISON:  02/18/2015  FINDINGS: Subcutaneous emphysema again seen in the left chest wall, however no definite pneumothorax visualized. Multiple mildly displaced left-sided rib fractures are again demonstrated.  Left lower lobe infiltrate or atelectasis shows no significant change. Decreased atelectasis seen in right lung base since prior exam. Heart size remains stable.  IMPRESSION: No significant change in left lower lobe infiltrate versus atelectasis. No pneumothorax identified.  Multiple left rib fractures and left chest wall subcutaneous emphysema again noted.  Decreased right basilar atelectasis.   Electronically Signed   By: Myles Rosenthal M.D.   On: 02/19/2015 14:11   Dg Chest Port 1 View  02/18/2015   CLINICAL DATA:  Followup of pneumothorax.  EXAM: PORTABLE CHEST - 1 VIEW  COMPARISON:  One day prior  FINDINGS: Patient rotated left. Mild cardiomegaly. Subcutaneous emphysema about the left chest wall is minimally decreased. Midline trachea. Small left pleural effusion is similar. No  pneumothorax. Bibasilar airspace disease, minimally improved on the left. Scattered calcified granulomas.  IMPRESSION: Persistent small left pleural effusion, without pneumothorax.  Similar right and improved left base airspace disease.  Decreased left subcutaneous emphysema.   Electronically Signed   By: Jeronimo Greaves M.D.   On: 02/18/2015 17:16     PE: General appearance: alert and uncooperative Lungs.  Cta.  Cardio: regular rate and rhythm, S1, S2 normal, no murmur, click, rub or gallop GI: soft, non-tender; bowel sounds normal; no masses,  no organomegaly     Patient Active Problem List   Diagnosis Date Noted  . Fall 02/16/2015  . Traumatic pneumothorax 02/15/2015  . Rib fractures 02/15/2015  . Sepsis due to urinary tract infection 02/03/2015  . Altered mental status 02/03/2015  . Sepsis 02/03/2015  . Protein-calorie malnutrition, severe 02/03/2015  . UTI (lower urinary tract infection)   . Ulnar neuropathy at elbow of left upper extremity 06/23/2014  . Schizoaffective disorder 05/02/2014  . Chest pain 05/01/2014  . Major depression, chronic 04/26/2014  . Schizoaffective disorder, bipolar type 04/26/2014  . Schizoaffective disorder, unspecified type 05/01/2009  . Bipolar disorder 05/01/2009  . Generalized anxiety disorder 05/01/2009  . Essential hypertension, benign 05/01/2009  . RIGHT BUNDLE BRANCH BLOCK 05/01/2009  . COPD 05/01/2009  . GERD 05/01/2009  . PSORIASIS 05/01/2009  . SEIZURE DISORDER 05/01/2009  . CHEST PAIN UNSPECIFIED 05/01/2009      Assessment/Plan: Fall Multiple left rib fxs w/PTX s/p CT -- Pulmonary toile, IS, ambulate q4h, flutter valve.  DC 02, 96% RA.  Check ambulating 02, but not sure if he will cooperate.  Pt told me to leave the room and preceded to call "head of the hospital" with his call bell.   Multiple medical problems -- Home meds FEN -- No issues VTE -- SCD's, Lovenox Dispo -- DC back to group home pending ambulating 02 sat.     Ashok NorrisEmina Nekeya Briski, ANP-BC Pager: [phone] General Trauma PA Pager: [phone]   02/20/2015 8:26 AM

## 2015-03-12 NOTE — Consult Note (Signed)
Brief Consult Note: Diagnosis: Schizoaffective disorder bipolar type.   Patient was seen by consultant.   Orders entered.   Discussed with Attending MD.   Comments: Will admit to BMU for further stabilization and placement.  Electronic Signatures: Kristine LineaPucilowska, Jolanta (MD)  (Signed 11-Jun-13 11:00)  Authored: Brief Consult Note   Last Updated: 11-Jun-13 11:00 by Kristine LineaPucilowska, Jolanta (MD)

## 2015-03-12 NOTE — Discharge Summary (Signed)
PATIENT NAME:  Jason Patterson, Jason Patterson MR#:  [id number] DATE OF BIRTH:  [birth year]  DATE OF ADMISSION:  04/28/2012 DATE OF DISCHARGE:  05/08/2012  HOSPITAL COURSE: See dictated history and physical for details of admission. This 59 year old man with schizophrenia with a long history of impulsive threatening behavior which has resulted in multiple hospitalizations was brought to the hospital after making threatening dangerous remarks to people at his last group home. This is a typical behavior pattern for him. He presented somewhat agitated, but has calmed down pretty quickly. In the hospital, he has not engaged in any dangerous or aggressive behavior. Denies suicidal ideation. He has been treated primarily with the medications that he had already been on previously with minimal adjustment. He was engaged in groups and supportive therapy. Behavior was monitored. He is showing a calm affect, generally lucid thinking, not acting on internal stimuli, not threatening. He shows improved insight into his behavior. As usual he understands after the fact that his temper and behavior were inappropriate and feels remorseful for it. Because of this repeated pattern, there was some difficulty finding a group home that would except him, but at this point we have found a facility and he is agreeable to it. During his hospital stay, he had an episode of chest pain and a Medicine consult was ordered. A rather complete work-up was done which showed no evidence of cardiac related disease. At this point, he is feeling fine, no new medical complaints.   DISCHARGE MEDICATIONS:  1. Nitroglycerin 0.4 mg sublingual p.r.n. for chest pain. 2. Albuterol 1 to 2 puffs every 4 to 6 hours p.r.n. for shortness of breath. 3. Aspirin 81 mg per day.  4. Prilosec 40 mg per day.  5. Citalopram 20 mg per day.  6. Trazodone 50 mg at night.  7. Risperdal 2 mg at night.  8. Lithium 600 mg twice a day.   LABORATORY, DIAGNOSTIC AND RADIOLOGIC DATA:  Admission labs showed chemistry with a sodium slightly low at 134. Otherwise chemistries were all normal. Admission CK-MB was 1.3, which is in the normal range. Total CK was 79, which is in the normal range. Magnesium was normal.   Chest x-ray was performed which showed some calcified scattered nodules but no other acute disease.   EKG showed normal sinus rhythm with some left axis deviation.   A cardiac work-up showed a normal d-dimer, normal CK-MB. Cholesterol was 147, triglycerides 223, HDL 39, VLDL 45, and LDL 65.   A treadmill test was performed, which appears to have been read as not indicative of cardiac disease.   A gated nuclear medicine scan was also performed which was read as normal, no sign of acute cardiac disease.   MENTAL STATUS EXAM AT DISCHARGE: Somewhat disheveled, older man who looks older than his stated age. Eye contact good. Psychomotor activity is reserved. Speech is quiet but easy to understand, decreased in total amount. Affect is constricted but smiling and pleasant. Mood is stated as good. Thoughts appear simple, slow and concrete but not grossly bizarre. No delusional or paranoid thoughts evident. Denies suicidal or homicidal ideation. Denies hallucinations. Shows improved insight and judgment. He is alert and oriented x4. He is cognitively slow but short term memory is generally intact.   DISPOSITION: He is to be discharged to a group home tomorrow and continue on current medications as prescribed. He already has ACT Team support in place.   DIAGNOSIS PRINCIPLE AND PRIMARY:   AXIS I: Schizophrenia, undifferentiated.   SECONDARY DIAGNOSES:  AXIS II: Deferred.   AXIS III: Gastroesophageal reflux disease, chronic obstructive pulmonary disease, rule out cardiac disease.      AXIS IV: Moderate to severe from chronic mental illness, difficulty finding housing, poor coping skills.   AXIS V: Functioning at time of discharge 54.   ____________________________ Audery AmelJohn T. Clapacs, MD jtc:[id number] D: 05/07/2012 17:10:00 ET T: 05/08/2012 11:21:00 ET JOB#: [id number]  cc: Audery AmelJohn T. Clapacs, MD, <Dictator> Audery AmelJOHN T CLAPACS MD ELECTRONICALLY SIGNED 05/08/2012 13:27
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
## 2015-03-12 NOTE — Consult Note (Signed)
PATIENT NAME:  Jason Patterson, Jason Patterson MR#:  [id number] DATE OF BIRTH:  May 06, 1956  DATE OF CONSULTATION:  05/03/2012  REFERRING PHYSICIAN:  Cephus Shelling, MD CONSULTING PHYSICIAN:  Romie Jumper, MD  PRIMARY CARE PHYSICIAN: Lorelee Market, MD  REASON FOR CONSULTATION: Chest pain.   HISTORY OF PRESENT ILLNESS: The patient is a 59 year old male with past medical history of chronic obstructive pulmonary disease, GERD, and schizoaffective disorder with history of psychosis and multiple psych admissions who was admitted under the psych service on the day mentioned above for homicidal thoughts and also for stabilization of his psychosis and schizoaffective disorder. Last night he developed chest pain for which medical consultation was requested. He states that while he was at rest he developed sudden onset chest pain, in the left substernal region, which was sharp in nature and radiating to the left shoulder and lasted for approximately nine hours and eased off and eventually resolved when he went to bed. There was no associated shortness of breath, nausea, vomiting, dizziness, or diaphoresis. When he awoke this morning he had chest pain again located in a similar spot and radiating to the left arm and shoulder region which was not exacerbated by any factors. He did not receive any medications for this condition and stated that his chest pain lasted about 8 to 9 hours again and thereafter had spontaneously resolved. He currently states that he feels well and he is without any specific complaints at this time. He denies any active chest pain at this time. He currently denies any suicidal or homicidal ideation. EKG has been obtained revealing normal sinus rhythm, heart rate 60 beats per minute, with no acute ST or T wave changes, and a set of cardiac enzymes was obtained and within normal limits. Otherwise, the patient is without specific complaints at this time.   PAST SURGICAL HISTORY:  1. 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 2. Bilateral lens implants.  3. Rotator cuff surgery on the right.   PAST MEDICAL HISTORY:  1. Gastroesophageal reflux disease. 2. Chronic obstructive pulmonary disease.  3. Schizoaffective disorder versus schizophrenia. 4. History of bipolar disorder. 5. Tobacco abuse.  6. Possible asthma.   ALLERGIES: Depakote, fish oil, Paxil, and penicillin.   HOME MEDICATIONS: 1. Aspirin 81 mg p.o. daily.  2. Celexa 20 mg daily. 3. Lithium carbonate 600 mg twice a day. 4. Clonazepam 1 mg twice a day. 5. Trazodone 50 mg at night.  6. ProAir inhaler p.r.n.  7. Omeprazole 40 mg daily. 8. Risperdal 2 mg at night.   CURRENT INPATIENT MEDICATIONS:  1. Tylenol p.r.n.  2. Antacid double-strength suspension p.r.n.  3. Aspirin 81 mg daily. 4. Celexa 20 mg daily. 5. Lithium 600 mg twice a day. 6. Milk of Magnesia p.r.n.  7. Nicotrol oral inhaler kit one cartridge inhaled every 2 hours p.r.n.  8. Nicotine patch 14 mg transdermally daily.  9. Omeprazole 40 mg at 6 a.m. 10. Risperdal 2 mg at bedtime.  11. Trazodone 50 mg at bedtime.   FAMILY HISTORY: The patient states that both his parents died of old age and there is no family history of coronary artery disease or myocardial infarction or any heart disease.   SOCIAL HISTORY: Tobacco - ongoing, smokes 1/2 pack per day and has been smoking for over 40 years. Alcohol - none. Illicit drugs -  none.  REVIEW OF SYSTEMS: CONSTITUTIONAL: Denies fevers, chills, recent changes in weight or weakness. HEAD/EYES: Denies headache or blurry  vision. ENT: Denies tinnitus, earache, nasal discharge, or sore throat. RESPIRATORY: Denies shortness breath, cough, or wheezing. CARDIOVASCULAR: Had some chest pain earlier which has since resolved. Denies orthopnea or lower extremity edema. GASTROINTESTINAL: Denies nausea, vomiting, diarrhea, constipation, melena, hematochezia, or abdominal pain.  GENITOURINARY: Denies  dysuria or hematuria. ENDOCRINE: Denies heat or cold intolerance. HEME/LYMPH: Denies easy bruising or bleeding. INTEGUMENTARY: Denies rash or lesions. MUSCULOSKELETAL: Denies joint pain or muscle weakness.  NEUROLOGIC: Denies headache, numbness, weakness, tingling, or dysarthria. PSYCHIATRIC: States he has underlying depression and anxiety, but currently denies suicidal or homicidal ideation.   PHYSICAL EXAMINATION:   VITAL SIGNS: Temperature 98.1, pulse 79, blood pressure 124/75, respirations 18, and oxygen saturation 97% on room air.   GENERAL: The patient is alert and oriented, not in acute distress.   HEENT: Normocephalic, atraumatic. Pupils are equal, round and reactive to light accommodation. Extraocular muscles are intact. Anicteric sclerae. Conjunctivae pink. Hearing intact to voice. Nares without drainage. Oral mucosa moist without lesions.   NECK: Supple with full range of motion. No jugular venous distention, lymphadenopathy, or carotid bruits bilaterally. No thyromegaly or tenderness to palpation over the thyroid gland.   CHEST: Normal respiratory effort without use of accessory respiratory muscles. Lungs are clear to auscultation bilaterally without crackles, rales, or wheezing.   CARDIOVASCULAR: S1 and S2 positive. Regular rate and rhythm. No murmurs, rubs, or gallops. PMI is non-lateralized. No tenderness to palpation over anterior chest wall.   ABDOMEN: Soft, nontender, and nondistended. Normoactive bowel sounds. No hepatosplenomegaly or palpable masses. No hernias.   EXTREMITIES: No clubbing, cyanosis, or edema. Pedal pulses are palpable bilaterally.   SKIN: No suspicious rashes. Skin turgor is good.   LYMPH: No cervical lymphadenopathy.   NEURO: Alert and oriented x3. Cranial nerves II through XII are grossly intact. No focal deficits.   PSYCH: Appropriate affect.  LABORATORY, DIAGNOSTIC AND RADIOLOGIC DATA: CMP from today was within normal limits, except for serum  sodium slightly low at 134.  Cardiac enzymes - CK 79 and CK-MB is 1.3. Troponin is less than 0.02.   TSH 0.62.   Serum lithium level was 0.65 on admission.  Urine drug screen was negative on admission.   CBC on admission: WBC 6.7, hemoglobin 13.4, hematocrit 38.3, and platelets 219.   D-dimer 0.37.  EKG: Normal sinus rhythm, heart rate 60 beats per minute with left axis deviation, normal intervals, no acute ST or T wave changes.   No prior EKGs for comparison.   Chest x-ray, PA and lateral: Mildly increased interstitial lung markings of both lungs which likely reflect underlying fibrotic change. There are scattered nodules measuring between 2 to 4 mm in both lungs. These appear to be faintly calcified and may reflect sequela of previous granulomatous infection. No evidence of congestive heart failure or pleural effusion.   ASSESSMENT AND PLAN: A 58 year old male with past medical history of GERD, COPD, and ongoing tobacco abuse as well as multiple psychiatric admissions with history of bipolar disorder and schizoaffective disorder versus schizophrenia who was admitted under the psychiatric service for homicidal thoughts and for stabilization of his schizoaffective disorder with: 1. Chest pain - the exact etiology of the patient's chest pain is unclear at this time, but appears to be noncardiac given lack of any ST or T wave changes on his EKG and also initial cardiac enzymes being negative. Chest pain could be musculoskeletal in nature; however, there was no tenderness to palpation on exam. He has not experienced any trauma recently or lifted  any heavy objects. Chest pain could also be gastroesophageal reflux disease related and related to acid reflux, but he is already on PPI therapy. Therefore, the exact etiology of his chest pain is unclear at this time. However, he is currently chest pain free at the time of my evaluation and states he feels "pretty good" currently. I do however feel that he  would benefit from an outpatient Myoview and this can be arranged by his primary care physician. In regards to scattered nodules noted on his chest x-ray, recommend outpatient chest CT for further assessment and serial imaging studies which can be performed on an outpatient basis to ensure stability of these nodules and also to make sure the nodules are not enlarging and to evaluate for possible malignancy, especially given his history of tobacco abuse. Outpatient chest CT can be performed and ordered by the patient's primary care physician.  2. Chronic obstructive pulmonary disease - stable and without acute exacerbation. Agree with albuterol metered dose inhaler as needed.  3. Gastroesophageal reflux disease - agree with PPI therapy.  4. Schizoaffective disorder and history of bipolar disorder with homicidal thoughts - currently being managed by the psychiatric service.  5. Deep vein thrombosis prophylaxis - the patient is ambulatory.  6. Tobacco abuse - the patient was strongly counseled on the importance of smoking cessation. Approximately three minutes were spent in doing so. Continue Nicotrol inhaler as well as nicotine patch, as per patient's request and desire.  7. DVT prophylaxis - the patient is ambulatory.  CODE STATUS: FULL CODE.  TIME SPENT ON CONSULTATION: Approximately 45 minutes. ____________________________ Romie Jumper, MD knl:slb D: 05/03/2012 19:00:28 ET T: 05/04/2012 09:04:57 ET JOB#: [id number]  cc: Romie Jumper, MD, <Dictator> Aarti K. Nicolasa Ducking, MD [place] A. Brunetta Genera, MD Romie Jumper MD ELECTRONICALLY SIGNED 05/19/2012 1:29

## 2015-03-12 NOTE — Consult Note (Signed)
Brief Consult Note: Diagnosis: chest pain.   Patient was seen by consultant.   Consult note dictated.   Recommend further assessment or treatment.   Orders entered.   Comments: full note dictated  *CP - exact etiology unclear.  appears atypical in regards to cardiac-related pain given onset/duration/characterization.  only known RF for CAD = tob. abuse.   will cycle CE's.  he's currently CP free.  can give prn nitrate if CP recurs.   no acute ST or T wave changes on EKG, 1st set CE's neg.  r/o MI and cont to cycle CE's.  if he rules out recommend outpatient myoview which can be arranged by his PCP.  in the meanwhile cont. ASA.  clinically doubt PE, but will check d-dimer.   thanks for the consult, will follow.  Electronic Signatures: Camillo FlamingLateef, Kinneth Fujiwara (MD)  (Signed 16-Jun-13 19:03)  Authored: Brief Consult Note   Last Updated: 16-Jun-13 19:03 by Camillo FlamingLateef, Sharlena Kristensen (MD)

## 2015-03-12 NOTE — H&P (Signed)
PATIENT NAME:  Jason Patterson, Jason Patterson MR#:  [id number] DATE OF BIRTH:  [birth year]  DATE OF ADMISSION:  04/28/2012  REFERRING PHYSICIAN: Janalyn Harder, MD   ATTENDING PHYSICIAN: Kristine Linea, MD  IDENTIFYING DATA: Jason Patterson is a 59 year old male with history of schizoaffective disorder.   CHIEF COMPLAINT: "I was mad."  HISTORY OF PRESENT ILLNESS: Jason Patterson was brought to the emergency room by the police from his group home. He was very upset and was threatening to hurt the owner, residents at the group home, and to burn the place down. He also threatened to follow the owner to her house.  He became very upset when he realized that the owner would not give him 3000 dollars that he expected to be waiting for him. The patient just spent 90 days in jail. He calculated that in three months the group home owner accumulated his disability money and wanted to have it and that is why he was arguing and threatening the group home owner. Now he is cool and collected. He admits that he has a guardian and no financial dealings or discharge plans will be made without the guardian's involvement. He is apologetic and understands that his behavior was out of control. It resulted in need for new placement. Even though the group home owner made a deal with the guardian to keep the patient for one month following discharge from jail, she does not want to do it and we do not think that this would be appropriate. The patient is rather optimistic about the future and already has made some connections trying to find himself a place. He is uncertain what he would use his money for if he had access. He does say that he would prefer to live in a boarding house because he calculated it would leave more money for him to keep that he would like to spend on cigarettes and orange juice. He reports that he is from [place] and drinking orange juice is very important to him. He denies alcohol or illicit substance use. He was  apparently compliant with his treatment in jail as his lithium level was therapeutic. We did get in touch with his ACT team and they will be helping [place] placing this unfortunate patient.   PAST PSYCHIATRIC HISTORY: There are multiple, multiple hospitalizations at [facility], [facility], [facility] and here. The patient has been tried on numerous medications. Recently minor adjustments in the dose of Klonopin were made but the patient appears stable on his regimen except when upset. The patient was given the diagnosis of bipolar disorder and schizophrenia in the past   FAMILY PSYCHIATRIC HISTORY: Unknown   PAST MEDICAL HISTORY:  1. Chronic obstructive pulmonary. 2. Gastroesophageal reflux disease.   ALLERGIES: Depakote, fish oil, Paxil, and penicillin.   MEDICATIONS ON ADMISSION: (Per his pharmacy record) 1. Risperdal 2 mg at night.  2. Omeprazole 40 mg daily. 3. ProAir inhaler as needed daily.  4. Trazodone 50 mg at night.  5. Clonazepam 1 mg twice daily.  6. Lithium carbonate 600 mg twice daily.  7. Celexa 20 mg daily.  8. Aspirin 81 mg daily.   SOCIAL HISTORY: He used to live at the [facility] He is not allowed to return there. We will look for another facility for him. 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 He does not drink alcohol. He smokes cigarettes.  REVIEW OF SYSTEMS:  CONSTITUTIONAL: No fevers or chills. No weight changes. EYES: No double or blurred vision. ENT: No hearing loss. RESPIRATORY: No shortness of breath or cough. CARDIOVASCULAR: No chest pain or orthopnea. GASTROINTESTINAL: No abdominal pain, nausea, vomiting, or diarrhea. Normal bowel movements. GU: No incontinence or frequency. ENDOCRINE: No heat or cold intolerance. LYMPHATIC: No anemia or easy bruising. INTEGUMENTARY: No acne or rash. MUSCULOSKELETAL: No muscle or joint pain. NEUROLOGIC: No tingling or weakness.  PSYCHIATRIC: See history of present illness for details.   PHYSICAL EXAMINATION:   VITAL SIGNS: Blood pressure 122/96, pulse 55, respirations 20, and temperature 97.4.   GENERAL: This is a well-developed male looking his stated age, in no acute distress.   HEENT: The pupils are equal, round, and reactive to light. Sclerae anicteric.   NECK: Supple. No thyromegaly.   LUNGS: Clear to auscultation. No dullness to percussion.   HEART: Regular rhythm and rate. No murmurs, rubs, or gallops.   ABDOMEN: Soft, nontender, and nondistended. Positive bowel sounds.   MUSCULOSKELETAL: Normal muscle strength in all extremities.   SKIN: No rashes or bruises.   LYMPHATIC: No cervical adenopathy.   NEUROLOGIC: Cranial nerves II through XII are intact.  LABORATORY DATA: Chemistries are within normal limits, except for sodium of 134. Blood alcohol level is zero. LFTs are within normal limits. TSH 0.62. Lithium level 0.65. Urine tox screen negative for substances. CBC within normal limits.   Urinalysis is not suggestive of urinary tract infection.   MENTAL STATUS EXAMINATION ON ADMISSION: The patient is examined in the emergency room. He is pleasant, polite, and cooperative. He is alert and oriented to person, place, time, and situation even though he gives me a little different spin than the group home owner. He is adequately groomed. He wears hospital scrubs. He maintains good eye contact. His speech is of normal rhythm, rate, and volume. His mood is fine now. He is affect is full. Thought processing is logical and goal oriented. Thought content - he denies suicidal or homicidal ideation but was admitted after making homicidal threats to staff and peers at the group home. There are no auditory or visual hallucinations. The patient appears grandiose and delusional. His cognition is grossly intact. He registers three out of three and recalls three out of three objects after 5 minutes. He can spell world  forwards and backwards. He can name four past presidents. His insight and judgment are questionable.   SUICIDE RISK ASSESSMENT ON ADMISSION: This is a patient with long-standing history of mood problems, psychosis, and self-injurious behavior who was brought to the hospital after making homicidal threats.   DIAGNOSES:   AXIS I: Schizoaffective disorder depressed with psychotic features.   AXIS II: Deferred.   AXIS III: Gastroesophageal reflux disease and chronic obstructive pulmonary disease.   AXIS IV: Mental illness, housing, primary support, and poor coping skills.   AXIS V: GAF on admission 25.   PLAN: The patient was admitted to [facility] Behavioral Medicine unit for safety, stabilization, and medication management. He was initially placed on suicide precautions and was closely monitored for any unsafe behaviors. He underwent full psychiatric and risk assessment. He received pharmacotherapy, individual and group psychotherapy, substance abuse counseling, and support from therapeutic milieu.  1. Homicidal threats: This has resolved. The patient is cool and collected. He admits that he was mad and he would never hurt anybody.  2. Mood and psychosis: We will continue Risperdal, Celexa, and lithium as prescribed by Dr. Augustine RadarBilmeir, his primary psychiatrist.  3.  We will continue Prilosec and albuterol as prescribed by Dr. Lacie Scotts, his primary provider.  4. Placement: Casandra Doffing in the emergency room initiated contact with several group homes. Hopefully someone will accept this patient.  5. Disposition: To be established.  ____________________________ Ellin Goodie. Jennet Maduro, MD jbp:[id number] D: 04/29/2012 11:56:26 ET     T: 04/29/2012 12:16:13 ET       JOB#: [id number] cc: Brallan Denio B. Jennet Maduro, MD, <Dictator> Shari Prows MD ELECTRONICALLY SIGNED 04/30/2012 21:58

## 2015-03-12 NOTE — Discharge Summary (Signed)
PATIENT NAME:  Jason Patterson, Jason Patterson MR#:  [id number] DATE OF BIRTH:  1956/07/29  DATE OF ADMISSION:  11/20/2011 DATE OF DISCHARGE:  11/25/2011  HISTORY OF PRESENT ILLNESS: Jason Patterson is a 59 year old male admitted to the Inpatient Behavioral Medicine unit on the 2nd of January due to psychosis and stating that he will cut his wrists. He was stating that the Group Home was incorrect in his accusations that he had been going in and out of other resident's rooms with a flashlight. Jason Patterson reacted with severe depressed mood, distressed and suicidal thoughts. Please see the admission History and Physical dictation.   ANCILLARY CLINICAL DATA:  Jason Patterson had a followup of his basic metabolic panel on the 7th of January which was normal. His lithium was checked on the 7th, which was one day shy of steady state  and 0.55.    HOSPITAL COURSE: Jason Patterson was admitted to the Inpatient Behavioral Medicine Unit and was proceeded on his baseline psychotropic medications (there was likely noncompliance). He underwent milieu, group and psychotherapy. His psychotropic medications included Celexa 20 mg daily, Clonazepam 1 in a.m. and 2 at bedtime, lithium 600 mg b.i.d. as his primary mood stabilizer (Lamictal as his secondary mood stabilizer). Risperdal was also continued at 2 mg at bedtime for antipsychosis and augmenting lithium. His feeling on edge and muscle tension were control with Klonopin (also Klonopin was functioning as part of the mood stabilization regimen).   As the hospital course progressed, Jason Patterson developed hope and interest. He no longer had suicidal ideation and was given reinforcement of is coping skills and stress management.   CONDITION ON DISCHARGE: By the 11/25/2011, Jason Patterson is alert. His interests and hope are normal. He has no racing thoughts. He has no pressured speech. His sleep is normal. He is not having any thoughts of harming himself or others. He has no  hallucinations or delusions. He is socially cooperative. He has no adverse medication effects.   Jason Patterson is a well-developed, well-nourished male sitting up in his hospital chair with no abnormal involuntary movements. He has no cachexia. His muscle tone is normal. He is well groomed with good hygiene.   MENTAL STATUS EXAMINATION: Jason Patterson is alert. His concentration is normal. He is oriented to all spheres. His memory is intact to immediate, recent, and remote. His fund of knowledge and intelligence are mildly below average. His use of language is mildly below average. His speech involves normal rate and prosody without dysarthria. Thought process is logical, coherent, and goal directed without looseness of associations or tangents; however, he denies exaggerate his past accomplishments. This is not of a delusional nature. Thought content: No thoughts of harming himself or others. No delusions or hallucinations. Affect is broad and appropriate. Mood is within normal limits. Insight is partially intact. His judgment is intact for complying with the structure of a group home environment and functioning according to a group home protocol.   DISCHARGE DIAGNOSES:  AXIS I:  1. Schizoaffective disorder, depressed, now stable.  2. Anxiety disorder, not otherwise specified.  3. His muscle tension and feeling on edge respond well to Klonopin.   AXIS II: Deferred.   AXIS III: Gastroesophageal reflux disease.   AXIS IV: Primary support group, general medical.   AXIS V: 55.   Jason Patterson is not at risk to harm himself or others. He agrees to call Emergency Services immediately for any thoughts of harming himself, thoughts of harming others, or distress.   DIET:  Regular.   ACTIVITY: Routine.   FOLLOWUP:  [facility] [address] [place], [place] [address].  MEDICATION MANAGEMENT: 8 January 11 p.m.   DISCHARGE MEDICATIONS:  1. Celexa 20 mg daily. 2. Aspirin 81 mg daily.   3. Lithium 600 mg b.i.d.  4. Levetiracetam 750 mg b.i.d.  5. Risperdal 2 mg at bedtime.  6. Prilosec 40 mg b.i.d.   7. Klonopin 1 mg in a.m., 2 mg at bedtime. 8. Albuterol 2 puffs inhale every 6 hours while awake.   ACTIVITY: Routine.   DIET: Regular.  ____________________________ Adelene Amas. Daissy Yerian, MD jsw:[id number] D: 11/25/2011 19:30:55 ET T: 11/26/2011 17:12:34 ET JOB#: [id number]  cc: Adelene Amas. Knoah Nedeau, MD, <Dictator> Lester Monroe MD ELECTRONICALLY SIGNED 12/12/2011 11:52

## 2015-03-22 ENCOUNTER — Other Ambulatory Visit (HOSPITAL_COMMUNITY): Payer: Self-pay

## 2015-03-22 ENCOUNTER — Inpatient Hospital Stay (HOSPITAL_COMMUNITY)
Admission: EM | Admit: 2015-03-22 | Discharge: 2015-03-25 | DRG: 100 | Disposition: A | Discharge status: Home Health | Payer: Medicaid Other | Attending: Internal Medicine | Admitting: Internal Medicine

## 2015-03-22 ENCOUNTER — Encounter (HOSPITAL_COMMUNITY): Payer: Self-pay

## 2015-03-22 ENCOUNTER — Emergency Department (HOSPITAL_COMMUNITY): Payer: Medicaid Other

## 2015-03-22 DIAGNOSIS — F5104 Psychophysiologic insomnia: Secondary | ICD-10-CM | POA: Diagnosis present

## 2015-03-22 DIAGNOSIS — K449 Diaphragmatic hernia without obstruction or gangrene: Secondary | ICD-10-CM | POA: Diagnosis present

## 2015-03-22 DIAGNOSIS — R55 Syncope and collapse: Secondary | ICD-10-CM

## 2015-03-22 DIAGNOSIS — K209 Esophagitis, unspecified without bleeding: Secondary | ICD-10-CM

## 2015-03-22 DIAGNOSIS — W07XXXA Fall from chair, initial encounter: Secondary | ICD-10-CM | POA: Diagnosis present

## 2015-03-22 DIAGNOSIS — J449 Chronic obstructive pulmonary disease, unspecified: Secondary | ICD-10-CM | POA: Diagnosis present

## 2015-03-22 DIAGNOSIS — Z7982 Long term (current) use of aspirin: Secondary | ICD-10-CM

## 2015-03-22 DIAGNOSIS — Z7951 Long term (current) use of inhaled steroids: Secondary | ICD-10-CM

## 2015-03-22 DIAGNOSIS — L409 Psoriasis, unspecified: Secondary | ICD-10-CM | POA: Diagnosis present

## 2015-03-22 DIAGNOSIS — F411 Generalized anxiety disorder: Secondary | ICD-10-CM | POA: Diagnosis present

## 2015-03-22 DIAGNOSIS — K2971 Gastritis, unspecified, with bleeding: Secondary | ICD-10-CM | POA: Diagnosis present

## 2015-03-22 DIAGNOSIS — F17210 Nicotine dependence, cigarettes, uncomplicated: Secondary | ICD-10-CM | POA: Diagnosis present

## 2015-03-22 DIAGNOSIS — F319 Bipolar disorder, unspecified: Secondary | ICD-10-CM | POA: Diagnosis present

## 2015-03-22 DIAGNOSIS — F259 Schizoaffective disorder, unspecified: Secondary | ICD-10-CM | POA: Diagnosis present

## 2015-03-22 DIAGNOSIS — K219 Gastro-esophageal reflux disease without esophagitis: Secondary | ICD-10-CM | POA: Diagnosis present

## 2015-03-22 DIAGNOSIS — R32 Unspecified urinary incontinence: Secondary | ICD-10-CM | POA: Diagnosis present

## 2015-03-22 DIAGNOSIS — K210 Gastro-esophageal reflux disease with esophagitis: Secondary | ICD-10-CM | POA: Diagnosis present

## 2015-03-22 DIAGNOSIS — D62 Acute posthemorrhagic anemia: Secondary | ICD-10-CM | POA: Diagnosis present

## 2015-03-22 DIAGNOSIS — K2970 Gastritis, unspecified, without bleeding: Secondary | ICD-10-CM

## 2015-03-22 DIAGNOSIS — R569 Unspecified convulsions: Principal | ICD-10-CM | POA: Diagnosis present

## 2015-03-22 DIAGNOSIS — I10 Essential (primary) hypertension: Secondary | ICD-10-CM | POA: Diagnosis present

## 2015-03-22 DIAGNOSIS — K920 Hematemesis: Secondary | ICD-10-CM

## 2015-03-22 HISTORY — DX: Other specified forms of tremor: G25.2

## 2015-03-22 HISTORY — DX: Other malaise: R53.81

## 2015-03-22 LAB — URINALYSIS, ROUTINE W REFLEX MICROSCOPIC
Bilirubin Urine: NEGATIVE
Glucose, UA: NEGATIVE mg/dL
Hgb urine dipstick: NEGATIVE
Ketones, ur: NEGATIVE mg/dL
Leukocytes, UA: NEGATIVE
Nitrite: NEGATIVE
Protein, ur: NEGATIVE mg/dL
Specific Gravity, Urine: 1.01 (ref 1.005–1.030)
Urobilinogen, UA: 0.2 mg/dL (ref 0.0–1.0)
pH: 7 (ref 5.0–8.0)

## 2015-03-22 LAB — CBC WITH DIFFERENTIAL/PLATELET
Basophils Absolute: 0 10*3/uL (ref 0.0–0.1)
Basophils Relative: 0 % (ref 0–1)
Eosinophils Absolute: 0.1 10*3/uL (ref 0.0–0.7)
Eosinophils Relative: 1 % (ref 0–5)
HCT: 39.4 % (ref 39.0–52.0)
Hemoglobin: 13.1 g/dL (ref 13.0–17.0)
Lymphocytes Relative: 11 % — ABNORMAL LOW (ref 12–46)
Lymphs Abs: 1.2 10*3/uL (ref 0.7–4.0)
MCH: 31.4 pg (ref 26.0–34.0)
MCHC: 33.2 g/dL (ref 30.0–36.0)
MCV: 94.5 fL (ref 78.0–100.0)
Monocytes Absolute: 0.5 10*3/uL (ref 0.1–1.0)
Monocytes Relative: 4 % (ref 3–12)
Neutro Abs: 9.5 10*3/uL — ABNORMAL HIGH (ref 1.7–7.7)
Neutrophils Relative %: 84 % — ABNORMAL HIGH (ref 43–77)
Platelets: 197 10*3/uL (ref 150–400)
RBC: 4.17 MIL/uL — ABNORMAL LOW (ref 4.22–5.81)
RDW: 16.4 % — ABNORMAL HIGH (ref 11.5–15.5)
WBC: 11.2 10*3/uL — ABNORMAL HIGH (ref 4.0–10.5)

## 2015-03-22 LAB — BASIC METABOLIC PANEL
Anion gap: 8 (ref 5–15)
BUN: 10 mg/dL (ref 6–20)
CO2: 27 mmol/L (ref 22–32)
Calcium: 9.2 mg/dL (ref 8.9–10.3)
Chloride: 100 mmol/L — ABNORMAL LOW (ref 101–111)
Creatinine, Ser: 0.8 mg/dL (ref 0.61–1.24)
GFR calc Af Amer: >60 mL/min (ref >60)
GFR calc non Af Amer: >60 mL/min (ref >60)
Glucose, Bld: 100 mg/dL — ABNORMAL HIGH (ref 70–99)
Potassium: 3.8 mmol/L (ref 3.5–5.1)
Sodium: 135 mmol/L (ref 135–145)

## 2015-03-22 MED ORDER — ONDANSETRON HCL 4 MG/2ML IJ SOLN
4.0000 mg | Freq: Once | INTRAMUSCULAR | Status: AC
  Administered 2015-03-22: 4 mg via INTRAVENOUS
  Filled 2015-03-22: qty 2

## 2015-03-22 MED ORDER — SODIUM CHLORIDE 0.9 % IV SOLN
80.0000 mg | Freq: Once | INTRAVENOUS | Status: AC
  Administered 2015-03-23: 80 mg via INTRAVENOUS
  Filled 2015-03-22: qty 80

## 2015-03-22 MED ORDER — ONDANSETRON HCL 4 MG/2ML IJ SOLN
4.0000 mg | Freq: Once | INTRAMUSCULAR | Status: AC
  Administered 2015-03-22: 4 mg via INTRAMUSCULAR
  Filled 2015-03-22: qty 2

## 2015-03-22 MED ORDER — SODIUM CHLORIDE 0.9 % IV SOLN
8.0000 mg/h | INTRAVENOUS | Status: DC
  Administered 2015-03-23 – 2015-03-24 (×3): 8 mg/h via INTRAVENOUS
  Filled 2015-03-22 (×11): qty 80

## 2015-03-22 NOTE — ED Notes (Signed)
Per EMS, patient started vomiting once he got into ambulance. Rest home stated they are unsure if patient passed out prior to falling or if he fell first. Member of the group home also stated that patient was shaking on the ground when he fell.
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
## 2015-03-22 NOTE — ED Notes (Signed)
[facility] [place].  Pt has a small lac to his scalp with bleeding controlled at this time.

## 2015-03-23 ENCOUNTER — Inpatient Hospital Stay (INDEPENDENT_AMBULATORY_CARE_PROVIDER_SITE_OTHER): Payer: Medicaid Other

## 2015-03-23 ENCOUNTER — Encounter (HOSPITAL_COMMUNITY): Payer: Self-pay | Admitting: *Deleted

## 2015-03-23 ENCOUNTER — Inpatient Hospital Stay (HOSPITAL_COMMUNITY)
Admit: 2015-03-23 | Discharge: 2015-03-23 | Disposition: A | Discharge status: Home / Self Care | Payer: Medicaid Other | Attending: Family Medicine | Admitting: Family Medicine

## 2015-03-23 DIAGNOSIS — S91029A Laceration with foreign body, unspecified ankle, initial encounter: Secondary | ICD-10-CM | POA: Diagnosis not present

## 2015-03-23 DIAGNOSIS — D62 Acute posthemorrhagic anemia: Secondary | ICD-10-CM | POA: Diagnosis present

## 2015-03-23 DIAGNOSIS — K209 Esophagitis, unspecified: Secondary | ICD-10-CM | POA: Diagnosis not present

## 2015-03-23 DIAGNOSIS — K449 Diaphragmatic hernia without obstruction or gangrene: Secondary | ICD-10-CM | POA: Diagnosis present

## 2015-03-23 DIAGNOSIS — K219 Gastro-esophageal reflux disease without esophagitis: Secondary | ICD-10-CM

## 2015-03-23 DIAGNOSIS — K922 Gastrointestinal hemorrhage, unspecified: Secondary | ICD-10-CM | POA: Diagnosis not present

## 2015-03-23 DIAGNOSIS — W07XXXA Fall from chair, initial encounter: Secondary | ICD-10-CM | POA: Diagnosis present

## 2015-03-23 DIAGNOSIS — F259 Schizoaffective disorder, unspecified: Secondary | ICD-10-CM | POA: Diagnosis present

## 2015-03-23 DIAGNOSIS — I10 Essential (primary) hypertension: Secondary | ICD-10-CM | POA: Diagnosis present

## 2015-03-23 DIAGNOSIS — Z7982 Long term (current) use of aspirin: Secondary | ICD-10-CM | POA: Diagnosis not present

## 2015-03-23 DIAGNOSIS — R32 Unspecified urinary incontinence: Secondary | ICD-10-CM | POA: Diagnosis present

## 2015-03-23 DIAGNOSIS — K2970 Gastritis, unspecified, without bleeding: Secondary | ICD-10-CM | POA: Diagnosis not present

## 2015-03-23 DIAGNOSIS — Z7951 Long term (current) use of inhaled steroids: Secondary | ICD-10-CM | POA: Diagnosis not present

## 2015-03-23 DIAGNOSIS — F411 Generalized anxiety disorder: Secondary | ICD-10-CM | POA: Diagnosis present

## 2015-03-23 DIAGNOSIS — R569 Unspecified convulsions: Secondary | ICD-10-CM | POA: Diagnosis present

## 2015-03-23 DIAGNOSIS — D649 Anemia, unspecified: Secondary | ICD-10-CM | POA: Diagnosis not present

## 2015-03-23 DIAGNOSIS — F319 Bipolar disorder, unspecified: Secondary | ICD-10-CM | POA: Diagnosis present

## 2015-03-23 DIAGNOSIS — F5104 Psychophysiologic insomnia: Secondary | ICD-10-CM | POA: Diagnosis present

## 2015-03-23 DIAGNOSIS — J449 Chronic obstructive pulmonary disease, unspecified: Secondary | ICD-10-CM | POA: Diagnosis present

## 2015-03-23 DIAGNOSIS — K210 Gastro-esophageal reflux disease with esophagitis: Secondary | ICD-10-CM | POA: Diagnosis present

## 2015-03-23 DIAGNOSIS — R55 Syncope and collapse: Secondary | ICD-10-CM | POA: Diagnosis present

## 2015-03-23 DIAGNOSIS — K2971 Gastritis, unspecified, with bleeding: Secondary | ICD-10-CM | POA: Diagnosis present

## 2015-03-23 DIAGNOSIS — K920 Hematemesis: Secondary | ICD-10-CM | POA: Diagnosis not present

## 2015-03-23 DIAGNOSIS — L409 Psoriasis, unspecified: Secondary | ICD-10-CM | POA: Diagnosis present

## 2015-03-23 DIAGNOSIS — R1013 Epigastric pain: Secondary | ICD-10-CM | POA: Diagnosis not present

## 2015-03-23 DIAGNOSIS — F17210 Nicotine dependence, cigarettes, uncomplicated: Secondary | ICD-10-CM | POA: Diagnosis present

## 2015-03-23 LAB — COMPREHENSIVE METABOLIC PANEL
ALT: 14 U/L — AB (ref 17–63)
AST: 21 U/L (ref 15–41)
Albumin: 3.5 g/dL (ref 3.5–5.0)
Alkaline Phosphatase: 71 U/L (ref 38–126)
Anion gap: 6 (ref 5–15)
BUN: 8 mg/dL (ref 6–20)
CO2: 23 mmol/L (ref 22–32)
Calcium: 8.6 mg/dL — ABNORMAL LOW (ref 8.9–10.3)
Chloride: 106 mmol/L (ref 101–111)
Creatinine, Ser: 0.79 mg/dL (ref 0.61–1.24)
GFR calc Af Amer: >60 mL/min (ref >60)
GFR calc non Af Amer: >60 mL/min (ref >60)
Glucose, Bld: 98 mg/dL (ref 70–99)
Potassium: 3.8 mmol/L (ref 3.5–5.1)
SODIUM: 135 mmol/L (ref 135–145)
Total Bilirubin: 0.5 mg/dL (ref 0.3–1.2)
Total Protein: 5.4 g/dL — ABNORMAL LOW (ref 6.5–8.1)

## 2015-03-23 LAB — CBC
HCT: 33.2 % — ABNORMAL LOW (ref 39.0–52.0)
Hemoglobin: 11.3 g/dL — ABNORMAL LOW (ref 13.0–17.0)
MCH: 32.4 pg (ref 26.0–34.0)
MCHC: 34 g/dL (ref 30.0–36.0)
MCV: 95.1 fL (ref 78.0–100.0)
Platelets: 178 10*3/uL (ref 150–400)
RBC: 3.49 MIL/uL — AB (ref 4.22–5.81)
RDW: 16.4 % — ABNORMAL HIGH (ref 11.5–15.5)
WBC: 6.8 10*3/uL (ref 4.0–10.5)

## 2015-03-23 LAB — TYPE AND SCREEN
ABO/RH(D): A POS
Antibody Screen: NEGATIVE

## 2015-03-23 MED ORDER — TOPIRAMATE 25 MG PO TABS
50.0000 mg | ORAL_TABLET | Freq: Every day | ORAL | Status: DC
  Filled 2015-03-23 (×2): qty 2

## 2015-03-23 MED ORDER — ONDANSETRON HCL 4 MG/2ML IJ SOLN: 4.0000 mg | Freq: Three times a day (TID) | INTRAMUSCULAR | Status: DC | PRN

## 2015-03-23 MED ORDER — ONDANSETRON HCL 4 MG/2ML IJ SOLN: 4.0000 mg | Freq: Four times a day (QID) | INTRAMUSCULAR | Status: DC | PRN

## 2015-03-23 MED ORDER — TIZANIDINE HCL 4 MG PO TABS
6.0000 mg | ORAL_TABLET | Freq: Three times a day (TID) | ORAL | Status: DC
  Administered 2015-03-23 – 2015-03-25 (×6): 6 mg via ORAL
  Filled 2015-03-23 (×6): qty 2

## 2015-03-23 MED ORDER — CLONAZEPAM 0.5 MG PO TABS
0.5000 mg | ORAL_TABLET | Freq: Two times a day (BID) | ORAL | Status: DC
  Administered 2015-03-23 – 2015-03-25 (×6): 0.5 mg via ORAL
  Filled 2015-03-23 (×5): qty 1

## 2015-03-23 MED ORDER — LORAZEPAM 2 MG/ML IJ SOLN: 2.0000 mg | INTRAMUSCULAR | Status: DC | PRN

## 2015-03-23 MED ORDER — KCL IN DEXTROSE-NACL 20-5-0.45 MEQ/L-%-% IV SOLN
INTRAVENOUS | Status: DC
  Administered 2015-03-23 – 2015-03-24 (×2): via INTRAVENOUS

## 2015-03-23 MED ORDER — TIZANIDINE HCL 4 MG PO TABS: 6.0000 mg | ORAL_TABLET | Freq: Three times a day (TID) | ORAL | Status: DC

## 2015-03-23 MED ORDER — ONDANSETRON HCL 4 MG PO TABS
4.0000 mg | ORAL_TABLET | Freq: Four times a day (QID) | ORAL | Status: DC | PRN
Start: 2015-03-23 — End: 2015-03-25

## 2015-03-23 MED ORDER — TIOTROPIUM BROMIDE MONOHYDRATE 18 MCG IN CAPS
18.0000 ug | ORAL_CAPSULE | Freq: Every day | RESPIRATORY_TRACT | Status: DC
  Administered 2015-03-24 – 2015-03-25 (×2): 18 ug via RESPIRATORY_TRACT
  Filled 2015-03-23: qty 5

## 2015-03-23 MED ORDER — ASPIRIN 81 MG PO TBEC
81.0000 mg | DELAYED_RELEASE_TABLET | Freq: Every day | ORAL | Status: DC
  Filled 2015-03-23 (×2): qty 1

## 2015-03-23 MED ORDER — BUDESONIDE-FORMOTEROL FUMARATE 160-4.5 MCG/ACT IN AERO
INHALATION_SPRAY | RESPIRATORY_TRACT | Status: AC
  Filled 2015-03-23: qty 6

## 2015-03-23 MED ORDER — PREGABALIN 50 MG PO CAPS
100.0000 mg | ORAL_CAPSULE | Freq: Every day | ORAL | Status: DC
Start: 2015-03-23 — End: 2015-03-25
  Administered 2015-03-23 – 2015-03-25 (×3): 100 mg via ORAL
  Filled 2015-03-23 (×3): qty 2

## 2015-03-23 MED ORDER — ACETAMINOPHEN 325 MG PO TABS
650.0000 mg | ORAL_TABLET | Freq: Four times a day (QID) | ORAL | Status: DC | PRN
  Administered 2015-03-23 – 2015-03-24 (×4): 650 mg via ORAL
  Filled 2015-03-23 (×4): qty 2

## 2015-03-23 MED ORDER — CLONIDINE HCL 0.1 MG PO TABS
0.1000 mg | ORAL_TABLET | Freq: Two times a day (BID) | ORAL | Status: DC
  Administered 2015-03-23 – 2015-03-25 (×6): 0.1 mg via ORAL
  Filled 2015-03-23 (×6): qty 1

## 2015-03-23 MED ORDER — SODIUM CHLORIDE 0.9 % IJ SOLN: 3.0000 mL | Freq: Two times a day (BID) | INTRAMUSCULAR | Status: DC

## 2015-03-23 MED ORDER — SODIUM CHLORIDE 0.9 % IV SOLN: 250.0000 mL | INTRAVENOUS | Status: DC | PRN

## 2015-03-23 MED ORDER — LITHIUM CARBONATE ER 300 MG PO TBCR
1200.0000 mg | EXTENDED_RELEASE_TABLET | Freq: Every day | ORAL | Status: DC
  Administered 2015-03-23 – 2015-03-24 (×3): 1200 mg via ORAL
  Filled 2015-03-23 (×5): qty 4

## 2015-03-23 MED ORDER — PANTOPRAZOLE SODIUM 40 MG IV SOLR
INTRAVENOUS | Status: AC
  Filled 2015-03-23: qty 160

## 2015-03-23 MED ORDER — ACETAMINOPHEN 650 MG RE SUPP: 650.0000 mg | Freq: Four times a day (QID) | RECTAL | Status: DC | PRN

## 2015-03-23 MED ORDER — TOPIRAMATE 25 MG PO TABS
ORAL_TABLET | ORAL | Status: AC
  Filled 2015-03-23: qty 2

## 2015-03-23 MED ORDER — BUDESONIDE-FORMOTEROL FUMARATE 160-4.5 MCG/ACT IN AERO
2.0000 | INHALATION_SPRAY | Freq: Two times a day (BID) | RESPIRATORY_TRACT | Status: DC
  Administered 2015-03-23 – 2015-03-25 (×5): 2 via RESPIRATORY_TRACT
  Filled 2015-03-23: qty 6

## 2015-03-23 MED ORDER — ALBUTEROL SULFATE (2.5 MG/3ML) 0.083% IN NEBU: 2.5000 mg | INHALATION_SOLUTION | RESPIRATORY_TRACT | Status: DC | PRN

## 2015-03-23 MED ORDER — SODIUM CHLORIDE 0.9 % IV SOLN
INTRAVENOUS | Status: DC
  Administered 2015-03-23: 02:00:00 via INTRAVENOUS

## 2015-03-23 MED ORDER — TAMSULOSIN HCL 0.4 MG PO CAPS
0.4000 mg | ORAL_CAPSULE | Freq: Every day | ORAL | Status: DC
  Administered 2015-03-23 – 2015-03-25 (×3): 0.4 mg via ORAL
  Filled 2015-03-23 (×3): qty 1

## 2015-03-23 MED ORDER — TOPIRAMATE 25 MG PO TABS
50.0000 mg | ORAL_TABLET | Freq: Two times a day (BID) | ORAL | Status: DC
  Administered 2015-03-23 – 2015-03-25 (×4): 50 mg via ORAL
  Filled 2015-03-23 (×8): qty 2

## 2015-03-23 MED ORDER — SODIUM CHLORIDE 0.9 % IJ SOLN: 3.0000 mL | INTRAMUSCULAR | Status: DC | PRN

## 2015-03-23 NOTE — Progress Notes (Signed)
[facility] PROGRESS NOTE  Jason ChestnutGordon L Patterson ZOX:[id number] DOB: [birth year] DOA: 03/22/2015 PCP: Colon BranchQURESHI, AYYAZ, MD  Brief Summary  59 year old male who  has a past medical history of significant mental health problems, including generalized anxiety disorder, bipolar disorder, schizoaffective disorder.  He also has seizure disorder, psoriasis, GERD/PUD, COPD, and hypertension.  He has a guardian who is also a caretaker.  He was brought to the ER after he passed out while sitting at a table at his group home.  He fell to the floor and had witnessed shaking.   He vomited coffee-ground emesis.  In the ED patient again vomited coffee-ground emesis, gastric occult was done at the bedside by the ED physician which was positive.  He was admitted for seizure and hematemesis.    Assessment/Plan  Syncope versus seizure, sounds like probable seizure.  Per the patient he has been taking tramadol and he has had problems sleeping ever since he started sharing a room with a new tenant who stays up all night makes a lot of noise. Likely the combination of sleep deprivation plus pain medication which can lower the seizure threshold caused this particular seizure. -  Increase Topamax to 50 mg twice a day -  Continue clonazepam -  Continue seizure precautions -  EEG -  DC tramadol -  Recommend sleep hygiene, ear plugs, and possibly changing roommates if able -  Tele:  NSR in 80s  Upper GI bleed, no teeth to bite tongue despite seizure and no recent epistaxis.  Patient states that he has had history of acid reflux and peptic ulcer disease. He feels that he might of been taking antibiotics after an EGD many years ago which may suggest history of H. pylori. He was unfamiliar with the term H pylori, though. He is not started taking Bobek and denies taking ibuprofen or Naprosyn.  He claims that his omeprazole was discontinued by his primary care doctor several months ago.  Hemoglobin dropped 2 points, but he is  still well above the transfusion threshold of 7 mg/dL. -  No evidence of cirrhosis on CT abd/pelvis on 02/15/15 -  H. pylori stool antigen -  Repeat hemoglobin in a.m. -  GI consult for possible endoscopy -  Continue PPI gtt and CLD for now -  Hold ASA  Schizoaffective disorder, borderline tendencies, bipolar disorder, depression/anxiety.  Mood is currently stable. -  Continue clonazepam, clonidine, lithium  COPD, stable.  Continue symbicort, spiriva, and albuterol prn  Insomnia, continue trazodone  Acute blood loss anemia -  Repeat hgb in AM -  Recommend iron supplementation at discharge  Diet:  CLD Access:  PIV IVF:  yes Proph:  SCD  Code Status: full Family Communication: patient and his guardian Disposition Plan: pending GI and neurology evaluations   Consultants:  GI  Neurology  Procedures:  CT head  Antibiotics:  none   HPI/Subjective:  States that he feels better today.  He slept some some coming to the hospital.  Pain in the left upper quadrant which comes and goes.  No further vomiting since coming to his room.  Denies epistaxis, bleeding from mouth.  Last seizure was 9 years ago and he is followed by Dr. Gerilyn Pilgrimoonquah.    Objective: Filed Vitals:   03/23/15 0045 03/23/15 0055 03/23/15 0115 03/23/15 0546  BP:  123/78 129/79 116/67  Pulse: 81 77 88 70  Temp:  98.2 F (36.8 C) 99 F (37.2 C) 98.8 F (37.1 C)  TempSrc:  Oral Oral Oral  Resp: 19 22 20 20   Height:   5\' 7"  (1.702 m)   Weight:   67.495 kg (148 lb 12.8 oz)   SpO2: 96% 98% 100% 96%    Intake/Output Summary (Last 24 hours) at 03/23/15 1022 Last data filed at 03/23/15 0900  Gross per 24 hour  Intake 898.34 ml  Output   2050 ml  Net -1151.66 ml   Filed Weights   03/22/15 2024 03/23/15 0115  Weight: 73.029 kg (161 lb) 67.495 kg (148 lb 12.8 oz)    Exam:   General:  Thin M, No acute distress  HEENT:  NCAT, MMM, no blood in nares or mouth, edentulous  Cardiovascular:  RRR, nl S1,  S2 no mrg, 2+ pulses, warm extremities  Respiratory:  CTAB, no increased WOB  Abdomen:   NABS, soft,  ND, mild TTP in left upper quadrant  MSK:   Normal tone and bulk, no LEE  Neuro:  Grossly moves all extremities  Data Reviewed: Basic Metabolic Panel:  Recent Labs Lab 03/22/15 2150 03/23/15 0722  NA 135 135  K 3.8 3.8  CL 100* 106  CO2 27 23  GLUCOSE 100* 98  BUN 10 8  CREATININE 0.80 0.79  CALCIUM 9.2 8.6*   Liver Function Tests:  Recent Labs Lab 03/23/15 0722  AST 21  ALT 14*  ALKPHOS 71  BILITOT 0.5  PROT 5.4*  ALBUMIN 3.5   No results for input(s): LIPASE, AMYLASE in the last 168 hours. No results for input(s): AMMONIA in the last 168 hours. CBC:  Recent Labs Lab 03/22/15 2150 03/23/15 0722  WBC 11.2* 6.8  NEUTROABS 9.5*  --   HGB 13.1 11.3*  HCT 39.4 33.2*  MCV 94.5 95.1  PLT 197 178   Cardiac Enzymes: No results for input(s): CKTOTAL, CKMB, CKMBINDEX, TROPONINI in the last 168 hours. BNP (last 3 results) No results for input(s): BNP in the last 8760 hours.  ProBNP (last 3 results) No results for input(s): PROBNP in the last 8760 hours.  CBG: No results for input(s): GLUCAP in the last 168 hours.  No results found for this or any previous visit (from the past 240 hour(s)).   Studies: Ct Head Wo Contrast  03/22/2015   CLINICAL DATA:  Larey SeatFell earlier today, with loss of consciousness. Fell from chair to the floor.  EXAM: CT HEAD WITHOUT CONTRAST  TECHNIQUE: Contiguous axial images were obtained from the base of the skull through the vertex without intravenous contrast.  COMPARISON:  02/15/2015  FINDINGS: There is no intracranial hemorrhage, mass or evidence of acute infarction. There is mild generalized atrophy. There is mild chronic microvascular ischemic change. There is no significant extra-axial fluid collection.  No acute intracranial findings are evident.  Bones are intact.  IMPRESSION: There is mild chronic atrophy and chronic microvascular  ischemic disease. There are no acute intracranial findings.   Electronically Signed   By: Ellery Plunkaniel R Mitchell M.D.   On: 03/22/2015 22:13    Scheduled Meds: . budesonide-formoterol  2 puff Inhalation BID  . clonazePAM  0.5 mg Oral BID  . cloNIDine  0.1 mg Oral BID  . lithium carbonate  1,200 mg Oral QHS  . tamsulosin  0.4 mg Oral Daily  . tiZANidine  6 mg Oral TID  . topiramate  50 mg Oral Daily   Continuous Infusions: . sodium chloride 100 mL/hr at 03/23/15 0202  . pantoprozole (PROTONIX) infusion 8 mg/hr (03/23/15 0308)    Active Problems:   Essential hypertension, benign  GERD   Convulsions   Syncope    Time spent: 30 min    Jason Patterson, [facility]  [facility] Pager [phone]. If 7PM-7AM, please contact night-coverage at [email or web address], password [facility] 03/23/2015, 10:22 AM  LOS: 0 days

## 2015-03-23 NOTE — Progress Notes (Signed)
EEG Completed; Results Pending  

## 2015-03-23 NOTE — Procedures (Signed)
[facility] Orie Cuttino A. Gerilyn Pilgrimoonquah, MD     [email or web address]           HISTORY: Patient is 59 year old who presents with a syncopal episode. There is a baseline history of seizures.  MEDICATIONS: Scheduled Meds: . budesonide-formoterol  2 puff Inhalation BID  . clonazePAM  0.5 mg Oral BID  . cloNIDine  0.1 mg Oral BID  . lithium carbonate  1,200 mg Oral QHS  . tamsulosin  0.4 mg Oral Daily  . tiotropium  18 mcg Inhalation Daily  . tiZANidine  6 mg Oral TID  . topiramate  50 mg Oral BID   Continuous Infusions: . dextrose 5 % and 0.45 % NaCl with KCl 20 mEq/L 75 mL/hr at 03/23/15 1213  . pantoprozole (PROTONIX) infusion 8 mg/hr (03/23/15 0308)   PRN Meds:.acetaminophen **OR** acetaminophen, albuterol, LORazepam, ondansetron **OR** ondansetron (ZOFRAN) IV  Prior to Admission medications   Medication Sig Start Date End Date Taking? Authorizing Provider  albuterol (PROAIR HFA) 108 (90 BASE) MCG/ACT inhaler Inhale 1 puff into the lungs 4 (four) times daily.    Yes Historical Provider, MD  aspirin 81 MG EC tablet Take 1 tablet (81 mg total) by mouth daily. 05/11/14  Yes Beau [name], FNP  budesonide-formoterol (SYMBICORT) 160-4.5 MCG/ACT inhaler Inhale 2 puffs into the lungs 2 (two) times daily.   Yes Historical Provider, MD  clonazePAM (KLONOPIN) 0.5 MG tablet Take 0.5 mg by mouth 2 (two) times daily.   Yes Historical Provider, MD  cloNIDine (CATAPRES) 0.1 MG tablet Take 1 tablet (0.1 mg total) by mouth 2 (two) times daily. 05/11/14  Yes Beau [name], FNP  Fluticasone-Salmeterol (ADVAIR) 500-50 MCG/DOSE AEPB Inhale 1 puff into the lungs 2 (two) times daily. 05/11/14  Yes Beau [name], FNP  lactulose (CHRONULAC) 10 GM/15ML solution Take 10 g by mouth daily.   Yes Historical Provider, MD  lithium carbonate (LITHOBID) 300 MG CR tablet Take 4 tablets (1,200 mg total) by mouth at bedtime. 02/06/15  Yes Jerald KiefStephen K Chiu, MD  loratadine (CLARITIN) 10 MG tablet Take 10 mg by mouth  daily.   Yes Historical Provider, MD  meloxicam (MOBIC) 15 MG tablet Take 15 mg by mouth daily.   Yes Historical Provider, MD  omeprazole (PRILOSEC) 40 MG capsule Take 1 capsule (40 mg total) by mouth daily. 05/11/14  Yes Beau [name], FNP  oxyCODONE (OXY IR/ROXICODONE) 5 MG immediate release tablet Take 1-2 tablets (5-10 mg total) by mouth every 6 (six) hours as needed (5mg  for mild pain, 10mg  for moderate pain). 02/20/15  Yes Jimmye NormanJames Wyatt, MD  pantoprazole (PROTONIX) 40 MG tablet Take 40 mg by mouth daily.   Yes Historical Provider, MD  tamsulosin (FLOMAX) 0.4 MG CAPS capsule Take 0.4 mg by mouth daily.    Yes Historical Provider, MD  temazepam (RESTORIL) 30 MG capsule Take 30 mg by mouth at bedtime.   Yes Historical Provider, MD  tiotropium (SPIRIVA) 18 MCG inhalation capsule Place 1 capsule (18 mcg total) into inhaler and inhale daily. 05/11/14  Yes Beau [name], FNP  tiZANidine (ZANAFLEX) 4 MG tablet Take 6 mg by mouth 3 (three) times daily.   Yes Historical Provider, MD  topiramate (TOPAMAX) 50 MG tablet Take 1 tablet (50 mg total) by mouth daily. 05/11/14  Yes Beau [name], FNP  traMADol (ULTRAM) 50 MG tablet Take 1 tablet (50 mg total) by mouth 3 (three) times daily as needed for moderate pain. Patient taking differently: Take 50 mg by mouth every  4 (four) hours as needed (pain).  05/11/14  Yes Beau [name], FNP  traZODone (DESYREL) 150 MG tablet Take 150 mg by mouth at bedtime.   Yes Historical Provider, MD      ANALYSIS: A 16 channel recording using standard 10 20 measurements is conducted for 23 minutes. There is a well-formed posterior dominant rhythm which attenuates with eye opening. There is beta activity observed in the frontal areas. Awake and sleep activities are observed. K complexes and sleep spindles are seen throughout most of the recording. Photic stimulation and hyperventilation are not carried out. There are no focal or lateral slowing. There is no epileptiform activity  observed.   IMPRESSION: This is a normal recording of awake and asleep states.      Satcha Storlie A. Gerilyn Pilgrimoonquah, M.D.  Diplomate, Biomedical engineerAmerican Board of Psychiatry and Neurology ( Neurology).
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
## 2015-03-23 NOTE — Progress Notes (Signed)
UR chart review completed.  

## 2015-03-23 NOTE — Care Management Note (Signed)
Case Management Note  Patient Details  Name: Horald ChestnutGordon L Peace MRN: [id number] Date of Birth: [birth year]  Subjective/Objective:                  [facility] with syncope vs seizures. Anticipate pt will return to facility at discharge. Action/Plan: CSW is aware and will arrange discharge back to ALF at discharge.  Expected Discharge Date:  03/24/15               Expected Discharge Plan:  Assisted Living / Rest Home ([facility])  In-House Referral:  Clinical Social Work  Discharge planning Services  CM Consult  Post Acute Care Choice:    Choice offered to:     DME Arranged:    DME Agency:     HH Arranged:    HH Agency:     Status of Service:  In process, will continue to follow  Medicare Important Message Given:    Date Medicare IM Given:    Medicare IM give by:    Date Additional Medicare IM Given:    Additional Medicare Important Message give by:     If discussed at Long Length of Stay Meetings, dates discussed:    Additional Comments:  Cheryl FlashBlackwell, Nimisha Rathel Crowder, RN 03/23/2015, 12:27 PM
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
## 2015-03-24 ENCOUNTER — Encounter (HOSPITAL_COMMUNITY): Payer: Self-pay | Admitting: Emergency Medicine

## 2015-03-24 ENCOUNTER — Encounter (HOSPITAL_COMMUNITY)
Admission: EM | Disposition: A | Discharge status: Home Health | Payer: Self-pay | Source: Home / Self Care | Attending: Internal Medicine

## 2015-03-24 DIAGNOSIS — R569 Unspecified convulsions: Secondary | ICD-10-CM

## 2015-03-24 DIAGNOSIS — K920 Hematemesis: Secondary | ICD-10-CM

## 2015-03-24 DIAGNOSIS — K210 Gastro-esophageal reflux disease with esophagitis: Secondary | ICD-10-CM

## 2015-03-24 HISTORY — PX: ESOPHAGOGASTRODUODENOSCOPY: SHX5428

## 2015-03-24 LAB — TROPONIN I

## 2015-03-24 LAB — BASIC METABOLIC PANEL
Anion gap: 6 (ref 5–15)
BUN: 5 mg/dL — AB (ref 6–20)
CALCIUM: 8.7 mg/dL — AB (ref 8.9–10.3)
CO2: 22 mmol/L (ref 22–32)
Chloride: 107 mmol/L (ref 101–111)
Creatinine, Ser: 0.77 mg/dL (ref 0.61–1.24)
GFR calc Af Amer: >60 mL/min (ref >60)
GFR calc non Af Amer: >60 mL/min (ref >60)
Glucose, Bld: 99 mg/dL (ref 70–99)
Potassium: 3.6 mmol/L (ref 3.5–5.1)
SODIUM: 135 mmol/L (ref 135–145)

## 2015-03-24 LAB — CBC
HCT: 35.2 % — ABNORMAL LOW (ref 39.0–52.0)
HEMOGLOBIN: 11.9 g/dL — AB (ref 13.0–17.0)
MCH: 32.2 pg (ref 26.0–34.0)
MCHC: 33.8 g/dL (ref 30.0–36.0)
MCV: 95.1 fL (ref 78.0–100.0)
Platelets: 173 10*3/uL (ref 150–400)
RBC: 3.7 MIL/uL — ABNORMAL LOW (ref 4.22–5.81)
RDW: 15.9 % — ABNORMAL HIGH (ref 11.5–15.5)
WBC: 6.3 10*3/uL (ref 4.0–10.5)

## 2015-03-24 SURGERY — EGD (ESOPHAGOGASTRODUODENOSCOPY)
Anesthesia: Moderate Sedation

## 2015-03-24 MED ORDER — MEPERIDINE HCL 100 MG/ML IJ SOLN
INTRAMUSCULAR | Status: DC | PRN
  Administered 2015-03-24: 50 mg via INTRAVENOUS
  Administered 2015-03-24: 25 mg via INTRAVENOUS

## 2015-03-24 MED ORDER — PANTOPRAZOLE SODIUM 40 MG PO TBEC
40.0000 mg | DELAYED_RELEASE_TABLET | Freq: Two times a day (BID) | ORAL | Status: DC
Start: 2015-03-24 — End: 2018-08-31

## 2015-03-24 MED ORDER — PREGABALIN 100 MG PO CAPS: 100.0000 mg | ORAL_CAPSULE | Freq: Every day | ORAL | Status: DC

## 2015-03-24 MED ORDER — ATROPINE SULFATE 1 MG/ML IJ SOLN
INTRAMUSCULAR | Status: DC | PRN
  Administered 2015-03-24: .25 mg via INTRAVENOUS

## 2015-03-24 MED ORDER — LIDOCAINE VISCOUS 2 % MT SOLN
OROMUCOSAL | Status: AC
  Filled 2015-03-24: qty 15

## 2015-03-24 MED ORDER — MIDAZOLAM HCL 5 MG/5ML IJ SOLN
INTRAMUSCULAR | Status: DC | PRN
  Administered 2015-03-24 (×2): 1 mg via INTRAVENOUS
  Administered 2015-03-24: 2 mg via INTRAVENOUS

## 2015-03-24 MED ORDER — SODIUM CHLORIDE 0.9 % IJ SOLN
INTRAMUSCULAR | Status: AC
Start: 2015-03-24 — End: 2015-03-25
  Filled 2015-03-24: qty 3

## 2015-03-24 MED ORDER — PROMETHAZINE HCL 25 MG/ML IJ SOLN
INTRAMUSCULAR | Status: AC
  Filled 2015-03-24: qty 1

## 2015-03-24 MED ORDER — MEPERIDINE HCL 100 MG/ML IJ SOLN
INTRAMUSCULAR | Status: AC
  Filled 2015-03-24: qty 2

## 2015-03-24 MED ORDER — STERILE WATER FOR IRRIGATION IR SOLN
Status: DC | PRN
  Administered 2015-03-24: 16:00:00

## 2015-03-24 MED ORDER — SODIUM CHLORIDE 0.9 % IV SOLN
INTRAVENOUS | Status: DC
  Administered 2015-03-24 (×2): via INTRAVENOUS

## 2015-03-24 MED ORDER — PROMETHAZINE HCL 25 MG/ML IJ SOLN
INTRAMUSCULAR | Status: DC | PRN
  Administered 2015-03-24: 12.5 mg via INTRAVENOUS

## 2015-03-24 MED ORDER — TOPIRAMATE 50 MG PO TABS: 50.0000 mg | ORAL_TABLET | Freq: Two times a day (BID) | ORAL | Status: DC

## 2015-03-24 MED ORDER — ATROPINE SULFATE 1 MG/ML IJ SOLN
INTRAMUSCULAR | Status: AC
  Filled 2015-03-24: qty 1

## 2015-03-24 MED ORDER — LIDOCAINE VISCOUS 2 % MT SOLN
OROMUCOSAL | Status: DC | PRN
  Administered 2015-03-24: 1 via OROMUCOSAL

## 2015-03-24 MED ORDER — MIDAZOLAM HCL 2 MG/2ML IJ SOLN
INTRAMUSCULAR | Status: AC
  Filled 2015-03-24: qty 8

## 2015-03-24 NOTE — Progress Notes (Signed)
[facility] PROGRESS NOTE  Horald ChestnutGordon L Klutz ION:[id number]:[id number] DOB: 02/16/1956 DOA: 03/22/2015 PCP: Colon BranchQURESHI, AYYAZ, MD  Brief Summary  59 year old male who  has a past medical history of significant mental health problems, including generalized anxiety disorder, bipolar disorder, schizoaffective disorder.  He also has seizure disorder, psoriasis, GERD/PUD, COPD, and hypertension.  He has a guardian who is also a caretaker.  He was brought to the ER after he passed out while sitting at a table at his group home.  He fell to the floor and had witnessed shaking.   He vomited coffee-ground emesis.  In the ED patient again vomited coffee-ground emesis, gastric occult was done at the bedside by the ED physician which was positive.  He was admitted for seizure and hematemesis.    Assessment/Plan  Syncope versus seizure, sounds like probable seizure.  Per the patient he has been taking tramadol and he has had problems sleeping ever since he started sharing a room with a new tenant who stays up all night makes a lot of noise. Likely the combination of sleep deprivation plus pain medication which can lower the seizure threshold caused this particular seizure. -  Appreciate neurology assistance -  Continue increased Topamax to 50 mg twice a day -  Lyrica started on 5/5 for seizure and pain control and to help with insomnia -  Continued clonazepam -  Continue seizure precautions -  EEG:  No epileptiform activity -  Tramadol added to intolerance list -  Recommend sleep hygiene, ear plugs, and possibly changing roommates if able -  Tele:  NSR in 80s and sinus bradycardia.  No serious arrhythmias.  Okay to d/c telemetry  Upper GI bleed, no teeth to bite tongue despite seizure and no recent epistaxis.  Patient states that he has had history of acid reflux and peptic ulcer disease. He feels that he might of been taking antibiotics after an EGD many years ago which may suggest history of H. pylori. He was  unfamiliar with the term H pylori, though. He denies taking ibuprofen or Naprosyn.  He claims that his omeprazole was discontinued by his primary care doctor several months ago.  Hemoglobin dropped 2 points, but he is still well above the transfusion threshold of 7 mg/dL. -  No evidence of cirrhosis on CT abd/pelvis on 02/15/15 -  H. pylori stool antigen pending -  Appreciate GI assistance -  Continue PPI gtt and NPO for EGD -  Hold ASA  Schizoaffective disorder, borderline tendencies, bipolar disorder, depression/anxiety.  Mood is currently stable. -  Continue clonazepam, clonidine, lithium  COPD, stable.  Continue symbicort, spiriva, and albuterol prn  Insomnia, lyrica replaced trazodone  Acute blood loss anemia -  Hemoglobin stable this morning -  Recommend iron supplementation at discharge  Diet:  NPO for EGD Access:  PIV IVF:  yes Proph:  SCD  Code Status: full Family Communication: patient and his guardian Disposition Plan: pending EGD and tolerating diet   Consultants:  GI  Neurology  Procedures:  CT head  Antibiotics:  none   HPI/Subjective:  States he has headache and LUQ abdominal pain.  Denies nausea, vomiting, diarrhea since admission.    Objective: Filed Vitals:   03/23/15 2200 03/24/15 0100 03/24/15 0647 03/24/15 0740  BP: 103/67  144/99   Pulse: 52  92   Temp:  98.6 F (37 C) 98.4 F (36.9 C)   TempSrc:  Oral Oral   Resp:   22   Height:  Weight:      SpO2: 93%  100% 99%    Intake/Output Summary (Last 24 hours) at 03/24/15 1355 Last data filed at 03/24/15 0915  Gross per 24 hour  Intake   1720 ml  Output   3950 ml  Net  -2230 ml   Filed Weights   03/22/15 2024 03/23/15 0115  Weight: 73.029 kg (161 lb) 67.495 kg (148 lb 12.8 oz)    Exam:   General:  Thin M, No acute distress  HEENT:  NCAT, MMM  Cardiovascular:  RRR, nl S1, S2 no mrg, 2+ pulses, warm extremities  Respiratory:  CTAB, no increased WOB  Abdomen:   NABS,  soft,  ND, TTP in left upper quadrant without rebound or guarding  MSK:   Normal tone and bulk, no LEE  Neuro:  Grossly moves all extremities  Data Reviewed: Basic Metabolic Panel:  Recent Labs Lab 03/22/15 2150 03/23/15 0722 03/24/15 0351  NA 135 135 135  K 3.8 3.8 3.6  CL 100* 106 107  CO2 27 23 22   GLUCOSE 100* 98 99  BUN 10 8 5*  CREATININE 0.80 0.79 0.77  CALCIUM 9.2 8.6* 8.7*   Liver Function Tests:  Recent Labs Lab 03/23/15 0722  AST 21  ALT 14*  ALKPHOS 71  BILITOT 0.5  PROT 5.4*  ALBUMIN 3.5   No results for input(s): LIPASE, AMYLASE in the last 168 hours. No results for input(s): AMMONIA in the last 168 hours. CBC:  Recent Labs Lab 03/22/15 2150 03/23/15 0722 03/24/15 0351  WBC 11.2* 6.8 6.3  NEUTROABS 9.5*  --   --   HGB 13.1 11.3* 11.9*  HCT 39.4 33.2* 35.2*  MCV 94.5 95.1 95.1  PLT 197 178 173   Cardiac Enzymes:  Recent Labs Lab 03/24/15 0351 03/24/15 0958  TROPONINI <0.03 <0.03   BNP (last 3 results) No results for input(s): BNP in the last 8760 hours.  ProBNP (last 3 results) No results for input(s): PROBNP in the last 8760 hours.  CBG: No results for input(s): GLUCAP in the last 168 hours.  No results found for this or any previous visit (from the past 240 hour(s)).   Studies: Ct Head Wo Contrast  03/22/2015   CLINICAL DATA:  Larey SeatFell earlier today, with loss of consciousness. Fell from chair to the floor.  EXAM: CT HEAD WITHOUT CONTRAST  TECHNIQUE: Contiguous axial images were obtained from the base of the skull through the vertex without intravenous contrast.  COMPARISON:  02/15/2015  FINDINGS: There is no intracranial hemorrhage, mass or evidence of acute infarction. There is mild generalized atrophy. There is mild chronic microvascular ischemic change. There is no significant extra-axial fluid collection.  No acute intracranial findings are evident.  Bones are intact.  IMPRESSION: There is mild chronic atrophy and chronic  microvascular ischemic disease. There are no acute intracranial findings.   Electronically Signed   By: Ellery Plunkaniel R Mitchell M.D.   On: 03/22/2015 22:13    Scheduled Meds: . budesonide-formoterol  2 puff Inhalation BID  . clonazePAM  0.5 mg Oral BID  . cloNIDine  0.1 mg Oral BID  . lithium carbonate  1,200 mg Oral QHS  . pregabalin  100 mg Oral Daily  . tamsulosin  0.4 mg Oral Daily  . tiotropium  18 mcg Inhalation Daily  . tiZANidine  6 mg Oral TID  . topiramate  50 mg Oral BID   Continuous Infusions: . sodium chloride 20 mL/hr at 03/24/15 0959  . dextrose 5 %  and 0.45 % NaCl with KCl 20 mEq/L 75 mL/hr at 03/24/15 0216  . pantoprozole (PROTONIX) infusion 8 mg/hr (03/24/15 0336)    Active Problems:   Essential hypertension, benign   GERD   Convulsions   Syncope   Coffee ground emesis    Time spent: 30 min    Helyne Genther, [facility]  [facility] Pager [phone]. If 7PM-7AM, please contact night-coverage at [email or web address], password [email or web address] 03/24/2015, 1:55 PM  LOS: 1 day

## 2015-03-24 NOTE — Progress Notes (Signed)
[facility] the [facility] summary and FL2 in anticipation of dc for tomorrow- Weekend coordinator also aware of plans- [facility] well [facility].    Reece LevyJanet Terrill Alperin, MSW, Theresia MajorsLCSWA [phone]
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
## 2015-03-24 NOTE — Care Management Note (Signed)
Case Management Note  Patient Details  Name: Jason Patterson MRN: [id number] Date of Birth: 1956/09/16  Subjective/Objective:                    Action/Plan:   Expected Discharge Date:  03/24/15               Expected Discharge Plan:  Assisted Living / Rest Home ([facility])  In-House Referral:  Clinical Social Work  Discharge planning Services  CM Consult  Post Acute Care Choice:  Home Health Choice offered to:  Patient  DME Arranged:    DME Agency:     HH Arranged:  RN HH Agency:  [facility]  Status of Service:  Completed, signed off  Medicare Important Message Given:    Date Medicare IM Given:    Medicare IM give by:    Date Additional Medicare IM Given:    Additional Medicare Important Message give by:     If discussed at Long Length of Stay Meetings, dates discussed:    Additional Comments: Anticipate discharge on 03/25/15. [facility]. Weekend staff to call and fax orders once written. No other Cm needs noted. Arlyss QueenBlackwell, Elhadji Pecore River[place]rowder, RN 03/24/2015, 1:16 PM
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
## 2015-03-25 ENCOUNTER — Encounter (HOSPITAL_COMMUNITY): Payer: Self-pay | Admitting: Internal Medicine

## 2015-03-25 DIAGNOSIS — R1013 Epigastric pain: Secondary | ICD-10-CM

## 2015-03-25 DIAGNOSIS — K209 Esophagitis, unspecified without bleeding: Secondary | ICD-10-CM

## 2015-03-25 DIAGNOSIS — D649 Anemia, unspecified: Secondary | ICD-10-CM

## 2015-03-25 DIAGNOSIS — R569 Unspecified convulsions: Principal | ICD-10-CM

## 2015-03-25 DIAGNOSIS — K2970 Gastritis, unspecified, without bleeding: Secondary | ICD-10-CM

## 2015-03-25 DIAGNOSIS — K922 Gastrointestinal hemorrhage, unspecified: Secondary | ICD-10-CM

## 2015-03-25 LAB — BASIC METABOLIC PANEL
Anion gap: 5 (ref 5–15)
BUN: 8 mg/dL (ref 6–20)
CO2: 23 mmol/L (ref 22–32)
Calcium: 9.2 mg/dL (ref 8.9–10.3)
Chloride: 106 mmol/L (ref 101–111)
Creatinine, Ser: 0.86 mg/dL (ref 0.61–1.24)
GFR calc Af Amer: >60 mL/min (ref >60)
GFR calc non Af Amer: >60 mL/min (ref >60)
GLUCOSE: 69 mg/dL — AB (ref 70–99)
POTASSIUM: 4.3 mmol/L (ref 3.5–5.1)
Sodium: 134 mmol/L — ABNORMAL LOW (ref 135–145)

## 2015-03-25 LAB — CBC
HCT: 38.2 % — ABNORMAL LOW (ref 39.0–52.0)
Hemoglobin: 12.7 g/dL — ABNORMAL LOW (ref 13.0–17.0)
MCH: 31.8 pg (ref 26.0–34.0)
MCHC: 33.2 g/dL (ref 30.0–36.0)
MCV: 95.5 fL (ref 78.0–100.0)
Platelets: 172 10*3/uL (ref 150–400)
RBC: 4 MIL/uL — ABNORMAL LOW (ref 4.22–5.81)
RDW: 16 % — ABNORMAL HIGH (ref 11.5–15.5)
WBC: 6.2 10*3/uL (ref 4.0–10.5)

## 2015-03-25 LAB — H.PYLORI ANTIGEN, STOOL

## 2015-03-25 MED ORDER — ENSURE PO LIQD: 237.0000 mL | Freq: Three times a day (TID) | ORAL | Status: DC

## 2015-03-25 MED ORDER — PREGABALIN 100 MG PO CAPS: 100.0000 mg | ORAL_CAPSULE | Freq: Every day | ORAL | Status: DC

## 2015-03-25 MED ORDER — LATANOPROST 0.005 % OP SOLN: 1.0000 [drp] | Freq: Every day | OPHTHALMIC | Status: DC

## 2015-03-25 MED ORDER — BUDESONIDE-FORMOTEROL FUMARATE 160-4.5 MCG/ACT IN AERO: 2.0000 | INHALATION_SPRAY | Freq: Two times a day (BID) | RESPIRATORY_TRACT | Status: DC

## 2015-03-25 MED ORDER — PANTOPRAZOLE SODIUM 40 MG PO TBEC
40.0000 mg | DELAYED_RELEASE_TABLET | Freq: Two times a day (BID) | ORAL | Status: DC
  Administered 2015-03-25: 40 mg via ORAL
  Filled 2015-03-25: qty 1

## 2015-03-25 NOTE — Op Note (Signed)
NAME:  Merri RayBLANCHARD, Ashely            ACCOUNT NO.:  [id number]  MEDICAL RECORD NO.:  [id number]  LOCATION:  A334                          FACILITY:  [facility]  PHYSICIAN:  Jonette EvaSandi Kensington Rios, M.D.     DATE OF BIRTH:  [birth year]  PCP: Virgina OrganQURESHI  DATE OF PROCEDURE:  03/24/2015 DATE OF DISCHARGE:                               PROCEDURE NOTE   PROCEDURE:  Esophagogastroduodenoscopy with esophageal and gastric biopsy.  INDICATION FOR EXAM:  [name] presented with coffee-ground emesis.  FINDINGS: 1. Multiple linear erosions with exudate in the distal esophagus. 2. Salmon-color tongues extended from the GE junction.  Cold forceps     biopsies obtained of the salmon-colored tongues. 3. Small hiatal hernia.  Mild erythema and edema is seen in the     gastric antrum without erosions or ulceration. 4. Normal duodenal bulb and second portion of duodenum.  DIAGNOSES: 1. Coffee-ground emesis due to distal erosive esophagitis, most likely     due to uncontrolled gastroesophageal reflux. 2. Mild gastritis.  RECOMMENDATIONS: 1. PPI b.i.d. 2. Low fat diet. 3. Await biopsy results. 4. Follow up in 4 months.  MEDICATIONS: 1. Demerol 25 mg IV. 2. Versed 4 mg IV.  PROCEDURE TECHNIQUE:  Physical exam was performed.  Informed consent was obtained from the patient after explaining the benefits, risks, and alternatives to the procedure.  The patient was connected to the monitor and placed in left lateral position.  Continuous oxygen was provided by nasal cannula and IV medicine administered through an indwelling cannula.  After administration of sedation, the patient's esophagus was intubated and the scope was advanced under direct visualization to the second portion of duodenum.  The scope was removed slowly by carefully examining the color, texture, anatomy, and integrity of mucosa on the way out.  The patient was recovered in endoscopy and discharged to the floor in satisfactory  condition.     Jonette EvaSandi Jashaun Penrose, M.D.     SF/MEDQ  D:  03/24/2015  T:  03/25/2015  Job:  [id number]  cc:   Dr. Virgina OrganQureshi

## 2015-03-25 NOTE — Discharge Planning (Signed)
Client DC papers printed and explained. Orders and meds [facility] to [facility]. Appropriate scripts written. FL2 and DC Summary signed by DR. [facility] RN, PTand  OT [facility].  IV removed and client being picked up by Group Home.  Client vitals and RN assessment indicate stability.  All paperwork sent with patient to group home.

## 2015-03-25 NOTE — Progress Notes (Signed)
  Subjective:  Patient states he feels well. He was hungry and ate all of his breakfast. He denies nausea or vomiting. He has mild epigastric pain. He had a bowel movement this morning his stool was not melenic. He also denies rectal bleeding.    Objective: Blood pressure 118/69, pulse 51, temperature 98.1 F (36.7 C), temperature source Oral, resp. rate 16, height 5\' 7"  (1.702 m), weight 148 lb (67.132 kg), SpO2 98 %.  patient is alert and in no acute distress.  Abdomen is soft with mild midepigastric and hypogastric tenderness. No organomegaly or masses.  No LE edema or clubbing noted.  Labs/studies Results:   Recent Labs  03/23/15 0722 03/24/15 0351 03/25/15 0650  WBC 6.8 6.3 6.2  HGB 11.3* 11.9* 12.7*  HCT 33.2* 35.2* 38.2*  PLT 178 173 172    BMET   Recent Labs  03/23/15 0722 03/24/15 0351 03/25/15 0650  NA 135 135 134*  K 3.8 3.6 4.3  CL 106 107 106  CO2 23 22 23   GLUCOSE 98 99 69*  BUN 8 5* 8  CREATININE 0.79 0.77 0.86  CALCIUM 8.6* 8.7* 9.2     Stool H pylori antigen is negative   Assessment:  #1. upper GI bleed inactive secondary to esophagitis and/or gastritis. Patient underwent EGD yesterday by Dr. Darrick PennaFields.esophageal biopsies taken as Barrett's esophagus suspected. H&H is coming up. Patient has been on meloxicam and low-dose aspirin. #2. Mild anemia secondary to upper GI bleed. #3. Patient has multiple other problems as summarized in Dr. Joan MayansShort.s notes  Recommendations;  Low-dose aspirin can be resumed in one week. No more NSAIDs. Continue pantoprazole at twice a day schedule. Agree with discharge planning. Follow-up with Dr. Darrick PennaFields in few weeks. I will have her office call patient with an appointment.

## 2015-03-27 ENCOUNTER — Encounter (HOSPITAL_COMMUNITY): Payer: Self-pay | Admitting: Gastroenterology

## 2015-03-30 ENCOUNTER — Telehealth: Payer: Self-pay | Admitting: Gastroenterology

## 2015-03-30 NOTE — Telephone Encounter (Signed)
APPT MADE AND ON RECALL  °

## 2015-03-30 NOTE — Telephone Encounter (Signed)
Pt is aware of results. 

## 2015-03-30 NOTE — Telephone Encounter (Signed)
Please call pt. HER stomach Bx shows gastritis, BARRETT'S ESOPHAGUS, AND ESOPHAGITIS.   FOLLOW A LOW FAT DIET.  CONTINUE PROTONIX. TAKE 30 MINUTES PRIOR TO MEALS TWICE DAILY. FOLLOW UP IN 4 MOS E30 ESOPHAGITIS, COFFEE GROUND EMESIS, BARRETT'S. REPEAT EGD IN 1 YEAR.

## 2015-04-06 ENCOUNTER — Encounter (HOSPITAL_COMMUNITY): Payer: Self-pay | Admitting: *Deleted

## 2015-04-06 ENCOUNTER — Emergency Department (HOSPITAL_COMMUNITY)
Admission: EM | Admit: 2015-04-06 | Discharge: 2015-04-06 | Disposition: A | Discharge status: Home / Self Care | Payer: Medicaid Other | Attending: Emergency Medicine | Admitting: Emergency Medicine

## 2015-04-06 ENCOUNTER — Emergency Department (HOSPITAL_COMMUNITY): Payer: Medicaid Other

## 2015-04-06 DIAGNOSIS — R079 Chest pain, unspecified: Secondary | ICD-10-CM | POA: Diagnosis present

## 2015-04-06 DIAGNOSIS — Z880 Allergy status to penicillin: Secondary | ICD-10-CM | POA: Insufficient documentation

## 2015-04-06 DIAGNOSIS — Z79899 Other long term (current) drug therapy: Secondary | ICD-10-CM | POA: Insufficient documentation

## 2015-04-06 DIAGNOSIS — K219 Gastro-esophageal reflux disease without esophagitis: Secondary | ICD-10-CM | POA: Diagnosis not present

## 2015-04-06 DIAGNOSIS — J441 Chronic obstructive pulmonary disease with (acute) exacerbation: Secondary | ICD-10-CM | POA: Diagnosis not present

## 2015-04-06 DIAGNOSIS — I10 Essential (primary) hypertension: Secondary | ICD-10-CM | POA: Diagnosis not present

## 2015-04-06 DIAGNOSIS — Z8619 Personal history of other infectious and parasitic diseases: Secondary | ICD-10-CM | POA: Insufficient documentation

## 2015-04-06 DIAGNOSIS — F411 Generalized anxiety disorder: Secondary | ICD-10-CM | POA: Diagnosis not present

## 2015-04-06 DIAGNOSIS — Z720 Tobacco use: Secondary | ICD-10-CM | POA: Insufficient documentation

## 2015-04-06 DIAGNOSIS — F319 Bipolar disorder, unspecified: Secondary | ICD-10-CM | POA: Diagnosis not present

## 2015-04-06 DIAGNOSIS — G40909 Epilepsy, unspecified, not intractable, without status epilepticus: Secondary | ICD-10-CM | POA: Insufficient documentation

## 2015-04-06 DIAGNOSIS — R0789 Other chest pain: Secondary | ICD-10-CM | POA: Diagnosis not present

## 2015-04-06 LAB — CBC WITH DIFFERENTIAL/PLATELET
BASOS PCT: 0 % (ref 0–1)
Basophils Absolute: 0 10*3/uL (ref 0.0–0.1)
EOS ABS: 0.1 10*3/uL (ref 0.0–0.7)
Eosinophils Relative: 2 % (ref 0–5)
HCT: 38.2 % — ABNORMAL LOW (ref 39.0–52.0)
Hemoglobin: 12.9 g/dL — ABNORMAL LOW (ref 13.0–17.0)
Lymphocytes Relative: 22 % (ref 12–46)
Lymphs Abs: 1.9 10*3/uL (ref 0.7–4.0)
MCH: 31.7 pg (ref 26.0–34.0)
MCHC: 33.8 g/dL (ref 30.0–36.0)
MCV: 93.9 fL (ref 78.0–100.0)
MONO ABS: 0.6 10*3/uL (ref 0.1–1.0)
Monocytes Relative: 7 % (ref 3–12)
Neutro Abs: 5.9 10*3/uL (ref 1.7–7.7)
Neutrophils Relative %: 69 % (ref 43–77)
Platelets: 244 10*3/uL (ref 150–400)
RBC: 4.07 MIL/uL — ABNORMAL LOW (ref 4.22–5.81)
RDW: 15.9 % — ABNORMAL HIGH (ref 11.5–15.5)
WBC: 8.6 10*3/uL (ref 4.0–10.5)

## 2015-04-06 LAB — BASIC METABOLIC PANEL
Anion gap: 8 (ref 5–15)
BUN: 8 mg/dL (ref 6–20)
CHLORIDE: 103 mmol/L (ref 101–111)
CO2: 22 mmol/L (ref 22–32)
CREATININE: 0.79 mg/dL (ref 0.61–1.24)
Calcium: 9.1 mg/dL (ref 8.9–10.3)
GFR calc Af Amer: >60 mL/min (ref >60)
GFR calc non Af Amer: >60 mL/min (ref >60)
GLUCOSE: 95 mg/dL (ref 65–99)
Potassium: 3.8 mmol/L (ref 3.5–5.1)
Sodium: 133 mmol/L — ABNORMAL LOW (ref 135–145)

## 2015-04-06 LAB — D-DIMER, QUANTITATIVE: D-Dimer, Quant: <0.27 ug/mL-FEU (ref 0.00–0.48)

## 2015-04-06 LAB — TROPONIN I: Troponin I: <0.03 ng/mL (ref <0.031)

## 2015-04-06 MED ORDER — NITROGLYCERIN 2 % TD OINT
1.0000 [in_us] | TOPICAL_OINTMENT | Freq: Once | TRANSDERMAL | Status: AC
  Administered 2015-04-06: 1 [in_us] via TOPICAL
  Filled 2015-04-06: qty 1

## 2015-04-06 MED ORDER — KETOROLAC TROMETHAMINE 30 MG/ML IJ SOLN
30.0000 mg | Freq: Once | INTRAMUSCULAR | Status: AC
  Administered 2015-04-06: 30 mg via INTRAVENOUS
  Filled 2015-04-06: qty 1

## 2015-04-06 MED ORDER — HYDROCODONE-ACETAMINOPHEN 5-325 MG PO TABS: 2.0000 | ORAL_TABLET | ORAL | Status: DC | PRN
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
## 2015-04-06 NOTE — ED Notes (Signed)
[facility].

## 2015-04-06 NOTE — ED Notes (Signed)
Breakfast tray given. °

## 2015-04-06 NOTE — ED Provider Notes (Signed)
Patient seen and evaluated. Care assumed from Dr. Wilkie AyeHorton. Patient is resting. Remains pain free. Repeat troponin is normal. Chest x-ray shows no recurrence of pneumothorax. No infiltrates or effusion. Old left rib fractures. With deep breathing or movement he still complains of some left-sided discomfort. Think his pain is chest wall related. He states he is taking a proton pump inhibitor. Plan is discharge home. Limited number prescription for #6 Vicodin as needed.  Rolland PorterMark Bular Hickok, MD 04/06/15 [phone]

## 2015-04-06 NOTE — ED Notes (Signed)
RN spoke with Adela LankJacqueline at [facility]. Discharge instructions given. They are to send transport for patient. Awaiting [facility] staff to pick patient up at this time.

## 2015-04-06 NOTE — ED Notes (Signed)
NITRO PASTE REMOVED PER ORDERS FROM DR. HORTON

## 2015-04-06 NOTE — ED Notes (Signed)
Breakfast tray ordered for patient per request. Still awaiting transport from [facility].

## 2015-04-06 NOTE — ED Notes (Addendum)
Chest pain x 2-3 days and worse when he takes a deep breath. [facility], was evaluated for chest pain and was sent home. No chest xray obtained. Pt has had pneumonia in the past. Pt c/o fever as well. Pt is from [facility]

## 2015-04-06 NOTE — ED Notes (Signed)
Coke given per request

## 2015-04-06 NOTE — ED Notes (Signed)
Patient wants to wait in waiting room for ride. Patient with no complaints at this time. Respirations even and unlabored. Skin warm/dry. Discharge instructions reviewed with patient at this time. Patient given opportunity to voice concerns/ask questions. IV removed per policy and band-aid applied to site. Patient discharged at this time and to waiting room to wait for ride.

## 2015-04-06 NOTE — Discharge Instructions (Signed)

## 2015-04-20 ENCOUNTER — Ambulatory Visit: Payer: Medicaid Other | Admitting: Gastroenterology

## 2015-05-02 ENCOUNTER — Encounter: Payer: Self-pay | Admitting: General Practice

## 2015-05-31 ENCOUNTER — Encounter (HOSPITAL_COMMUNITY): Payer: Self-pay | Admitting: Emergency Medicine

## 2015-05-31 ENCOUNTER — Emergency Department (HOSPITAL_COMMUNITY)
Admission: EM | Admit: 2015-05-31 | Discharge: 2015-05-31 | Discharge status: Against Medical Advice | Payer: Medicaid Other | Attending: Emergency Medicine | Admitting: Emergency Medicine

## 2015-05-31 ENCOUNTER — Emergency Department (HOSPITAL_COMMUNITY): Payer: Medicaid Other

## 2015-05-31 DIAGNOSIS — F259 Schizoaffective disorder, unspecified: Secondary | ICD-10-CM | POA: Insufficient documentation

## 2015-05-31 DIAGNOSIS — R51 Headache: Secondary | ICD-10-CM | POA: Diagnosis present

## 2015-05-31 DIAGNOSIS — R519 Headache, unspecified: Secondary | ICD-10-CM

## 2015-05-31 DIAGNOSIS — J449 Chronic obstructive pulmonary disease, unspecified: Secondary | ICD-10-CM | POA: Diagnosis not present

## 2015-05-31 DIAGNOSIS — Z8619 Personal history of other infectious and parasitic diseases: Secondary | ICD-10-CM | POA: Insufficient documentation

## 2015-05-31 DIAGNOSIS — Z872 Personal history of diseases of the skin and subcutaneous tissue: Secondary | ICD-10-CM | POA: Diagnosis not present

## 2015-05-31 DIAGNOSIS — K219 Gastro-esophageal reflux disease without esophagitis: Secondary | ICD-10-CM | POA: Diagnosis not present

## 2015-05-31 DIAGNOSIS — F450 Somatization disorder: Secondary | ICD-10-CM | POA: Insufficient documentation

## 2015-05-31 DIAGNOSIS — F411 Generalized anxiety disorder: Secondary | ICD-10-CM | POA: Insufficient documentation

## 2015-05-31 DIAGNOSIS — R1030 Lower abdominal pain, unspecified: Secondary | ICD-10-CM

## 2015-05-31 DIAGNOSIS — F319 Bipolar disorder, unspecified: Secondary | ICD-10-CM | POA: Diagnosis not present

## 2015-05-31 DIAGNOSIS — R112 Nausea with vomiting, unspecified: Secondary | ICD-10-CM | POA: Diagnosis not present

## 2015-05-31 DIAGNOSIS — G40909 Epilepsy, unspecified, not intractable, without status epilepticus: Secondary | ICD-10-CM | POA: Diagnosis not present

## 2015-05-31 DIAGNOSIS — Z79899 Other long term (current) drug therapy: Secondary | ICD-10-CM | POA: Insufficient documentation

## 2015-05-31 DIAGNOSIS — Z7951 Long term (current) use of inhaled steroids: Secondary | ICD-10-CM | POA: Diagnosis not present

## 2015-05-31 DIAGNOSIS — I10 Essential (primary) hypertension: Secondary | ICD-10-CM | POA: Insufficient documentation

## 2015-05-31 DIAGNOSIS — R6889 Other general symptoms and signs: Secondary | ICD-10-CM

## 2015-05-31 DIAGNOSIS — Z880 Allergy status to penicillin: Secondary | ICD-10-CM | POA: Insufficient documentation

## 2015-05-31 LAB — URINALYSIS, ROUTINE W REFLEX MICROSCOPIC
BILIRUBIN URINE: NEGATIVE
Glucose, UA: NEGATIVE mg/dL
Ketones, ur: NEGATIVE mg/dL
Leukocytes, UA: NEGATIVE
NITRITE: NEGATIVE
PH: 6 (ref 5.0–8.0)
Protein, ur: NEGATIVE mg/dL
Urobilinogen, UA: 0.2 mg/dL (ref 0.0–1.0)

## 2015-05-31 LAB — BASIC METABOLIC PANEL
ANION GAP: 7 (ref 5–15)
BUN: 9 mg/dL (ref 6–20)
CHLORIDE: 104 mmol/L (ref 101–111)
CO2: 22 mmol/L (ref 22–32)
Calcium: 8.7 mg/dL — ABNORMAL LOW (ref 8.9–10.3)
Creatinine, Ser: 0.82 mg/dL (ref 0.61–1.24)
GFR calc Af Amer: >60 mL/min (ref >60)
GFR calc non Af Amer: >60 mL/min (ref >60)
Glucose, Bld: 105 mg/dL — ABNORMAL HIGH (ref 65–99)
Potassium: 3.3 mmol/L — ABNORMAL LOW (ref 3.5–5.1)
SODIUM: 133 mmol/L — AB (ref 135–145)

## 2015-05-31 LAB — URINE MICROSCOPIC-ADD ON

## 2015-05-31 LAB — CBC WITH DIFFERENTIAL/PLATELET
BASOS PCT: 0 % (ref 0–1)
Basophils Absolute: 0.1 10*3/uL (ref 0.0–0.1)
Eosinophils Absolute: 0.2 10*3/uL (ref 0.0–0.7)
Eosinophils Relative: 1 % (ref 0–5)
HCT: 40.7 % (ref 39.0–52.0)
Hemoglobin: 14 g/dL (ref 13.0–17.0)
Lymphocytes Relative: 25 % (ref 12–46)
Lymphs Abs: 3.3 10*3/uL (ref 0.7–4.0)
MCH: 31.8 pg (ref 26.0–34.0)
MCHC: 34.4 g/dL (ref 30.0–36.0)
MCV: 92.5 fL (ref 78.0–100.0)
MONOS PCT: 7 % (ref 3–12)
Monocytes Absolute: 0.9 10*3/uL (ref 0.1–1.0)
Neutro Abs: 8.6 10*3/uL — ABNORMAL HIGH (ref 1.7–7.7)
Neutrophils Relative %: 67 % (ref 43–77)
Platelets: 210 10*3/uL (ref 150–400)
RBC: 4.4 MIL/uL (ref 4.22–5.81)
RDW: 13.7 % (ref 11.5–15.5)
WBC: 13 10*3/uL — ABNORMAL HIGH (ref 4.0–10.5)

## 2015-05-31 MED ORDER — SODIUM CHLORIDE 0.9 % IV BOLUS (SEPSIS)
1000.0000 mL | Freq: Once | INTRAVENOUS | Status: AC
  Administered 2015-05-31: 1000 mL via INTRAVENOUS

## 2015-05-31 MED ORDER — ACETAMINOPHEN 500 MG PO TABS
1000.0000 mg | ORAL_TABLET | Freq: Once | ORAL | Status: AC
  Administered 2015-05-31: 1000 mg via ORAL
  Filled 2015-05-31: qty 2

## 2015-05-31 NOTE — ED Notes (Signed)
Patient refused to continue with treatment and asked to discharge home, Dr. Wilkie AyeHorton spoke to patient trying to get him to stay for treatment and the patient refused.

## 2015-05-31 NOTE — ED Notes (Signed)
Called [facility] to informed them patient was leaving AMA and needed a ride back to facility, that stated they didn't have anyone able to pick him up and could he stay until the morning, we told them no.  Facility is going to try and find someone to pick up patient, patient ambulated to ED waiting room to wait for ride.
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
## 2015-05-31 NOTE — ED Notes (Signed)
Patient has c/o of headache and groin pain x 2 days, per patient vomited up blood on yesterday.
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
## 2015-06-21 ENCOUNTER — Ambulatory Visit: Payer: Medicaid Other | Admitting: Gastroenterology

## 2015-07-14 ENCOUNTER — Encounter: Payer: Self-pay | Admitting: Gastroenterology

## 2015-07-14 ENCOUNTER — Telehealth: Payer: Self-pay | Admitting: Gastroenterology

## 2015-07-14 ENCOUNTER — Ambulatory Visit: Payer: Medicaid Other | Admitting: Gastroenterology

## 2015-07-14 NOTE — Telephone Encounter (Signed)
PATIENT WAS A NO SHOW AND LETTER SENT  °

## 2015-08-22 ENCOUNTER — Telehealth: Payer: Self-pay | Admitting: Gastroenterology

## 2015-08-22 ENCOUNTER — Encounter: Payer: Self-pay | Admitting: Gastroenterology

## 2015-08-22 ENCOUNTER — Ambulatory Visit: Payer: Medicaid Other | Admitting: Gastroenterology

## 2015-08-22 NOTE — Telephone Encounter (Signed)
PATIENT WAS A NO SHOW AND LETTER SENT  °

## 2015-11-15 ENCOUNTER — Encounter (HOSPITAL_COMMUNITY): Payer: Self-pay

## 2015-11-15 ENCOUNTER — Emergency Department (HOSPITAL_COMMUNITY)
Admission: EM | Admit: 2015-11-15 | Discharge: 2015-11-16 | Disposition: A | Discharge status: Home Health | Payer: Medicaid Other | Attending: Emergency Medicine | Admitting: Emergency Medicine

## 2015-11-15 DIAGNOSIS — Z8619 Personal history of other infectious and parasitic diseases: Secondary | ICD-10-CM | POA: Insufficient documentation

## 2015-11-15 DIAGNOSIS — Z872 Personal history of diseases of the skin and subcutaneous tissue: Secondary | ICD-10-CM | POA: Diagnosis not present

## 2015-11-15 DIAGNOSIS — R45851 Suicidal ideations: Secondary | ICD-10-CM | POA: Insufficient documentation

## 2015-11-15 DIAGNOSIS — J449 Chronic obstructive pulmonary disease, unspecified: Secondary | ICD-10-CM | POA: Diagnosis not present

## 2015-11-15 DIAGNOSIS — Z79899 Other long term (current) drug therapy: Secondary | ICD-10-CM | POA: Insufficient documentation

## 2015-11-15 DIAGNOSIS — K219 Gastro-esophageal reflux disease without esophagitis: Secondary | ICD-10-CM | POA: Diagnosis not present

## 2015-11-15 DIAGNOSIS — F419 Anxiety disorder, unspecified: Secondary | ICD-10-CM | POA: Insufficient documentation

## 2015-11-15 DIAGNOSIS — F259 Schizoaffective disorder, unspecified: Secondary | ICD-10-CM | POA: Diagnosis present

## 2015-11-15 DIAGNOSIS — Z008 Encounter for other general examination: Secondary | ICD-10-CM | POA: Diagnosis present

## 2015-11-15 DIAGNOSIS — F3130 Bipolar disorder, current episode depressed, mild or moderate severity, unspecified: Secondary | ICD-10-CM | POA: Diagnosis not present

## 2015-11-15 DIAGNOSIS — G40909 Epilepsy, unspecified, not intractable, without status epilepticus: Secondary | ICD-10-CM | POA: Diagnosis not present

## 2015-11-15 DIAGNOSIS — I10 Essential (primary) hypertension: Secondary | ICD-10-CM | POA: Diagnosis not present

## 2015-11-15 DIAGNOSIS — Z880 Allergy status to penicillin: Secondary | ICD-10-CM | POA: Diagnosis not present

## 2015-11-15 DIAGNOSIS — F17210 Nicotine dependence, cigarettes, uncomplicated: Secondary | ICD-10-CM | POA: Insufficient documentation

## 2015-11-15 LAB — COMPREHENSIVE METABOLIC PANEL
ALBUMIN: 4.2 g/dL (ref 3.5–5.0)
ALK PHOS: 68 U/L (ref 38–126)
ALT: 14 U/L — ABNORMAL LOW (ref 17–63)
AST: 15 U/L (ref 15–41)
Anion gap: 11 (ref 5–15)
BUN: 14 mg/dL (ref 6–20)
CO2: 21 mmol/L — ABNORMAL LOW (ref 22–32)
Calcium: 9.5 mg/dL (ref 8.9–10.3)
Chloride: 104 mmol/L (ref 101–111)
Creatinine, Ser: 0.89 mg/dL (ref 0.61–1.24)
GFR calc Af Amer: >60 mL/min (ref >60)
GFR calc non Af Amer: >60 mL/min (ref >60)
GLUCOSE: 111 mg/dL — AB (ref 65–99)
POTASSIUM: 3.2 mmol/L — AB (ref 3.5–5.1)
Sodium: 136 mmol/L (ref 135–145)
TOTAL PROTEIN: 6.6 g/dL (ref 6.5–8.1)
Total Bilirubin: 0.8 mg/dL (ref 0.3–1.2)

## 2015-11-15 LAB — CBC WITH DIFFERENTIAL/PLATELET
Basophils Absolute: 0 10*3/uL (ref 0.0–0.1)
Basophils Relative: 0 %
Eosinophils Absolute: 0.2 10*3/uL (ref 0.0–0.7)
Eosinophils Relative: 2 %
HEMATOCRIT: 44 % (ref 39.0–52.0)
Hemoglobin: 15.4 g/dL (ref 13.0–17.0)
LYMPHS ABS: 3.1 10*3/uL (ref 0.7–4.0)
Lymphocytes Relative: 33 %
MCH: 32.8 pg (ref 26.0–34.0)
MCHC: 35 g/dL (ref 30.0–36.0)
MCV: 93.8 fL (ref 78.0–100.0)
MONO ABS: 0.5 10*3/uL (ref 0.1–1.0)
MONOS PCT: 6 %
NEUTROS ABS: 5.4 10*3/uL (ref 1.7–7.7)
Neutrophils Relative %: 59 %
Platelets: 195 10*3/uL (ref 150–400)
RBC: 4.69 MIL/uL (ref 4.22–5.81)
RDW: 13.5 % (ref 11.5–15.5)
WBC: 9.2 10*3/uL (ref 4.0–10.5)

## 2015-11-15 LAB — RAPID URINE DRUG SCREEN, HOSP PERFORMED
Amphetamines: NOT DETECTED
BARBITURATES: NOT DETECTED
Benzodiazepines: NOT DETECTED
Cocaine: NOT DETECTED
Opiates: NOT DETECTED
Tetrahydrocannabinol: NOT DETECTED

## 2015-11-15 LAB — LITHIUM LEVEL: Lithium Lvl: 0.39 mmol/L — ABNORMAL LOW (ref 0.60–1.20)

## 2015-11-15 LAB — ETHANOL: Alcohol, Ethyl (B): <5 mg/dL (ref <5)

## 2015-11-15 MED ORDER — LATANOPROST 0.005 % OP SOLN
1.0000 [drp] | Freq: Every day | OPHTHALMIC | Status: DC
  Filled 2015-11-15: qty 2.5

## 2015-11-15 MED ORDER — BUDESONIDE-FORMOTEROL FUMARATE 160-4.5 MCG/ACT IN AERO
2.0000 | INHALATION_SPRAY | Freq: Two times a day (BID) | RESPIRATORY_TRACT | Status: DC
  Administered 2015-11-16: 2 via RESPIRATORY_TRACT
  Filled 2015-11-15: qty 6

## 2015-11-15 MED ORDER — TIZANIDINE HCL 4 MG PO TABS
6.0000 mg | ORAL_TABLET | Freq: Three times a day (TID) | ORAL | Status: DC
  Filled 2015-11-15 (×4): qty 1

## 2015-11-15 MED ORDER — TAMSULOSIN HCL 0.4 MG PO CAPS
0.4000 mg | ORAL_CAPSULE | Freq: Every day | ORAL | Status: DC
  Administered 2015-11-16: 0.4 mg via ORAL
  Filled 2015-11-15 (×2): qty 1

## 2015-11-15 MED ORDER — CLONIDINE HCL 0.1 MG PO TABS
0.1000 mg | ORAL_TABLET | Freq: Two times a day (BID) | ORAL | Status: DC
  Administered 2015-11-16: 0.1 mg via ORAL
  Filled 2015-11-15 (×2): qty 1

## 2015-11-15 MED ORDER — ALBUTEROL SULFATE HFA 108 (90 BASE) MCG/ACT IN AERS
1.0000 | INHALATION_SPRAY | Freq: Four times a day (QID) | RESPIRATORY_TRACT | Status: DC
  Administered 2015-11-16: 1 via RESPIRATORY_TRACT
  Filled 2015-11-15: qty 6.7

## 2015-11-15 MED ORDER — CLONAZEPAM 0.5 MG PO TABS
0.5000 mg | ORAL_TABLET | Freq: Two times a day (BID) | ORAL | Status: DC
  Administered 2015-11-16: 0.5 mg via ORAL
  Filled 2015-11-15 (×3): qty 1

## 2015-11-15 MED ORDER — TIOTROPIUM BROMIDE MONOHYDRATE 18 MCG IN CAPS
18.0000 ug | ORAL_CAPSULE | Freq: Every day | RESPIRATORY_TRACT | Status: DC
  Administered 2015-11-16: 18 ug via RESPIRATORY_TRACT
  Filled 2015-11-15: qty 5

## 2015-11-15 MED ORDER — PANTOPRAZOLE SODIUM 40 MG PO TBEC
40.0000 mg | DELAYED_RELEASE_TABLET | Freq: Two times a day (BID) | ORAL | Status: DC
  Administered 2015-11-16: 40 mg via ORAL
  Filled 2015-11-15: qty 1

## 2015-11-15 MED ORDER — TOPIRAMATE 25 MG PO TABS
50.0000 mg | ORAL_TABLET | Freq: Two times a day (BID) | ORAL | Status: DC
  Administered 2015-11-16: 50 mg via ORAL
  Filled 2015-11-15 (×3): qty 2

## 2015-11-15 MED ORDER — LITHIUM CARBONATE ER 450 MG PO TBCR
1200.0000 mg | EXTENDED_RELEASE_TABLET | Freq: Every day | ORAL | Status: DC
  Filled 2015-11-15: qty 1

## 2015-11-15 MED ORDER — PREGABALIN 50 MG PO CAPS: 100.0000 mg | ORAL_CAPSULE | Freq: Every day | ORAL | Status: DC

## 2015-11-15 MED ORDER — LACTULOSE 10 GM/15ML PO SOLN
10.0000 g | Freq: Every day | ORAL | Status: DC
  Administered 2015-11-16: 10 g via ORAL
  Filled 2015-11-15: qty 30
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
## 2015-11-15 NOTE — ED Notes (Signed)
After opening medication patient refused
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
## 2015-11-16 DIAGNOSIS — F259 Schizoaffective disorder, unspecified: Secondary | ICD-10-CM

## 2015-11-16 DIAGNOSIS — R45851 Suicidal ideations: Secondary | ICD-10-CM | POA: Insufficient documentation

## 2015-11-16 MED ORDER — TIZANIDINE HCL 4 MG PO TABS
6.0000 mg | ORAL_TABLET | Freq: Three times a day (TID) | ORAL | Status: DC
  Administered 2015-11-16: 6 mg via ORAL
  Filled 2015-11-16 (×4): qty 2

## 2015-11-16 NOTE — ED Notes (Signed)
Facility has a driver to come get this patient.
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
## 2015-11-16 NOTE — ED Notes (Signed)
Patient ambulatory to restoom

## 2015-11-16 NOTE — ED Notes (Signed)
Pt's guardian is through [facility] in [place]. The managing partner is ImmunologistCassandra Massenger. The pt's guardian is Jason OdorVanda Patterson, although the pt has not seen her. The number is [phone]

## 2015-11-16 NOTE — ED Notes (Signed)
Facility called, states to call back in 20 minutes to see about a ride time for patient.

## 2015-11-16 NOTE — ED Notes (Signed)
Patient ambulatory to restroom  ?

## 2015-11-16 NOTE — ED Notes (Signed)
Pt discharged by charge nurse to waiting room. Pt ambulatory upon discharge.

## 2015-11-16 NOTE — Progress Notes (Signed)
Attempted to reach pt's guardian ([employer]- Hilbert OdorVanda Thomas) at [phone]- ex# [phone]- left voicemail. Spoke with NP who evaluated pt this morning and recommends he does not warrant inpatient admission. Will communicate this to guardian upon returned call.   Ilean SkillMeghan Jonisha Kindig, MSW, LCSW Clinical Social Work, Disposition  11/16/2015 [phone]

## 2015-11-17 NOTE — Progress Notes (Signed)
Received call back from Hazle CocaVonda Thomas- [employer] [phone] ex# [phone]. She states pt is not pursuing alternate placement- has been at current ALF about 3-4 weeks and she is not interested in other placement at this time. States he has a hx of making threats to harm self and others "when he does not get his way," and then quickly stabilizing. Reports he has hx of behaviors such as throwing urine on others. No "serious attempts" at harm known per guardian. States she suspects current incident could be triggered by pt being upset about financial situation which is unresolved from last group home placement (pt alluded to this as well- see assessment).  She notes his historical dx are: Schizoaffective d/o GERD Psychotic features   Guardian states she will be in contact with pt and ALF today in order to clarify that she is not in process of moving pt out of current living environment.   Ilean SkillMeghan Gillie Crisci, MSW, LCSW Clinical Social Work, Disposition  11/17/2015 [phone]

## 2016-01-07 ENCOUNTER — Encounter: Payer: Self-pay | Admitting: Emergency Medicine

## 2016-01-07 ENCOUNTER — Emergency Department
Admission: EM | Admit: 2016-01-07 | Discharge: 2016-01-08 | Disposition: A | Discharge status: Home / Self Care | Payer: Medicaid Other | Attending: Emergency Medicine | Admitting: Emergency Medicine

## 2016-01-07 DIAGNOSIS — Z79899 Other long term (current) drug therapy: Secondary | ICD-10-CM | POA: Diagnosis not present

## 2016-01-07 DIAGNOSIS — F251 Schizoaffective disorder, depressive type: Secondary | ICD-10-CM | POA: Diagnosis not present

## 2016-01-07 DIAGNOSIS — R45851 Suicidal ideations: Secondary | ICD-10-CM | POA: Diagnosis not present

## 2016-01-07 DIAGNOSIS — Z008 Encounter for other general examination: Secondary | ICD-10-CM | POA: Diagnosis present

## 2016-01-07 DIAGNOSIS — R251 Tremor, unspecified: Secondary | ICD-10-CM | POA: Diagnosis not present

## 2016-01-07 DIAGNOSIS — F17210 Nicotine dependence, cigarettes, uncomplicated: Secondary | ICD-10-CM | POA: Insufficient documentation

## 2016-01-07 DIAGNOSIS — I10 Essential (primary) hypertension: Secondary | ICD-10-CM | POA: Insufficient documentation

## 2016-01-07 DIAGNOSIS — Z880 Allergy status to penicillin: Secondary | ICD-10-CM | POA: Insufficient documentation

## 2016-01-07 DIAGNOSIS — F259 Schizoaffective disorder, unspecified: Secondary | ICD-10-CM | POA: Diagnosis present

## 2016-01-07 LAB — COMPREHENSIVE METABOLIC PANEL
ALBUMIN: 4.7 g/dL (ref 3.5–5.0)
ALT: 17 U/L (ref 17–63)
ANION GAP: 7 (ref 5–15)
AST: 21 U/L (ref 15–41)
Alkaline Phosphatase: 80 U/L (ref 38–126)
BUN: 13 mg/dL (ref 6–20)
CO2: 25 mmol/L (ref 22–32)
Calcium: 9.6 mg/dL (ref 8.9–10.3)
Chloride: 102 mmol/L (ref 101–111)
Creatinine, Ser: 0.99 mg/dL (ref 0.61–1.24)
GFR calc Af Amer: >60 mL/min (ref >60)
GFR calc non Af Amer: >60 mL/min (ref >60)
GLUCOSE: 102 mg/dL — AB (ref 65–99)
POTASSIUM: 3.7 mmol/L (ref 3.5–5.1)
SODIUM: 134 mmol/L — AB (ref 135–145)
Total Bilirubin: 0.5 mg/dL (ref 0.3–1.2)
Total Protein: 7.3 g/dL (ref 6.5–8.1)

## 2016-01-07 LAB — CBC
HCT: 46.1 % (ref 40.0–52.0)
HEMOGLOBIN: 15.8 g/dL (ref 13.0–18.0)
MCH: 31.9 pg (ref 26.0–34.0)
MCHC: 34.3 g/dL (ref 32.0–36.0)
MCV: 93.1 fL (ref 80.0–100.0)
Platelets: 194 10*3/uL (ref 150–440)
RBC: 4.96 MIL/uL (ref 4.40–5.90)
RDW: 14.3 % (ref 11.5–14.5)
WBC: 10.5 10*3/uL (ref 3.8–10.6)

## 2016-01-07 LAB — ETHANOL: Alcohol, Ethyl (B): <5 mg/dL (ref <5)

## 2016-01-07 LAB — SALICYLATE LEVEL: Salicylate Lvl: <4 mg/dL (ref 2.8–30.0)

## 2016-01-07 LAB — ACETAMINOPHEN LEVEL

## 2016-01-07 MED ORDER — OLANZAPINE 5 MG PO TABS
2.5000 mg | ORAL_TABLET | Freq: Two times a day (BID) | ORAL | Status: DC
  Administered 2016-01-08: 2.5 mg via ORAL
  Filled 2016-01-07 (×2): qty 1

## 2016-01-07 NOTE — ED Notes (Signed)
Pt changed from voluntary to ivc with q 15 min checks
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
## 2016-01-07 NOTE — ED Notes (Signed)
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
## 2016-01-07 NOTE — ED Notes (Signed)
Recommendation is for pt to be admitted as inpatient - ivc is to remain

## 2016-01-07 NOTE — BH Assessment (Signed)
Assessment Completed. Consulted with Alveda Reasons, Nurse Practitioner Denice Bors), who discussed patient with Dr.  Lynnae Sandhoff (Psychiatrist), and recommends Outpatient Treatment. Patient can discharge back to Group Home and follow up with his ACTT Provider Producer, television/film/video).  ER MD Fanny Bien) informed of Recommendation.

## 2016-01-07 NOTE — BH Assessment (Signed)
Spoke to [facility] On Call Psychiatrist (Dr. A. Hernandez) about the patient and the recommendation of [facility] Nurse Practitioner(Discharge) and the recommendation of SOC (Admit). Writer was advised to have patient remain in the ER overnight and they will be seen tomorrow (01/08/2016), due to "patient not meeting admission criteria and there is no one to rescind the IVC." 

## 2016-01-07 NOTE — ED Notes (Signed)
Pt noted to have continuous run on sentences that are not coherent and is not a good historian.

## 2016-01-07 NOTE — ED Notes (Signed)
BEHAVIORAL HEALTH ROUNDING Patient sleeping: No. Patient alert and oriented: yes Behavior appropriate: Yes.  ; If no, describe:  Nutrition and fluids offered: Yes  Toileting and hygiene offered: Yes  Sitter present: no Law enforcement present: Yes  

## 2016-01-08 DIAGNOSIS — F259 Schizoaffective disorder, unspecified: Secondary | ICD-10-CM

## 2016-01-08 NOTE — Progress Notes (Signed)
LCSW called patients guardian and stated patient is ready for discharge. She provided group home number  Rosalio Loud [phone] He will be sending someone to pick up patient shortly.  [employer] LCSW [phone]
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
## 2016-01-08 NOTE — Discharge Instructions (Signed)
Schizoaffective Disorder  Schizoaffective disorder (ScAD) is a mental illness. It causes symptoms that are a mixture of schizophrenia (a psychotic disorder) and an affective (mood) disorder. The schizophrenic symptoms may include delusions, hallucinations, or odd behavior. The mood symptoms may be similar to major depression or bipolar disorder. ScAD may interfere with personal relationships or normal daily activities. People with ScAD are at increased risk for job loss, social isolation, physical health problems, anxiety and substance use disorders, and suicide.  ScAD usually occurs in cycles. Periods of severe symptoms are followed by periods of  less severe symptoms or improvement. The illness affects men and women equally but usually appears at an earlier age (teenage or early adult years) in men. People who have family members with schizophrenia, bipolar disorder, or ScAD are at higher risk of developing ScAD.  SYMPTOMS   At any one time, people with ScAD may have psychotic symptoms only or both psychotic and mood symptoms. The psychotic symptoms include one or more of the following:  · Hearing, seeing, or feeling things that are not there (hallucinations).    · Having fixed, false beliefs (delusions). The delusions usually are of being attacked, harassed, cheated, persecuted, or conspired against (paranoid delusions).  · Speaking in a way that makes no sense to others (disorganized speech).  The psychotic symptoms of ScAD may also include confusing or odd behavior or any of the negative symptoms of schizophrenia. These include loss of motivation for normal daily activities, such as bathing or grooming, withdrawal from other people, and lack of emotions.     The mood symptoms of ScAD occur more often than not. They resemble major depressive disorder or bipolar mania. Symptoms of major depression include depressed mood and four or more of the following:  · Loss of interest in usually pleasurable activities  (anhedonia).  · Sleeping more or less than normal.  · Feeling worthless or excessively guilty.  · Lack of energy or motivation.  · Trouble concentrating.  · Eating more or less than usual.  · Thinking a lot about death or suicide.  Symptoms of bipolar mania include abnormally elevated or irritable mood and increased energy or activity, plus three or more of the following:    · More confidence than normal or feeling that you are able to do anything (grandiosity).  · Feeling rested with less sleep than normal.    · Being easily distracted.    · Talking more than usual or feeling pressured to keep talking.    · Feeling that your thoughts are racing.  · Engaging in high-risk activities such as buying sprees or foolish business decisions.  DIAGNOSIS   ScAD is diagnosed through an assessment by your health care provider. Your health care provider will observe and ask questions about your thoughts, behavior, mood, and ability to function in daily life. Your health care provider may also ask questions about your medical history and use of drugs, including prescription medicines. Your health care provider may also order blood tests and imaging exams. Certain medical conditions and substances can cause symptoms that resemble ScAD. Your health care provider may refer you to a mental health specialist for evaluation.   ScAD is divided into two types. The depressive type is diagnosed if your mood symptoms are limited to major depression. The bipolar type is diagnosed if your mood symptoms are manic or a mixture of manic and depressive symptoms  TREATMENT   ScAD is usually a lifelong illness. Long-term treatment is necessary. The following treatments are available:  · Medicine. Different types of   medicine are used to treat ScAD. The exact combination depends on the type and severity of your symptoms. Antipsychotic medicine is used to control psychotic symptoms such as delusions, paranoia, and hallucinations. Mood stabilizers can  even the highs and lows of bipolar manic mood swings. Antidepressant medicines are used to treat major depressive symptoms.  · Counseling or talk therapy. Individual, group, or family counseling may be helpful in providing education, support, and guidance. Many people with ScAD also benefit from social skills and job skills (vocational) training.  A combination of medicine and counseling is usually best for managing the disorder over time. A procedure in which electricity is applied to the brain through the scalp (electroconvulsive therapy) may be used to treat people with severe manic symptoms that do not respond to medicine and counseling.  HOME CARE INSTRUCTIONS   · Take all your medicine as prescribed.  · Check with your health care provider before starting new prescription or over-the-counter medicines.  · Keep all follow up appointments with your health care provider.  SEEK MEDICAL CARE IF:   · If you are not able to take your medicines as prescribed.  · If your symptoms get worse.  SEEK IMMEDIATE MEDICAL CARE IF:   · You have serious thoughts about hurting yourself or others.     This information is not intended to replace advice given to you by your health care provider. Make sure you discuss any questions you have with your health care provider.     Document Released: 03/17/2007 Document Revised: 11/25/2014 Document Reviewed: 06/18/2013  Elsevier Interactive Patient Education ©2016 [employer]

## 2016-01-08 NOTE — ED Provider Notes (Signed)
-----------------------------------------   7:43 AM on 01/08/2016 -----------------------------------------   Blood pressure 117/67, pulse 67, temperature 97.8 F (36.6 C), temperature source Oral, resp. rate 20, height  (1.702 m), weight 81.194 kg, SpO2 96 %.  The patient had no acute events since last update.  Calm and cooperative at this time.  Patient is awaiting in person reevaluation by psychiatry to determine disposition.   Loleta Rose, MD 01/08/16 [phone]

## 2016-01-08 NOTE — ED Provider Notes (Signed)
-----------------------------------------   12:38 PM on 01/08/2016 -----------------------------------------  Patient evaluated by Dr. Mat Carne packs psychiatrist on call with recommendation for discharge. Patient denies suicidal ideation at this time  Darci Current, MD 01/08/16 [phone]

## 2016-01-13 ENCOUNTER — Emergency Department
Admission: EM | Admit: 2016-01-13 | Discharge: 2016-01-13 | Disposition: A | Discharge status: Home / Self Care | Payer: Medicaid Other | Attending: Student | Admitting: Student

## 2016-01-13 ENCOUNTER — Emergency Department: Payer: Medicaid Other

## 2016-01-13 DIAGNOSIS — R1013 Epigastric pain: Secondary | ICD-10-CM | POA: Insufficient documentation

## 2016-01-13 DIAGNOSIS — R251 Tremor, unspecified: Secondary | ICD-10-CM | POA: Insufficient documentation

## 2016-01-13 DIAGNOSIS — F201 Disorganized schizophrenia: Secondary | ICD-10-CM | POA: Insufficient documentation

## 2016-01-13 DIAGNOSIS — R112 Nausea with vomiting, unspecified: Secondary | ICD-10-CM | POA: Insufficient documentation

## 2016-01-13 DIAGNOSIS — J441 Chronic obstructive pulmonary disease with (acute) exacerbation: Secondary | ICD-10-CM | POA: Insufficient documentation

## 2016-01-13 DIAGNOSIS — R109 Unspecified abdominal pain: Secondary | ICD-10-CM | POA: Insufficient documentation

## 2016-01-13 DIAGNOSIS — R4189 Other symptoms and signs involving cognitive functions and awareness: Secondary | ICD-10-CM

## 2016-01-13 DIAGNOSIS — F17210 Nicotine dependence, cigarettes, uncomplicated: Secondary | ICD-10-CM | POA: Diagnosis not present

## 2016-01-13 DIAGNOSIS — F259 Schizoaffective disorder, unspecified: Secondary | ICD-10-CM

## 2016-01-13 DIAGNOSIS — I10 Essential (primary) hypertension: Secondary | ICD-10-CM | POA: Diagnosis not present

## 2016-01-13 DIAGNOSIS — Z7951 Long term (current) use of inhaled steroids: Secondary | ICD-10-CM | POA: Diagnosis not present

## 2016-01-13 DIAGNOSIS — Z79899 Other long term (current) drug therapy: Secondary | ICD-10-CM | POA: Insufficient documentation

## 2016-01-13 DIAGNOSIS — R079 Chest pain, unspecified: Secondary | ICD-10-CM | POA: Insufficient documentation

## 2016-01-13 DIAGNOSIS — F419 Anxiety disorder, unspecified: Secondary | ICD-10-CM | POA: Diagnosis not present

## 2016-01-13 DIAGNOSIS — Z880 Allergy status to penicillin: Secondary | ICD-10-CM | POA: Diagnosis not present

## 2016-01-13 LAB — BASIC METABOLIC PANEL
ANION GAP: 10 (ref 5–15)
BUN: 12 mg/dL (ref 6–20)
CO2: 22 mmol/L (ref 22–32)
Calcium: 8.8 mg/dL — ABNORMAL LOW (ref 8.9–10.3)
Chloride: 104 mmol/L (ref 101–111)
Creatinine, Ser: 0.91 mg/dL (ref 0.61–1.24)
GFR calc Af Amer: >60 mL/min (ref >60)
GLUCOSE: 118 mg/dL — AB (ref 65–99)
POTASSIUM: 3.4 mmol/L — AB (ref 3.5–5.1)
Sodium: 136 mmol/L (ref 135–145)

## 2016-01-13 LAB — CBC
HEMATOCRIT: 41.9 % (ref 40.0–52.0)
HEMOGLOBIN: 14.8 g/dL (ref 13.0–18.0)
MCH: 32.4 pg (ref 26.0–34.0)
MCHC: 35.3 g/dL (ref 32.0–36.0)
MCV: 91.8 fL (ref 80.0–100.0)
Platelets: 195 10*3/uL (ref 150–440)
RBC: 4.56 MIL/uL (ref 4.40–5.90)
RDW: 13.9 % (ref 11.5–14.5)
WBC: 8.9 10*3/uL (ref 3.8–10.6)

## 2016-01-13 LAB — URINALYSIS COMPLETE WITH MICROSCOPIC (ARMC ONLY)
Bilirubin Urine: NEGATIVE
Glucose, UA: NEGATIVE mg/dL
LEUKOCYTES UA: NEGATIVE
Nitrite: NEGATIVE
PROTEIN: NEGATIVE mg/dL
SPECIFIC GRAVITY, URINE: 1.004 — AB (ref 1.005–1.030)
SQUAMOUS EPITHELIAL / LPF: NONE SEEN
pH: 7 (ref 5.0–8.0)

## 2016-01-13 LAB — URINE DRUG SCREEN, QUALITATIVE (ARMC ONLY)
Amphetamines, Ur Screen: NOT DETECTED
BENZODIAZEPINE, UR SCRN: NOT DETECTED
Barbiturates, Ur Screen: NOT DETECTED
CANNABINOID 50 NG, UR ~~LOC~~: NOT DETECTED
Cocaine Metabolite,Ur ~~LOC~~: NOT DETECTED
MDMA (Ecstasy)Ur Screen: NOT DETECTED
Methadone Scn, Ur: NOT DETECTED
Opiate, Ur Screen: NOT DETECTED
PHENCYCLIDINE (PCP) UR S: NOT DETECTED
TRICYCLIC, UR SCREEN: NOT DETECTED

## 2016-01-13 LAB — ETHANOL: Alcohol, Ethyl (B): <5 mg/dL (ref <5)

## 2016-01-13 LAB — SALICYLATE LEVEL: Salicylate Lvl: <4 mg/dL (ref 2.8–30.0)

## 2016-01-13 LAB — ACETAMINOPHEN LEVEL

## 2016-01-13 LAB — HEPATIC FUNCTION PANEL
ALK PHOS: 83 U/L (ref 38–126)
ALT: 23 U/L (ref 17–63)
AST: 39 U/L (ref 15–41)
Albumin: 4.5 g/dL (ref 3.5–5.0)
BILIRUBIN INDIRECT: 0.8 mg/dL (ref 0.3–0.9)
Bilirubin, Direct: 0.2 mg/dL (ref 0.1–0.5)
TOTAL PROTEIN: 7.1 g/dL (ref 6.5–8.1)
Total Bilirubin: 1 mg/dL (ref 0.3–1.2)

## 2016-01-13 LAB — TROPONIN I
Troponin I: 0.04 ng/mL — ABNORMAL HIGH (ref <0.031)
Troponin I: <0.03 ng/mL (ref <0.031)

## 2016-01-13 LAB — LITHIUM LEVEL

## 2016-01-13 LAB — LIPASE, BLOOD: Lipase: 16 U/L (ref 11–51)

## 2016-01-13 MED ORDER — CLONAZEPAM 0.5 MG PO TABS: 0.5000 mg | ORAL_TABLET | Freq: Two times a day (BID) | ORAL | Status: DC

## 2016-01-13 MED ORDER — TIZANIDINE HCL 4 MG PO TABS: 4.0000 mg | ORAL_TABLET | Freq: Three times a day (TID) | ORAL | Status: DC

## 2016-01-13 MED ORDER — TIOTROPIUM BROMIDE MONOHYDRATE 18 MCG IN CAPS
18.0000 ug | ORAL_CAPSULE | Freq: Every day | RESPIRATORY_TRACT | Status: DC
  Administered 2016-01-13: 18 ug via RESPIRATORY_TRACT
  Filled 2016-01-13: qty 5

## 2016-01-13 MED ORDER — PREGABALIN 50 MG PO CAPS: 50.0000 mg | ORAL_CAPSULE | Freq: Three times a day (TID) | ORAL | Status: DC

## 2016-01-13 MED ORDER — ALBUTEROL SULFATE HFA 108 (90 BASE) MCG/ACT IN AERS
1.0000 | INHALATION_SPRAY | Freq: Four times a day (QID) | RESPIRATORY_TRACT | Status: DC
  Administered 2016-01-13: 1 via RESPIRATORY_TRACT
  Filled 2016-01-13: qty 6.7

## 2016-01-13 MED ORDER — LACTULOSE 10 GM/15ML PO SOLN
10.0000 g | Freq: Every day | ORAL | Status: DC
  Administered 2016-01-13: 10 g via ORAL
  Filled 2016-01-13: qty 30

## 2016-01-13 MED ORDER — OLANZAPINE 5 MG PO TABS
5.0000 mg | ORAL_TABLET | Freq: Two times a day (BID) | ORAL | Status: DC
  Administered 2016-01-13: 5 mg via ORAL
  Filled 2016-01-13: qty 1

## 2016-01-13 MED ORDER — CLONIDINE HCL 0.1 MG PO TABS
0.1000 mg | ORAL_TABLET | Freq: Two times a day (BID) | ORAL | Status: DC
  Administered 2016-01-13: 0.1 mg via ORAL
  Filled 2016-01-13: qty 1

## 2016-01-13 MED ORDER — PANTOPRAZOLE SODIUM 40 MG PO TBEC: 40.0000 mg | DELAYED_RELEASE_TABLET | Freq: Two times a day (BID) | ORAL | Status: DC

## 2016-01-13 MED ORDER — TAMSULOSIN HCL 0.4 MG PO CAPS
0.4000 mg | ORAL_CAPSULE | Freq: Every day | ORAL | Status: DC
  Administered 2016-01-13: 0.4 mg via ORAL
  Filled 2016-01-13: qty 1

## 2016-01-13 MED ORDER — CLONAZEPAM 0.5 MG PO TABS
0.5000 mg | ORAL_TABLET | Freq: Once | ORAL | Status: AC
  Administered 2016-01-13: 0.5 mg via ORAL
  Filled 2016-01-13: qty 1

## 2016-01-13 MED ORDER — CLONAZEPAM 0.5 MG PO TABS
0.5000 mg | ORAL_TABLET | Freq: Two times a day (BID) | ORAL | Status: DC
  Administered 2016-01-13: 0.5 mg via ORAL
  Filled 2016-01-13: qty 1

## 2016-01-13 MED ORDER — OLANZAPINE 5 MG PO TABS: 5.0000 mg | ORAL_TABLET | Freq: Two times a day (BID) | ORAL | Status: DC

## 2016-01-13 MED ORDER — GABAPENTIN 600 MG PO TABS
300.0000 mg | ORAL_TABLET | Freq: Three times a day (TID) | ORAL | Status: DC
  Administered 2016-01-13: 300 mg via ORAL
  Filled 2016-01-13: qty 2

## 2016-01-13 MED ORDER — LATANOPROST 0.005 % OP SOLN
1.0000 [drp] | Freq: Every day | OPHTHALMIC | Status: DC
  Filled 2016-01-13: qty 2.5

## 2016-01-13 MED ORDER — MOMETASONE FURO-FORMOTEROL FUM 200-5 MCG/ACT IN AERO
2.0000 | INHALATION_SPRAY | Freq: Two times a day (BID) | RESPIRATORY_TRACT | Status: DC
  Administered 2016-01-13: 2 via RESPIRATORY_TRACT
  Filled 2016-01-13: qty 8.8

## 2016-01-13 MED ORDER — GABAPENTIN 600 MG PO TABS: 300.0000 mg | ORAL_TABLET | Freq: Three times a day (TID) | ORAL | Status: DC

## 2016-01-13 MED ORDER — ASPIRIN 81 MG PO CHEW
324.0000 mg | CHEWABLE_TABLET | Freq: Once | ORAL | Status: AC
  Administered 2016-01-13: 324 mg via ORAL
  Filled 2016-01-13: qty 4

## 2016-01-13 NOTE — ED Notes (Signed)
Pt waiting for [facility] consult. Pt resting, respirations even and unlabored. Pt given meal.
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
## 2016-01-13 NOTE — Progress Notes (Signed)
LCSW received call from Dr Dondra Spry. Patient is to placed in a suitable and safe housing. LCSW called last known address unable to reach anyone at that number. LCSW called Greggory Brandy and patients legal guardian, awaiting call back. [employer] LCSW  [phone]
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
## 2016-01-13 NOTE — ED Notes (Signed)
Pt resting with eyes closed. Respirations even and unlabored.

## 2016-01-13 NOTE — ED Notes (Addendum)
TTS at bedside. 

## 2016-01-13 NOTE — ED Notes (Signed)
SOC at bedside, pt talking to psychiatrist.
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
## 2016-01-13 NOTE — ED Notes (Signed)
Troponin 0.04. MD notified.  

## 2016-01-13 NOTE — ED Notes (Signed)
Social worker at bedside. Pt will be transported to a new group home. Pt is in agreement.

## 2016-01-13 NOTE — Progress Notes (Signed)
LCSW called [name] Massenburg and she provided this worker with patients current address and group home provider number. Patient is supported in the community with [employer]. [place] [facility] provider [place] [phone]. He stated the patient has been missing and they will send someone ASAP to pick up patient. He resides at [facility]. They will be here in 15 minutes. EDP notified

## 2016-01-13 NOTE — BH Assessment (Signed)
Writer consulted with the ER MD (Dr. Dondra Spry) regarding the patient.  Patient denies SI/HI/Psychosis/SA.  Per Dr. Dondra Spry the [facility] recommends a medication change and discharge.  Patient will be following up with social work regarding placement.

## 2016-01-13 NOTE — Progress Notes (Signed)
LCSW met with patient and discussed recent patient fears and worked through Higher education careers adviser. Patient was provided several short, easy to remember prompts to reduce his anxiety. Patient when upset or anxious will depart from situation and return to his room or to go to the back porch. LCSW agreed to follow up with Group home provided as soon as they came to get him. LCSW reviewed the situation and patients current fears with Ms Stanton Kidney [phone] Group home provided and spoke to patients other guardian Tedra Coupe- They will be placing him in their other facility either today or on Monday and that the employee Celesta Gentile will not be at this group home.  LCSW will call patient to confirm he has arrived safely. No further contact.  [employer] LCSW [phone]

## 2016-01-19 ENCOUNTER — Emergency Department: Payer: Medicaid Other

## 2016-01-19 ENCOUNTER — Encounter: Payer: Self-pay | Admitting: Emergency Medicine

## 2016-01-19 ENCOUNTER — Emergency Department
Admission: EM | Admit: 2016-01-19 | Discharge: 2016-01-20 | Disposition: A | Discharge status: Home / Self Care | Payer: Medicaid Other | Attending: Emergency Medicine | Admitting: Emergency Medicine

## 2016-01-19 DIAGNOSIS — F17210 Nicotine dependence, cigarettes, uncomplicated: Secondary | ICD-10-CM | POA: Diagnosis not present

## 2016-01-19 DIAGNOSIS — R079 Chest pain, unspecified: Secondary | ICD-10-CM | POA: Insufficient documentation

## 2016-01-19 DIAGNOSIS — I10 Essential (primary) hypertension: Secondary | ICD-10-CM | POA: Diagnosis not present

## 2016-01-19 DIAGNOSIS — Z880 Allergy status to penicillin: Secondary | ICD-10-CM | POA: Diagnosis not present

## 2016-01-19 DIAGNOSIS — Z79899 Other long term (current) drug therapy: Secondary | ICD-10-CM | POA: Diagnosis not present

## 2016-01-19 DIAGNOSIS — Z7951 Long term (current) use of inhaled steroids: Secondary | ICD-10-CM | POA: Diagnosis not present

## 2016-01-19 LAB — COMPREHENSIVE METABOLIC PANEL
ALBUMIN: 4.6 g/dL (ref 3.5–5.0)
ALK PHOS: 80 U/L (ref 38–126)
ALT: 23 U/L (ref 17–63)
AST: 28 U/L (ref 15–41)
Anion gap: 6 (ref 5–15)
BILIRUBIN TOTAL: 0.7 mg/dL (ref 0.3–1.2)
BUN: 8 mg/dL (ref 6–20)
CALCIUM: 9.2 mg/dL (ref 8.9–10.3)
CO2: 23 mmol/L (ref 22–32)
Chloride: 106 mmol/L (ref 101–111)
Creatinine, Ser: 0.84 mg/dL (ref 0.61–1.24)
GFR calc Af Amer: >60 mL/min (ref >60)
GLUCOSE: 108 mg/dL — AB (ref 65–99)
Potassium: 3.7 mmol/L (ref 3.5–5.1)
Sodium: 135 mmol/L (ref 135–145)
TOTAL PROTEIN: 7.2 g/dL (ref 6.5–8.1)

## 2016-01-19 LAB — TROPONIN I
Troponin I: <0.03 ng/mL (ref <0.031)
Troponin I: <0.03 ng/mL (ref <0.031)

## 2016-01-19 LAB — CBC
HEMATOCRIT: 42.4 % (ref 40.0–52.0)
HEMOGLOBIN: 14.7 g/dL (ref 13.0–18.0)
MCH: 32.3 pg (ref 26.0–34.0)
MCHC: 34.7 g/dL (ref 32.0–36.0)
MCV: 93 fL (ref 80.0–100.0)
Platelets: 234 10*3/uL (ref 150–440)
RBC: 4.56 MIL/uL (ref 4.40–5.90)
RDW: 13.9 % (ref 11.5–14.5)
WBC: 9.9 10*3/uL (ref 3.8–10.6)

## 2016-01-19 NOTE — ED Notes (Signed)
Per charge nurse, okay to move pt to quad to allow for better environment for sleep.  PT ambulatory to rm 21.  Report given to Danelle EarthlyNoel, [place].  Pt oriented to area, given sprite per pt request

## 2016-01-19 NOTE — ED Notes (Addendum)
Called pt's group home, spoke w/ [facility], [phone]).  Corrie DandyMary states pt still resident at group home and can pick up pt in morning.  Per Corrie DandyMary, need to call vince about possibly transferring pt to other group home.  This nurse to call Vince.
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
## 2016-01-19 NOTE — ED Notes (Addendum)
Attempted to call Glor[facility] [place], [phone], no answer and voicemail full.

## 2016-01-19 NOTE — ED Notes (Signed)
Pt arrived by EMS from [facility] with c/o chest pain. Pt has been out of the group home for about 8 days and homeless. Pt states pain started around 6pm today and was sharp and sudden.

## 2016-01-19 NOTE — Discharge Instructions (Signed)
You have been seen in the emergency department today for chest pain. Your workup has shown normal results. As we discussed please follow-up with your primary care physician in the next 1-2 days for recheck. Return to the emergency department for any further chest pain, trouble breathing, or any other symptom personally concerning to yourself. ° °Nonspecific Chest Pain °It is often hard to find the cause of chest pain. There is always a chance that your pain could be related to something serious, such as a heart attack or a blood clot in your lungs. Chest pain can also be caused by conditions that are not life-threatening. If you have chest pain, it is very important to follow up with your doctor. ° °HOME CARE °· If you were prescribed an antibiotic medicine, finish it all even if you start to feel better. °· Avoid any activities that cause chest pain. °· Do not use any tobacco products, including cigarettes, chewing tobacco, or electronic cigarettes. If you need help quitting, ask your doctor. °· Do not drink alcohol. °· Take medicines only as told by your doctor. °· Keep all follow-up visits as told by your doctor. This is important. This includes any further testing if your chest pain does not go away. °· Your doctor may tell you to keep your head raised (elevated) while you sleep. °· Make lifestyle changes as told by your doctor. These may include: °¨ Getting regular exercise. Ask your doctor to suggest some activities that are safe for you. °¨ Eating a heart-healthy diet. Your doctor or a diet specialist (dietitian) can help you to learn healthy eating options. °¨ Maintaining a healthy weight. °¨ Managing diabetes, if necessary. °¨ Reducing stress. °GET HELP IF: °· Your chest pain does not go away, even after treatment. °· You have a rash with blisters on your chest. °· You have a fever. °GET HELP RIGHT AWAY IF: °· Your chest pain is worse. °· You have an increasing cough, or you cough up blood. °· You have  severe belly (abdominal) pain. °· You feel extremely weak. °· You pass out (faint). °· You have chills. °· You have sudden, unexplained chest discomfort. °· You have sudden, unexplained discomfort in your arms, back, neck, or jaw. °· You have shortness of breath at any time. °· You suddenly start to sweat, or your skin gets clammy. °· You feel nauseous. °· You vomit. °· You suddenly feel light-headed or dizzy. °· Your heart begins to beat quickly, or it feels like it is skipping beats. °These symptoms may be an emergency. Do not wait to see if the symptoms will go away. Get medical help right away. Call your local emergency services (911 in the [place] Do not drive yourself to the hospital. °  °This information is not intended to replace advice given to you by your health care provider. Make sure you discuss any questions you have with your health care provider. °  °Document Released: 04/22/2008 Document Revised: 11/25/2014 Document Reviewed: 06/10/2014 °Elsevier Interactive Patient Education ©2016 Elsevier Inc. ° °
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
## 2016-01-19 NOTE — ED Notes (Signed)
Per Dr. Derrill KayGoodman, okay for pt to be picked up in AM, does not need monitoring.  Pt moved to hallway bed per charge nurse request.  Pt oriented to area, shown where bathroom is, given water.  Pt understands plan to discharge in AM.

## 2016-01-20 MED ORDER — OLANZAPINE 5 MG PO TABS: 2.5000 mg | ORAL_TABLET | Freq: Two times a day (BID) | ORAL | Status: DC

## 2016-01-20 NOTE — ED Notes (Addendum)
Pt has discharge paperwork but will room here until group home ppicks up pt in am.  Pt is oriented to plan.  Pt [facility] at:  [facility] at  [address], [place], [place], [phone] Or [facility] [phone], [phone], or [phone], [phone]

## 2016-01-20 NOTE — ED Notes (Signed)
Pt ambulatory to toilet

## 2016-01-20 NOTE — ED Notes (Signed)
Pt ambulatory to toilet; pt denied needs att

## 2016-02-23 ENCOUNTER — Emergency Department: Payer: Medicaid Other

## 2016-02-23 ENCOUNTER — Encounter: Payer: Self-pay | Admitting: Emergency Medicine

## 2016-02-23 ENCOUNTER — Emergency Department
Admission: EM | Admit: 2016-02-23 | Discharge: 2016-02-23 | Disposition: A | Discharge status: Home / Self Care | Payer: Medicaid Other | Attending: Emergency Medicine | Admitting: Emergency Medicine

## 2016-02-23 DIAGNOSIS — F17210 Nicotine dependence, cigarettes, uncomplicated: Secondary | ICD-10-CM | POA: Diagnosis not present

## 2016-02-23 DIAGNOSIS — R079 Chest pain, unspecified: Secondary | ICD-10-CM

## 2016-02-23 DIAGNOSIS — F333 Major depressive disorder, recurrent, severe with psychotic symptoms: Secondary | ICD-10-CM | POA: Insufficient documentation

## 2016-02-23 DIAGNOSIS — I10 Essential (primary) hypertension: Secondary | ICD-10-CM | POA: Diagnosis not present

## 2016-02-23 DIAGNOSIS — J449 Chronic obstructive pulmonary disease, unspecified: Secondary | ICD-10-CM | POA: Insufficient documentation

## 2016-02-23 DIAGNOSIS — Z79899 Other long term (current) drug therapy: Secondary | ICD-10-CM | POA: Insufficient documentation

## 2016-02-23 DIAGNOSIS — I4510 Unspecified right bundle-branch block: Secondary | ICD-10-CM | POA: Diagnosis not present

## 2016-02-23 DIAGNOSIS — R569 Unspecified convulsions: Secondary | ICD-10-CM | POA: Insufficient documentation

## 2016-02-23 LAB — URINALYSIS COMPLETE WITH MICROSCOPIC (ARMC ONLY)
Bilirubin Urine: NEGATIVE
KETONES UR: NEGATIVE mg/dL
Leukocytes, UA: NEGATIVE
NITRITE: NEGATIVE
Protein, ur: 30 mg/dL — AB
SPECIFIC GRAVITY, URINE: 1.003 — AB (ref 1.005–1.030)
pH: 7 (ref 5.0–8.0)

## 2016-02-23 LAB — URINE DRUG SCREEN, QUALITATIVE (ARMC ONLY)
Amphetamines, Ur Screen: NOT DETECTED
BARBITURATES, UR SCREEN: NOT DETECTED
BENZODIAZEPINE, UR SCRN: NOT DETECTED
CANNABINOID 50 NG, UR ~~LOC~~: NOT DETECTED
Cocaine Metabolite,Ur ~~LOC~~: NOT DETECTED
MDMA (Ecstasy)Ur Screen: NOT DETECTED
Methadone Scn, Ur: NOT DETECTED
Opiate, Ur Screen: NOT DETECTED
Phencyclidine (PCP) Ur S: NOT DETECTED
Tricyclic, Ur Screen: NOT DETECTED

## 2016-02-23 LAB — COMPREHENSIVE METABOLIC PANEL
ALK PHOS: 103 U/L (ref 38–126)
ALT: 21 U/L (ref 17–63)
ANION GAP: 9 (ref 5–15)
AST: 29 U/L (ref 15–41)
Albumin: 4.5 g/dL (ref 3.5–5.0)
BUN: 8 mg/dL (ref 6–20)
CALCIUM: 9.3 mg/dL (ref 8.9–10.3)
CHLORIDE: 107 mmol/L (ref 101–111)
CO2: 22 mmol/L (ref 22–32)
CREATININE: 0.87 mg/dL (ref 0.61–1.24)
GFR calc Af Amer: >60 mL/min (ref >60)
GFR calc non Af Amer: >60 mL/min (ref >60)
GLUCOSE: 106 mg/dL — AB (ref 65–99)
Potassium: 3 mmol/L — ABNORMAL LOW (ref 3.5–5.1)
SODIUM: 138 mmol/L (ref 135–145)
Total Bilirubin: 0.5 mg/dL (ref 0.3–1.2)
Total Protein: 7.4 g/dL (ref 6.5–8.1)

## 2016-02-23 LAB — TROPONIN I: Troponin I: 0.03 ng/mL (ref <0.031)

## 2016-02-23 LAB — ETHANOL: Alcohol, Ethyl (B): <5 mg/dL (ref <5)

## 2016-02-23 LAB — CBC
HCT: 46.7 % (ref 40.0–52.0)
Hemoglobin: 16 g/dL (ref 13.0–18.0)
MCH: 32.2 pg (ref 26.0–34.0)
MCHC: 34.2 g/dL (ref 32.0–36.0)
MCV: 94.1 fL (ref 80.0–100.0)
PLATELETS: 232 10*3/uL (ref 150–440)
RBC: 4.96 MIL/uL (ref 4.40–5.90)
RDW: 14.7 % — AB (ref 11.5–14.5)
WBC: 6.2 10*3/uL (ref 3.8–10.6)

## 2016-02-23 LAB — ACETAMINOPHEN LEVEL

## 2016-02-23 LAB — SALICYLATE LEVEL: Salicylate Lvl: <4 mg/dL (ref 2.8–30.0)

## 2016-02-23 MED ORDER — POTASSIUM CHLORIDE ER 10 MEQ PO TBCR: 10.0000 meq | EXTENDED_RELEASE_TABLET | Freq: Three times a day (TID) | ORAL | Status: DC

## 2016-02-23 MED ORDER — POTASSIUM CHLORIDE CRYS ER 20 MEQ PO TBCR
20.0000 meq | EXTENDED_RELEASE_TABLET | Freq: Once | ORAL | Status: AC
  Administered 2016-02-23: 20 meq via ORAL
  Filled 2016-02-23: qty 1

## 2016-02-23 NOTE — ED Notes (Signed)
This RN has tried to [facility] [phone] with no answer. RN left message to call back. Pt can't be discharge without talking to group home first. Will try again

## 2016-02-23 NOTE — ED Notes (Signed)
Left message for SW to call back regarding plan.

## 2016-02-23 NOTE — ED Notes (Signed)
MD at bedside. 

## 2016-02-23 NOTE — Care Management Note (Signed)
Case Management Note  Patient Details  Name: Jason Patterson MRN: [id number] Date of Birth: [birth year]  Subjective/Objective:  Checked with [facility] and the patient was last seen at home, on 04/10/15. So patient is not current with them as a PCP.                  Action/Plan:   Expected Discharge Date:                  Expected Discharge Plan:     In-House Referral:     Discharge planning Services     Post Acute Care Choice:    Choice offered to:     DME Arranged:    DME Agency:     HH Arranged:    HH Agency:     Status of Service:     Medicare Important Message Given:    Date Medicare IM Given:    Medicare IM give by:    Date Additional Medicare IM Given:    Additional Medicare Important Message give by:     If discussed at Long Length of Stay Meetings, dates discussed:    Additional Comments:  Berna BueCheryl Amrit Cress, RN 02/23/2016, 1:33 PM
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
## 2016-02-23 NOTE — Progress Notes (Signed)
Called Jason KayserVince Patterson [phone] and patient group home provider on their way to pick up patient. Called and informed ER nurse Jordon.  Delta Air LinesClaudine Daden Mahany LCSW [phone]

## 2016-02-23 NOTE — Progress Notes (Signed)
LCSW called patients current group home and left a voice mail message. Lawson RadarCalled [facility] [phone] left a detailed message awaiting call back  Anvita Hirata [place] LCSW [phone]
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
## 2016-02-23 NOTE — ED Notes (Signed)
Waiting on social work before discharge

## 2016-02-23 NOTE — Discharge Instructions (Signed)
Nonspecific Chest Pain °It is often hard to find the cause of chest pain. There is always a chance that your pain could be related to something serious, such as a heart attack or a blood clot in your lungs. Chest pain can also be caused by conditions that are not life-threatening. If you have chest pain, it is very important to follow up with your doctor. ° °HOME CARE °· If you were prescribed an antibiotic medicine, finish it all even if you start to feel better. °· Avoid any activities that cause chest pain. °· Do not use any tobacco products, including cigarettes, chewing tobacco, or electronic cigarettes. If you need help quitting, ask your doctor. °· Do not drink alcohol. °· Take medicines only as told by your doctor. °· Keep all follow-up visits as told by your doctor. This is important. This includes any further testing if your chest pain does not go away. °· Your doctor may tell you to keep your head raised (elevated) while you sleep. °· Make lifestyle changes as told by your doctor. These may include: °¨ Getting regular exercise. Ask your doctor to suggest some activities that are safe for you. °¨ Eating a heart-healthy diet. Your doctor or a diet specialist (dietitian) can help you to learn healthy eating options. °¨ Maintaining a healthy weight. °¨ Managing diabetes, if necessary. °¨ Reducing stress. °GET HELP IF: °· Your chest pain does not go away, even after treatment. °· You have a rash with blisters on your chest. °· You have a fever. °GET HELP RIGHT AWAY IF: °· Your chest pain is worse. °· You have an increasing cough, or you cough up blood. °· You have severe belly (abdominal) pain. °· You feel extremely weak. °· You pass out (faint). °· You have chills. °· You have sudden, unexplained chest discomfort. °· You have sudden, unexplained discomfort in your arms, back, neck, or jaw. °· You have shortness of breath at any time. °· You suddenly start to sweat, or your skin gets clammy. °· You feel  nauseous. °· You vomit. °· You suddenly feel light-headed or dizzy. °· Your heart begins to beat quickly, or it feels like it is skipping beats. °These symptoms may be an emergency. Do not wait to see if the symptoms will go away. Get medical help right away. Call your local emergency services (911 in the [place] Do not drive yourself to the hospital. °  °This information is not intended to replace advice given to you by your health care provider. Make sure you discuss any questions you have with your health care provider. °  °Document Released: 04/22/2008 Document Revised: 11/25/2014 Document Reviewed: 06/10/2014 °Elsevier Interactive Patient Education ©2016 Elsevier Inc. ° °Please return immediately if condition worsens. Please contact her primary physician or the physician you were given for referral. If you have any specialist physicians involved in her treatment and plan please also contact them. Thank you for using [place] [facility]. ° °
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
## 2016-02-27 ENCOUNTER — Encounter: Payer: Self-pay | Admitting: Gastroenterology

## 2016-03-18 ENCOUNTER — Emergency Department
Admission: EM | Admit: 2016-03-18 | Discharge: 2016-03-18 | Disposition: A | Discharge status: Home / Self Care | Payer: Medicaid Other | Attending: Emergency Medicine | Admitting: Emergency Medicine

## 2016-03-18 ENCOUNTER — Encounter: Payer: Self-pay | Admitting: *Deleted

## 2016-03-18 ENCOUNTER — Emergency Department: Payer: Medicaid Other

## 2016-03-18 DIAGNOSIS — F319 Bipolar disorder, unspecified: Secondary | ICD-10-CM | POA: Insufficient documentation

## 2016-03-18 DIAGNOSIS — T730XXA Starvation, initial encounter: Secondary | ICD-10-CM | POA: Insufficient documentation

## 2016-03-18 DIAGNOSIS — Z8669 Personal history of other diseases of the nervous system and sense organs: Secondary | ICD-10-CM | POA: Diagnosis not present

## 2016-03-18 DIAGNOSIS — Y999 Unspecified external cause status: Secondary | ICD-10-CM | POA: Insufficient documentation

## 2016-03-18 DIAGNOSIS — W1839XA Other fall on same level, initial encounter: Secondary | ICD-10-CM | POA: Insufficient documentation

## 2016-03-18 DIAGNOSIS — R569 Unspecified convulsions: Secondary | ICD-10-CM | POA: Insufficient documentation

## 2016-03-18 DIAGNOSIS — J449 Chronic obstructive pulmonary disease, unspecified: Secondary | ICD-10-CM | POA: Insufficient documentation

## 2016-03-18 DIAGNOSIS — S0191XA Laceration without foreign body of unspecified part of head, initial encounter: Secondary | ICD-10-CM | POA: Insufficient documentation

## 2016-03-18 DIAGNOSIS — Y9289 Other specified places as the place of occurrence of the external cause: Secondary | ICD-10-CM | POA: Insufficient documentation

## 2016-03-18 DIAGNOSIS — I10 Essential (primary) hypertension: Secondary | ICD-10-CM | POA: Diagnosis not present

## 2016-03-18 DIAGNOSIS — S0990XA Unspecified injury of head, initial encounter: Secondary | ICD-10-CM | POA: Diagnosis present

## 2016-03-18 DIAGNOSIS — F250 Schizoaffective disorder, bipolar type: Secondary | ICD-10-CM | POA: Insufficient documentation

## 2016-03-18 DIAGNOSIS — Z7951 Long term (current) use of inhaled steroids: Secondary | ICD-10-CM | POA: Insufficient documentation

## 2016-03-18 DIAGNOSIS — Z79899 Other long term (current) drug therapy: Secondary | ICD-10-CM | POA: Diagnosis not present

## 2016-03-18 DIAGNOSIS — F17210 Nicotine dependence, cigarettes, uncomplicated: Secondary | ICD-10-CM | POA: Diagnosis not present

## 2016-03-18 DIAGNOSIS — T148XXA Other injury of unspecified body region, initial encounter: Secondary | ICD-10-CM

## 2016-03-18 DIAGNOSIS — Y9389 Activity, other specified: Secondary | ICD-10-CM | POA: Diagnosis not present

## 2016-03-18 LAB — BASIC METABOLIC PANEL
ANION GAP: 13 (ref 5–15)
BUN: 10 mg/dL (ref 6–20)
CHLORIDE: 98 mmol/L — AB (ref 101–111)
CO2: 20 mmol/L — AB (ref 22–32)
Calcium: 8.8 mg/dL — ABNORMAL LOW (ref 8.9–10.3)
Creatinine, Ser: 1.02 mg/dL (ref 0.61–1.24)
GFR calc Af Amer: >60 mL/min (ref >60)
GLUCOSE: 128 mg/dL — AB (ref 65–99)
POTASSIUM: 3.9 mmol/L (ref 3.5–5.1)
Sodium: 131 mmol/L — ABNORMAL LOW (ref 135–145)

## 2016-03-18 LAB — URINALYSIS COMPLETE WITH MICROSCOPIC (ARMC ONLY)
BILIRUBIN URINE: NEGATIVE
Glucose, UA: NEGATIVE mg/dL
KETONES UR: NEGATIVE mg/dL
LEUKOCYTES UA: NEGATIVE
Nitrite: NEGATIVE
PH: 6 (ref 5.0–8.0)
Protein, ur: NEGATIVE mg/dL
SQUAMOUS EPITHELIAL / LPF: NONE SEEN
Specific Gravity, Urine: 1.004 — ABNORMAL LOW (ref 1.005–1.030)
WBC, UA: NONE SEEN WBC/hpf (ref 0–5)

## 2016-03-18 LAB — URINE DRUG SCREEN, QUALITATIVE (ARMC ONLY)
Amphetamines, Ur Screen: NOT DETECTED
BARBITURATES, UR SCREEN: NOT DETECTED
BENZODIAZEPINE, UR SCRN: NOT DETECTED
CANNABINOID 50 NG, UR ~~LOC~~: NOT DETECTED
Cocaine Metabolite,Ur ~~LOC~~: NOT DETECTED
MDMA (Ecstasy)Ur Screen: NOT DETECTED
Methadone Scn, Ur: NOT DETECTED
OPIATE, UR SCREEN: NOT DETECTED
PHENCYCLIDINE (PCP) UR S: NOT DETECTED
Tricyclic, Ur Screen: NOT DETECTED

## 2016-03-18 LAB — HEPATIC FUNCTION PANEL
ALT: 18 U/L (ref 17–63)
AST: 34 U/L (ref 15–41)
Albumin: 4 g/dL (ref 3.5–5.0)
Alkaline Phosphatase: 72 U/L (ref 38–126)
BILIRUBIN DIRECT: 0.3 mg/dL (ref 0.1–0.5)
BILIRUBIN TOTAL: 1 mg/dL (ref 0.3–1.2)
Indirect Bilirubin: 0.7 mg/dL (ref 0.3–0.9)
Total Protein: 6.4 g/dL — ABNORMAL LOW (ref 6.5–8.1)

## 2016-03-18 LAB — CBC
HEMATOCRIT: 38.3 % — AB (ref 40.0–52.0)
HEMOGLOBIN: 13.2 g/dL (ref 13.0–18.0)
MCH: 32.4 pg (ref 26.0–34.0)
MCHC: 34.5 g/dL (ref 32.0–36.0)
MCV: 93.9 fL (ref 80.0–100.0)
Platelets: 209 10*3/uL (ref 150–440)
RBC: 4.07 MIL/uL — ABNORMAL LOW (ref 4.40–5.90)
RDW: 14.6 % — ABNORMAL HIGH (ref 11.5–14.5)
WBC: 6.7 10*3/uL (ref 3.8–10.6)

## 2016-03-18 LAB — ETHANOL: Alcohol, Ethyl (B): <5 mg/dL (ref <5)

## 2016-03-18 LAB — TROPONIN I: Troponin I: <0.03 ng/mL (ref <0.031)

## 2016-03-18 MED ORDER — SODIUM CHLORIDE 0.9 % IV SOLN
1000.0000 mg | Freq: Once | INTRAVENOUS | Status: AC
  Administered 2016-03-18: 1000 mg via INTRAVENOUS
  Filled 2016-03-18: qty 10

## 2016-03-18 NOTE — Discharge Instructions (Signed)
Seizure, Adult A seizure is abnormal electrical activity in the brain. Seizures usually last from 30 seconds to 2 minutes. There are various types of seizures. Before a seizure, you may have a warning sensation (aura) that a seizure is about to occur. An aura may include the following symptoms:   Fear or anxiety.  Nausea.  Feeling like the room is spinning (vertigo).  Vision changes, such as seeing flashing lights or spots. Common symptoms during a seizure include:  A change in attention or behavior (altered mental status).  Convulsions with rhythmic jerking movements.  Drooling.  Rapid eye movements.  Grunting.  Loss of bladder and bowel control.  Bitter taste in the mouth.  Tongue biting. After a seizure, you may feel confused and sleepy. You may also have an injury resulting from convulsions during the seizure. HOME CARE INSTRUCTIONS   If you are given medicines, take them exactly as prescribed by your health care provider.  Keep all follow-up appointments as directed by your health care provider.  Do not swim or drive or engage in risky activity during which a seizure could cause further injury to you or others until your health care provider says it is OK.  Get adequate rest.  Teach friends and family what to do if you have a seizure. They should:  Lay you on the ground to prevent a fall.  Put a cushion under your head.  Loosen any tight clothing around your neck.  Turn you on your side. If vomiting occurs, this helps keep your airway clear.  Stay with you until you recover.  Know whether or not you need emergency care. SEEK IMMEDIATE MEDICAL CARE IF:  The seizure lasts longer than 5 minutes.  The seizure is severe or you do not wake up immediately after the seizure.  You have an altered mental status after the seizure.  You are having more frequent or worsening seizures. Someone should drive you to the emergency department or call local emergency  services (911 in [place] MAKE SURE YOU:  Understand these instructions.  Will watch your condition.  Will get help right away if you are not doing well or get worse.   This information is not intended to replace advice given to you by your health care provider. Make sure you discuss any questions you have with your health care provider.   Document Released: 11/01/2000 Document Revised: 11/25/2014 Document Reviewed: 06/16/2013 Elsevier Interactive Patient Education 2016 Elsevier Inc.  Head Injury, Adult You have a head injury. Headaches and throwing up (vomiting) are common after a head injury. It should be easy to wake up from sleeping. Sometimes you must stay in the hospital. Most problems happen within the first 24 hours. Side effects may occur up to 7-10 days after the injury.  WHAT ARE THE TYPES OF HEAD INJURIES? Head injuries can be as minor as a bump. Some head injuries can be more severe. More severe head injuries include:  A jarring injury to the brain (concussion).  A bruise of the brain (contusion). This mean there is bleeding in the brain that can cause swelling.  A cracked skull (skull fracture).  Bleeding in the brain that collects, clots, and forms a bump (hematoma). WHEN SHOULD I GET HELP RIGHT AWAY?   You are confused or sleepy.  You cannot be woken up.  You feel sick to your stomach (nauseous) or keep throwing up (vomiting).  Your dizziness or unsteadiness is getting worse.  You have very bad, lasting headaches that  are not helped by medicine. Take medicines only as told by your doctor.  You cannot use your arms or legs like normal.  You cannot walk.  You notice changes in the black spots in the center of the colored part of your eye (pupil).  You have clear or bloody fluid coming from your nose or ears.  You have trouble seeing. During the next 24 hours after the injury, you must stay with someone who can watch you. This person should get help right  away (call 911 in the [place] if you start to shake and are not able to control it (have seizures), you pass out, or you are unable to wake up. HOW CAN I PREVENT A HEAD INJURY IN THE FUTURE?  Wear seat belts.  Wear a helmet while bike riding and playing sports like football.  Stay away from dangerous activities around the house. WHEN CAN I RETURN TO NORMAL ACTIVITIES AND ATHLETICS? See your doctor before doing these activities. You should not do normal activities or play contact sports until 1 week after the following symptoms have stopped:  Headache that does not go away.  Dizziness.  Poor attention.  Confusion.  Memory problems.  Sickness to your stomach or throwing up.  Tiredness.  Fussiness.  Bothered by bright lights or loud noises.  Anxiousness or depression.  Restless sleep. MAKE SURE YOU:   Understand these instructions.  Will watch your condition.  Will get help right away if you are not doing well or get worse.   This information is not intended to replace advice given to you by your health care provider. Make sure you discuss any questions you have with your health care provider.   Document Released: 10/17/2008 Document Revised: 11/25/2014 Document Reviewed: 07/12/2013 Elsevier Interactive Patient Education [employer]2016 Elsevier Inc.
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
## 2016-03-18 NOTE — ED Notes (Signed)
Left message for social work to see pt for discharge planning.
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
## 2016-03-18 NOTE — ED Notes (Signed)
Pt brought in via ems.  Pt was at [employer] and was sitting outside and a bystander called ems because pt has abd pain.  Pt was discharged at noon today from [facility].  Pt was sent to a group home today and pt left.  Pt reports he is homeless.  Pt states he has intermittent abd pain.  No n/v/d.  Pt alert and talkative.

## 2016-03-18 NOTE — ED Notes (Signed)
EMS pt to rm [address] Pt states he is unsure of what happened bu fell hitting the back of his head. C/O  Head, neck and mid back pain. Pt has a small lac to the back of his head with bleeding controlled. Pt placed in c-collar by Susy FrizzleMatt, RN on arrival to room. Pt thinks he had a seizure but is unsure. Has not had seizure in over 8 years.
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
## 2016-03-19 ENCOUNTER — Emergency Department
Admission: EM | Admit: 2016-03-19 | Discharge: 2016-03-19 | Disposition: A | Discharge status: Home / Self Care | Payer: Medicaid Other | Attending: Emergency Medicine | Admitting: Emergency Medicine

## 2016-03-19 DIAGNOSIS — T730XXA Starvation, initial encounter: Secondary | ICD-10-CM

## 2016-03-19 NOTE — ED Notes (Signed)
Cigaret given back to pt.
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
## 2016-03-19 NOTE — ED Notes (Signed)
Pt found smoking with his head covered under blanket. Pt informed that this is non-smoking facility and can't smoke inside. Cigaret taken away from the pt and informed it'll be given back to him when he get discharged.

## 2016-03-19 NOTE — Discharge Instructions (Signed)
You were seen in the ER for hunger pains. Return to the ER for worsening symptoms, persistent vomiting, difficulty breathing or other concerns.  Malnutrition Malnutrition is any condition in which nutrition is poor. There are many forms of malnutrition. A common form is having too little of one kind of nutrient (nutritional deficiency). Nutrients include proteins, minerals, carbohydrates, fats, and vitamins. They provide the body with energy and keep the body working normally. Malnutrition ranges from mild to severe. The condition affects the body's defense system (immune system). Because of this, people who are malnourished are more likely to develop health problems and get sick. CAUSES Causes of malnutrition include:  Eating an unbalanced diet.  Eating too much of certain foods.  Eating too little.  Conditions that decrease the body's ability to use nutrients. RISK FACTORS Risk factors include:  Pregnancy and lactation. Women who are pregnant may become malnourished if they do not increase their nutrient intake. They are also susceptible to folic acid deficiency.  Increasing age. The body's ability to absorb nutrients decreases with age. This can contribute to iron, calcium, and vitamin D deficiencies.  Alcohol or drug dependency. Addiction often leads to a lifestyle in which proper nourishment is ignored. Dependency can also hurt the metabolism and the body's ability to absorb nutrients. Alcoholism is a major cause of thiamine deficiency and can lead to deficiencies of magnesium, zinc, and other vitamins.  Eating disorders, such as anorexia nervosa. People with these disorders may eat too little or too much.  Chewing or swallowing problems. People with these disorders may not eat enough.  Certain diseases, including:  Long-lasting (chronic) diseases. Chronic diseases tend to affect the absorption of calcium, iron, and vitamins B12, A, D, E, and K.  Liver disease. Liver disease  affects the storage of vitamins A and B12. It also interferes with the metabolism of protein and energy sources.  Kidney disease. Kidney disease may cause deficiencies of protein, iron, and vitamin D.  Cancer or AIDS. These diseases can cause a loss of appetite.  Cystic fibrosis. This disease can make it difficult for the body to absorb nutrients.  Certain diets, including.  The vegetarian diet. Vegetarians are at risk for iron deficiency.  The vegan diet. Vegans are susceptible to vitamin B12, calcium, iron, vitamin D, and zinc deficiencies.  The fruitarian diet. This diet can be deficient in protein, sodium, and many micronutrients.  Many commercial "fad" diets, including those that claim to enhance well-being and reduce weight.  Very low calorie diets.  Low income. People with a low income may have trouble paying for nutritious foods. SIGNS AND SYMPTOMS Signs and symptoms depend on the kind of malnutrition you have. Common symptoms include:  Fatigue.  Weakness.  Dizziness.  Fainting  Weight loss.  Poor immune response.  Lack of menstruation.  Hair loss.  Poor memory. DIAGNOSIS  Malnutrition may be diagnosed by:  A medical history.  A dietary history.  A physical exam. This may include a measurement of your body mass index (BMI).  Blood tests. TREATMENT  Treatments vary depending on the cause of the malnutrition. Common treatments include:  Dietary changes.  Dietary supplements, such as vitamins and minerals.  Treatment of any underlying conditions. HOME CARE INSTRUCTIONS  Eat a balanced diet.  Take dietary supplements as directed by your health care provider.  Exercise regularly. Exercising can improve appetite.  Keep all follow-up visits as directed by your health care provider. This is important. PREVENTION Eating a well-balanced diet helps to prevent  most forms of malnutrition. SEEK MEDICAL CARE IF:  You have increased weakness or  fatigue.  You faint.  You stop menstruating.  You have rapid hair loss.  You have unexpected weight loss.   This information is not intended to replace advice given to you by your health care provider. Make sure you discuss any questions you have with your health care provider.   Document Released: 09/20/2005 Document Revised: 11/25/2014 Document Reviewed: 07/01/2014 Elsevier Interactive Patient Education [employer]

## 2016-03-19 NOTE — ED Notes (Addendum)
Pt states he does not know why he is here. States: "I'm sick or anything. The ambulance brought me here." Pt state his stomach still hurts trom a fall due to seizure yesterday. Pt states he was evaluated here and discharged.

## 2016-05-12 IMAGING — CR DG CHEST 2V
2 series · 2 of 2 positions shown · non-contrast
Comparison: 01/19/2016 and earlier.

CLINICAL DATA: 59-year-old male with 2 days of chest pain radiating
to the left side. Found down. Initial encounter.

EXAM:
CHEST  2 VIEW

[chest pa]
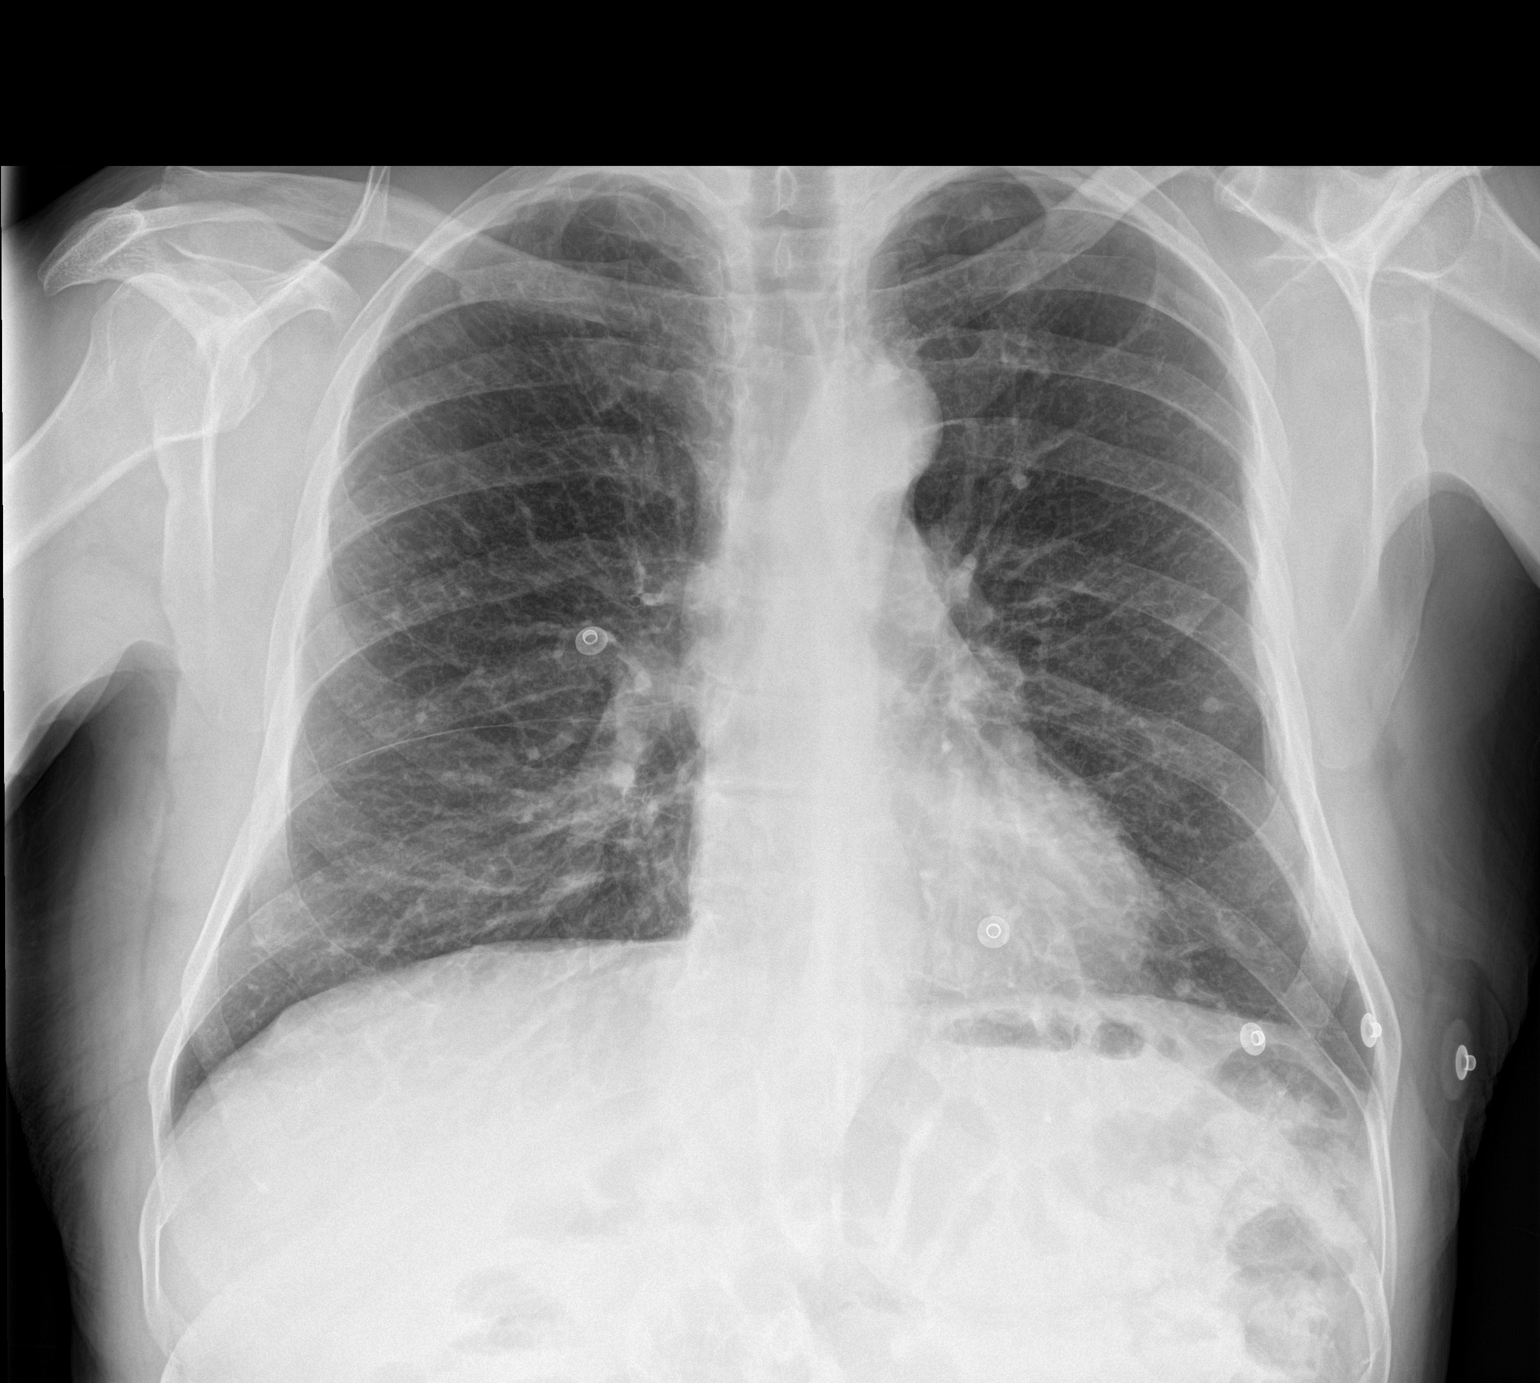

[chest lat]
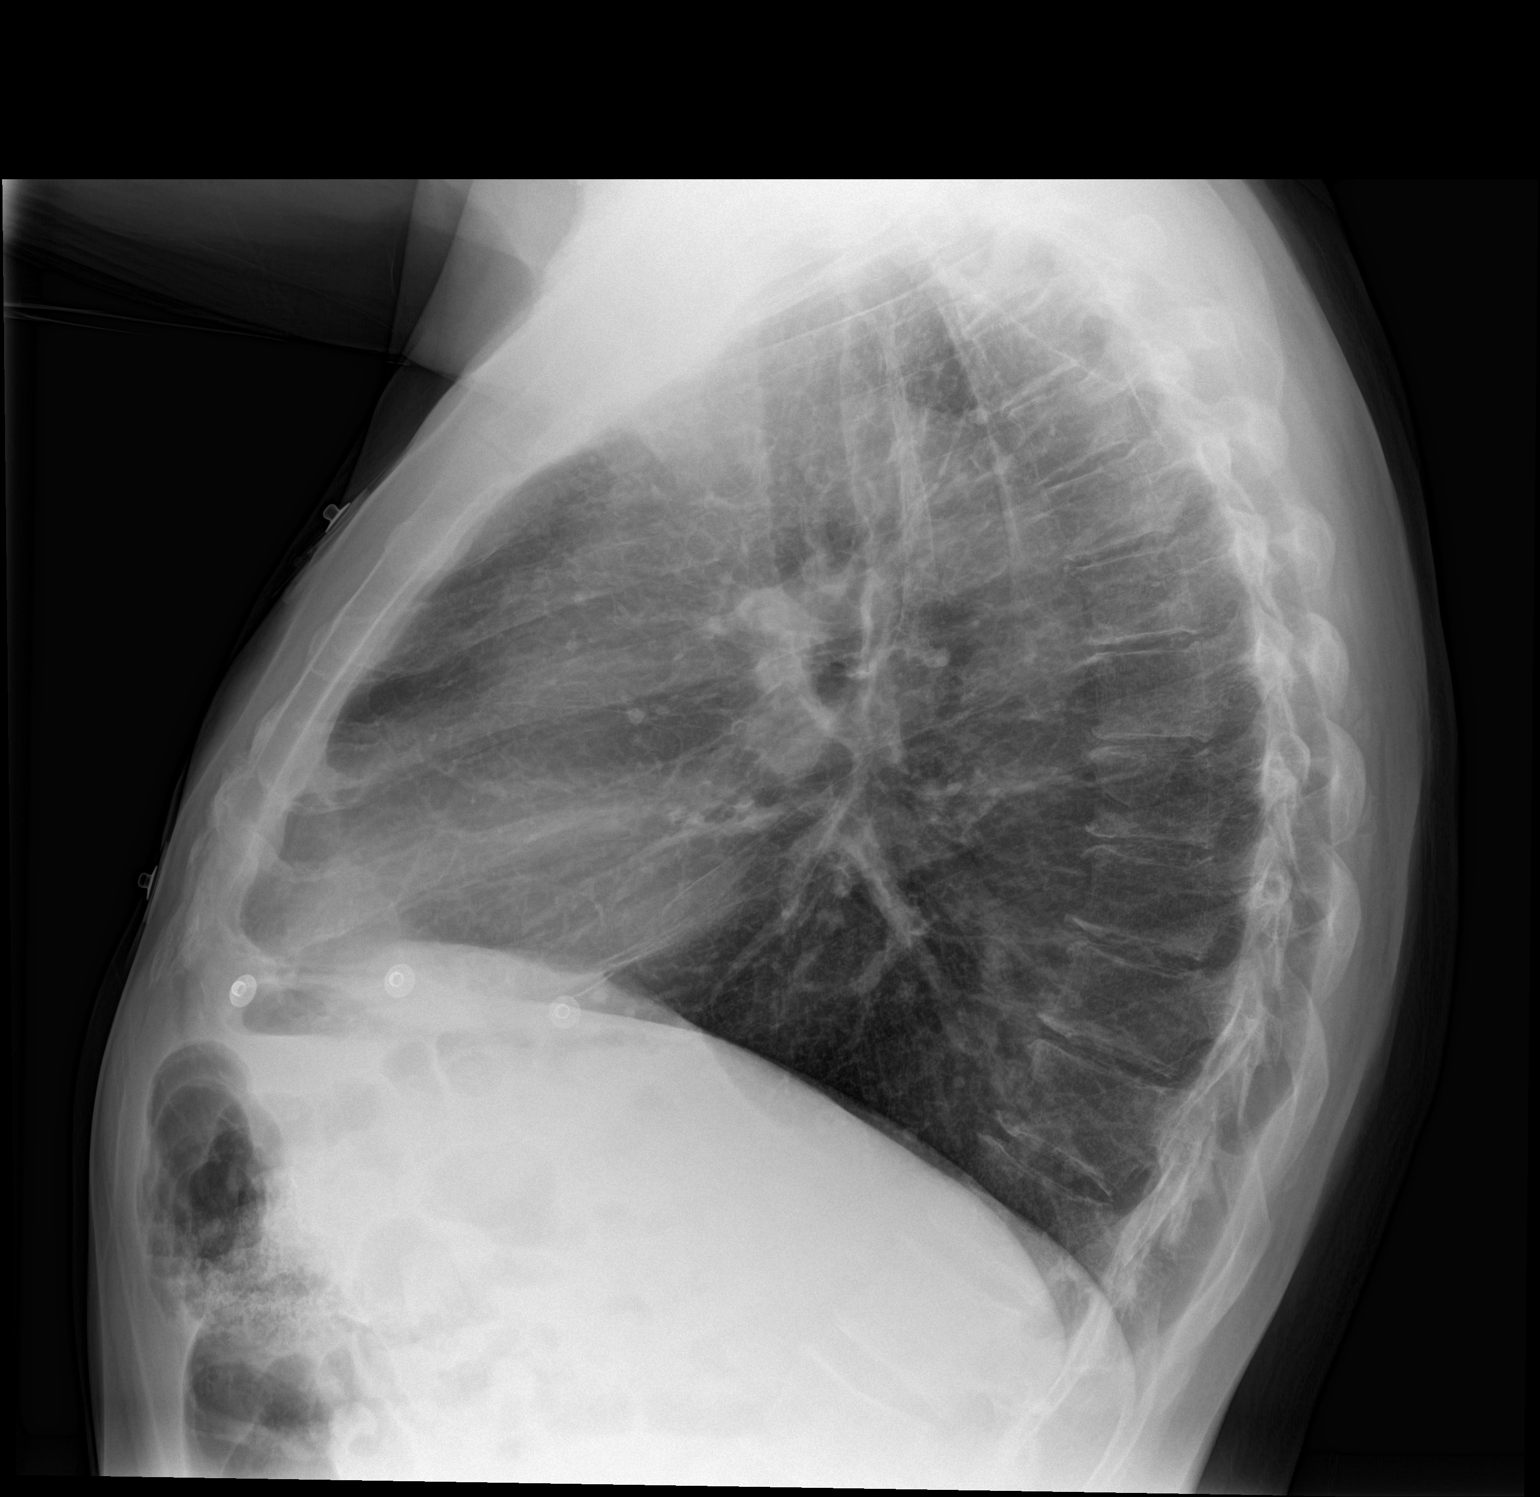

[2 of 2 positions shown; findings below may reference images not displayed]

FINDINGS: Stable large lung volumes with increased AP dimension to the chest.
Normal cardiac size and mediastinal contours. Visualized tracheal
air column is within normal limits. Chronic increased interstitial
markings are stable. No pneumothorax, pulmonary edema, pleural
effusion or acute pulmonary opacity. Osteopenia. No acute osseous
abnormality identified.
IMPRESSION: Stable chronic lung disease.  No acute cardiopulmonary abnormality.

## 2018-08-27 ENCOUNTER — Encounter (HOSPITAL_COMMUNITY): Payer: Self-pay | Admitting: Emergency Medicine

## 2018-08-27 ENCOUNTER — Inpatient Hospital Stay (HOSPITAL_COMMUNITY)
Admission: EM | Admit: 2018-08-27 | Discharge: 2018-08-31 | DRG: 557 | Disposition: A | Discharge status: Home / Self Care | Payer: Medicaid Other | Attending: Internal Medicine | Admitting: Internal Medicine

## 2018-08-27 ENCOUNTER — Emergency Department (HOSPITAL_COMMUNITY): Payer: Medicaid Other

## 2018-08-27 ENCOUNTER — Other Ambulatory Visit: Payer: Self-pay

## 2018-08-27 DIAGNOSIS — Z880 Allergy status to penicillin: Secondary | ICD-10-CM

## 2018-08-27 DIAGNOSIS — N183 Chronic kidney disease, stage 3 (moderate): Secondary | ICD-10-CM | POA: Diagnosis present

## 2018-08-27 DIAGNOSIS — R739 Hyperglycemia, unspecified: Secondary | ICD-10-CM | POA: Diagnosis present

## 2018-08-27 DIAGNOSIS — Z7951 Long term (current) use of inhaled steroids: Secondary | ICD-10-CM

## 2018-08-27 DIAGNOSIS — E872 Acidosis: Secondary | ICD-10-CM

## 2018-08-27 DIAGNOSIS — K219 Gastro-esophageal reflux disease without esophagitis: Secondary | ICD-10-CM | POA: Diagnosis present

## 2018-08-27 DIAGNOSIS — R0603 Acute respiratory distress: Secondary | ICD-10-CM

## 2018-08-27 DIAGNOSIS — F319 Bipolar disorder, unspecified: Secondary | ICD-10-CM | POA: Diagnosis present

## 2018-08-27 DIAGNOSIS — J189 Pneumonia, unspecified organism: Secondary | ICD-10-CM | POA: Diagnosis present

## 2018-08-27 DIAGNOSIS — R6510 Systemic inflammatory response syndrome (SIRS) of non-infectious origin without acute organ dysfunction: Secondary | ICD-10-CM | POA: Diagnosis present

## 2018-08-27 DIAGNOSIS — R7401 Elevation of levels of liver transaminase levels: Secondary | ICD-10-CM | POA: Diagnosis present

## 2018-08-27 DIAGNOSIS — I129 Hypertensive chronic kidney disease with stage 1 through stage 4 chronic kidney disease, or unspecified chronic kidney disease: Secondary | ICD-10-CM | POA: Diagnosis present

## 2018-08-27 DIAGNOSIS — G252 Other specified forms of tremor: Secondary | ICD-10-CM | POA: Diagnosis present

## 2018-08-27 DIAGNOSIS — J449 Chronic obstructive pulmonary disease, unspecified: Secondary | ICD-10-CM | POA: Diagnosis present

## 2018-08-27 DIAGNOSIS — F411 Generalized anxiety disorder: Secondary | ICD-10-CM | POA: Diagnosis present

## 2018-08-27 DIAGNOSIS — I10 Essential (primary) hypertension: Secondary | ICD-10-CM | POA: Diagnosis present

## 2018-08-27 DIAGNOSIS — R569 Unspecified convulsions: Secondary | ICD-10-CM

## 2018-08-27 DIAGNOSIS — J9811 Atelectasis: Secondary | ICD-10-CM | POA: Diagnosis present

## 2018-08-27 DIAGNOSIS — Z79899 Other long term (current) drug therapy: Secondary | ICD-10-CM

## 2018-08-27 DIAGNOSIS — M6282 Rhabdomyolysis: Principal | ICD-10-CM | POA: Diagnosis present

## 2018-08-27 DIAGNOSIS — G40909 Epilepsy, unspecified, not intractable, without status epilepticus: Secondary | ICD-10-CM | POA: Diagnosis present

## 2018-08-27 DIAGNOSIS — T17890A Other foreign object in other parts of respiratory tract causing asphyxiation, initial encounter: Secondary | ICD-10-CM | POA: Diagnosis present

## 2018-08-27 DIAGNOSIS — K449 Diaphragmatic hernia without obstruction or gangrene: Secondary | ICD-10-CM | POA: Diagnosis present

## 2018-08-27 DIAGNOSIS — N179 Acute kidney failure, unspecified: Secondary | ICD-10-CM

## 2018-08-27 DIAGNOSIS — F250 Schizoaffective disorder, bipolar type: Secondary | ICD-10-CM | POA: Diagnosis present

## 2018-08-27 DIAGNOSIS — N189 Chronic kidney disease, unspecified: Secondary | ICD-10-CM | POA: Diagnosis present

## 2018-08-27 DIAGNOSIS — J440 Chronic obstructive pulmonary disease with acute lower respiratory infection: Secondary | ICD-10-CM | POA: Diagnosis present

## 2018-08-27 DIAGNOSIS — E871 Hypo-osmolality and hyponatremia: Secondary | ICD-10-CM | POA: Diagnosis present

## 2018-08-27 DIAGNOSIS — Z89422 Acquired absence of other left toe(s): Secondary | ICD-10-CM

## 2018-08-27 DIAGNOSIS — R740 Nonspecific elevation of levels of transaminase and lactic acid dehydrogenase [LDH]: Secondary | ICD-10-CM | POA: Diagnosis present

## 2018-08-27 DIAGNOSIS — Z888 Allergy status to other drugs, medicaments and biological substances status: Secondary | ICD-10-CM

## 2018-08-27 DIAGNOSIS — F17210 Nicotine dependence, cigarettes, uncomplicated: Secondary | ICD-10-CM | POA: Diagnosis present

## 2018-08-27 HISTORY — DX: Other ill-defined heart diseases: I51.89

## 2018-08-27 HISTORY — DX: Rhabdomyolysis: M62.82

## 2018-08-27 LAB — CBC WITH DIFFERENTIAL/PLATELET
ABS IMMATURE GRANULOCYTES: 0.06 10*3/uL (ref 0.00–0.07)
Basophils Absolute: 0 10*3/uL (ref 0.0–0.1)
Basophils Relative: 0 %
Eosinophils Absolute: 0 10*3/uL (ref 0.0–0.5)
Eosinophils Relative: 0 %
HCT: 38.4 % — ABNORMAL LOW (ref 39.0–52.0)
HEMOGLOBIN: 12.9 g/dL — AB (ref 13.0–17.0)
IMMATURE GRANULOCYTES: 1 %
LYMPHS PCT: 4 %
Lymphs Abs: 0.5 10*3/uL — ABNORMAL LOW (ref 0.7–4.0)
MCH: 30.8 pg (ref 26.0–34.0)
MCHC: 33.6 g/dL (ref 30.0–36.0)
MCV: 91.6 fL (ref 80.0–100.0)
MONO ABS: 0.3 10*3/uL (ref 0.1–1.0)
MONOS PCT: 3 %
Neutro Abs: 11.6 10*3/uL — ABNORMAL HIGH (ref 1.7–7.7)
Neutrophils Relative %: 92 %
Platelets: 235 10*3/uL (ref 150–400)
RBC: 4.19 MIL/uL — AB (ref 4.22–5.81)
RDW: 12.7 % (ref 11.5–15.5)
WBC: 12.4 10*3/uL — ABNORMAL HIGH (ref 4.0–10.5)
nRBC: 0 % (ref 0.0–0.2)

## 2018-08-27 LAB — BASIC METABOLIC PANEL
ANION GAP: 16 — AB (ref 5–15)
BUN: 16 mg/dL (ref 8–23)
CHLORIDE: 100 mmol/L (ref 98–111)
CO2: 13 mmol/L — ABNORMAL LOW (ref 22–32)
Calcium: 8.7 mg/dL — ABNORMAL LOW (ref 8.9–10.3)
Creatinine, Ser: 1.39 mg/dL — ABNORMAL HIGH (ref 0.61–1.24)
GFR calc Af Amer: >60 mL/min (ref >60)
GFR calc non Af Amer: 52 mL/min — ABNORMAL LOW (ref >60)
Glucose, Bld: 233 mg/dL — ABNORMAL HIGH (ref 70–99)
POTASSIUM: 3.9 mmol/L (ref 3.5–5.1)
SODIUM: 129 mmol/L — AB (ref 135–145)

## 2018-08-27 LAB — URINALYSIS, COMPLETE (UACMP) WITH MICROSCOPIC
Bilirubin Urine: NEGATIVE
GLUCOSE, UA: 50 mg/dL — AB
KETONES UR: NEGATIVE mg/dL
Leukocytes, UA: NEGATIVE
Nitrite: NEGATIVE
PROTEIN: NEGATIVE mg/dL
Specific Gravity, Urine: 1.008 (ref 1.005–1.030)
pH: 6 (ref 5.0–8.0)

## 2018-08-27 LAB — CBG MONITORING, ED: Glucose-Capillary: 229 mg/dL — ABNORMAL HIGH (ref 70–99)

## 2018-08-27 LAB — I-STAT CG4 LACTIC ACID, ED
LACTIC ACID, VENOUS: 8.37 mmol/L — AB (ref 0.5–1.9)
LACTIC ACID, VENOUS: 1.49 mmol/L (ref 0.5–1.9)

## 2018-08-27 LAB — ETHANOL

## 2018-08-27 LAB — RAPID URINE DRUG SCREEN, HOSP PERFORMED
AMPHETAMINES: NOT DETECTED
Barbiturates: NOT DETECTED
Benzodiazepines: NOT DETECTED
Cocaine: NOT DETECTED
OPIATES: NOT DETECTED
Tetrahydrocannabinol: NOT DETECTED

## 2018-08-27 LAB — D-DIMER, QUANTITATIVE (NOT AT ARMC): D DIMER QUANT: 1.6 ug{FEU}/mL — AB (ref 0.00–0.50)

## 2018-08-27 LAB — CK: Total CK: 440 U/L — ABNORMAL HIGH (ref 49–397)

## 2018-08-27 LAB — I-STAT TROPONIN, ED: TROPONIN I, POC: 0.07 ng/mL (ref 0.00–0.08)

## 2018-08-27 MED ORDER — IPRATROPIUM-ALBUTEROL 0.5-2.5 (3) MG/3ML IN SOLN
3.0000 mL | RESPIRATORY_TRACT | Status: AC
  Administered 2018-08-28: 3 mL via RESPIRATORY_TRACT
  Filled 2018-08-27: qty 3

## 2018-08-27 MED ORDER — NICOTINE 21 MG/24HR TD PT24
21.0000 mg | MEDICATED_PATCH | Freq: Every day | TRANSDERMAL | Status: DC
  Filled 2018-08-27 (×2): qty 1

## 2018-08-27 MED ORDER — ONDANSETRON HCL 4 MG/2ML IJ SOLN
4.0000 mg | Freq: Four times a day (QID) | INTRAMUSCULAR | Status: DC | PRN
  Administered 2018-08-28 – 2018-08-31 (×3): 4 mg via INTRAVENOUS
  Filled 2018-08-27 (×3): qty 2

## 2018-08-27 MED ORDER — LORAZEPAM 2 MG/ML IJ SOLN: 1.0000 mg | INTRAMUSCULAR | Status: DC | PRN

## 2018-08-27 MED ORDER — INSULIN ASPART 100 UNIT/ML ~~LOC~~ SOLN
0.0000 [IU] | Freq: Every day | SUBCUTANEOUS | Status: DC
  Administered 2018-08-28: 2 [IU] via SUBCUTANEOUS

## 2018-08-27 MED ORDER — VANCOMYCIN HCL IN DEXTROSE 750-5 MG/150ML-% IV SOLN
750.0000 mg | Freq: Two times a day (BID) | INTRAVENOUS | Status: DC
  Administered 2018-08-28 – 2018-08-29 (×3): 750 mg via INTRAVENOUS
  Filled 2018-08-27 (×3): qty 150

## 2018-08-27 MED ORDER — INSULIN ASPART 100 UNIT/ML ~~LOC~~ SOLN
0.0000 [IU] | Freq: Three times a day (TID) | SUBCUTANEOUS | Status: DC
  Administered 2018-08-28 – 2018-08-29 (×3): 1 [IU] via SUBCUTANEOUS

## 2018-08-27 MED ORDER — ONDANSETRON HCL 4 MG PO TABS
4.0000 mg | ORAL_TABLET | Freq: Four times a day (QID) | ORAL | Status: DC | PRN
  Administered 2018-08-30 (×2): 4 mg via ORAL
  Filled 2018-08-27 (×2): qty 1

## 2018-08-27 MED ORDER — SODIUM CHLORIDE 0.9% FLUSH
3.0000 mL | Freq: Two times a day (BID) | INTRAVENOUS | Status: DC
  Administered 2018-08-28 – 2018-08-30 (×5): 3 mL via INTRAVENOUS

## 2018-08-27 MED ORDER — ENOXAPARIN SODIUM 40 MG/0.4ML ~~LOC~~ SOLN
40.0000 mg | Freq: Every day | SUBCUTANEOUS | Status: DC
  Administered 2018-08-28 – 2018-08-31 (×4): 40 mg via SUBCUTANEOUS
  Filled 2018-08-27 (×4): qty 0.4

## 2018-08-27 MED ORDER — LEVETIRACETAM IN NACL 1000 MG/100ML IV SOLN
1000.0000 mg | Freq: Two times a day (BID) | INTRAVENOUS | Status: DC
  Administered 2018-08-28: 1000 mg via INTRAVENOUS
  Filled 2018-08-27 (×2): qty 100

## 2018-08-27 MED ORDER — VANCOMYCIN HCL 10 G IV SOLR
1500.0000 mg | Freq: Once | INTRAVENOUS | Status: AC
  Administered 2018-08-27: 1500 mg via INTRAVENOUS
  Filled 2018-08-27: qty 1500

## 2018-08-27 MED ORDER — IOPAMIDOL (ISOVUE-370) INJECTION 76%
100.0000 mL | Freq: Once | INTRAVENOUS | Status: AC | PRN
  Administered 2018-08-27: 100 mL via INTRAVENOUS

## 2018-08-27 MED ORDER — SODIUM CHLORIDE 0.9 % IV BOLUS
1000.0000 mL | Freq: Once | INTRAVENOUS | Status: AC
  Administered 2018-08-27: 1000 mL via INTRAVENOUS

## 2018-08-27 MED ORDER — SODIUM CHLORIDE 0.9 % IV SOLN
1.0000 g | Freq: Three times a day (TID) | INTRAVENOUS | Status: DC
  Administered 2018-08-28 – 2018-08-31 (×11): 1 g via INTRAVENOUS
  Filled 2018-08-27 (×12): qty 1

## 2018-08-27 MED ORDER — SODIUM CHLORIDE 0.9 % IV SOLN
INTRAVENOUS | Status: DC
  Administered 2018-08-28 – 2018-08-29 (×4): via INTRAVENOUS
  Administered 2018-08-30: 1000 mL via INTRAVENOUS
  Administered 2018-08-30: 10:00:00 via INTRAVENOUS
  Administered 2018-08-30 – 2018-08-31 (×2): 1000 mL via INTRAVENOUS

## 2018-08-27 MED ORDER — SODIUM CHLORIDE 0.9 % IV SOLN
2.0000 g | Freq: Once | INTRAVENOUS | Status: AC
  Administered 2018-08-27: 2 g via INTRAVENOUS
  Filled 2018-08-27: qty 2

## 2018-08-27 MED ORDER — IPRATROPIUM-ALBUTEROL 0.5-2.5 (3) MG/3ML IN SOLN
3.0000 mL | Freq: Four times a day (QID) | RESPIRATORY_TRACT | Status: DC | PRN
  Administered 2018-08-30: 3 mL via RESPIRATORY_TRACT
  Filled 2018-08-27: qty 3

## 2018-08-27 NOTE — ED Notes (Signed)
Patient transported to CT 

## 2018-08-27 NOTE — Progress Notes (Signed)
Pharmacy Antibiotic Note  Jason Patterson is a 62 y.o. male admitted on 08/27/2018 with sepsis.  Pharmacy has been consulted for Vancomycin and Cefepime dosing. WBC - 12.4, afebrile, lactic acid - 8.37, Scr-1.39   Plan: -Vancomycin 1500mg  x1, then Vancomycin 750mg  q12h -Cefepime 2 g x1, then Cefepime 1g q8h -Monitor and adjust abx per renal fx and C&S, vanc trough as needed for trough of 15-20   Height: 5' 7.25" (170.8 cm) Weight: 172 lb (78 kg) IBW/kg (Calculated) : 66.68  Temp (24hrs), Avg:99.4 F (37.4 C), Min:99.4 F (37.4 C), Max:99.4 F (37.4 C)  Recent Labs  Lab 08/27/18 1627  LATICACIDVEN 8.37*    CrCl cannot be calculated (Patient's most recent lab result is older than the maximum 21 days allowed.).    Allergies  Allergen Reactions  . Paxil [Paroxetine Hcl] Other (See Comments)    Told by MD to discontinue use  . Penicillins Other (See Comments)    Family history of allergies; patient's sister took once and died as a result (FYI).  Amoxicillin is ok  . Tramadol Other (See Comments)    Seizure disorder.  Caused seizure.  . Depakote [Divalproex Sodium] Hives and Swelling  . Acyclovir And Related     Antimicrobials this admission: Vancomycin 10/10 >>  Cefepime 10/10 >>   Dose adjustments this admission: N/A  Microbiology results: Pending  Thank you for allowing pharmacy to be a part of this patient's care.  Koleen Nimrod 08/27/2018 5:08 PM

## 2018-08-27 NOTE — ED Notes (Signed)
Representative from [facility] states he takes ECT [id number] [name] - guardian rep. [phone] for after hours. Group home residential facility, [facility] [phone].
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
## 2018-08-27 NOTE — ED Notes (Signed)
ED Provider at bedside. 

## 2018-08-27 NOTE — ED Triage Notes (Addendum)
Pt A&Ox4 upon ems arrival. Pt reports he was walking to the store when he had a seizure and fell down into a ditch. Pt reports he hit his head on the pavement and takes 81mg  asa every day. EMS BP 130/70, P 108, 89% room air, CBG 213 with ems. Pt reports he just wants to go home. Hx of seizures & COPD. Pt lives at [facility], they are his legal guardian.

## 2018-08-27 NOTE — ED Notes (Signed)
Pt 90% on room air. Placed on 2L [place]. Pt reports he was recently treated for pneumonia two weeks ago and finished antibiotics.
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
## 2018-08-28 ENCOUNTER — Other Ambulatory Visit: Payer: Self-pay | Admitting: Psychiatry

## 2018-08-28 ENCOUNTER — Encounter (HOSPITAL_COMMUNITY): Payer: Self-pay | Admitting: Internal Medicine

## 2018-08-28 ENCOUNTER — Other Ambulatory Visit: Payer: Self-pay

## 2018-08-28 DIAGNOSIS — G252 Other specified forms of tremor: Secondary | ICD-10-CM | POA: Diagnosis present

## 2018-08-28 DIAGNOSIS — E871 Hypo-osmolality and hyponatremia: Secondary | ICD-10-CM | POA: Diagnosis present

## 2018-08-28 DIAGNOSIS — Z89422 Acquired absence of other left toe(s): Secondary | ICD-10-CM | POA: Diagnosis not present

## 2018-08-28 DIAGNOSIS — R740 Nonspecific elevation of levels of transaminase and lactic acid dehydrogenase [LDH]: Secondary | ICD-10-CM | POA: Diagnosis present

## 2018-08-28 DIAGNOSIS — E872 Acidosis: Secondary | ICD-10-CM | POA: Diagnosis present

## 2018-08-28 DIAGNOSIS — N183 Chronic kidney disease, stage 3 (moderate): Secondary | ICD-10-CM | POA: Diagnosis present

## 2018-08-28 DIAGNOSIS — R739 Hyperglycemia, unspecified: Secondary | ICD-10-CM | POA: Diagnosis present

## 2018-08-28 DIAGNOSIS — Z79899 Other long term (current) drug therapy: Secondary | ICD-10-CM | POA: Diagnosis not present

## 2018-08-28 DIAGNOSIS — K449 Diaphragmatic hernia without obstruction or gangrene: Secondary | ICD-10-CM | POA: Diagnosis present

## 2018-08-28 DIAGNOSIS — I129 Hypertensive chronic kidney disease with stage 1 through stage 4 chronic kidney disease, or unspecified chronic kidney disease: Secondary | ICD-10-CM | POA: Diagnosis present

## 2018-08-28 DIAGNOSIS — J189 Pneumonia, unspecified organism: Secondary | ICD-10-CM | POA: Diagnosis present

## 2018-08-28 DIAGNOSIS — F17210 Nicotine dependence, cigarettes, uncomplicated: Secondary | ICD-10-CM | POA: Diagnosis present

## 2018-08-28 DIAGNOSIS — G40909 Epilepsy, unspecified, not intractable, without status epilepticus: Secondary | ICD-10-CM | POA: Diagnosis present

## 2018-08-28 DIAGNOSIS — J440 Chronic obstructive pulmonary disease with acute lower respiratory infection: Secondary | ICD-10-CM | POA: Diagnosis present

## 2018-08-28 DIAGNOSIS — M6282 Rhabdomyolysis: Secondary | ICD-10-CM | POA: Diagnosis present

## 2018-08-28 DIAGNOSIS — N189 Chronic kidney disease, unspecified: Secondary | ICD-10-CM

## 2018-08-28 DIAGNOSIS — R6510 Systemic inflammatory response syndrome (SIRS) of non-infectious origin without acute organ dysfunction: Secondary | ICD-10-CM | POA: Diagnosis present

## 2018-08-28 DIAGNOSIS — J9811 Atelectasis: Secondary | ICD-10-CM | POA: Diagnosis present

## 2018-08-28 DIAGNOSIS — N179 Acute kidney failure, unspecified: Secondary | ICD-10-CM | POA: Diagnosis present

## 2018-08-28 DIAGNOSIS — R0603 Acute respiratory distress: Secondary | ICD-10-CM | POA: Insufficient documentation

## 2018-08-28 DIAGNOSIS — Z888 Allergy status to other drugs, medicaments and biological substances status: Secondary | ICD-10-CM | POA: Diagnosis not present

## 2018-08-28 DIAGNOSIS — K219 Gastro-esophageal reflux disease without esophagitis: Secondary | ICD-10-CM | POA: Diagnosis present

## 2018-08-28 DIAGNOSIS — T17890A Other foreign object in other parts of respiratory tract causing asphyxiation, initial encounter: Secondary | ICD-10-CM | POA: Diagnosis present

## 2018-08-28 DIAGNOSIS — T796XXA Traumatic ischemia of muscle, initial encounter: Secondary | ICD-10-CM

## 2018-08-28 DIAGNOSIS — Z7951 Long term (current) use of inhaled steroids: Secondary | ICD-10-CM | POA: Diagnosis not present

## 2018-08-28 DIAGNOSIS — F3113 Bipolar disorder, current episode manic without psychotic features, severe: Secondary | ICD-10-CM

## 2018-08-28 DIAGNOSIS — F250 Schizoaffective disorder, bipolar type: Secondary | ICD-10-CM | POA: Diagnosis present

## 2018-08-28 DIAGNOSIS — Z880 Allergy status to penicillin: Secondary | ICD-10-CM | POA: Diagnosis not present

## 2018-08-28 DIAGNOSIS — R7401 Elevation of levels of liver transaminase levels: Secondary | ICD-10-CM | POA: Diagnosis present

## 2018-08-28 LAB — CBC
HEMATOCRIT: 39.6 % (ref 39.0–52.0)
HEMOGLOBIN: 13.1 g/dL (ref 13.0–17.0)
MCH: 30.3 pg (ref 26.0–34.0)
MCHC: 33.1 g/dL (ref 30.0–36.0)
MCV: 91.7 fL (ref 80.0–100.0)
NRBC: 0 % (ref 0.0–0.2)
Platelets: 233 10*3/uL (ref 150–400)
RBC: 4.32 MIL/uL (ref 4.22–5.81)
RDW: 13 % (ref 11.5–15.5)
WBC: 10.8 10*3/uL — AB (ref 4.0–10.5)

## 2018-08-28 LAB — COMPREHENSIVE METABOLIC PANEL
ALBUMIN: 3.1 g/dL — AB (ref 3.5–5.0)
ALT: 95 U/L — ABNORMAL HIGH (ref 0–44)
AST: 394 U/L — AB (ref 15–41)
Alkaline Phosphatase: 64 U/L (ref 38–126)
Anion gap: 11 (ref 5–15)
BILIRUBIN TOTAL: 0.8 mg/dL (ref 0.3–1.2)
BUN: 12 mg/dL (ref 8–23)
CHLORIDE: 109 mmol/L (ref 98–111)
CO2: 22 mmol/L (ref 22–32)
Calcium: 8.2 mg/dL — ABNORMAL LOW (ref 8.9–10.3)
Creatinine, Ser: 1.1 mg/dL (ref 0.61–1.24)
GFR calc Af Amer: >60 mL/min (ref >60)
GFR calc non Af Amer: >60 mL/min (ref >60)
GLUCOSE: 119 mg/dL — AB (ref 70–99)
POTASSIUM: 3.5 mmol/L (ref 3.5–5.1)
SODIUM: 142 mmol/L (ref 135–145)
TOTAL PROTEIN: 5.2 g/dL — AB (ref 6.5–8.1)

## 2018-08-28 LAB — URINE CULTURE

## 2018-08-28 LAB — CK: CK TOTAL: 5771 U/L — AB (ref 49–397)

## 2018-08-28 LAB — GLUCOSE, CAPILLARY
GLUCOSE-CAPILLARY: 105 mg/dL — AB (ref 70–99)
GLUCOSE-CAPILLARY: 128 mg/dL — AB (ref 70–99)
GLUCOSE-CAPILLARY: 212 mg/dL — AB (ref 70–99)
GLUCOSE-CAPILLARY: 85 mg/dL (ref 70–99)
Glucose-Capillary: 98 mg/dL (ref 70–99)

## 2018-08-28 LAB — HEMOGLOBIN A1C
Hgb A1c MFr Bld: 5.1 % (ref 4.8–5.6)
MEAN PLASMA GLUCOSE: 99.67 mg/dL

## 2018-08-28 LAB — MRSA PCR SCREENING: MRSA by PCR: NEGATIVE

## 2018-08-28 LAB — MAGNESIUM: Magnesium: 2.4 mg/dL (ref 1.7–2.4)

## 2018-08-28 LAB — HIV ANTIBODY (ROUTINE TESTING W REFLEX): HIV Screen 4th Generation wRfx: NONREACTIVE

## 2018-08-28 LAB — TSH: TSH: 0.454 u[IU]/mL (ref 0.350–4.500)

## 2018-08-28 MED ORDER — TAMSULOSIN HCL 0.4 MG PO CAPS
0.4000 mg | ORAL_CAPSULE | Freq: Every day | ORAL | Status: DC
  Administered 2018-08-28 – 2018-08-31 (×4): 0.4 mg via ORAL
  Filled 2018-08-28 (×4): qty 1

## 2018-08-28 MED ORDER — ARFORMOTEROL TARTRATE 15 MCG/2ML IN NEBU
15.0000 ug | INHALATION_SOLUTION | Freq: Two times a day (BID) | RESPIRATORY_TRACT | Status: DC
  Administered 2018-08-28 – 2018-08-31 (×8): 15 ug via RESPIRATORY_TRACT
  Filled 2018-08-28 (×8): qty 2

## 2018-08-28 MED ORDER — POLYETHYLENE GLYCOL 3350 17 G PO PACK
17.0000 g | PACK | Freq: Two times a day (BID) | ORAL | Status: DC
  Administered 2018-08-28 – 2018-08-31 (×5): 17 g via ORAL
  Filled 2018-08-28 (×5): qty 1

## 2018-08-28 MED ORDER — ACETAMINOPHEN 325 MG PO TABS
650.0000 mg | ORAL_TABLET | Freq: Four times a day (QID) | ORAL | Status: DC | PRN
  Administered 2018-08-31: 650 mg via ORAL

## 2018-08-28 MED ORDER — LEVETIRACETAM 500 MG PO TABS
1000.0000 mg | ORAL_TABLET | Freq: Two times a day (BID) | ORAL | Status: DC
  Administered 2018-08-28 – 2018-08-31 (×7): 1000 mg via ORAL
  Filled 2018-08-28: qty 2
  Filled 2018-08-28: qty 4
  Filled 2018-08-28 (×5): qty 2
  Filled 2018-08-28: qty 4

## 2018-08-28 MED ORDER — BUDESONIDE 0.5 MG/2ML IN SUSP
0.5000 mg | Freq: Two times a day (BID) | RESPIRATORY_TRACT | Status: DC
  Administered 2018-08-28 – 2018-08-31 (×8): 0.5 mg via RESPIRATORY_TRACT
  Filled 2018-08-28 (×8): qty 2

## 2018-08-28 MED ORDER — ASPIRIN EC 81 MG PO TBEC
81.0000 mg | DELAYED_RELEASE_TABLET | Freq: Every day | ORAL | Status: DC
  Administered 2018-08-28 – 2018-08-31 (×4): 81 mg via ORAL
  Filled 2018-08-28 (×4): qty 1

## 2018-08-28 MED ORDER — GUAIFENESIN 100 MG/5ML PO SOLN
5.0000 mL | ORAL | Status: DC | PRN
  Administered 2018-08-28 – 2018-08-30 (×7): 100 mg via ORAL
  Filled 2018-08-28 (×2): qty 5
  Filled 2018-08-28: qty 10
  Filled 2018-08-28 (×2): qty 5
  Filled 2018-08-28: qty 25
  Filled 2018-08-28: qty 10

## 2018-08-28 MED ORDER — CLONAZEPAM 0.25 MG PO TBDP
0.5000 mg | ORAL_TABLET | Freq: Two times a day (BID) | ORAL | Status: DC
  Administered 2018-08-28 – 2018-08-31 (×7): 0.5 mg via ORAL
  Filled 2018-08-28 (×7): qty 2

## 2018-08-28 MED ORDER — POTASSIUM CHLORIDE CRYS ER 20 MEQ PO TBCR
40.0000 meq | EXTENDED_RELEASE_TABLET | Freq: Once | ORAL | Status: AC
  Administered 2018-08-28: 40 meq via ORAL
  Filled 2018-08-28: qty 2

## 2018-08-28 MED ORDER — B COMPLEX VITAMINS PO CAPS: 1.0000 | ORAL_CAPSULE | Freq: Every day | ORAL | Status: DC

## 2018-08-28 MED ORDER — LACTULOSE 10 GM/15ML PO SOLN
10.0000 g | Freq: Every day | ORAL | Status: DC
  Administered 2018-08-28 – 2018-08-31 (×4): 10 g via ORAL
  Filled 2018-08-28 (×4): qty 30

## 2018-08-28 MED ORDER — OLANZAPINE 10 MG PO TABS
20.0000 mg | ORAL_TABLET | Freq: Two times a day (BID) | ORAL | Status: DC
  Administered 2018-08-28 – 2018-08-31 (×7): 20 mg via ORAL
  Filled 2018-08-28: qty 2
  Filled 2018-08-28: qty 8
  Filled 2018-08-28 (×5): qty 2
  Filled 2018-08-28: qty 8
  Filled 2018-08-28: qty 2

## 2018-08-28 MED ORDER — L-METHYLFOLATE-B6-B12 3-35-2 MG PO TABS
1.0000 | ORAL_TABLET | Freq: Every day | ORAL | Status: DC
  Administered 2018-08-28 – 2018-08-31 (×4): 1 via ORAL
  Filled 2018-08-28 (×4): qty 1

## 2018-08-28 MED ORDER — ALBUTEROL SULFATE (2.5 MG/3ML) 0.083% IN NEBU
3.0000 mL | INHALATION_SOLUTION | Freq: Four times a day (QID) | RESPIRATORY_TRACT | Status: DC
  Administered 2018-08-28: 3 mL via RESPIRATORY_TRACT
  Filled 2018-08-28: qty 3

## 2018-08-28 MED ORDER — SENNA 8.6 MG PO TABS
1.0000 | ORAL_TABLET | Freq: Two times a day (BID) | ORAL | Status: DC
  Administered 2018-08-28 – 2018-08-30 (×6): 8.6 mg via ORAL
  Filled 2018-08-28 (×6): qty 1

## 2018-08-28 MED ORDER — FAMOTIDINE 20 MG PO TABS
20.0000 mg | ORAL_TABLET | Freq: Two times a day (BID) | ORAL | Status: DC
  Administered 2018-08-28 – 2018-08-31 (×7): 20 mg via ORAL
  Filled 2018-08-28 (×7): qty 1

## 2018-08-28 MED ORDER — VITAMIN D 1000 UNITS PO TABS
2000.0000 [IU] | ORAL_TABLET | Freq: Every day | ORAL | Status: DC
  Administered 2018-08-29 – 2018-08-31 (×3): 2000 [IU] via ORAL
  Filled 2018-08-28 (×5): qty 2

## 2018-08-28 MED ORDER — LOXAPINE SUCCINATE 5 MG PO CAPS
25.0000 mg | ORAL_CAPSULE | Freq: Three times a day (TID) | ORAL | Status: DC
  Administered 2018-08-28 – 2018-08-31 (×9): 25 mg via ORAL
  Filled 2018-08-28 (×12): qty 5

## 2018-08-28 MED ORDER — VITAMIN D (ERGOCALCIFEROL) 50 MCG (2000 UT) PO CAPS: 2000.0000 [IU] | ORAL_CAPSULE | Freq: Every day | ORAL | Status: DC

## 2018-08-28 MED ORDER — LATANOPROST 0.005 % OP SOLN
1.0000 [drp] | Freq: Every day | OPHTHALMIC | Status: DC
  Administered 2018-08-28 – 2018-08-30 (×3): 1 [drp] via OPHTHALMIC
  Filled 2018-08-28 (×2): qty 2.5

## 2018-08-28 NOTE — Progress Notes (Signed)
O2 sat 94% on room air; ambulated approximately 100 feet.  O2 sat dropped to 84% once back to room; 99% on 2L/O2.

## 2018-08-28 NOTE — Consult Note (Signed)
  ECT: This is a note to document our current situation regarding this patient who had been scheduled for ECT treatment at [facility] this upcoming Monday.  I have not met the patient but have reviewed a lot of notes from [facility] and this morning I spoke to Dr.Jerrett, who was the patient's primary doctor during his extended stay at [facility].  She tells me that as far as she knows and as far as can be seen in the documentation, he did not ever have an actual seizure during [facility]  However, she tells me that he has been known to have several mechanical falls in the past, that he had been placed on falls precautions at times, and that he also has a history of behaviors consisting of intentionally putting himself on the floor for attention.  She also tells me that the patient is well known to be attention seeking around medical issues.  I had been involved because we were asked to provide maintenance,  outpatient, ECT treatment for this patient when he was discharged to the community.  At this point however, because he has been admitted to [facility], we are going to cancel the ECT appointment for Monday.  I am still agreeable to providing maintenance ECT treatment, but I will need to speak to the manager of his group home, Jamison Neighbor, as well as his guardian, Celene Skeen at [employer].  I will also add myself as a provider on this hospitalization so that I will be able to easily monitor when he is being discharged from the hospital, which I believe is appropriate given that I am supposed to be providing maintenance medical treatment for him.  If I can be of any other assistance please do not hesitate to get in touch with me. John Terry ClapacsM.[facility] psychiatry

## 2018-08-28 NOTE — ED Notes (Signed)
Pt reports drinking his Coke too fast and had an episode of vomiting.

## 2018-08-28 NOTE — Progress Notes (Signed)
[facility] PROGRESS NOTE  Jason Patterson VVZ:[id number] DOB: 02-Oct-1956 DOA: 08/27/2018 PCP: [facility]  Assessment/Plan: Suspected seizure/seizure disorder/fall: Acute.  Patient reports having seizures since the age of 1 reports being on Keppra thousand milligrams twice daily.  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  Urine drug screen and alcohol levels were undetectable. Patient spent last 2 years at [facility]. See note dr Weber Cooks dated 10/11. In short patient had no seizure for 2 years at [facility]. He did have frequent mechanical falls and a history of intentionally putting himself on floor. Even so, has abrasion on forehead and elevated CK. No report of incontinence - continue Seizure precautions - Ativan IV prn seizure activity -  continue Keppra 1000 mg x 1 dose now - Normal saline IV fluids at 75 mL/h -Physical therapy -continue IV fluids  SIRS: Patient met SIRS criteria based off tachycardia, tachypneic, WBC elevated at 12.9, and elevated lactic acid level. WBC trending down today. Lactic acid now within limits of norma. Urinalysis showed no signs of infection.  Symptoms could all be related with possible seizure versus pneumonia. CT chest with Mucous plugging within the left lower lobe airways with left lower lobe atelectasis or early infiltrate. No PE - Follow-up sputum and blood cultures - started on maxapine and vanc day #2. - appreciate assistance/insight Dr. Weber Cooks -consider narrowing antibiotics  Respiratory distress/COPD/?PNA. Resolved. Patient with hx of copd. Not on oxygen at home.  Patient did have O2 saturations as low as 89% on room air by EMS. CT chest as noted above.  Recently discharged from [facility] with reports of pneumonia treated.  Imaging studies show a left lower lobe atelectasis/early pneumonia.  Patient was started on empiric  antibiotics. - Wean oxygen -continue nebs and home inhalers -flutter valve - Incentive spirometry. - Brovana and budesonide  Lactic acidosis metabolic acidosis with elevated anion gap: Acute.  Initial lactic acid elevated at 8.37 on admission with CO2 13 and anion gap mildly elevated at 16.  Suggest symptoms could be secondary to recent seizure versus underlying infection.  After initial fluids lactic acidosis resolved. - Monitor for resolution of metabolic acidosis  Hyperglycemia: Acute. No hx DM. Resolved today. Initial glucose elevated to 233 on admission.  Could be secondary to acute distress. HgA1c 5.1. - Hypoglycemic protocols - Hemoglobin A1c in a.m. - CBGs before meals and at bedtime with sensitive SSI   Hyponatremia: Sodium level noted to be 129 on admission. Resolved this am.   This can be related to possible medications and/or dehydration. - IV fluids as seen above - Follow-up repeat sodium levels in a.m.  Acute kidney injury versus chronic kidney disease stage III: Initial creatinine noted to be 1.39 on admission with BUN 16.  Last creatinine from 2017 noted to be 1.02. CK level was just mildly elevated at greater than 5000. - IV fluids overnight  -intake and output - Recheck kidney function and   Schizoaffective disorder -appears stable at baseline -resume home meds  Essential hypertension - Restart home medications   Legal guardian: Will likely need to reach out to contact legal guardian in a.m., but was unable available overnight.  Tobacco abuse  - Nicotine patch - Counsel need of cessation of tobacco  Code Status: full Family Communication: left message for Celene Skeen [phone] ext. [phone] Disposition Plan: back to group home    Consultants:  none  Procedures:  none  Antibiotics:  vanc 10/10 >>>  maxapine 10/10>>  HPI/Subjective: Complains headache this am. Denies any other discomfort  62 yo M with a chief complaint of falling  down.  He stated he was walking and lost his balance and fell into a ditch.  The patient is unsure exactly what happened.  He has a history of seizures and believed that there is a possibility that he had one.  He denied chest pain shortness of breath cough or fever.  Patient was found to be tachypneic with a oxygen saturation in the low 80s.  Placed on oxygen with improvement.  Concern for pneumonia with a lactate level of 8.7.  Started on broad-spectrum antibiotics.     Objective: Vitals:   08/28/18 0719 08/28/18 1152  BP: 111/80 107/81  Pulse: 82 91  Resp: 16 17  Temp: 98.4 F (36.9 C) 98.4 F (36.9 C)  SpO2: 96% 98%    Intake/Output Summary (Last 24 hours) at 08/28/2018 1248 Last data filed at 08/28/2018 1151 Gross per 24 hour  Intake 1932.08 ml  Output 651 ml  Net 1281.08 ml   Filed Weights   08/27/18 1550  Weight: 78 kg    Exam:   General:  Sitting up in bed watching TV  Cardiovascular: RRR no mgr no LE edema  Respiratory: mild increased work of breathing with conversation. BS coarse no crackles  Abdomen: flat soft +BS no guarding or rebounding  Musculoskeletal: joints without swelling/erythema   Data Reviewed: Basic Metabolic Panel: Recent Labs  Lab 08/27/18 1606 08/28/18 0356  NA 129* 142  K 3.9 3.5  CL 100 109  CO2 13* 22  GLUCOSE 233* 119*  BUN 16 12  CREATININE 1.39* 1.10  CALCIUM 8.7* 8.2*  MG  --  2.4   Liver Function Tests: Recent Labs  Lab 08/28/18 0356  AST 394*  ALT 95*  ALKPHOS 64  BILITOT 0.8  PROT 5.2*  ALBUMIN 3.1*   No results for input(s): LIPASE, AMYLASE in the last 168 hours. No results for input(s): AMMONIA in the last 168 hours. CBC: Recent Labs  Lab 08/27/18 1606 08/28/18 0356  WBC 12.4* 10.8*  NEUTROABS 11.6*  --   HGB 12.9* 13.1  HCT 38.4* 39.6  MCV 91.6 91.7  PLT 235 233   Cardiac Enzymes: Recent Labs  Lab 08/27/18 1606 08/28/18 0356  CKTOTAL 440* 5,771*   BNP (last 3 results) No results for  input(s): BNP in the last 8760 hours.  ProBNP (last 3 results) No results for input(s): PROBNP in the last 8760 hours.  CBG: Recent Labs  Lab 08/27/18 1606 08/28/18 0050 08/28/18 0640 08/28/18 1127  GLUCAP 229* 212* 128* 105*    Recent Results (from the past 240 hour(s))  MRSA PCR Screening     Status: None   Collection Time: 08/28/18  7:47 AM  Result Value Ref Range Status   MRSA by PCR NEGATIVE NEGATIVE Final    Comment:        The GeneXpert MRSA Assay (FDA approved for NASAL specimens only), is one component of a comprehensive MRSA colonization surveillance program. It is not intended to diagnose MRSA infection nor to guide or monitor treatment for MRSA infections. Performed at [facility], [place] [address] [place], [place] [address]      Studies: Ct Head Wo Contrast  Result Date: 08/27/2018 CLINICAL DATA:  Pt reports he was walking to the store when he had a seizure and fell down into a ditch. Pt reports he  hit his head on the pavement and takes 37m aspirin every day, hx of seizures EXAM: CT HEAD WITHOUT CONTRAST TECHNIQUE: Contiguous axial images were obtained from the base of the skull through the vertex without intravenous contrast. COMPARISON:  03/18/2016 FINDINGS: Brain: There is mild central and cortical atrophy. There is no intra or extra-axial fluid collection or mass lesion. The basilar cisterns and ventricles have a normal appearance. There is no CT evidence for acute infarction or hemorrhage. There is an old focal lacunar infarct in the RIGHT basal ganglia, stable in appearance. Vascular: There is atherosclerotic calcification of the internal carotid arteries. No hyperdense vessels. Skull: Normal. Negative for fracture or focal lesion. Sinuses/Orbits: No acute finding. Other: None IMPRESSION: 1.  No evidence for acute intracranial abnormality. 2. Central cortical atrophy. 3. 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 Electronically  Signed   By: ENolon NationsM.D.   On: 08/27/2018 18:36   Ct Angio Chest Pe W And/or Wo Contrast  Result Date: 08/27/2018 CLINICAL DATA:  Shortness of breath EXAM: CT ANGIOGRAPHY CHEST WITH CONTRAST TECHNIQUE: Multidetector CT imaging of the chest was performed using the standard protocol during bolus administration of intravenous contrast. Multiplanar CT image reconstructions and MIPs were obtained to evaluate the vascular anatomy. CONTRAST:  65 mL ISOVUE-370 IOPAMIDOL (ISOVUE-370) INJECTION 76% COMPARISON:  04/01/2015 FINDINGS: Cardiovascular: No filling defects in the pulmonary arteries to suggest pulmonary emboli. Heart is normal size. Scattered coronary artery and aortic calcifications. No evidence of aortic aneurysm. Mediastinum/Nodes: Moderate hiatal hernia. No mediastinal, hilar, or axillary adenopathy. Lungs/Pleura: Moderate emphysema. Bilateral calcified nodules compatible with old granulomatous disease. Mucous plugging within the left lower lobe airways. Airspace disease at the left base could reflect atelectasis or early infiltrate. Dependent right base atelectasis. No effusions. Upper Abdomen: Imaging into the upper abdomen shows no acute findings. Musculoskeletal: Chest wall soft tissues are unremarkable. No acute bony abnormality. Review of the MIP images confirms the above findings. IMPRESSION: No evidence of pulmonary embolus. Coronary artery disease. Mucous plugging within the left lower lobe airways with left lower lobe atelectasis or early infiltrate. Right base atelectasis. Old granulomatous disease. Moderate emphysema. Moderate hiatal hernia. Electronically Signed   By: KRolm BaptiseM.D.   On: 08/27/2018 19:16   Dg Chest Portable 1 View  Result Date: 08/27/2018 CLINICAL DATA:  Shortness of breath EXAM: PORTABLE CHEST 1 VIEW COMPARISON:  02/23/2016 FINDINGS: Bilateral small calcified pulmonary nodules likely reflecting sequela prior granulomatous disease. There is mild left basilar  atelectasis. This there is no focal consolidation. There is no pleural effusion or pneumothorax. The heart and mediastinal contours are unremarkable. The osseous structures are unremarkable. IMPRESSION: No acute cardiopulmonary disease. Electronically Signed   By: HKathreen Devoid  On: 08/27/2018 16:53    Scheduled Meds: . albuterol  1 puff Inhalation QID  . arformoterol  15 mcg Nebulization BID  . aspirin EC  81 mg Oral Daily  . b complex vitamins  1 capsule Oral Daily  . budesonide (PULMICORT) nebulizer solution  0.5 mg Nebulization BID  . clonazepam  0.5 mg Oral BID  . enoxaparin (LOVENOX) injection  40 mg Subcutaneous Daily  . famotidine  20 mg Oral BID  . insulin aspart  0-5 Units Subcutaneous QHS  . insulin aspart  0-9 Units Subcutaneous TID WC  . l-methylfolate-B6-B12  1 tablet Oral Daily  . lactulose  10 g Oral Daily  . latanoprost  1 drop Both Eyes QHS  . levETIRAcetam  1,000  mg Oral BID  . loxapine  25 mg Oral TID  . nicotine  21 mg Transdermal Daily  . OLANZapine  20 mg Oral BID  . polyethylene glycol  17 g Oral BID  . senna  1 tablet Oral BID  . sodium chloride flush  3 mL Intravenous Q12H  . tamsulosin  0.4 mg Oral Daily  . Vitamin D (Ergocalciferol)  2,000 Units Oral Daily   Continuous Infusions: . sodium chloride 75 mL/hr at 08/28/18 0142  . ceFEPime (MAXIPIME) IV 1 g (08/28/18 0951)  . vancomycin 750 mg (08/28/18 0509)    Principal Problem:   Rhabdomyolysis Active Problems:   Bipolar disorder (HCC)   Essential hypertension   COPD (chronic obstructive pulmonary disease) (HCC)   Schizoaffective disorder, bipolar type (HCC)   SIRS (systemic inflammatory response syndrome) (HCC)   Hyperglycemia   Elevated transaminase level   Seizure (HCC)   Hyponatremia   Acute kidney injury superimposed on chronic kidney disease ([place])    Time spent: 40 minutes    [facility] [place] [place] [facility]  If 7PM-7AM, please contact night-coverage at [email or web address], password  [facility] 08/28/2018, 12:48 PM  LOS: 0 days

## 2018-08-28 NOTE — Clinical Social Work Note (Signed)
Clinical Social Work Assessment  Patient Details  Name: Jason Patterson MRN: [id number] Date of Birth: 1956-05-02  Date of referral:  08/28/18               Reason for consult:  Facility Placement                Permission sought to share information with:  Facility Sport and exercise psychologist, Family Supports Permission granted to share information::     Name::     Jason Patterson  Agency::  [facility]  Relationship::  Care home owner, Legal guardian  Contact Information:     Housing/Transportation Living arrangements for the past 2 months:  [place] of Information:  Patient, Medical Team, Facility Patient Interpreter Needed:  None Criminal Activity/Legal Involvement Pertinent to Current Situation/Hospitalization:  No - Comment as needed Significant Relationships:  None Lives with:  Self, Facility Resident Do you feel safe going back to the place where you live?  Yes Need for family participation in patient care:  Yes (Comment)  Care giving concerns:  Patient just moved into a family care home yesterday. Prior to admitting into the home, patient had been at [facility] for two years. He reports no current concerns with care home that he's moved into. Patient has a legal guardian.   Social Worker assessment / plan:  CSW met with patient to receive information on where he was living and who to reach out to. Patient had a piece of paper with important numbers on it in his front shirt pocket; CSW made a copy and replaced the paper back in patient's shirt pocket. CSW contacted patient's legal guardian; she is in a meeting and will call CSW back. CSW called care home. Patient's aide answered that he just moved in yesterday but they were willing to take him back whenever he was ready. Aide is unsure if the home will provide transport, CSW will need to contact the owner of the home, Stanton Kidney, when patient is ready to discuss his return.   Employment status:   Retired Forensic scientist:  Medicaid In [place] PT Recommendations:  Not assessed at this time Information / Referral to community resources:     Patient/Family's Response to care:  Patient agreeable to return to his care home.  Patient/Family's Understanding of and Emotional Response to Diagnosis, Current Treatment, and Prognosis:  Patient said he was feeling alright today, and that he had all of his important information on a paper in his pocket. Patient said he needs the paper for all of his follow up appointments and everything, so he can't lose it. Patient was aware that he will have upcoming ECT appointments and other mental health appointments that he has scheduled to help him in the community. Patient appreciative of CSW assistance.  Emotional Assessment Appearance:  Appears stated age Attitude/Demeanor/Rapport:  Engaged Affect (typically observed):  Pleasant Orientation:  Oriented to Self, Oriented to Place, Oriented to  Time, Oriented to Situation Alcohol / Substance use:  Not Applicable Psych involvement (Current and /or in the community):  No (Comment)  Discharge Needs  Concerns to be addressed:  Care Coordination Readmission within the last 30 days:  No Current discharge risk:  Psychiatric Illness Barriers to Discharge:  Continued Medical Work up   [employer], [place] 08/28/2018, 3:55 PM

## 2018-08-29 DIAGNOSIS — F250 Schizoaffective disorder, bipolar type: Secondary | ICD-10-CM

## 2018-08-29 LAB — GLUCOSE, CAPILLARY
GLUCOSE-CAPILLARY: 140 mg/dL — AB (ref 70–99)
GLUCOSE-CAPILLARY: 148 mg/dL — AB (ref 70–99)
GLUCOSE-CAPILLARY: 90 mg/dL (ref 70–99)
Glucose-Capillary: 64 mg/dL — ABNORMAL LOW (ref 70–99)
Glucose-Capillary: 71 mg/dL (ref 70–99)

## 2018-08-29 LAB — CBC
HCT: 34 % — ABNORMAL LOW (ref 39.0–52.0)
Hemoglobin: 11.2 g/dL — ABNORMAL LOW (ref 13.0–17.0)
MCH: 30.6 pg (ref 26.0–34.0)
MCHC: 32.9 g/dL (ref 30.0–36.0)
MCV: 92.9 fL (ref 80.0–100.0)
NRBC: 0 % (ref 0.0–0.2)
PLATELETS: 192 10*3/uL (ref 150–400)
RBC: 3.66 MIL/uL — AB (ref 4.22–5.81)
RDW: 13.4 % (ref 11.5–15.5)
WBC: 8 10*3/uL (ref 4.0–10.5)

## 2018-08-29 LAB — COMPREHENSIVE METABOLIC PANEL
ALK PHOS: 53 U/L (ref 38–126)
ALT: 105 U/L — AB (ref 0–44)
ANION GAP: 6 (ref 5–15)
AST: 282 U/L — ABNORMAL HIGH (ref 15–41)
Albumin: 2.7 g/dL — ABNORMAL LOW (ref 3.5–5.0)
BILIRUBIN TOTAL: 0.6 mg/dL (ref 0.3–1.2)
BUN: 11 mg/dL (ref 8–23)
CALCIUM: 8 mg/dL — AB (ref 8.9–10.3)
CO2: 20 mmol/L — AB (ref 22–32)
CREATININE: 1.01 mg/dL (ref 0.61–1.24)
Chloride: 112 mmol/L — ABNORMAL HIGH (ref 98–111)
Glucose, Bld: 117 mg/dL — ABNORMAL HIGH (ref 70–99)
Potassium: 3.8 mmol/L (ref 3.5–5.1)
Sodium: 138 mmol/L (ref 135–145)
TOTAL PROTEIN: 4.8 g/dL — AB (ref 6.5–8.1)

## 2018-08-29 LAB — CK: Total CK: 10192 U/L — ABNORMAL HIGH (ref 49–397)

## 2018-08-29 NOTE — Progress Notes (Addendum)
PROGRESS NOTE    Jason Patterson  SEG:[id number] DOB: 09-03-1956 DOA: 08/27/2018 PCP: [facility]    Brief Narrative:  62 yo M with a chief complaint of falling down. He stated he was walking and lost his balance and fell into a ditch. The patient is unsure exactly what happened. He has a history of seizures and believed that there is a possibility that he had one. He denied chest pain shortness of breath cough or fever. Patient was found to be tachypneic with a oxygen saturation in the low 80s. Placed on oxygen with improvement. Concern for pneumonia with a lactate level of 8.7. Started on broad-spectrum antibiotics  Assessment & Plan:   Principal Problem:   Rhabdomyolysis Active Problems:   Bipolar disorder ([place])   Essential hypertension   COPD (chronic obstructive pulmonary disease) (HCC)   Schizoaffective disorder, bipolar type (HCC)   SIRS (systemic inflammatory response syndrome) (HCC)   Seizure (HCC)   Hyperglycemia   Hyponatremia   Acute kidney injury superimposed on chronic kidney disease (HCC)   Elevated transaminase level  Suspectedseizure/seizure disorder/fall: Acute. Patient reports having seizures since the age of 62 reports being on Keppra thousand milligrams twice daily. 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 Urine drug screen and alcohol levels were undetectable. Patient spent last 2 years at [facility]. See note dr Weber Cooks dated 10/11. In short patient had no seizure for 2 years at [facility]. He did have frequent mechanical falls and a history of intentionally putting himself on floor. Pt noted to have abrasion on forehead and elevated CK. No report of incontinence - continueSeizure precautions -AtivanIV prnseizure activity -On keppra 1gm bid -Physical therapy consulted  SIRS: Patient met SIRS criteria based off tachycardia, tachypneic, WBC elevated  at 12.9, and elevated lactic acid level. WBC trending down today. Lactic acid now within limits of norma. Urinalysis showed no signs of infection. Symptoms could all be related with possible seizure versus pneumonia. CT chest with Mucous plugging within the left lower lobe airways with left lower lobe atelectasis or early infiltrate. No PE -Blood cx with likely contaminant gpc -started on maxapine and vanc day #2. -MRSA swab neg. Will d/c vanc and continue on cefepime  Respiratory distress/COPD/?PNA. Resolved. Patient with hx of copd. Not on oxygen at home. Patient did have O2 saturations as low as 89% on room air by EMS. CT chest as noted above. Recently discharged from [facility] with reports of pneumonia treated. Imaging studies show a left lower lobe atelectasis/early pneumonia. Patient was started on empiric antibiotics per above. -Cont to wean O2 as tolerated -continue nebs and home inhalers -flutter valve -Incentive spirometry. -Continue with Brovana and budesonide  Lactic acidosis metabolic acidosis with elevated anion gap: Acute. Initial lactic acid elevated at 8.37 on admission with CO2 13 and anion gap mildly elevated at 16. Suggest symptoms could be secondary to recent seizure versus underlying infection. After initial fluids lactic acidosis resolved. -Serum bicarb today 20. Increased IVF hydration  Hyperglycemia: Acute. No hx DM. Resolved today.Initial glucose elevated to 233 on admission. Could be secondary to acute distress. HgA1c 5.1. -Hypoglycemic protocols -CBGs before meals and at bedtime with sensitive SSI  -Stable at present  Hyponatremia: Sodium level noted to be 129 on admission. Resolved this am.  This can be related to possible medications and/or dehydration. -IV fluids as seen above -Resolved  Acute kidney injury versus chronic kidney disease stage  III: Initial creatinine noted to be 1.39 on admission with BUN 16. Last  creatinine from 2017 noted to be 1.02.  -intake and output -repeat bmet in AM  Rhabdomyolysis -CK trended up from 440 to 10,000 this am -Increase IVF to 125cc/hr -repeat CK in AM  Schizoaffective disorder -appears stable at baseline -continued on home meds  Essential hypertension -Would continue on home medications   Legal guardian: - Updated legal guardian, Celene Skeen . - As of 10/12, guardian gives consent for involuntary commitment if needed and states "just do what you need to do for whatever needs to be done." - All questions answered  Tobacco abuse -Nicotine patch ordered. Per staff, pt has been refusing patch -Counsel need of cessation of tobacco -This AM, pt noted to have cigarettes in pocket as well as butane torch style lighter in his hands as he was sleeping, both of which confiscated for patient and hospital safety, as patient remains on O2   DVT prophylaxis: Lovenox subQ Code Status: Full Family Communication: Patient in room, family not at bedside Disposition Plan: Uncertain at this time  Consultants:     Procedures:     Antimicrobials: Anti-infectives (From admission, onward)   Start     Dose/Rate Route Frequency Ordered Stop   08/28/18 0600  vancomycin (VANCOCIN) IVPB 750 mg/150 ml premix  Status:  Discontinued     750 mg 150 mL/hr over 60 Minutes Intravenous Every 12 hours 08/27/18 1837 08/29/18 0924   08/28/18 0100  ceFEPIme (MAXIPIME) 1 g in sodium chloride 0.9 % 100 mL IVPB     1 g 200 mL/hr over 30 Minutes Intravenous Every 8 hours 08/27/18 1837     08/27/18 1715  ceFEPIme (MAXIPIME) 2 g in sodium chloride 0.9 % 100 mL IVPB     2 g 200 mL/hr over 30 Minutes Intravenous  Once 08/27/18 1707 08/27/18 1749   08/27/18 1715  vancomycin (VANCOCIN) 1,500 mg in sodium chloride 0.9 % 500 mL IVPB     1,500 mg 250 mL/hr over 120 Minutes Intravenous  Once 08/27/18 1707 08/27/18 2026       Subjective: Asleep this AM, difficult to  arouse  Objective: Vitals:   08/29/18 0734 08/29/18 0850 08/29/18 0851 08/29/18 1221  BP: 110/74   104/67  Pulse: 81   80  Resp: 20   20  Temp: 99.1 F (37.3 C)   99.1 F (37.3 C)  TempSrc: Oral   Oral  SpO2: 93% 95% 96% 94%  Weight:      Height:        Intake/Output Summary (Last 24 hours) at 08/29/2018 1238 Last data filed at 08/29/2018 1100 Gross per 24 hour  Intake 4282.19 ml  Output 2625 ml  Net 1657.19 ml   Filed Weights   08/27/18 1550  Weight: 78 kg    Examination:  General exam: Appears calm and comfortable, asleep Respiratory system: Clear to auscultation. Respiratory effort normal. Cardiovascular system: S1 & S2 heard, RRR. Gastrointestinal system: Abdomen is nondistended, soft and nontender. No organomegaly or masses felt. Normal bowel sounds heard. Central nervous system: No tremors. No focal neurological deficits. Extremities: Symmetric 5 x 5 power. Skin: No rashes, lesions  Psychiatry: Unable to assess at this time  Data Reviewed: I have personally reviewed following labs and imaging studies  CBC: Recent Labs  Lab 08/27/18 1606 08/28/18 0356 08/29/18 0406  WBC 12.4* 10.8* 8.0  NEUTROABS 11.6*  --   --   HGB 12.9* 13.1 11.2*  HCT  38.4* 39.6 34.0*  MCV 91.6 91.7 92.9  PLT 235 233 242   Basic Metabolic Panel: Recent Labs  Lab 08/27/18 1606 08/28/18 0356 08/29/18 0406  NA 129* 142 138  K 3.9 3.5 3.8  CL 100 109 112*  CO2 13* 22 20*  GLUCOSE 233* 119* 117*  BUN '16 12 11  ' CREATININE 1.39* 1.10 1.01  CALCIUM 8.7* 8.2* 8.0*  MG  --  2.4  --    GFR: Estimated Creatinine Clearance: 70.6 mL/min (by C-G formula based on SCr of 1.01 mg/dL). Liver Function Tests: Recent Labs  Lab 08/28/18 0356 08/29/18 0406  AST 394* 282*  ALT 95* 105*  ALKPHOS 64 53  BILITOT 0.8 0.6  PROT 5.2* 4.8*  ALBUMIN 3.1* 2.7*   No results for input(s): LIPASE, AMYLASE in the last 168 hours. No results for input(s): AMMONIA in the last 168  hours. Coagulation Profile: No results for input(s): INR, PROTIME in the last 168 hours. Cardiac Enzymes: Recent Labs  Lab 08/27/18 1606 08/28/18 0356 08/29/18 0406  CKTOTAL 440* 5,771* 10,192*   BNP (last 3 results) No results for input(s): PROBNP in the last 8760 hours. HbA1C: Recent Labs    08/28/18 0356  HGBA1C 5.1   CBG: Recent Labs  Lab 08/28/18 1127 08/28/18 1642 08/28/18 2110 08/29/18 0621 08/29/18 1211  GLUCAP 105* 98 85 140* 148*   Lipid Profile: No results for input(s): CHOL, HDL, LDLCALC, TRIG, CHOLHDL, LDLDIRECT in the last 72 hours. Thyroid Function Tests: Recent Labs    08/28/18 0356  TSH 0.454   Anemia Panel: No results for input(s): VITAMINB12, FOLATE, FERRITIN, TIBC, IRON, RETICCTPCT in the last 72 hours. Sepsis Labs: Recent Labs  Lab 08/27/18 1627 08/27/18 2030  LATICACIDVEN 8.37* 1.49    Recent Results (from the past 240 hour(s))  Blood culture (routine x 2)     Status: None (Preliminary result)   Collection Time: 08/27/18  5:04 PM  Result Value Ref Range Status   Specimen Description BLOOD LEFT WRIST  Final   Special Requests   Final    BOTTLES DRAWN AEROBIC AND ANAEROBIC Blood Culture adequate volume   Culture  Setup Time   Final    GRAM POSITIVE RODS AEROBIC BOTTLE ONLY CRITICAL RESULT CALLED TO, READ BACK BY AND VERIFIED WITH: PHARMD T RUDISILL [id number] Performed at [facility], [place] [address] [place], [place] [address]    Culture GRAM POSITIVE RODS  Final   Report Status PENDING  Incomplete  Blood culture (routine x 2)     Status: None (Preliminary result)   Collection Time: 08/27/18  5:15 PM  Result Value Ref Range Status   Specimen Description BLOOD RIGHT HAND  Final   Special Requests   Final    BOTTLES DRAWN AEROBIC AND ANAEROBIC Blood Culture adequate volume   Culture   Final    NO GROWTH 2 DAYS Performed at [facility], [place] [address] [place], [place] [address]    Report Status PENDING   Incomplete  Urine culture     Status: Abnormal   Collection Time: 08/27/18  6:46 PM  Result Value Ref Range Status   Specimen Description URINE, RANDOM  Final   Special Requests   Final    NONE Performed at [facility], [place] [address], [place], [place] [address]    Culture MULTIPLE SPECIES PRESENT, SUGGEST RECOLLECTION (A)  Final   Report Status 08/28/2018 FINAL  Final  MRSA PCR Screening  Status: None   Collection Time: 08/28/18  7:47 AM  Result Value Ref Range Status   MRSA by PCR NEGATIVE NEGATIVE Final    Comment:        The GeneXpert MRSA Assay (FDA approved for NASAL specimens only), is one component of a comprehensive MRSA colonization surveillance program. It is not intended to diagnose MRSA infection nor to guide or monitor treatment for MRSA infections. Performed at [facility], [place] [address] [place], [place] [address]      Radiology Studies: Ct Head Wo Contrast  Result Date: 08/27/2018 CLINICAL DATA:  Pt reports he was walking to the store when he had a seizure and fell down into a ditch. Pt reports he hit his head on the pavement and takes 27m aspirin every day, hx of seizures EXAM: CT HEAD WITHOUT CONTRAST TECHNIQUE: Contiguous axial images were obtained from the base of the skull through the vertex without intravenous contrast. COMPARISON:  03/18/2016 FINDINGS: Brain: There is mild central and cortical atrophy. There is no intra or extra-axial fluid collection or mass lesion. The basilar cisterns and ventricles have a normal appearance. There is no CT evidence for acute infarction or hemorrhage. There is an old focal lacunar infarct in the RIGHT basal ganglia, stable in appearance. Vascular: There is atherosclerotic calcification of the internal carotid arteries. No hyperdense vessels. Skull: Normal. Negative for fracture or focal lesion. Sinuses/Orbits: No acute finding. Other: None IMPRESSION: 1.  No evidence for acute intracranial abnormality. 2.  Central cortical atrophy. 3. 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 Electronically Signed   By: ENolon NationsM.D.   On: 08/27/2018 18:36   Ct Angio Chest Pe W And/or Wo Contrast  Result Date: 08/27/2018 CLINICAL DATA:  Shortness of breath EXAM: CT ANGIOGRAPHY CHEST WITH CONTRAST TECHNIQUE: Multidetector CT imaging of the chest was performed using the standard protocol during bolus administration of intravenous contrast. Multiplanar CT image reconstructions and MIPs were obtained to evaluate the vascular anatomy. CONTRAST:  65 mL ISOVUE-370 IOPAMIDOL (ISOVUE-370) INJECTION 76% COMPARISON:  04/01/2015 FINDINGS: Cardiovascular: No filling defects in the pulmonary arteries to suggest pulmonary emboli. Heart is normal size. Scattered coronary artery and aortic calcifications. No evidence of aortic aneurysm. Mediastinum/Nodes: Moderate hiatal hernia. No mediastinal, hilar, or axillary adenopathy. Lungs/Pleura: Moderate emphysema. Bilateral calcified nodules compatible with old granulomatous disease. Mucous plugging within the left lower lobe airways. Airspace disease at the left base could reflect atelectasis or early infiltrate. Dependent right base atelectasis. No effusions. Upper Abdomen: Imaging into the upper abdomen shows no acute findings. Musculoskeletal: Chest wall soft tissues are unremarkable. No acute bony abnormality. Review of the MIP images confirms the above findings. IMPRESSION: No evidence of pulmonary embolus. Coronary artery disease. Mucous plugging within the left lower lobe airways with left lower lobe atelectasis or early infiltrate. Right base atelectasis. Old granulomatous disease. Moderate emphysema. Moderate hiatal hernia. Electronically Signed   By: KRolm BaptiseM.D.   On: 08/27/2018 19:16   Dg Chest Portable 1 View  Result Date: 08/27/2018 CLINICAL DATA:  Shortness of breath EXAM: PORTABLE CHEST 1 VIEW COMPARISON:  02/23/2016 FINDINGS: Bilateral  small calcified pulmonary nodules likely reflecting sequela prior granulomatous disease. There is mild left basilar atelectasis. This there is no focal consolidation. There is no pleural effusion or pneumothorax. The heart and mediastinal contours are unremarkable. The osseous structures are unremarkable. IMPRESSION: No acute cardiopulmonary disease. Electronically Signed   By: HKathreen Devoid  On: 08/27/2018 16:53  Scheduled Meds: . arformoterol  15 mcg Nebulization BID  . aspirin EC  81 mg Oral Daily  . budesonide (PULMICORT) nebulizer solution  0.5 mg Nebulization BID  . cholecalciferol  2,000 Units Oral Daily  . clonazepam  0.5 mg Oral BID  . enoxaparin (LOVENOX) injection  40 mg Subcutaneous Daily  . famotidine  20 mg Oral BID  . insulin aspart  0-5 Units Subcutaneous QHS  . insulin aspart  0-9 Units Subcutaneous TID WC  . l-methylfolate-B6-B12  1 tablet Oral Daily  . lactulose  10 g Oral Daily  . latanoprost  1 drop Both Eyes QHS  . levETIRAcetam  1,000 mg Oral BID  . loxapine  25 mg Oral TID  . nicotine  21 mg Transdermal Daily  . OLANZapine  20 mg Oral BID  . polyethylene glycol  17 g Oral BID  . senna  1 tablet Oral BID  . sodium chloride flush  3 mL Intravenous Q12H  . tamsulosin  0.4 mg Oral Daily   Continuous Infusions: . sodium chloride 125 mL/hr at 08/29/18 1100  . ceFEPime (MAXIPIME) IV 1 g (08/29/18 0836)     LOS: 1 day   Marylu Lund, MD [facility] Pager On Amion  If 7PM-7AM, please contact night-coverage 08/29/2018, 12:38 PM

## 2018-08-29 NOTE — Progress Notes (Signed)
PHARMACY - PHYSICIAN COMMUNICATION CRITICAL VALUE ALERT - BLOOD CULTURE IDENTIFICATION (BCID)  Jason Patterson is an 62 y.o. male who presented to [facility] on 08/27/2018 with a chief complaint of found down in ditch.  Assessment:  WBC 13.1 with possble PNA on chest imaging  Name of physician (or Provider) ContactedRhona Leavens  Current antibiotics: cefepime and vanc  Changes to prescribed antibiotics recommended:  Blood culture with GPR in one aerobic likely contaminant. MRSA PCR negative. Consider stopping Vanc and continuing cefepime for coverage of possible pneumonia  No results found for this or any previous visit.  Toniann Fail Chancellor Vanderloop 08/29/2018  9:11 AM

## 2018-08-30 DIAGNOSIS — F3111 Bipolar disorder, current episode manic without psychotic features, mild: Secondary | ICD-10-CM

## 2018-08-30 LAB — GLUCOSE, CAPILLARY
GLUCOSE-CAPILLARY: 60 mg/dL — AB (ref 70–99)
Glucose-Capillary: 77 mg/dL (ref 70–99)
Glucose-Capillary: 82 mg/dL (ref 70–99)

## 2018-08-30 LAB — BASIC METABOLIC PANEL
Anion gap: 3 — ABNORMAL LOW (ref 5–15)
BUN: 8 mg/dL (ref 8–23)
CALCIUM: 7.9 mg/dL — AB (ref 8.9–10.3)
CO2: 22 mmol/L (ref 22–32)
CREATININE: 0.85 mg/dL (ref 0.61–1.24)
Chloride: 116 mmol/L — ABNORMAL HIGH (ref 98–111)
GFR calc Af Amer: >60 mL/min (ref >60)
GFR calc non Af Amer: >60 mL/min (ref >60)
Glucose, Bld: 94 mg/dL (ref 70–99)
Potassium: 4.1 mmol/L (ref 3.5–5.1)
SODIUM: 141 mmol/L (ref 135–145)

## 2018-08-30 LAB — CK: Total CK: 7436 U/L — ABNORMAL HIGH (ref 49–397)

## 2018-08-30 MED ORDER — IBUPROFEN 200 MG PO TABS
400.0000 mg | ORAL_TABLET | Freq: Once | ORAL | Status: AC
  Administered 2018-08-30: 400 mg via ORAL
  Filled 2018-08-30: qty 2

## 2018-08-30 NOTE — Progress Notes (Signed)
Received bedside report earlier. Patient is much more comfortable, having had analgesic.Marland Kitchen He does have a cough, which is irritating to him. Will administer medications as order.Asked patient if he had had a bath today and he said, "no." I asked if he would like one and he stated, "not tonight, I prefer mornings.". Will advise NT of this.

## 2018-08-30 NOTE — Progress Notes (Signed)
PROGRESS NOTE    Jason Patterson  TUU:[id number] DOB: [birth year] DOA: 08/27/2018 PCP: [facility]    Brief Narrative:  62 yo M with a chief complaint of falling down. He stated he was walking and lost his balance and fell into a ditch. The patient is unsure exactly what happened. He has a history of seizures and believed that there is a possibility that he had one. He denied chest pain shortness of breath cough or fever. Patient was found to be tachypneic with a oxygen saturation in the low 80s. Placed on oxygen with improvement. Concern for pneumonia with a lactate level of 8.7. Started on broad-spectrum antibiotics  Assessment & Plan:   Principal Problem:   Rhabdomyolysis Active Problems:   Bipolar disorder ([place])   Essential hypertension   COPD (chronic obstructive pulmonary disease) (HCC)   Schizoaffective disorder, bipolar type (HCC)   SIRS (systemic inflammatory response syndrome) (HCC)   Seizure (HCC)   Hyperglycemia   Hyponatremia   Acute kidney injury superimposed on chronic kidney disease (HCC)   Elevated transaminase level  Suspectedseizure/seizure disorder/fall: Acute. Patient reports having seizures since the age of 89 reports being on Keppra thousand milligrams twice daily. 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 Urine drug screen and alcohol levels were undetectable. Patient spent last 2 years at [facility]. See note dr Weber Cooks dated 10/11. In short patient had no seizure for 2 years at [facility]. He did have frequent mechanical falls and a history of intentionally putting himself on floor. Pt noted to have abrasion on forehead and elevated CK. No report of incontinence - Continued on seizure precautions -Continued on AtivanIV prnseizure activity -On keppra 1gm bid -Physical therapy consulted -No evidence of seizures overnight  SIRS: Patient met SIRS  criteria based off tachycardia, tachypneic, WBC elevated at 12.9, and elevated lactic acid level. WBC trending down today. Lactic acid now within limits of norma. Urinalysis showed no signs of infection. Symptoms could all be related with possible seizure versus pneumonia. CT chest with Mucous plugging within the left lower lobe airways with left lower lobe atelectasis or early infiltrate. No PE -Blood cx with likely contaminant gpc -started on maxapine and vanc. Vanc discontinued -Continued on cefepieme, started on 10/10  Respiratory distress/COPD/?PNA. Resolved. Patient with hx of copd. Not on oxygen at home. Patient did have O2 saturations as low as 89% on room air by EMS. CT chest as noted above. Recently discharged from [facility] with reports of pneumonia treated. Imaging studies show a left lower lobe atelectasis/early pneumonia. Patient was started on empiric antibiotics per above. -Cont to wean O2 as tolerated -continue nebs and home inhalers -flutter valve -Incentive spirometry. -Continue with Brovana and budesonide  Lactic acidosis metabolic acidosis with elevated anion gap: Acute. Initial lactic acid elevated at 8.37 on admission with CO2 13 and anion gap mildly elevated at 16. Suggest symptoms could be secondary to recent seizure versus underlying infection. After initial fluids lactic acidosis resolved. -Serum bicarb up to 22 with IVF hydration -Continued on 125cc/hr  Hyperglycemia: Acute. No hx DM. Resolved today.Initial glucose elevated to 233 on admission. Could be secondary to acute distress. HgA1c 5.1. -Hypoglycemic protocols -CBGs before meals and at bedtime with sensitive SSI  -remain stable at present  Hyponatremia: Sodium level noted to be 129 on admission. Resolved this am.  This can be related to possible medications and/or dehydration. - resolved with IVF hydration -  Repeat bmet in AM  Acute kidney injury versus chronic kidney  disease stage III: Initial creatinine noted to be 1.39 on admission with BUN 16. Last creatinine from 2017 noted to be 1.02.  -continue with strict intake and output -repeat bmet in AM  Rhabdomyolysis -CK trended up from 440 to peak at 10,000 -Continued on IVF to 125cc/hr -repeat CK of 7000. Will continue IVF hydration to prevent renal failure  Schizoaffective disorder -appears stable at baseline -continued on home meds -Appears stable currently  Essential hypertension -Would continue on home medications as tolerated  Legal guardian: - Updated legal guardian, Celene Skeen . - As of 10/12, guardian gives consent for involuntary commitment if needed and states "just do what you need to do for whatever needs to be done." - Guardian at bedside. Questions answered  Tobacco abuse -Nicotine patch ordered. Per staff, pt has been refusing patch -Counsel need of cessation of tobacco -Pt noted to have a pack of cigarettes and lighter   DVT prophylaxis: Lovenox subQ Code Status: Full Family Communication: Patient in room, guardian is at bedside Disposition Plan: Uncertain at this time  Consultants:     Procedures:     Antimicrobials: Anti-infectives (From admission, onward)   Start     Dose/Rate Route Frequency Ordered Stop   08/28/18 0600  vancomycin (VANCOCIN) IVPB 750 mg/150 ml premix  Status:  Discontinued     750 mg 150 mL/hr over 60 Minutes Intravenous Every 12 hours 08/27/18 1837 08/29/18 0924   08/28/18 0100  ceFEPIme (MAXIPIME) 1 g in sodium chloride 0.9 % 100 mL IVPB     1 g 200 mL/hr over 30 Minutes Intravenous Every 8 hours 08/27/18 1837     08/27/18 1715  ceFEPIme (MAXIPIME) 2 g in sodium chloride 0.9 % 100 mL IVPB     2 g 200 mL/hr over 30 Minutes Intravenous  Once 08/27/18 1707 08/27/18 1749   08/27/18 1715  vancomycin (VANCOCIN) 1,500 mg in sodium chloride 0.9 % 500 mL IVPB     1,500 mg 250 mL/hr over 120 Minutes Intravenous  Once 08/27/18 1707  08/27/18 2026      Subjective: Without complaints this AM  Objective: Vitals:   08/30/18 0835 08/30/18 0837 08/30/18 0855 08/30/18 1121  BP:   119/70 118/84  Pulse:  77 79 72  Resp:  '18 16 16  ' Temp:   98.4 F (36.9 C) 98.4 F (36.9 C)  TempSrc:   Oral Oral  SpO2: 95% 95% 95% 93%  Weight:      Height:        Intake/Output Summary (Last 24 hours) at 08/30/2018 1441 Last data filed at 08/30/2018 0544 Gross per 24 hour  Intake 2076.94 ml  Output 5500 ml  Net -3423.06 ml   Filed Weights   08/27/18 1550  Weight: 78 kg    Examination: General exam: Awake, laying in bed, in nad Respiratory system: Normal respiratory effort, no wheezing Cardiovascular system: regular rate, s1, s2 Gastrointestinal system: Soft, nondistended, positive BS Central nervous system: CN2-12 grossly intact, strength intact Extremities: Perfused, no clubbing Skin: Normal skin turgor, no notable skin lesions seen Psychiatry: Mood normal // no visual hallucinations   Data Reviewed: I have personally reviewed following labs and imaging studies  CBC: Recent Labs  Lab 08/27/18 1606 08/28/18 0356 08/29/18 0406  WBC 12.4* 10.8* 8.0  NEUTROABS 11.6*  --   --   HGB 12.9* 13.1 11.2*  HCT 38.4* 39.6 34.0*  MCV 91.6 91.7 92.9  PLT  235 233 435   Basic Metabolic Panel: Recent Labs  Lab 08/27/18 1606 08/28/18 0356 08/29/18 0406 08/30/18 0514  NA 129* 142 138 141  K 3.9 3.5 3.8 4.1  CL 100 109 112* 116*  CO2 13* 22 20* 22  GLUCOSE 233* 119* 117* 94  BUN '16 12 11 8  ' CREATININE 1.39* 1.10 1.01 0.85  CALCIUM 8.7* 8.2* 8.0* 7.9*  MG  --  2.4  --   --    GFR: Estimated Creatinine Clearance: 83.9 mL/min (by C-G formula based on SCr of 0.85 mg/dL). Liver Function Tests: Recent Labs  Lab 08/28/18 0356 08/29/18 0406  AST 394* 282*  ALT 95* 105*  ALKPHOS 64 53  BILITOT 0.8 0.6  PROT 5.2* 4.8*  ALBUMIN 3.1* 2.7*   No results for input(s): LIPASE, AMYLASE in the last 168 hours. No results  for input(s): AMMONIA in the last 168 hours. Coagulation Profile: No results for input(s): INR, PROTIME in the last 168 hours. Cardiac Enzymes: Recent Labs  Lab 08/27/18 1606 08/28/18 0356 08/29/18 0406 08/30/18 0514  CKTOTAL 440* 5,771* 10,192* 7,436*   BNP (last 3 results) No results for input(s): PROBNP in the last 8760 hours. HbA1C: Recent Labs    08/28/18 0356  HGBA1C 5.1   CBG: Recent Labs  Lab 08/29/18 1643 08/29/18 1645 08/29/18 2128 08/30/18 0625 08/30/18 1125  GLUCAP 64* 71 90 77 82   Lipid Profile: No results for input(s): CHOL, HDL, LDLCALC, TRIG, CHOLHDL, LDLDIRECT in the last 72 hours. Thyroid Function Tests: Recent Labs    08/28/18 0356  TSH 0.454   Anemia Panel: No results for input(s): VITAMINB12, FOLATE, FERRITIN, TIBC, IRON, RETICCTPCT in the last 72 hours. Sepsis Labs: Recent Labs  Lab 08/27/18 1627 08/27/18 2030  LATICACIDVEN 8.37* 1.49    Recent Results (from the past 240 hour(s))  Blood culture (routine x 2)     Status: None (Preliminary result)   Collection Time: 08/27/18  5:04 PM  Result Value Ref Range Status   Specimen Description BLOOD LEFT WRIST  Final   Special Requests   Final    BOTTLES DRAWN AEROBIC AND ANAEROBIC Blood Culture adequate volume   Culture  Setup Time   Final    GRAM POSITIVE RODS AEROBIC BOTTLE ONLY CRITICAL RESULT CALLED TO, READ BACK BY AND VERIFIED WITH: PHARMD T RUDISILL [id number] Performed at [facility], [place] [address], [place], [place] [address]    Culture GRAM POSITIVE RODS  Final   Report Status PENDING  Incomplete  Blood culture (routine x 2)     Status: None (Preliminary result)   Collection Time: 08/27/18  5:15 PM  Result Value Ref Range Status   Specimen Description BLOOD RIGHT HAND  Final   Special Requests   Final    BOTTLES DRAWN AEROBIC AND ANAEROBIC Blood Culture adequate volume   Culture   Final    NO GROWTH 3 DAYS Performed at [facility], [address],  [place], [place] [address]    Report Status PENDING  Incomplete  Urine culture     Status: Abnormal   Collection Time: 08/27/18  6:46 PM  Result Value Ref Range Status   Specimen Description URINE, RANDOM  Final   Special Requests   Final    NONE Performed at [facility], [place] [address], [place], [place] [address]    Culture MULTIPLE SPECIES PRESENT, SUGGEST RECOLLECTION (A)  Final   Report Status 08/28/2018 FINAL  Final  MRSA PCR Screening     Status: None   Collection Time: 08/28/18  7:47 AM  Result Value Ref Range Status   MRSA by PCR NEGATIVE NEGATIVE Final    Comment:        The GeneXpert MRSA Assay (FDA approved for NASAL specimens only), is one component of a comprehensive MRSA colonization surveillance program. It is not intended to diagnose MRSA infection nor to guide or monitor treatment for MRSA infections. Performed at [facility], [place] [address] [place], [place] [address]      Radiology Studies: No results found.  Scheduled Meds: . arformoterol  15 mcg Nebulization BID  . aspirin EC  81 mg Oral Daily  . budesonide (PULMICORT) nebulizer solution  0.5 mg Nebulization BID  . cholecalciferol  2,000 Units Oral Daily  . clonazepam  0.5 mg Oral BID  . enoxaparin (LOVENOX) injection  40 mg Subcutaneous Daily  . famotidine  20 mg Oral BID  . insulin aspart  0-5 Units Subcutaneous QHS  . insulin aspart  0-9 Units Subcutaneous TID WC  . l-methylfolate-B6-B12  1 tablet Oral Daily  . lactulose  10 g Oral Daily  . latanoprost  1 drop Both Eyes QHS  . levETIRAcetam  1,000 mg Oral BID  . loxapine  25 mg Oral TID  . nicotine  21 mg Transdermal Daily  . OLANZapine  20 mg Oral BID  . polyethylene glycol  17 g Oral BID  . senna  1 tablet Oral BID  . sodium chloride flush  3 mL Intravenous Q12H  . tamsulosin  0.4 mg Oral Daily   Continuous Infusions: . sodium chloride 125 mL/hr at 08/30/18 1005  . ceFEPime (MAXIPIME) IV 1 g (08/30/18 1002)     LOS: 2 days    Marylu Lund, MD [facility] Pager On Amion  If 7PM-7AM, please contact night-coverage 08/30/2018, 2:41 PM

## 2018-08-30 NOTE — Progress Notes (Signed)
Patient states he has generalized pain, but does not want tylenol. States  He takes Ibuprofen at home. Paged MD with request. Bed alarm on. Safety maintained.

## 2018-08-31 LAB — BASIC METABOLIC PANEL
ANION GAP: 7 (ref 5–15)
BUN: 7 mg/dL — ABNORMAL LOW (ref 8–23)
CALCIUM: 8 mg/dL — AB (ref 8.9–10.3)
CHLORIDE: 110 mmol/L (ref 98–111)
CO2: 23 mmol/L (ref 22–32)
Creatinine, Ser: 0.84 mg/dL (ref 0.61–1.24)
GFR calc Af Amer: >60 mL/min (ref >60)
GFR calc non Af Amer: >60 mL/min (ref >60)
GLUCOSE: 150 mg/dL — AB (ref 70–99)
Potassium: 3.9 mmol/L (ref 3.5–5.1)
SODIUM: 140 mmol/L (ref 135–145)

## 2018-08-31 LAB — GLUCOSE, CAPILLARY
GLUCOSE-CAPILLARY: 96 mg/dL (ref 70–99)
Glucose-Capillary: 110 mg/dL — ABNORMAL HIGH (ref 70–99)
Glucose-Capillary: 94 mg/dL (ref 70–99)

## 2018-08-31 LAB — CK: Total CK: 4618 U/L — ABNORMAL HIGH (ref 49–397)

## 2018-08-31 MED ORDER — CEFDINIR 300 MG PO CAPS: 300.0000 mg | ORAL_CAPSULE | Freq: Two times a day (BID) | ORAL | 0 refills | Status: AC

## 2018-08-31 NOTE — Discharge Summary (Signed)
Physician Discharge Summary  NORVIL MARTENSEN ZMO:[id number] DOB: 1955/12/07 DOA: 08/27/2018  PCP: [facility] date: 08/27/2018 Discharge date: 08/31/2018  Admitted From: [facility] Disposition:  [place]  Recommendations for Outpatient Follow-up:  1. Follow up with PCP in 1-2 weeks 2. Continue to have patient abstain from smoking 3. Recommend repeat BMET in one week 4. No driving for at least 6 months or if cleared by PCP  Discharge Condition:Improved CODE STATUS:Full Diet recommendation: Regular   Brief/Interim Summary: 62 yo M with a chief complaint of falling down. He statedhe was walking and lost his balance and fell into a ditch. The patient is unsure exactly what happened. Hehas ahistory of seizures and believedthat there is a possibility that he had one.He deniedchest pain shortness of breath cough or fever. Patient was found to be tachypneic with a oxygen saturation in the low 80s. Placed on oxygen with improvement. Concern for pneumonia with a lactate level of 8.7. Started on broad-spectrum antibiotics  Suspectedseizure/seizure disorder/fall: Acute. Patient reports having seizures since the age of 62 reports being on Keppra thousand milligrams twice daily. 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 Urine drug screen and alcohol levels were undetectable.Patient spent last 2 years at [facility]. See note dr Weber Cooks dated 10/11. In short patient had no seizure for 2 years at 62 [place]. He did have frequent mechanical falls and a history of intentionally putting himself on floor. Pt noted to have abrasion on forehead and elevated CK. No report of incontinence -Continued on seizure precautions -Continued on AtivanIV prnseizure activity -On keppra 1gm bid -No evidence of seizures this course  SIRS:Patient met SIRS criteria based off  tachycardia, tachypneic, WBC elevated at 12.9, and elevated lactic acid level.WBC trending down today. Lactic acid now within limits of norma.Urinalysis showed no signs of infection. Symptoms could all be related with possible seizure versus pneumonia. CT chest withMucous plugging within the left lower lobe airways with left lower lobe atelectasis or early infiltrate. No PE -Blood cx with likely contaminant gpc -started on maxapine and vanc. Vanc discontinued -Continued on cefepieme, started on 10/10. Will give two more doses of omnicef on discharge. Patient tolerated cefepime without issues  Respiratory distress/COPD/?PNA. Resolved. Patient with hx of copd. Not on oxygen at home.Patient did have O2 saturations as low as 89% on room air by EMS.CT chest as noted above. [facility] from [facility] with reports of pneumonia treated. Imaging studies show a left lower lobe atelectasis/early pneumonia. Patient was started on empiric antibiotics per above. -Cont to wean O2 as tolerated -continue nebs and home inhalers -flutter valve -Incentive spirometry. -Continued with Brovana and budesonide  Lactic acidosis metabolic acidosis with elevated anion gap: Acute. Initial lactic acid elevated at 8.37 on admission with CO2 13 and anion gap mildly elevated at 16. Suggest symptoms could be secondary to recent seizure versus underlying infection. After initial fluids lactic acidosis resolved. -Serum bicarb up to 22 with IVF hydration -Improved with 125cc/hr saline  Hyperglycemia: Acute.No hx DM. Resolved today.Initial glucose elevated to 233 on admission. Could be secondary to acute distress.HgA1c 5.1. -Hypoglycemic protocols -CBGs before meals and at bedtime with sensitive SSI  -remain stable at present  Hyponatremia: Sodium level noted to be 129 on admission.Resolved this am.This can be related to possible medications and/or dehydration. - resolved  with IVF hydration  Acute kidney injury versus chronic kidney disease stage III: Initial  creatinine noted to be 1.39 on admission with BUN 16. Last creatinine from 2017 noted to be 1.02.  -continue with strict intake and output -resolved  Rhabdomyolysis -CK trended up from 440 to peak at 10,000 -Continued on IVF to 125cc/hr -CK down to less than 5k. Continue to encourage PO intake   Schizoaffective disorder -appears stable at baseline -continued on home meds -Appears stable currently  Essential hypertension -Would continue on home medications as tolerated  Legal guardian: - Updated legal guardian, Celene Skeen .  Tobacco abuse -Nicotine patch ordered. Per staff, pt has been refusing patch -Counsel need of cessation of tobacco -Pt noted to have a pack of cigarettes and lighter this visit in room    Discharge Diagnoses:  Principal Problem:   Rhabdomyolysis Active Problems:   Bipolar disorder ([place])   Essential hypertension   COPD (chronic obstructive pulmonary disease) ([place])   Schizoaffective disorder, bipolar type ([place])   SIRS (systemic inflammatory response syndrome) ([place])   Seizure ([place])   Hyperglycemia   Hyponatremia   Acute kidney injury superimposed on chronic kidney disease ([place])   Elevated transaminase level    Discharge Instructions   Allergies as of 08/31/2018      Reactions   Paxil [paroxetine Hcl] Other (See Comments)   Told by MD to discontinue use   Penicillins Other (See Comments)   Family history of allergies; patient's sister took once and died as a result (FYI).  Amoxicillin is ok   Tramadol Other (See Comments)   Seizure disorder.  Caused seizure.   Depakote [divalproex Sodium] Hives, Swelling   Acyclovir And Related       Medication List    STOP taking these medications   budesonide-formoterol 160-4.5 MCG/ACT inhaler Commonly known as:  SYMBICORT   cloNIDine 0.1 MG tablet Commonly known as:  CATAPRES   gabapentin  600 MG tablet Commonly known as:  NEURONTIN   pantoprazole 40 MG tablet Commonly known as:  PROTONIX   potassium chloride 10 MEQ tablet Commonly known as:  K-DUR   tiotropium 18 MCG inhalation capsule Commonly known as:  SPIRIVA     TAKE these medications   aspirin EC 81 MG tablet Take 81 mg by mouth daily.   b complex vitamins capsule Take 1 capsule by mouth daily.   cefdinir 300 MG capsule Commonly known as:  OMNICEF Take 1 capsule (300 mg total) by mouth 2 (two) times daily for 2 days.   clonazePAM 0.5 MG tablet Commonly known as:  KLONOPIN Take 1 tablet (0.5 mg total) by mouth 2 (two) times daily.   l-methylfolate-B6-B12 3-35-2 MG Tabs tablet Commonly known as:  METANX Take 1 tablet by mouth daily.   latanoprost 0.005 % ophthalmic solution Commonly known as:  XALATAN Place 1 drop into both eyes at bedtime.   levETIRAcetam 500 MG tablet Commonly known as:  KEPPRA Take 1,000 mg by mouth 2 (two) times daily.   loxapine 25 MG capsule Commonly known as:  LOXITANE Take 25 mg by mouth 3 (three) times daily.   NONFORMULARY OR COMPOUNDED ITEM Apply 1 application topically at bedtime. Triamcinolone cr 0.1% w/  Moisturizing cream Mix 1:1  And apply to dry irritated areas of face and legs   OLANZapine 20 MG tablet Commonly known as:  ZYPREXA Take 20 mg by mouth 2 (two) times daily. What changed:  Another medication with the same name was removed. Continue taking this medication, and follow the directions you see here.   polyethylene glycol packet Commonly known  as:  MIRALAX / GLYCOLAX Take 17 g by mouth 2 (two) times daily.   PROAIR HFA 108 (90 Base) MCG/ACT inhaler Generic drug:  albuterol Inhale 1 puff into the lungs 4 (four) times daily.   ranitidine 150 MG tablet Commonly known as:  ZANTAC Take 150 mg by mouth 2 (two) times daily.   senna 8.6 MG Tabs tablet Commonly known as:  SENOKOT Take 1 tablet by mouth 2 (two) times daily.   tamsulosin 0.4 MG Caps  capsule Commonly known as:  FLOMAX Take 0.4 mg by mouth daily.   Vitamin D (Ergocalciferol) 2000 units Caps Take 2,000 Units by mouth daily.       Allergies  Allergen Reactions  . Paxil [Paroxetine Hcl] Other (See Comments)    Told by MD to discontinue use  . Penicillins Other (See Comments)    Family history of allergies; patient's sister took once and died as a result (FYI).  Amoxicillin is ok  . Tramadol Other (See Comments)    Seizure disorder.  Caused seizure.  . Depakote [Divalproex Sodium] Hives and Swelling  . Acyclovir And Related     Procedures/Studies: Ct Head Wo Contrast  Result Date: 08/27/2018 CLINICAL DATA:  Pt reports he was walking to the store when he had a seizure and fell down into a ditch. Pt reports he hit his head on the pavement and takes 46m aspirin every day, hx of seizures EXAM: CT HEAD WITHOUT CONTRAST TECHNIQUE: Contiguous axial images were obtained from the base of the skull through the vertex without intravenous contrast. COMPARISON:  03/18/2016 FINDINGS: Brain: There is mild central and cortical atrophy. There is no intra or extra-axial fluid collection or mass lesion. The basilar cisterns and ventricles have a normal appearance. There is no CT evidence for acute infarction or hemorrhage. There is an old focal lacunar infarct in the RIGHT basal ganglia, stable in appearance. Vascular: There is atherosclerotic calcification of the internal carotid arteries. No hyperdense vessels. Skull: Normal. Negative for fracture or focal lesion. Sinuses/Orbits: No acute finding. Other: None IMPRESSION: 1.  No evidence for acute intracranial abnormality. 2. Central cortical atrophy. 3. 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 Electronically Signed   By: ENolon NationsM.D.   On: 08/27/2018 18:36   Ct Angio Chest Pe W And/or Wo Contrast  Result Date: 08/27/2018 CLINICAL DATA:  Shortness of breath EXAM: CT ANGIOGRAPHY CHEST WITH CONTRAST  TECHNIQUE: Multidetector CT imaging of the chest was performed using the standard protocol during bolus administration of intravenous contrast. Multiplanar CT image reconstructions and MIPs were obtained to evaluate the vascular anatomy. CONTRAST:  65 mL ISOVUE-370 IOPAMIDOL (ISOVUE-370) INJECTION 76% COMPARISON:  04/01/2015 FINDINGS: Cardiovascular: No filling defects in the pulmonary arteries to suggest pulmonary emboli. Heart is normal size. Scattered coronary artery and aortic calcifications. No evidence of aortic aneurysm. Mediastinum/Nodes: Moderate hiatal hernia. No mediastinal, hilar, or axillary adenopathy. Lungs/Pleura: Moderate emphysema. Bilateral calcified nodules compatible with old granulomatous disease. Mucous plugging within the left lower lobe airways. Airspace disease at the left base could reflect atelectasis or early infiltrate. Dependent right base atelectasis. No effusions. Upper Abdomen: Imaging into the upper abdomen shows no acute findings. Musculoskeletal: Chest wall soft tissues are unremarkable. No acute bony abnormality. Review of the MIP images confirms the above findings. IMPRESSION: No evidence of pulmonary embolus. Coronary artery disease. Mucous plugging within the left lower lobe airways with left lower lobe atelectasis or early infiltrate. Right base atelectasis. Old granulomatous disease. Moderate  emphysema. Moderate hiatal hernia. Electronically Signed   By: Rolm Baptise M.D.   On: 08/27/2018 19:16   Dg Chest Portable 1 View  Result Date: 08/27/2018 CLINICAL DATA:  Shortness of breath EXAM: PORTABLE CHEST 1 VIEW COMPARISON:  02/23/2016 FINDINGS: Bilateral small calcified pulmonary nodules likely reflecting sequela prior granulomatous disease. There is mild left basilar atelectasis. This there is no focal consolidation. There is no pleural effusion or pneumothorax. The heart and mediastinal contours are unremarkable. The osseous structures are unremarkable. IMPRESSION: No  acute cardiopulmonary disease. Electronically Signed   By: Kathreen Devoid   On: 08/27/2018 16:53     Subjective: Eager to be discharged  Discharge Exam: Vitals:   08/31/18 0858 08/31/18 1207  BP:  130/82  Pulse:  83  Resp:    Temp:  98.8 F (37.1 C)  SpO2: 100% 97%   Vitals:   08/31/18 0737 08/31/18 0851 08/31/18 0858 08/31/18 1207  BP: 119/74   130/82  Pulse: 92 76  83  Resp: 18 20    Temp: 98.8 F (37.1 C)   98.8 F (37.1 C)  TempSrc: Oral   Oral  SpO2: 92% 100% 100% 97%  Weight:      Height:        General: Pt is alert, awake, not in acute distress Cardiovascular: RRR, S1/S2 +, no rubs, no gallops Respiratory: CTA bilaterally, no wheezing, no rhonchi Abdominal: Soft, NT, ND, bowel sounds + Extremities: no edema, no cyanosis   The results of significant diagnostics from this hospitalization (including imaging, microbiology, ancillary and laboratory) are listed below for reference.     Microbiology: Recent Results (from the past 240 hour(s))  Blood culture (routine x 2)     Status: None (Preliminary result)   Collection Time: 08/27/18  5:04 PM  Result Value Ref Range Status   Specimen Description BLOOD LEFT WRIST  Final   Special Requests   Final    BOTTLES DRAWN AEROBIC AND ANAEROBIC Blood Culture adequate volume   Culture  Setup Time   Final    GRAM POSITIVE RODS AEROBIC BOTTLE ONLY CRITICAL RESULT CALLED TO, READ BACK BY AND VERIFIED WITH: PHARMD T RUDISILL [id number] MLM Performed at [facility], [place] [address], [place], [place] [address]    Culture GRAM POSITIVE RODS  Final   Report Status PENDING  Incomplete  Blood culture (routine x 2)     Status: None (Preliminary result)   Collection Time: 08/27/18  5:15 PM  Result Value Ref Range Status   Specimen Description BLOOD RIGHT HAND  Final   Special Requests   Final    BOTTLES DRAWN AEROBIC AND ANAEROBIC Blood Culture adequate volume   Culture   Final    NO GROWTH 3 DAYS Performed at [facility], [address] [place], [place] [address]    Report Status PENDING  Incomplete  Urine culture     Status: Abnormal   Collection Time: 08/27/18  6:46 PM  Result Value Ref Range Status   Specimen Description URINE, RANDOM  Final   Special Requests   Final    NONE Performed at [facility], [place] [address] [place], [place] [address]    Culture MULTIPLE SPECIES PRESENT, SUGGEST RECOLLECTION (A)  Final   Report Status 08/28/2018 FINAL  Final  MRSA PCR Screening     Status: None   Collection Time: 08/28/18  7:47 AM  Result Value Ref Range Status   MRSA by PCR  NEGATIVE NEGATIVE Final    Comment:        The GeneXpert MRSA Assay (FDA approved for NASAL specimens only), is one component of a comprehensive MRSA colonization surveillance program. It is not intended to diagnose MRSA infection nor to guide or monitor treatment for MRSA infections. Performed at [facility], [place] [address], [place], [place] [address]      Labs: BNP (last 3 results) No results for input(s): BNP in the last 8760 hours. Basic Metabolic Panel: Recent Labs  Lab 08/27/18 1606 08/28/18 0356 08/29/18 0406 08/30/18 0514 08/31/18 0802  NA 129* 142 138 141 140  K 3.9 3.5 3.8 4.1 3.9  CL 100 109 112* 116* 110  CO2 13* 22 20* 22 23  GLUCOSE 233* 119* 117* 94 150*  BUN '16 12 11 8 ' 7*  CREATININE 1.39* 1.10 1.01 0.85 0.84  CALCIUM 8.7* 8.2* 8.0* 7.9* 8.0*  MG  --  2.4  --   --   --    Liver Function Tests: Recent Labs  Lab 08/28/18 0356 08/29/18 0406  AST 394* 282*  ALT 95* 105*  ALKPHOS 64 53  BILITOT 0.8 0.6  PROT 5.2* 4.8*  ALBUMIN 3.1* 2.7*   No results for input(s): LIPASE, AMYLASE in the last 168 hours. No results for input(s): AMMONIA in the last 168 hours. CBC: Recent Labs  Lab 08/27/18 1606 08/28/18 0356 08/29/18 0406  WBC 12.4* 10.8* 8.0  NEUTROABS 11.6*  --   --   HGB 12.9* 13.1 11.2*  HCT 38.4* 39.6 34.0*  MCV 91.6 91.7 92.9  PLT 235 233 192    Cardiac Enzymes: Recent Labs  Lab 08/27/18 1606 08/28/18 0356 08/29/18 0406 08/30/18 0514 08/31/18 0802  CKTOTAL 440* 5,771* 10,192* 7,436* 4,618*   BNP: Invalid input(s): POCBNP CBG: Recent Labs  Lab 08/30/18 1125 08/30/18 1718 08/30/18 2103 08/31/18 0625 08/31/18 1058  GLUCAP 82 60* 110* 94 96   D-Dimer No results for input(s): DDIMER in the last 72 hours. Hgb A1c No results for input(s): HGBA1C in the last 72 hours. Lipid Profile No results for input(s): CHOL, HDL, LDLCALC, TRIG, CHOLHDL, LDLDIRECT in the last 72 hours. Thyroid function studies No results for input(s): TSH, T4TOTAL, T3FREE, THYROIDAB in the last 72 hours.  Invalid input(s): FREET3 Anemia work up No results for input(s): VITAMINB12, FOLATE, FERRITIN, TIBC, IRON, RETICCTPCT in the last 72 hours. Urinalysis    Component Value Date/Time   COLORURINE YELLOW 08/27/2018 1700   APPEARANCEUR CLOUDY (A) 08/27/2018 1700   APPEARANCEUR Clear 04/28/2012 1940   LABSPEC 1.008 08/27/2018 1700   LABSPEC 1.006 04/28/2012 1940   PHURINE 6.0 08/27/2018 1700   GLUCOSEU 50 (A) 08/27/2018 1700   GLUCOSEU Negative 04/28/2012 1940   HGBUR LARGE (A) 08/27/2018 1700   BILIRUBINUR NEGATIVE 08/27/2018 1700   BILIRUBINUR Negative 04/28/2012 1940   KETONESUR NEGATIVE 08/27/2018 1700   PROTEINUR NEGATIVE 08/27/2018 1700   UROBILINOGEN 0.2 05/31/2015 0220   NITRITE NEGATIVE 08/27/2018 1700   LEUKOCYTESUR NEGATIVE 08/27/2018 1700   LEUKOCYTESUR Negative 04/28/2012 1940   Sepsis Labs Invalid input(s): PROCALCITONIN,  WBC,  LACTICIDVEN Microbiology Recent Results (from the past 240 hour(s))  Blood culture (routine x 2)     Status: None (Preliminary result)   Collection Time: 08/27/18  5:04 PM  Result Value Ref Range Status   Specimen Description BLOOD LEFT WRIST  Final   Special Requests   Final    BOTTLES DRAWN AEROBIC AND ANAEROBIC Blood Culture adequate volume  Culture  Setup Time   Final    GRAM POSITIVE  RODS AEROBIC BOTTLE ONLY CRITICAL RESULT CALLED TO, READ BACK BY AND VERIFIED WITH: PHARMD T RUDISILL [id number] MLM Performed at [facility], [place] [address] [place], [place] [address]    Culture GRAM POSITIVE RODS  Final   Report Status PENDING  Incomplete  Blood culture (routine x 2)     Status: None (Preliminary result)   Collection Time: 08/27/18  5:15 PM  Result Value Ref Range Status   Specimen Description BLOOD RIGHT HAND  Final   Special Requests   Final    BOTTLES DRAWN AEROBIC AND ANAEROBIC Blood Culture adequate volume   Culture   Final    NO GROWTH 3 DAYS Performed at [facility], [address] [place], [place] [address]    Report Status PENDING  Incomplete  Urine culture     Status: Abnormal   Collection Time: 08/27/18  6:46 PM  Result Value Ref Range Status   Specimen Description URINE, RANDOM  Final   Special Requests   Final    NONE Performed at [facility], [place] [address], [place], [place] [address]    Culture MULTIPLE SPECIES PRESENT, SUGGEST RECOLLECTION (A)  Final   Report Status 08/28/2018 FINAL  Final  MRSA PCR Screening     Status: None   Collection Time: 08/28/18  7:47 AM  Result Value Ref Range Status   MRSA by PCR NEGATIVE NEGATIVE Final    Comment:        The GeneXpert MRSA Assay (FDA approved for NASAL specimens only), is one component of a comprehensive MRSA colonization surveillance program. It is not intended to diagnose MRSA infection nor to guide or monitor treatment for MRSA infections. Performed at [facility], [place] [address] [place], [place] [address]    Time spent: 30 min  SIGNED:   Marylu Lund, MD  [facility] 08/31/2018, 1:35 PM  If 7PM-7AM, please contact night-coverage

## 2018-08-31 NOTE — Care Management Note (Signed)
Case Management Note  Patient Details  Name: JONES VIVIANI MRN: [id number] Date of Birth: [birth year]  Subjective/Objective:     Patient admitted with rhabdomyolysis. He is from group home. Pt has PCP, but no insurance. Group home takes care of his medication needs.                Action/Plan: Pt returning to group home today. Transport arranged by CSW. CM signing off.   Expected Discharge Date:  08/31/18               Expected Discharge Plan:  Group Home  In-House Referral:  Clinical Social Work  Discharge planning Services     Post Acute Care Choice:    Choice offered to:     DME Arranged:    DME Agency:     HH Arranged:    HH Agency:     Status of Service:  Completed, signed off  If discussed at [employer], dates discussed:    Additional Comments:  Kermit Balo, RN 08/31/2018, 2:10 PM

## 2018-08-31 NOTE — Discharge Instructions (Signed)
°  No driving for at least 6 months or if cleared by PCP

## 2018-08-31 NOTE — Progress Notes (Addendum)
UPDATE 12:44 PM:  CSW contacted by patient's legal guardian, [name], that patient is not allowed to have his lighter back or the vapor system that were taken away from him over the weekend. Per legal guardian, they are safety hazards and given his history, it is not safe for him to have them at this time. CSW updated RN that patient is not able to receive those items back. Legal guardian will come to the unit to pick them up when able, they need to be held at the nursing station.  Blenda Nicely, LCSW Clinical Social Worker [phone]    CSW alerted by MD that patient stable for discharge today. CSW alerted patient's care home facilitator, Corrie Dandy, and transport has been set up for 3:15 PM. CSW contacted patient's legal guardian and left voicemail that patient will be returning today.  Report #: [phone]  CSW signing off.  Blenda Nicely, [place] Clinical Social Worker [phone]

## 2018-09-01 LAB — CULTURE, BLOOD (ROUTINE X 2)
Culture: NO GROWTH
Special Requests: ADEQUATE

## 2018-09-02 ENCOUNTER — Telehealth: Payer: Self-pay

## 2018-09-02 LAB — CULTURE, BLOOD (ROUTINE X 2): SPECIAL REQUESTS: ADEQUATE

## 2018-09-13 ENCOUNTER — Other Ambulatory Visit: Payer: Self-pay | Admitting: Psychiatry

## 2018-09-14 ENCOUNTER — Telehealth: Payer: Self-pay

## 2018-09-21 ENCOUNTER — Telehealth: Payer: Self-pay

## 2018-09-24 ENCOUNTER — Other Ambulatory Visit: Payer: Self-pay | Admitting: Psychiatry

## 2018-09-27 ENCOUNTER — Other Ambulatory Visit: Payer: Self-pay | Admitting: Psychiatry

## 2018-09-28 ENCOUNTER — Telehealth: Payer: Self-pay

## 2018-10-06 ENCOUNTER — Emergency Department
Admission: EM | Admit: 2018-10-06 | Discharge: 2018-10-07 | Disposition: A | Discharge status: Other Acute Inpatient Hospital | Payer: Medicaid Other | Attending: Student in an Organized Health Care Education/Training Program | Admitting: Student in an Organized Health Care Education/Training Program

## 2018-10-06 ENCOUNTER — Encounter: Payer: Self-pay | Admitting: Emergency Medicine

## 2018-10-06 ENCOUNTER — Other Ambulatory Visit: Payer: Self-pay

## 2018-10-06 DIAGNOSIS — F250 Schizoaffective disorder, bipolar type: Secondary | ICD-10-CM | POA: Diagnosis present

## 2018-10-06 DIAGNOSIS — I129 Hypertensive chronic kidney disease with stage 1 through stage 4 chronic kidney disease, or unspecified chronic kidney disease: Secondary | ICD-10-CM | POA: Diagnosis not present

## 2018-10-06 DIAGNOSIS — Z79899 Other long term (current) drug therapy: Secondary | ICD-10-CM | POA: Insufficient documentation

## 2018-10-06 DIAGNOSIS — F209 Schizophrenia, unspecified: Secondary | ICD-10-CM | POA: Insufficient documentation

## 2018-10-06 DIAGNOSIS — F319 Bipolar disorder, unspecified: Secondary | ICD-10-CM | POA: Diagnosis not present

## 2018-10-06 DIAGNOSIS — R45851 Suicidal ideations: Secondary | ICD-10-CM | POA: Insufficient documentation

## 2018-10-06 DIAGNOSIS — R569 Unspecified convulsions: Secondary | ICD-10-CM

## 2018-10-06 DIAGNOSIS — F99 Mental disorder, not otherwise specified: Secondary | ICD-10-CM | POA: Diagnosis present

## 2018-10-06 DIAGNOSIS — F17210 Nicotine dependence, cigarettes, uncomplicated: Secondary | ICD-10-CM | POA: Insufficient documentation

## 2018-10-06 DIAGNOSIS — J449 Chronic obstructive pulmonary disease, unspecified: Secondary | ICD-10-CM | POA: Insufficient documentation

## 2018-10-06 DIAGNOSIS — F419 Anxiety disorder, unspecified: Secondary | ICD-10-CM | POA: Diagnosis not present

## 2018-10-06 DIAGNOSIS — N189 Chronic kidney disease, unspecified: Secondary | ICD-10-CM | POA: Diagnosis not present

## 2018-10-06 DIAGNOSIS — K219 Gastro-esophageal reflux disease without esophagitis: Secondary | ICD-10-CM | POA: Diagnosis present

## 2018-10-06 DIAGNOSIS — Z7982 Long term (current) use of aspirin: Secondary | ICD-10-CM | POA: Diagnosis not present

## 2018-10-06 LAB — COMPREHENSIVE METABOLIC PANEL
ALT: 13 U/L (ref 0–44)
AST: 18 U/L (ref 15–41)
Albumin: 4.1 g/dL (ref 3.5–5.0)
Alkaline Phosphatase: 76 U/L (ref 38–126)
Anion gap: 9 (ref 5–15)
BUN: 10 mg/dL (ref 8–23)
CHLORIDE: 110 mmol/L (ref 98–111)
CO2: 22 mmol/L (ref 22–32)
CREATININE: 0.89 mg/dL (ref 0.61–1.24)
Calcium: 9.1 mg/dL (ref 8.9–10.3)
Glucose, Bld: 97 mg/dL (ref 70–99)
POTASSIUM: 3.5 mmol/L (ref 3.5–5.1)
Sodium: 141 mmol/L (ref 135–145)
Total Bilirubin: 0.6 mg/dL (ref 0.3–1.2)
Total Protein: 6.8 g/dL (ref 6.5–8.1)

## 2018-10-06 LAB — ACETAMINOPHEN LEVEL: Acetaminophen (Tylenol), Serum: <10 ug/mL — ABNORMAL LOW (ref 10–30)

## 2018-10-06 LAB — CBC
HCT: 41.3 % (ref 39.0–52.0)
Hemoglobin: 14.1 g/dL (ref 13.0–17.0)
MCH: 30.3 pg (ref 26.0–34.0)
MCHC: 34.1 g/dL (ref 30.0–36.0)
MCV: 88.6 fL (ref 80.0–100.0)
NRBC: 0 % (ref 0.0–0.2)
PLATELETS: 276 10*3/uL (ref 150–400)
RBC: 4.66 MIL/uL (ref 4.22–5.81)
RDW: 14 % (ref 11.5–15.5)
WBC: 8.2 10*3/uL (ref 4.0–10.5)

## 2018-10-06 LAB — SALICYLATE LEVEL

## 2018-10-06 LAB — ETHANOL

## 2018-10-06 MED ORDER — ALBUTEROL SULFATE HFA 108 (90 BASE) MCG/ACT IN AERS
1.0000 | INHALATION_SPRAY | Freq: Four times a day (QID) | RESPIRATORY_TRACT | Status: DC
  Administered 2018-10-06 – 2018-10-07 (×3): 1 via RESPIRATORY_TRACT
  Filled 2018-10-06: qty 6.7

## 2018-10-06 MED ORDER — OLANZAPINE 10 MG PO TABS
20.0000 mg | ORAL_TABLET | Freq: Two times a day (BID) | ORAL | Status: DC
  Administered 2018-10-06 – 2018-10-07 (×2): 20 mg via ORAL
  Filled 2018-10-06 (×2): qty 2

## 2018-10-06 MED ORDER — ASPIRIN EC 81 MG PO TBEC
81.0000 mg | DELAYED_RELEASE_TABLET | Freq: Every day | ORAL | Status: DC
  Administered 2018-10-06 – 2018-10-07 (×2): 81 mg via ORAL
  Filled 2018-10-06 (×2): qty 1

## 2018-10-06 MED ORDER — LEVETIRACETAM 500 MG PO TABS
1000.0000 mg | ORAL_TABLET | Freq: Two times a day (BID) | ORAL | Status: DC
  Administered 2018-10-06 – 2018-10-07 (×2): 1000 mg via ORAL
  Filled 2018-10-06 (×3): qty 2

## 2018-10-06 MED ORDER — LOXAPINE SUCCINATE 5 MG PO CAPS
25.0000 mg | ORAL_CAPSULE | Freq: Three times a day (TID) | ORAL | Status: DC
  Administered 2018-10-06 – 2018-10-07 (×2): 25 mg via ORAL
  Filled 2018-10-06 (×4): qty 1

## 2018-10-06 NOTE — ED Triage Notes (Addendum)
Pt presents to ED via [place] [employer] from [facility] after pt has been expressing suicidal ideation for the past week or so. Pt alert and calm at this time and denies wanting to harm himself. ACT team took out IVC papers on pt due to concerns for his safety and his unwillingness to come to ED for further evaluation. Staff at care home report pt has not been taking his medications daily. [facility] after a two year admission.

## 2018-10-06 NOTE — ED Notes (Signed)
Pt. Transferred to [facility] from ED to room 3 after screening for contraband. Report to include Situation, Background, Assessment and Recommendations from [place] RN. Pt. Oriented to unit including Q15 minute rounds as well as the security cameras for their protection. Patient is alert and oriented, warm and dry in no acute distress. Patient denies SI, HI, and AVH. Pt. Encouraged to let me know if needs arise. 

## 2018-10-06 NOTE — ED Notes (Signed)
Hourly rounding reveals patient sleeping in room. No complaints, stable, in no acute distress. Q15 minute rounds and monitoring via Security Cameras to continue. 
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
## 2018-10-06 NOTE — ED Notes (Signed)
Pt dressed into burgundy scrubs with Sue LushAndrea, RN, myself and [place] present.  Labs drawn and sent to lab.  Pts belongings, which include sweatshirt, sweatpants, flannel jacket, socks, shoes and underwear, were bagged and placed at the nurses station.

## 2018-10-07 ENCOUNTER — Inpatient Hospital Stay
Admission: AD | Admit: 2018-10-07 | Discharge: 2018-10-21 | DRG: 885 | Disposition: A | Discharge status: Home / Self Care | Payer: Medicaid Other | Attending: Psychiatry | Admitting: Psychiatry

## 2018-10-07 ENCOUNTER — Encounter: Payer: Self-pay | Admitting: Emergency Medicine

## 2018-10-07 ENCOUNTER — Other Ambulatory Visit: Payer: Self-pay

## 2018-10-07 DIAGNOSIS — Z888 Allergy status to other drugs, medicaments and biological substances status: Secondary | ICD-10-CM

## 2018-10-07 DIAGNOSIS — Z9119 Patient's noncompliance with other medical treatment and regimen: Secondary | ICD-10-CM | POA: Diagnosis not present

## 2018-10-07 DIAGNOSIS — R45851 Suicidal ideations: Secondary | ICD-10-CM | POA: Diagnosis present

## 2018-10-07 DIAGNOSIS — Z7982 Long term (current) use of aspirin: Secondary | ICD-10-CM | POA: Diagnosis not present

## 2018-10-07 DIAGNOSIS — F250 Schizoaffective disorder, bipolar type: Secondary | ICD-10-CM | POA: Diagnosis present

## 2018-10-07 DIAGNOSIS — Z89422 Acquired absence of other left toe(s): Secondary | ICD-10-CM

## 2018-10-07 DIAGNOSIS — J449 Chronic obstructive pulmonary disease, unspecified: Secondary | ICD-10-CM | POA: Diagnosis present

## 2018-10-07 DIAGNOSIS — I10 Essential (primary) hypertension: Secondary | ICD-10-CM | POA: Diagnosis present

## 2018-10-07 DIAGNOSIS — L408 Other psoriasis: Secondary | ICD-10-CM | POA: Diagnosis present

## 2018-10-07 DIAGNOSIS — K219 Gastro-esophageal reflux disease without esophagitis: Secondary | ICD-10-CM | POA: Diagnosis present

## 2018-10-07 DIAGNOSIS — F17210 Nicotine dependence, cigarettes, uncomplicated: Secondary | ICD-10-CM | POA: Diagnosis present

## 2018-10-07 DIAGNOSIS — G40909 Epilepsy, unspecified, not intractable, without status epilepticus: Secondary | ICD-10-CM | POA: Diagnosis present

## 2018-10-07 DIAGNOSIS — Z885 Allergy status to narcotic agent status: Secondary | ICD-10-CM

## 2018-10-07 DIAGNOSIS — Z915 Personal history of self-harm: Secondary | ICD-10-CM | POA: Diagnosis not present

## 2018-10-07 DIAGNOSIS — L409 Psoriasis, unspecified: Secondary | ICD-10-CM | POA: Diagnosis present

## 2018-10-07 DIAGNOSIS — F209 Schizophrenia, unspecified: Secondary | ICD-10-CM | POA: Diagnosis not present

## 2018-10-07 DIAGNOSIS — R569 Unspecified convulsions: Secondary | ICD-10-CM

## 2018-10-07 DIAGNOSIS — Z814 Family history of other substance abuse and dependence: Secondary | ICD-10-CM

## 2018-10-07 DIAGNOSIS — Z5181 Encounter for therapeutic drug level monitoring: Secondary | ICD-10-CM

## 2018-10-07 DIAGNOSIS — G4700 Insomnia, unspecified: Secondary | ICD-10-CM | POA: Diagnosis present

## 2018-10-07 DIAGNOSIS — Z716 Tobacco abuse counseling: Secondary | ICD-10-CM | POA: Diagnosis not present

## 2018-10-07 DIAGNOSIS — Z79899 Other long term (current) drug therapy: Secondary | ICD-10-CM

## 2018-10-07 DIAGNOSIS — Z818 Family history of other mental and behavioral disorders: Secondary | ICD-10-CM | POA: Diagnosis not present

## 2018-10-07 DIAGNOSIS — F411 Generalized anxiety disorder: Secondary | ICD-10-CM | POA: Diagnosis present

## 2018-10-07 DIAGNOSIS — Z880 Allergy status to penicillin: Secondary | ICD-10-CM

## 2018-10-07 LAB — URINE DRUG SCREEN, QUALITATIVE (ARMC ONLY)
Amphetamines, Ur Screen: NOT DETECTED
BARBITURATES, UR SCREEN: NOT DETECTED
BENZODIAZEPINE, UR SCRN: NOT DETECTED
Cannabinoid 50 Ng, Ur ~~LOC~~: NOT DETECTED
Cocaine Metabolite,Ur ~~LOC~~: NOT DETECTED
MDMA (Ecstasy)Ur Screen: NOT DETECTED
Methadone Scn, Ur: NOT DETECTED
Opiate, Ur Screen: NOT DETECTED
Phencyclidine (PCP) Ur S: NOT DETECTED
TRICYCLIC, UR SCREEN: NOT DETECTED

## 2018-10-07 MED ORDER — TRIAMCINOLONE ACETONIDE 0.1 % EX CREA
TOPICAL_CREAM | Freq: Two times a day (BID) | CUTANEOUS | Status: DC
  Filled 2018-10-07: qty 15

## 2018-10-07 MED ORDER — TRIAMCINOLONE 0.1 % CREAM:EUCERIN CREAM 1:1: TOPICAL_CREAM | Freq: Two times a day (BID) | CUTANEOUS | Status: DC

## 2018-10-07 MED ORDER — ALBUTEROL SULFATE (2.5 MG/3ML) 0.083% IN NEBU
2.5000 mg | INHALATION_SOLUTION | Freq: Four times a day (QID) | RESPIRATORY_TRACT | Status: DC
  Administered 2018-10-08: 2.5 mg via RESPIRATORY_TRACT
  Filled 2018-10-07: qty 3

## 2018-10-07 MED ORDER — LATANOPROST 0.005 % OP SOLN
1.0000 [drp] | Freq: Every day | OPHTHALMIC | Status: DC
  Administered 2018-10-07 – 2018-10-20 (×13): 1 [drp] via OPHTHALMIC
  Filled 2018-10-07 (×2): qty 2.5

## 2018-10-07 MED ORDER — OLANZAPINE 10 MG PO TABS
20.0000 mg | ORAL_TABLET | Freq: Two times a day (BID) | ORAL | Status: DC
  Administered 2018-10-07 – 2018-10-21 (×27): 20 mg via ORAL
  Filled 2018-10-07 (×29): qty 2

## 2018-10-07 MED ORDER — ASPIRIN EC 81 MG PO TBEC
81.0000 mg | DELAYED_RELEASE_TABLET | Freq: Every day | ORAL | Status: DC
  Administered 2018-10-08 – 2018-10-21 (×14): 81 mg via ORAL
  Filled 2018-10-07 (×15): qty 1

## 2018-10-07 MED ORDER — ACETAMINOPHEN 325 MG PO TABS
650.0000 mg | ORAL_TABLET | Freq: Four times a day (QID) | ORAL | Status: DC | PRN
  Administered 2018-10-09: 650 mg via ORAL
  Filled 2018-10-07: qty 2

## 2018-10-07 MED ORDER — NICOTINE 21 MG/24HR TD PT24
21.0000 mg | MEDICATED_PATCH | Freq: Every day | TRANSDERMAL | Status: DC
  Administered 2018-10-07: 21 mg via TRANSDERMAL

## 2018-10-07 MED ORDER — HYDROXYZINE HCL 50 MG PO TABS
50.0000 mg | ORAL_TABLET | Freq: Three times a day (TID) | ORAL | Status: DC | PRN
  Administered 2018-10-08 – 2018-10-15 (×6): 50 mg via ORAL
  Filled 2018-10-07 (×6): qty 1

## 2018-10-07 MED ORDER — HYDROCERIN EX CREA
TOPICAL_CREAM | Freq: Two times a day (BID) | CUTANEOUS | Status: DC
  Filled 2018-10-07: qty 113

## 2018-10-07 MED ORDER — TRAZODONE HCL 100 MG PO TABS
100.0000 mg | ORAL_TABLET | Freq: Every evening | ORAL | Status: DC | PRN
  Administered 2018-10-07 – 2018-10-12 (×6): 100 mg via ORAL
  Filled 2018-10-07 (×6): qty 1

## 2018-10-07 MED ORDER — MAGNESIUM HYDROXIDE 400 MG/5ML PO SUSP: 30.0000 mL | Freq: Every day | ORAL | Status: DC | PRN

## 2018-10-07 MED ORDER — POLYETHYLENE GLYCOL 3350 17 G PO PACK
17.0000 g | PACK | Freq: Two times a day (BID) | ORAL | Status: DC
  Administered 2018-10-11 – 2018-10-20 (×8): 17 g via ORAL
  Filled 2018-10-07 (×14): qty 1

## 2018-10-07 MED ORDER — TAMSULOSIN HCL 0.4 MG PO CAPS
0.4000 mg | ORAL_CAPSULE | Freq: Every day | ORAL | Status: DC
  Administered 2018-10-07 – 2018-10-21 (×9): 0.4 mg via ORAL
  Filled 2018-10-07 (×13): qty 1

## 2018-10-07 MED ORDER — VITAMIN D 25 MCG (1000 UNIT) PO TABS
2000.0000 [IU] | ORAL_TABLET | Freq: Every day | ORAL | Status: DC
  Administered 2018-10-07 – 2018-10-21 (×15): 2000 [IU] via ORAL
  Filled 2018-10-07 (×15): qty 2

## 2018-10-07 MED ORDER — SENNA 8.6 MG PO TABS
1.0000 | ORAL_TABLET | Freq: Two times a day (BID) | ORAL | Status: DC
  Administered 2018-10-07 – 2018-10-20 (×11): 8.6 mg via ORAL
  Filled 2018-10-07 (×20): qty 1

## 2018-10-07 MED ORDER — NICOTINE 21 MG/24HR TD PT24
MEDICATED_PATCH | TRANSDERMAL | Status: AC
  Filled 2018-10-07: qty 1

## 2018-10-07 MED ORDER — LEVETIRACETAM 500 MG PO TABS
1000.0000 mg | ORAL_TABLET | Freq: Two times a day (BID) | ORAL | Status: DC
  Administered 2018-10-07 – 2018-10-21 (×27): 1000 mg via ORAL
  Filled 2018-10-07 (×28): qty 2

## 2018-10-07 MED ORDER — ALUM & MAG HYDROXIDE-SIMETH 200-200-20 MG/5ML PO SUSP
30.0000 mL | ORAL | Status: DC | PRN
  Administered 2018-10-14 – 2018-10-16 (×4): 30 mL via ORAL
  Filled 2018-10-07 (×4): qty 30

## 2018-10-07 MED ORDER — FAMOTIDINE 20 MG PO TABS
10.0000 mg | ORAL_TABLET | Freq: Two times a day (BID) | ORAL | Status: DC
  Administered 2018-10-07: 10 mg via ORAL

## 2018-10-07 MED ORDER — NICOTINE 21 MG/24HR TD PT24
21.0000 mg | MEDICATED_PATCH | Freq: Every day | TRANSDERMAL | Status: DC
  Administered 2018-10-08 – 2018-10-09 (×2): 21 mg via TRANSDERMAL
  Filled 2018-10-07 (×6): qty 1

## 2018-10-07 MED ORDER — FAMOTIDINE 20 MG PO TABS
ORAL_TABLET | ORAL | Status: AC
  Filled 2018-10-07: qty 1

## 2018-10-07 MED ORDER — LOXAPINE SUCCINATE 5 MG PO CAPS
25.0000 mg | ORAL_CAPSULE | Freq: Three times a day (TID) | ORAL | Status: DC
  Administered 2018-10-08 – 2018-10-13 (×16): 25 mg via ORAL
  Filled 2018-10-07 (×21): qty 1

## 2018-10-07 MED ORDER — FAMOTIDINE 20 MG PO TABS
10.0000 mg | ORAL_TABLET | Freq: Two times a day (BID) | ORAL | Status: DC
  Administered 2018-10-07 – 2018-10-21 (×27): 10 mg via ORAL
  Filled 2018-10-07 (×28): qty 1

## 2018-10-07 NOTE — ED Notes (Signed)
Hourly rounding reveals patient in room talking to TTS. No complaints, stable, in no acute distress. Q15 minute rounds and monitoring via [employer] Cam[employer] to continue. 
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
## 2018-10-07 NOTE — ED Provider Notes (Signed)
-----------------------------------------   8:13 AM on 10/07/2018 -----------------------------------------   Blood pressure (!) 137/91, pulse 90, temperature 98.1 F (36.7 C), temperature source Oral, resp. rate 18, height 5\' 7"  (1.702 m), weight 78 kg, SpO2 98 %.  The patient had no acute events since last update.  Calm and cooperative at this time.  Disposition is pending Psychiatry/Behavioral Medicine team recommendations.     Arnaldo NatalMalinda,  F, MD 10/07/18 [phone]

## 2018-10-07 NOTE — ED Notes (Signed)
Hourly rounding reveals patient sleeping in room. No complaints, stable, in no acute distress. Q15 minute rounds and monitoring via Security Cameras to continue. 

## 2018-10-07 NOTE — Plan of Care (Addendum)
Patient found in hallway upon my arrival. Patient is isolative to his room most of the evening. Neither visible nor social. When patient was asked to go to group, he states, "I don't like to be in a room with strangers." Encouraged attendance but patient refused. Patient did come out to eat a snack. Appetite good. Mood and affect are depressed. Denies SI/HI/AVH at this time and contracts for safety while on the unit. Compliant and polite. EKG completed and abnormal. Dr. Toni Amendlapacs notified. No new orders received. Urine collected and brought to lab. Compliant with all HS medications aside from Miralax and Nebulizer Tx which he refused. Processing is impaired and education requires reinforcement. Patient is mildly confused but easily redirectable and able to follow instructions. Q 15 minute checks maintained. Will continue to monitor throughout the shift.  @0400 , patient comes to nursing station agitated. States, "I need you to tell them I don't want that Miralax because I just had a movement and I can't get the toilet paper started." Patient offered hygiene assistance and shower chair. Patient adamantly refuses to shower. Patient is more confused now than earlier. Returns to nursing station 5 times to say the same thing. Patient is agitated and anxious. When asked why he is so upset, patient says, "I don't know. Please don't make me take a shower." He repeats this several times. Given Vistaril for agitation. Will monitor for efficacy.  Patient slept 7.25 hours. No apparent distress. @ 0600, patient comes to nursing station and states that he will not be eating anything today. Reports he does not want to "live like this anymore." Patient requests I tell the doctor.  Will endorse care to oncoming shift.  Problem: Medication: Goal: Compliance with prescribed medication regimen will improve Outcome: Progressing   Problem: Coping: Goal: Coping ability will improve Outcome: Progressing   Problem:  Education: Goal: Verbalization of understanding the information provided will improve Outcome: Progressing

## 2018-10-07 NOTE — BH Assessment (Signed)
TTS attempted to contact pt's legal guardian Jason Patterson(Jason Patterson - [phone]) to update her on pt's disposition and plan of care. Pt's legal guardian was unavailable and TTS callback number was left with on-call staff member.

## 2018-10-07 NOTE — ED Notes (Signed)
Pt observed lying in bed   Pt visualized with NAD  No verbalized needs or concerns at this time  Continue to monitor 

## 2018-10-07 NOTE — ED Notes (Signed)
ED Is the patient under IVC or is there intent for IVC: Yes.   Is the patient medically cleared: Yes.   Is there vacancy in the ED BHU: Yes.   Is the population mix appropriate for patient: Yes.   Is the patient awaiting placement in inpatient or outpatient setting: Yes.   Has the patient had a psychiatric consult: Yes.   Survey of unit performed for contraband, proper placement and condition of furniture, tampering with fixtures in bathroom, shower, and each patient room: Yes.  ; Findings:  APPEARANCE/BEHAVIOR Calm and cooperative NEURO ASSESSMENT Orientation: oriented x3  Denies pain Hallucinations:   Speech: Normal Gait: normal RESPIRATORY ASSESSMENT Even  Unlabored respirations  CARDIOVASCULAR ASSESSMENT Pulses equal   regular rate  Skin warm and dry   GASTROINTESTINAL ASSESSMENT no GI complaint EXTREMITIES Full ROM  PLAN OF CARE Provide calm/safe environment. Vital signs assessed twice daily. ED BHU Assessment once each 12-hour shift. Collaborate with TTS daily or as condition indicates. Assure the ED provider has rounded once each shift. Provide and encourage hygiene. Provide redirection as needed. Assess for escalating behavior; address immediately and inform ED provider.  Assess family dynamic and appropriateness for visitation as needed: Yes.  ; If necessary, describe findings:  Educate the patient/family about BHU procedures/visitation: Yes.  ; If necessary, describe findings:

## 2018-10-07 NOTE — ED Notes (Signed)
Hourly rounding reveals patient in room talking to SOC. No complaints, stable, in no acute distress. Q15 minute rounds and monitoring via Security Cameras to continue. 

## 2018-10-07 NOTE — BH Assessment (Signed)
[facility] by Dr. Toni Amendlapacs.  Attending Physician will be Dr. Toni Amendlapacs.   Patient has [name].   Intake Paper Work has been signed and placed on patient chart.  ER staff is aware of the admission:  Misty StanleyLisa, ER Secretary    Dr. Darnelle CatalanMalinda, ER MD   Amy T, Patient's Nurse   Ethelene BrownsAnthony, Patient Access.

## 2018-10-07 NOTE — ED Notes (Addendum)
BEHAVIORAL HEALTH ROUNDING Patient sleeping: No. Patient alert: yes Behavior appropriate: Yes.  ; If no, describe:  Nutrition and fluids offered: yes Toileting and hygiene offered: Yes  Sitter present: q15 minute observations and security camera monitoring Law enforcement present: Yes  ODS  

## 2018-10-07 NOTE — ED Notes (Addendum)
BEHAVIORAL HEALTH ROUNDING Patient sleeping: No. Patient alert : yes Behavior appropriate: Yes.  ; If no, describe:  Nutrition and fluids offered: yes Toileting and hygiene offered: Yes  Sitter present: q15 minute observations and security camra monitoring Law enforcement present: Yes  ODS  ENVIRONMENTAL ASSESSMENT Potentially harmful objects out of patient reach: Yes.   Personal belongings secured: Yes.   Patient dressed in hospital provided attire only: Yes.   Plastic bags out of patient reach: Yes.   Patient care equipment (cords, cables, call bells, lines, and drains) shortened, removed, or accounted for: Yes.   Equipment and supplies removed from bottom of stretcher: Yes.   Potentially toxic materials out of patient reach: Yes.   Sharps container removed or out of patient reach: Yes.

## 2018-10-07 NOTE — Progress Notes (Signed)
Admission Note:  [age] yr male who presents IVC in no acute distress for the treatment of SI and Depression. Pt appears flat and depressed. Pt was calm and cooperative with admission process. Pt denies SI doing admission process. Pt denies AVH . Patient [facility] and [facility] and he was brought to hospital. Skin was assessed and found to be clear of any abnormal marks. 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 PT searched and no contraband found, POC and unit policies explained and understanding verbalized. Consents obtained. Food and fluids offered, and fluids accepted. Pt had no additional questions or concerns.

## 2018-10-07 NOTE — Plan of Care (Signed)
  Problem: Education: Goal: Knowledge of [facility] Education information/materials will improve Note:  Instructed on information regarding [place] and unit programing . Verbalize understanding
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
## 2018-10-07 NOTE — Tx Team (Signed)
Initial Treatment Plan 10/07/2018 6:06 PM Jason ChestnutGordon L Carlucci [id number]    PATIENT STRESSORS: Financial difficulties Other: Home care staff patient staes he can't get along with staff feels mistreated and not liked by staff   PATIENT STRENGTHS: Ability for insight General fund of knowledge Motivation for treatment/growth   PATIENT IDENTIFIED PROBLEMS: "Anxiety"  "Depression"  "Suicide Thoughts"                 DISCHARGE CRITERIA:  Adequate post-discharge living arrangements Improved stabilization in mood, thinking, and/or behavior  PRELIMINARY DISCHARGE PLAN: Return to previous living arrangement  PATIENT/FAMILY INVOLVEMENT: This treatment plan has been presented to and reviewed with the patient, Jason Patterson.  The patient  Has been given the opportunity to ask questions and make suggestions.  Curly RimBarbara B Allona Gondek, RN 10/07/2018, 6:06 PM

## 2018-10-07 NOTE — ED Notes (Signed)
BEHAVIORAL HEALTH ROUNDING Patient sleeping: No. Patient alert: yes Behavior appropriate: Yes.  ; If no, describe:  Nutrition and fluids offered: yes Toileting and hygiene offered: Yes  Sitter present: q15 minute observations and security camera monitoring Law enforcement present: Yes  ODS  

## 2018-10-07 NOTE — ED Notes (Signed)
Referral information for Psychiatric Hospitalization faxed to;   [facility]. [facility] [phone]),   . [facility] [phone], [phone] or [phone]),   . [facility] [phone]),   . [facility] [phone]),   . [facility] [phone] or [phone])  . [place] [phone] or [phone]),   . [facility] [phone]).

## 2018-10-07 NOTE — BH Assessment (Signed)
This Clinical research associatewriter spoke with pt's legal guardian [name](Shiela Marshall - [phone]) who reports pt's [place] benefits were discontinued while [facility]. [facility].  She reports pt has missed his scheduled ECT treatment appointments with Dr. Toni Amendlapacs because "he was being turned away by the admitting office because he did not have an I.D. and his Medicaid was not active". She reports that she is currently in the process of getting his  Medicaid benefits reinstated.   Legal Guardian was also informed of pt's current disposition and plan of care. She reports she will also fax legal guardianship paperwork to [phone].

## 2018-10-07 NOTE — Tx Team (Signed)
Initial Treatment Plan 10/07/2018 5:42 PM Horald ChestnutGordon L Faddis JYN:[id number]    PATIENT STRESSORS: Financial difficulties Health problems Medication change or noncompliance   PATIENT STRENGTHS: Other: Able to walk  Able to talk    PATIENT IDENTIFIED PROBLEMS: Suicidal 10/07/18  Depression 10/07/18                   DISCHARGE CRITERIA:  Improved stabilization in mood, thinking, and/or behavior Medical problems require only outpatient monitoring  PRELIMINARY DISCHARGE PLAN: Outpatient therapy Return to previous living arrangement  PATIENT/FAMILY INVOLVEMENT: This treatment plan has been presented to and reviewed with the patient, Horald ChestnutGordon L Chasteen, and/or family member,  The patient and family have been given the opportunity to ask questions and make suggestions.  Crist InfanteGwen A Serine Kea, RN 10/07/2018, 5:42 PM

## 2018-10-08 ENCOUNTER — Other Ambulatory Visit: Payer: Self-pay | Admitting: Psychiatry

## 2018-10-08 DIAGNOSIS — F250 Schizoaffective disorder, bipolar type: Principal | ICD-10-CM

## 2018-10-08 LAB — LIPID PANEL
CHOLESTEROL: 133 mg/dL (ref 0–200)
HDL: 49 mg/dL (ref >40)
LDL Cholesterol: 70 mg/dL (ref 0–99)
TRIGLYCERIDES: 72 mg/dL (ref <150)
Total CHOL/HDL Ratio: 2.7 RATIO
VLDL: 14 mg/dL (ref 0–40)

## 2018-10-08 LAB — HEMOGLOBIN A1C
HEMOGLOBIN A1C: 5.3 % (ref 4.8–5.6)
MEAN PLASMA GLUCOSE: 105.41 mg/dL

## 2018-10-08 LAB — TSH: TSH: 0.56 u[IU]/mL (ref 0.350–4.500)

## 2018-10-08 MED ORDER — ENSURE ENLIVE PO LIQD
237.0000 mL | Freq: Three times a day (TID) | ORAL | Status: DC
  Administered 2018-10-08 – 2018-10-21 (×35): 237 mL via ORAL

## 2018-10-08 MED ORDER — ADULT MULTIVITAMIN W/MINERALS CH
1.0000 | ORAL_TABLET | Freq: Every day | ORAL | Status: DC
  Administered 2018-10-08 – 2018-10-21 (×14): 1 via ORAL
  Filled 2018-10-08 (×14): qty 1
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
## 2018-10-08 NOTE — [facility] Group Notes (Signed)
LCSW Group Therapy Note   10/08/2018 1:00 pm   Type of Therapy and Topic:  Group Therapy:  Overcoming Obstacles   Participation Level:  Did Not Attend   Description of Group:    In this group patients will be encouraged to explore what they see as obstacles to their own wellness and recovery. They will be guided to discuss their thoughts, feelings, and behaviors related to these obstacles. The group will process together ways to cope with barriers, with attention given to specific choices patients can make. Each patient will be challenged to identify changes they are motivated to make in order to overcome their obstacles. This group will be process-oriented, with patients participating in exploration of their own experiences as well as giving and receiving support and challenge from other group members.   Therapeutic Goals: 1. Patient will identify personal and current obstacles as they relate to admission. 2. Patient will identify barriers that currently interfere with their wellness or overcoming obstacles.  3. Patient will identify feelings, thought process and behaviors related to these barriers. 4. Patient will identify two changes they are willing to make to overcome these obstacles:      Summary of Patient Progress      Therapeutic Modalities:   Cognitive Behavioral Therapy Solution Focused Therapy Motivational Interviewing Relapse Prevention Therapy  Alease FrameSonya S Seneca Gadbois, LCSW 10/08/2018 3:17 PM

## 2018-10-08 NOTE — Plan of Care (Signed)
Patient stayed in bed most of the time.States "I don't feel like doing anything.I am going to have ECT tomorrow."Refused to take shower or even to wash his face and put lotion.Patient stated that he is sad and depressed because the lady over the family care home was not treating him good.States "no body likes me over there."Patient is less anxious than this morning.Support and encouragement given.Denies SI,HI and AVH.Compliant with medications.
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
## 2018-10-08 NOTE — Progress Notes (Signed)
Recreation Therapy Notes  Date: 10/08/2018  Time: 9:30 am   Location: Craft room   Behavioral response: N/A   Intervention Topic: Emotions  Discussion/Intervention: Patient did not attend group.   Clinical Observations/Feedback:  Patient did not attend group.   Raylene Carmickle LRT/CTRS        Jason Patterson 10/08/2018 10:39 AM

## 2018-10-08 NOTE — Progress Notes (Signed)
Patient stated that he did not want anymore treatments today. He also stated that he did not take this at home and really did not want to take here.

## 2018-10-08 NOTE — Progress Notes (Signed)
Initial Nutrition Assessment  DOCUMENTATION CODES:   Not applicable  INTERVENTION:   Ensure Enlive po BID, each supplement provides 350 kcal and 20 grams of protein  MVI daily  NUTRITION DIAGNOSIS:   Increased nutrient needs related to chronic illness(COPD) as evidenced by icnreased estimated needs.  GOAL:   Patient will meet greater than or equal to 90% of their needs  MONITOR:   PO intake, Supplement acceptance  REASON FOR ASSESSMENT:   Malnutrition Screening Tool    ASSESSMENT:   62 y/o male with h/o bipolar, depression, schizoaffective disorder, COPD, GERD and seizures admitted for SI   Per chart, pt with good oral intake since admit. Pt eating meals and snacks. There is no recent weight history in chart to determine if any true weight loss. RD will add supplements and MVI in setting of COPD. Pt has been seen previously by an RD and diagnosed with malnutrition in 2016. Pt noted at that time to be edentulous and was placed in dysphagia 3 diet; can downgrade diet at pt's request if needed.    Medications reviewed and include: aspirin, vitamin D3, pepcid, nicotine, miralax, senokot  Labs reviewed:   Diet Order:   Diet Order            Diet regular Room service appropriate? Yes; Fluid consistency: Thin  Diet effective now             EDUCATION NEEDS:   No education needs have been identified at this time  Skin:  Skin Assessment: Reviewed RN Assessment  Last BM:  pta  Height:   Ht Readings from Last 1 Encounters:  10/07/18 5' 5.75" (1.67 m)    Weight:   Wt Readings from Last 1 Encounters:  10/07/18 71.2 kg    Ideal Body Weight:  63.8 kg  BMI:  Body mass index is 25.54 kg/m.  Estimated Nutritional Needs:   Kcal:  1800-2100kcal/day   Protein:  85-100g/day   Fluid:  >1.8L/day   Betsey Holidayasey Akia Montalban MS, RD, LDN Pager #- [phone] Office#- [phone] After Hours Pager: [phone]

## 2018-10-09 ENCOUNTER — Inpatient Hospital Stay: Payer: Medicaid Other | Admitting: Anesthesiology

## 2018-10-09 LAB — GLUCOSE, CAPILLARY: Glucose-Capillary: 94 mg/dL (ref 70–99)

## 2018-10-09 MED ORDER — SUCCINYLCHOLINE CHLORIDE 20 MG/ML IJ SOLN
INTRAMUSCULAR | Status: AC
  Filled 2018-10-09: qty 1

## 2018-10-09 MED ORDER — SUCCINYLCHOLINE CHLORIDE 20 MG/ML IJ SOLN
INTRAMUSCULAR | Status: DC | PRN
  Administered 2018-10-09: 90 mg via INTRAVENOUS

## 2018-10-09 MED ORDER — METOPROLOL TARTRATE 25 MG PO TABS
12.5000 mg | ORAL_TABLET | Freq: Two times a day (BID) | ORAL | Status: DC
  Administered 2018-10-09 – 2018-10-21 (×23): 12.5 mg via ORAL
  Filled 2018-10-09 (×24): qty 1

## 2018-10-09 MED ORDER — SODIUM CHLORIDE 0.9 % IV SOLN
500.0000 mL | Freq: Once | INTRAVENOUS | Status: AC
  Administered 2018-10-09: 500 mL via INTRAVENOUS

## 2018-10-09 MED ORDER — GLYCOPYRROLATE 0.2 MG/ML IJ SOLN
INTRAMUSCULAR | Status: AC
  Administered 2018-10-09: 0.1 mg via INTRAVENOUS
  Filled 2018-10-09: qty 1

## 2018-10-09 MED ORDER — KETOROLAC TROMETHAMINE 30 MG/ML IJ SOLN
30.0000 mg | Freq: Once | INTRAMUSCULAR | Status: AC
  Administered 2018-10-09: 30 mg via INTRAVENOUS

## 2018-10-09 MED ORDER — METHOHEXITAL SODIUM 100 MG/10ML IV SOSY
PREFILLED_SYRINGE | INTRAVENOUS | Status: DC | PRN
  Administered 2018-10-09: 70 mg via INTRAVENOUS

## 2018-10-09 MED ORDER — ALBUTEROL SULFATE (2.5 MG/3ML) 0.083% IN NEBU: 2.5000 mg | INHALATION_SOLUTION | RESPIRATORY_TRACT | Status: DC | PRN

## 2018-10-09 MED ORDER — KETOROLAC TROMETHAMINE 30 MG/ML IJ SOLN
INTRAMUSCULAR | Status: AC
  Administered 2018-10-09: 30 mg via INTRAVENOUS
  Filled 2018-10-09: qty 1

## 2018-10-09 MED ORDER — GLYCOPYRROLATE 0.2 MG/ML IJ SOLN
0.1000 mg | Freq: Once | INTRAMUSCULAR | Status: AC
  Administered 2018-10-09: 0.1 mg via INTRAVENOUS

## 2018-10-09 MED ORDER — METHOHEXITAL SODIUM 0.5 G IJ SOLR
INTRAMUSCULAR | Status: AC
  Filled 2018-10-09: qty 500
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
## 2018-10-09 NOTE — Progress Notes (Signed)
Recreation Therapy Notes  Date: 10/09/2018  Time: 9:30 am   Location: Craft room   Behavioral response: N/A   Intervention Topic: Communication  Discussion/Intervention: Patient did not attend group.   Clinical Observations/Feedback:  Patient did not attend group.   Jamani Eley LRT/CTRS        Keesha Pellum 10/09/2018 11:13 AM

## 2018-10-09 NOTE — Anesthesia Procedure Notes (Signed)
Performed by: Kitty Cadavid, CRNA Pre-anesthesia Checklist: Patient identified, Emergency Drugs available, Suction available and Patient being monitored Patient Re-evaluated:Patient Re-evaluated prior to induction Oxygen Delivery Method: Circle system utilized Preoxygenation: Pre-oxygenation with 100% oxygen Induction Type: IV induction Ventilation: Mask ventilation without difficulty and Mask ventilation throughout procedure Airway Equipment and Method: Bite block Placement Confirmation: positive ETCO2 Dental Injury: Teeth and Oropharynx as per pre-operative assessment        

## 2018-10-09 NOTE — Progress Notes (Signed)
Received Jason Patterson this AM at the nurses station before his ECT  with several requests. Shortly thereafter he was transported to the PACU for ECT. He returned from his procedure at 1120 AM. He is alert, urinated and drink a cup of orange juice. He ate lunch, took his medications and took a nap. He has been medication compliant throughout the day.

## 2018-10-09 NOTE — Anesthesia Postprocedure Evaluation (Signed)
Anesthesia Post Note  Patient: Jason Patterson  Procedure(s) Performed: ECT TX  Patient location during evaluation: PACU Anesthesia Type: General Level of consciousness: awake and alert Pain management: pain level controlled Vital Signs Assessment: post-procedure vital signs reviewed and stable Respiratory status: spontaneous breathing, nonlabored ventilation and respiratory function stable Cardiovascular status: blood pressure returned to baseline and stable Postop Assessment: no apparent nausea or vomiting Anesthetic complications: no     Last Vitals:  Vitals:   10/09/18 1041 10/09/18 1101  BP: (!) 135/96 (!) 141/97  Pulse: 94   Resp: 18   Temp: 37.2 C 36.7 C  SpO2: 100%     Last Pain:  Vitals:   10/09/18 1140  TempSrc:   PainSc: 0-No pain                 Jovita GammaKathryn L Fitzgerald
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
## 2018-10-09 NOTE — Transfer of Care (Signed)
Immediate Anesthesia Transfer of Care Note  Patient: Jason Patterson  Procedure(s) Performed: ECT TX  Patient Location: PACU  Anesthesia Type:General  Level of Consciousness: drowsy  Airway & Oxygen Therapy: Patient Spontanous Breathing and Patient connected to face mask oxygen  Post-op Assessment: Report given to RN  Post vital signs: stable  Last Vitals:  Vitals Value Taken Time  BP    Temp 37.2 C 10/09/2018 10:41 AM  Pulse 94 10/09/2018 10:44 AM  Resp 19 10/09/2018 10:44 AM  SpO2 100 % 10/09/2018 10:44 AM  Vitals shown include unvalidated device data.  Last Pain:  Vitals:   10/09/18 1003  TempSrc: Oral  PainSc:          Complications: No apparent anesthesia complications

## 2018-10-09 NOTE — Anesthesia Post-op Follow-up Note (Signed)
Anesthesia QCDR form completed.        

## 2018-10-09 NOTE — Plan of Care (Addendum)
Patient found awake in bed upon my arrival. Patient is isolative to bed throughout the evening. Patient will refuse anything if he has to leave his room to obtain it. Patient is neither visible nor social. Remains depressed and anxious, affect congruent. Voices passive SI and hopelessness. "I can't keep living like this." Contracts for safety while on the unit. Patient continues to refuse hygiene. "I can't. Don't make me. I can't." Patient continues bizarre behavior of running out of his room, banging on nursing station window, demanding something, and then running back into his room. Processing impaired, reinforcement of education required. Mild to moderate confusion noted to continue. Denies HI/AVH. Denies pain. Patient drinks his Ensure but will not eat a snack. Continues to refuse nebulizer Tx, O2Sat 96%. Compliant with eye gtts and given Trazodone for sleep. Refuses to participate in milieu. Provided reassurance and emotional support. Q 15 minute checks maintained. Will continue to monitor throughout the shift. Patient slept 7.75 hours. No apparent distress. CBG 97. NPO status maintained. Will endorse care to oncoming shift.  Problem: Medication: Goal: Compliance with prescribed medication regimen will improve Outcome: Progressing   Problem: Self-Concept: Goal: Ability to disclose and discuss suicidal ideas will improve Outcome: Progressing   Problem: Coping: Goal: Coping ability will improve Outcome: Not Progressing   Problem: Education: Goal: Emotional status will improve Outcome: Not Progressing

## 2018-10-09 NOTE — Tx Team (Addendum)
Interdisciplinary Treatment and Diagnostic Plan Update  10/09/2018 Time of Session: 2:00 PM Horald ChestnutGordon L Cen MRN: [id number]  Principal Diagnosis: Schizoaffective disorder, bipolar type [facility](HCC)  Secondary Diagnoses: Principal Problem:   Schizoaffective disorder, bipolar type (HCC) Active Problems:   Essential hypertension   COPD (chronic obstructive pulmonary disease) (HCC)   GERD   PSORIASIS   Seizures (HCC)   Current Medications:  Current Facility-Administered Medications  Medication Dose Route Frequency Provider Last Rate Last Dose  . acetaminophen (TYLENOL) tablet 650 mg  650 mg Oral Q6H PRN Clapacs, John T, MD      . albuterol (PROVENTIL) (2.5 MG/3ML) 0.083% nebulizer solution 2.5 mg  2.5 mg Inhalation Q4H PRN Clapacs, John T, MD      . alum & mag hydroxide-simeth (MAALOX/MYLANTA) 200-200-20 MG/5ML suspension 30 mL  30 mL Oral Q4H PRN Clapacs, John T, MD      . aspirin EC tablet 81 mg  81 mg Oral Daily Clapacs, Jackquline DenmarkJohn T, MD   81 mg at 10/09/18 1317  . cholecalciferol (VITAMIN D3) tablet 2,000 Units  2,000 Units Oral Daily Clapacs, Jackquline DenmarkJohn T, MD   2,000 Units at 10/09/18 1309  . famotidine (PEPCID) tablet 10 mg  10 mg Oral BID Clapacs, Jackquline DenmarkJohn T, MD   10 mg at 10/09/18 1325  . feeding supplement (ENSURE ENLIVE) (ENSURE ENLIVE) liquid 237 mL  237 mL Oral TID BM Clapacs, John T, MD   237 mL at 10/08/18 2018  . hydrOXYzine (ATARAX/VISTARIL) tablet 50 mg  50 mg Oral TID PRN Clapacs, Jackquline DenmarkJohn T, MD   50 mg at 10/08/18 0423  . latanoprost (XALATAN) 0.005 % ophthalmic solution 1 drop  1 drop Both Eyes QHS Clapacs, John T, MD   1 drop at 10/08/18 2130  . levETIRAcetam (KEPPRA) tablet 1,000 mg  1,000 mg Oral BID Clapacs, Jackquline DenmarkJohn T, MD   1,000 mg at 10/09/18 1311  . loxapine (LOXITANE) capsule 25 mg  25 mg Oral TID Clapacs, Jackquline DenmarkJohn T, MD   25 mg at 10/09/18 1314  . magnesium hydroxide (MILK OF MAGNESIA) suspension 30 mL  30 mL Oral Daily PRN Clapacs, John T, MD      . multivitamin with minerals tablet 1  tablet  1 tablet Oral Daily Clapacs, Jackquline DenmarkJohn T, MD   1 tablet at 10/09/18 1310  . nicotine (NICODERM CQ - dosed in mg/24 hours) patch 21 mg  21 mg Transdermal Daily Clapacs, Jackquline DenmarkJohn T, MD   21 mg at 10/09/18 1323  . OLANZapine (ZYPREXA) tablet 20 mg  20 mg Oral BID Clapacs, Jackquline DenmarkJohn T, MD   20 mg at 10/09/18 1310  . polyethylene glycol (MIRALAX / GLYCOLAX) packet 17 g  17 g Oral BID Clapacs, John T, MD      . senna (SENOKOT) tablet 8.6 mg  1 tablet Oral BID Clapacs, John T, MD   8.6 mg at 10/08/18 1717  . tamsulosin (FLOMAX) capsule 0.4 mg  0.4 mg Oral Daily Clapacs, John T, MD   0.4 mg at 10/08/18 0813  . traZODone (DESYREL) tablet 100 mg  100 mg Oral QHS PRN Clapacs, Jackquline DenmarkJohn T, MD   100 mg at 10/08/18 2130   PTA Medications: Medications Prior to Admission  Medication Sig Dispense Refill Last Dose  . albuterol (PROAIR HFA) 108 (90 BASE) MCG/ACT inhaler Inhale 1 puff into the lungs 4 (four) times daily.    10/08/2018  . aspirin EC 81 MG tablet Take 81 mg by mouth daily.   10/08/2018  . b complex  vitamins capsule Take 1 capsule by mouth daily.   10/08/2018  . l-methylfolate-B6-B12 (METANX) 3-35-2 MG TABS tablet Take 1 tablet by mouth daily.   10/08/2018  . latanoprost (XALATAN) 0.005 % ophthalmic solution Place 1 drop into both eyes at bedtime. 2.5 mL 0 10/08/2018  . levETIRAcetam (KEPPRA) 500 MG tablet Take 1,000 mg by mouth 2 (two) times daily.   10/08/2018  . loxapine (LOXITANE) 25 MG capsule Take 25 mg by mouth 3 (three) times daily.   10/08/2018  . NONFORMULARY OR COMPOUNDED ITEM Apply 1 application topically at bedtime. Triamcinolone cr 0.1% w/  Moisturizing cream Mix 1:1  And apply to dry irritated areas of face and legs   10/08/2018  . OLANZapine (ZYPREXA) 20 MG tablet Take 20 mg by mouth 2 (two) times daily.   10/08/2018  . polyethylene glycol (MIRALAX / GLYCOLAX) packet Take 17 g by mouth 2 (two) times daily.   10/08/2018  . ranitidine (ZANTAC) 150 MG tablet Take 150 mg by mouth 2 (two) times daily.    10/08/2018  . senna (SENOKOT) 8.6 MG TABS tablet Take 1 tablet by mouth 2 (two) times daily.   10/08/2018  . tamsulosin (FLOMAX) 0.4 MG CAPS capsule Take 0.4 mg by mouth daily.    10/08/2018  . Vitamin D, Ergocalciferol, 2000 units CAPS Take 2,000 Units by mouth daily.   10/08/2018    Patient Stressors: Financial difficulties Other: Home care staff patient staes he can't get along with staff feels mistreated and not liked by staff  Patient Strengths: Ability for insight General fund of knowledge Motivation for treatment/growth  Treatment Modalities: Medication Management, Group therapy, Case management,  1 to 1 session with clinician, Psychoeducation, Recreational therapy.   Physician Treatment Plan for Primary Diagnosis: Schizoaffective disorder, bipolar type (HCC) Long Term Goal(s): Improvement in symptoms so as ready for discharge Improvement in symptoms so as ready for discharge   Short Term Goals: Ability to verbalize feelings will improve Ability to disclose and discuss suicidal ideas Ability to demonstrate self-control will improve Ability to maintain clinical measurements within normal limits will improve Compliance with prescribed medications will improve Ability to identify triggers associated with substance abuse/mental health issues will improve  Medication Management: Evaluate patient's response, side effects, and tolerance of medication regimen.  Therapeutic Interventions: 1 to 1 sessions, Unit Group sessions and Medication administration.  Evaluation of Outcomes: Not Progressing  Physician Treatment Plan for Secondary Diagnosis: Principal Problem:   Schizoaffective disorder, bipolar type (HCC) Active Problems:   Essential hypertension   COPD (chronic obstructive pulmonary disease) (HCC)   GERD   PSORIASIS   Seizures (HCC)  Long Term Goal(s): Improvement in symptoms so as ready for discharge Improvement in symptoms so as ready for discharge   Short Term  Goals: Ability to verbalize feelings will improve Ability to disclose and discuss suicidal ideas Ability to demonstrate self-control will improve Ability to maintain clinical measurements within normal limits will improve Compliance with prescribed medications will improve Ability to identify triggers associated with substance abuse/mental health issues will improve     Medication Management: Evaluate patient's response, side effects, and tolerance of medication regimen.  Therapeutic Interventions: 1 to 1 sessions, Unit Group sessions and Medication administration.  Evaluation of Outcomes: Progressing   RN Treatment Plan for Primary Diagnosis: Schizoaffective disorder, bipolar type (HCC) Long Term Goal(s): Knowledge of disease and therapeutic regimen to maintain health will improve  Short Term Goals: Ability to participate in decision making will improve, Ability to disclose and discuss  suicidal ideas, Ability to identify and develop effective coping behaviors will improve and Compliance with prescribed medications will improve  Medication Management: RN will administer medications as ordered by provider, will assess and evaluate patient's response and provide education to patient for prescribed medication. RN will report any adverse and/or side effects to prescribing provider.  Therapeutic Interventions: 1 on 1 counseling sessions, Psychoeducation, Medication administration, Evaluate responses to treatment, Monitor vital signs and CBGs as ordered, Perform/monitor CIWA, COWS, AIMS and Fall Risk screenings as ordered, Perform wound care treatments as ordered.  Evaluation of Outcomes: Not Progressing   LCSW Treatment Plan for Primary Diagnosis: Schizoaffective disorder, bipolar type (HCC) Long Term Goal(s): Safe transition to appropriate next level of care at discharge, Engage patient in therapeutic group addressing interpersonal concerns.  Short Term Goals: Engage patient in aftercare  planning with referrals and resources and Increase skills for wellness and recovery  Therapeutic Interventions: Assess for all discharge needs, 1 to 1 time with Social worker, Explore available resources and support systems, Assess for adequacy in community support network, Educate family and significant other(s) on suicide prevention, Complete Psychosocial Assessment, Interpersonal group therapy.  Evaluation of Outcomes: Not Progressing   Progress in Treatment: Attending groups: No. Participating in groups: No. Taking medication as prescribed: Yes. Toleration medication: Yes. Family/Significant other contact made: No, will contact:  CSW will contact pt's guardian. Patient understands diagnosis: Yes. Discussing patient identified problems/goals with staff: No. Medical problems stabilized or resolved: Yes. Denies suicidal/homicidal ideation: Yes. Issues/concerns per patient self-inventory: No. Other: n/a  New problem(s) identified: No, Describe:  No new problems identified  New Short Term/Long Term Goal(s):  Patient Goals:  Pt was unable to participate in treatment team at this time.  Discharge Plan or Barriers: Tentative plan for pt to return to the [facility] with outpatient services in place.  Reason for Continuation of Hospitalization: Anxiety Depression Medication stabilization Other; describe ECT  Estimated Length of Stay: 5-7 days    Attendees: Patient: 10/09/2018 4:24 PM  Physician: [name], MD 10/09/2018 4:24 PM  Nursing:  10/09/2018 4:24 PM  RN Care Manager: 10/09/2018 4:24 PM  Social Worker: Huey Romans, LCSW 10/09/2018 4:24 PM  Recreational Therapist:  10/09/2018 4:24 PM  Other:  10/09/2018 4:24 PM  Other:  10/09/2018 4:24 PM  Other: 10/09/2018 4:24 PM    Scribe for Treatment Team: Alease Frame, LCSW 10/09/2018 4:24 PM
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
## 2018-10-09 NOTE — Procedures (Signed)
ECT SERVICES Physician's Interval Evaluation & Treatment Note  Patient Identification: Jason Patterson MRN:  [id number] Date of Evaluation:  10/09/2018 TX #: 1  MADRS:   MMSE:   P.E. Findings:  Thin with some rash on his face but otherwise unremarkable  Psychiatric Interval Note:  Depressed sad down and withdrawn suicidal ideation  Subjective:  Patient is a 62 y.o. male seen for evaluation for Electroconvulsive Therapy. Feeling paranoid and suicidal  Treatment Summary:   [x]   Right Unilateral             []  Bilateral   % Energy : 0.3 ms 100%   Impedance: 1550 ohms  Seizure Energy Index: 6178 V squared  Postictal Suppression Index: 88%  Seizure Concordance Index: 95%  Medications  Pre Shock: Robinul 0.1 mg Toradol 30 mg Brevital 70 mg succinylcholine 90 mg  Post Shock:    Seizure Duration: 33 seconds by EMG 61 seconds by EEG   Comments: No complications at this point anticipate continued treatment with similar parameters been on Monday  Lungs:  [x]   Clear to auscultation               []  Other:   Heart:    [x]   Regular rhythm             []  irregular rhythm    [x]   Previous H&P reviewed, patient examined and there are NO CHANGES                 []   Previous H&P reviewed, patient examined and there are changes noted.   Jason RasmussenJohn Claudina Oliphant, MD 11/22/201910:36 AM

## 2018-10-10 NOTE — Progress Notes (Signed)
D: Pt denies SI/HI/AVH, contracts with this Clinical research associatewriter verbally for safety. Pt. Reports depression and anxiety are, "high". Pt. Asked if he would like PRN medication for reported anxiety, but declines, only wants sleep-aid and pain medication. PRNs given with good effect.  Pt. reporting some hip discomfort this evening. Pt. Encouraged to get up out of bed and move around as he is isolative and withdrawn to his room the entire shift. Participation in snacks and unit procedures absent. Pt. Denies S/S of ect. Pt. Can be demanding of staff for things he wants, but redirectable.   A: Q x 15 minute observation checks were completed for safety. Patient was provided with education, but needs reinforcement.  Patient was given/offered medications per orders. Patient  was encourage to attend groups, participate in unit activities and continue with plan of care. Pt. Chart and plans of care reviewed. Pt. Given support and encouragement.   R: Patient is complaint with medication with direction and encouragement from staff.             Precautionary checks every 15 minutes for safety maintained, room free of safety hazards, patient sustains no injury or falls during this shift. Will endorse care to next shift.

## 2018-10-10 NOTE — Plan of Care (Addendum)
Patient is alert to self, disorganized to situations. Patient denies HI and AVH, but remains very anxious with passive SI. Patient is very vigilant at the nurses station, has mild confusion has to be redirected to room. Patient will ask the same question 5 times within 10 minutes.  Patient seems to be preoccupied with certain staff members and water consumption. Patient does not exhibit any self harming behaviors. Patient takes medications cooperatively. 15 minute safety checks to continue.     Problem: Coping: Goal: Coping ability will improve Outcome: Not Progressing Goal: Will verbalize feelings Outcome: Not Progressing   Problem: Education: Goal: Knowledge of [place] [facility] Education information/materials will improve Outcome: Not Progressing Goal: Emotional status will improve Outcome: Not Progressing Goal: Mental status will improve Outcome: Not Progressing Goal: Verbalization of understanding the information provided will improve Outcome: Not Progressing   Problem: Education: Goal: Knowledge of General Education information will improve Description Including pain rating scale, medication(s)/side effects and non-pharmacologic comfort measures Outcome: Not Progressing

## 2018-10-10 NOTE — Plan of Care (Signed)
Pt. Is complaint with medications. Pt. Denies si/hi, verbally is able to contract for safety. Pt. Is able to verbalize how he is feeling during assessments, but continues to report high depression and anxiety. Pt. Coping not improving. Pt. Needs reinforcement on provided education.    Problem: Self-Concept: Goal: Will verbalize positive feelings about self Outcome: Not Progressing   Problem: Coping: Goal: Coping ability will improve Outcome: Not Progressing   Problem: Education: Goal: Knowledge of [place] [facility] Education information/materials will improve Outcome: Not Progressing   Problem: Medication: Goal: Compliance with prescribed medication regimen will improve 10/10/2018 0029 by Lenox PondsStevens, Peder Allums J, RN Outcome: Progressing 10/10/2018 0026 by Lenox PondsStevens, Keyatta Tolles J, RN Outcome: Progressing   Problem: Self-Concept: Goal: Ability to disclose and discuss suicidal ideas will improve 10/10/2018 0029 by Lenox PondsStevens, Silvester Reierson J, RN Outcome: Progressing 10/10/2018 0026 by Lenox PondsStevens, Culley Hedeen J, RN Outcome: Progressing   Problem: Coping: Goal: Will verbalize feelings Outcome: Progressing

## 2018-10-10 NOTE — [facility] Group Notes (Signed)
LCSW Group Therapy Note  10/10/2018 1:15pm  Type of Therapy and Topic:  Group Therapy:  Healthy Self Image and Positive Change  Participation Level:  Did Not Attend   Description of Group:  In this group, patients will compare and contrast their current "I am...." statements to the visions they identify as desirable for their lives.  Patients discuss fears and how they can make positive changes in their cognitions that will positively impact their behaviors.  Facilitator played a motivational 3-minute speech and patients were left with the task of thinking about what "I am...." statements they can start using in their lives immediately.  Therapeutic Goals: 1. Patient will state their current self-perception as expressed in an "I Am" statement 2. Patient will contrast this with their desired vision for their live 3. Patient will identify 3 fears that negatively impact their behavior 4. Patient will discuss cognitive distortions that stem from their fears 5. Patient will verbalize statements that challenge their cognitive distortions  Summary of Patient Progress:  Pt was invited to attend group but chose not to attend. CSW will continue to encourage pt to attend group throughout their admission.    Therapeutic Modalities Cognitive Behavioral Therapy Motivational Interviewing  Stefon Ramthun  CUEBAS-COLON, LCSW 10/10/2018 12:06 PM
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
## 2018-10-10 NOTE — [facility] Group Notes (Signed)
LCSW Group Therapy Note  10/10/2018 1:15pm  Type of Therapy and Topic:  Group Therapy:  Healthy Self Image and Positive Change  Participation Level:  Did Not Attend   Description of Group:  In this group, patients will compare and contrast their current "I am...." statements to the visions they identify as desirable for their lives.  Patients discuss fears and how they can make positive changes in their cognitions that will positively impact their behaviors.  Facilitator played a motivational 3-minute speech and patients were left with the task of thinking about what "I am...." statements they can start using in their lives immediately.  Therapeutic Goals: 1. Patient will state their current self-perception as expressed in an "I Am" statement 2. Patient will contrast this with their desired vision for their live 3. Patient will identify 3 fears that negatively impact their behavior 4. Patient will discuss cognitive distortions that stem from their fears 5. Patient will verbalize statements that challenge their cognitive distortions  Summary of Patient Progress:  Pt was invited to attend group but chose not to attend. CSW will continue to encourage pt to attend group throughout their admission.     Therapeutic Modalities Cognitive Behavioral Therapy Motivational Interviewing  Dandrea Widdowson  CUEBAS-COLON, LCSW 10/10/2018 2:54 PM

## 2018-10-11 ENCOUNTER — Other Ambulatory Visit: Payer: Self-pay | Admitting: Psychiatry

## 2018-10-11 NOTE — Progress Notes (Signed)
D: Pt denies SI/HI/AVH, verbally will contract for safety. Pt. Continues to report high depression and anxiety during assessments. Pt. Spends a majority of the shift isolative and withdrawn to his room, not socializing almost at all. Pt. Can be demanding at times and a bit bizarre, but mostly redirectable. Affect flat. Pt. Encouraged to attend to hygiene. Pt. Presentation is disheveled.   A: Q x 15 minute observation checks were completed for safety. Patient was provided with education, but needs reinforcement.  Patient was given/offered medications per orders. Patient  was encourage to attend groups, participate in unit activities and continue with plan of care. Pt. Chart and plans of care reviewed. Pt. Given support and encouragement.   R: Patient is complaint with medication with direction and encouragement. Pt. Does not attend groups, but eats a large snack in his room. Pt. Appears to be eating and drinking appropriately, occasionally up throughout the night getting water.               Precautionary checks every 15 minutes for safety maintained, room free of safety hazards, patient sustains no injury or falls during this shift. Will endorse care to next shift.
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
## 2018-10-11 NOTE — Plan of Care (Signed)
Pt. Is complaint with medications. Pt. Denies si/hi/avh, able to contract for safety. Pt. Needs reinforcement on provided education. Pt. Continues to isolate and report high depression and anxiety.   Problem: Education: Goal: Knowledge of [facility] Education information/materials will improve Outcome: Not Progressing Goal: Emotional status will improve Outcome: Not Progressing Goal: Mental status will improve Outcome: Not Progressing   Problem: Medication: Goal: Compliance with prescribed medication regimen will improve Outcome: Progressing   Problem: Self-Concept: Goal: Ability to disclose and discuss suicidal ideas will improve Outcome: Progressing

## 2018-10-11 NOTE — Progress Notes (Signed)
D: Patient stated slept good last night .Stated appetite is good patient refused  Breakfast this am . and energy level   normal. Voice of no concerns  With concentration  . Stated he was Depression Denies suicidal  homicidal ideations  .  No auditory hallucinations  No pain concerns . Inappropriate ADL'S. No  Interacting with peers and staff. Patient  compliant  with  medication given to patient . Marland Kitchen.  Limited cognitive  ability . Patient able to receive direction  staff . Patient can identify feeling . Patient receiving information on [facility] , information given in concrete form for better understanding.  Emotional and mental status improving   A: Encourage patient participation with unit programming . Instruction  Given on  Medication , verbalize understanding.  R: Voice no other concerns. Staff continue to monitor
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
## 2018-10-11 NOTE — BHH Group Notes (Signed)
LCSW Group Therapy Note 10/11/2018 1:15pm  Type of Therapy and Topic: Group Therapy: Feelings Around Returning Home & Establishing a Supportive Framework and Supporting Oneself When Supports Not Available  Participation Level: Did Not Attend  Description of Group:  Patients first processed thoughts and feelings about upcoming discharge. These included fears of upcoming changes, lack of change, new living environments, judgements and expectations from others and overall stigma of mental health issues. The group then discussed the definition of a supportive framework, what that looks and feels like, and how do to discern it from an unhealthy non-supportive network. The group identified different types of supports as well as what to do when your family/friends are less than helpful or unavailable  Therapeutic Goals  1. Patient will identify one healthy supportive network that they can use at discharge. 2. Patient will identify one factor of a supportive framework and how to tell it from an unhealthy network. 3. Patient able to identify one coping skill to use when they do not have positive supports from others. 4. Patient will demonstrate ability to communicate their needs through discussion and/or role plays.  Summary of Patient Progress:  Pt was invited to attend group but chose not to attend. CSW will continue to encourage pt to attend group throughout their admission.   Therapeutic Modalities Cognitive Behavioral Therapy Motivational Interviewing   Shante Archambeault  CUEBAS-COLON, LCSW 10/11/2018 11:59 AM  

## 2018-10-11 NOTE — Plan of Care (Signed)
Patient  compliant  with  medication given to patient . Denies suicidal  ideation .  Limited cognitive  ability . Patient able to receive direction  staff . Patient can identify feeling . Patient receiving information on [facility] , information given in concrete form for better understanding.  Emotional and mental status improving   Problem: Medication: Goal: Compliance with prescribed medication regimen will improve Outcome: Progressing   Problem: Self-Concept: Goal: Ability to disclose and discuss suicidal ideas will improve Outcome: Progressing   Problem: Coping: Goal: Coping ability will improve Outcome: Progressing Goal: Will verbalize feelings Outcome: Progressing   Problem: Education: Goal: Knowledge of [facility] Education information/materials will improve Outcome: Progressing Goal: Emotional status will improve Outcome: Progressing Goal: Mental status will improve Outcome: Progressing   Problem: Education: Goal: Knowledge of General Education information will improve Description Including pain rating scale, medication(s)/side effects and non-pharmacologic comfort measures Outcome: Progressing

## 2018-10-12 ENCOUNTER — Inpatient Hospital Stay: Payer: Medicaid Other | Admitting: Anesthesiology

## 2018-10-12 ENCOUNTER — Inpatient Hospital Stay: Payer: Medicaid Other

## 2018-10-12 LAB — GLUCOSE, CAPILLARY: GLUCOSE-CAPILLARY: 106 mg/dL — AB (ref 70–99)

## 2018-10-12 MED ORDER — SUCCINYLCHOLINE CHLORIDE 200 MG/10ML IV SOSY
PREFILLED_SYRINGE | INTRAVENOUS | Status: DC | PRN
  Administered 2018-10-12: 100 mg via INTRAVENOUS

## 2018-10-12 MED ORDER — GLYCOPYRROLATE 0.2 MG/ML IJ SOLN
INTRAMUSCULAR | Status: AC
  Administered 2018-10-12: 0.1 mg via INTRAVENOUS
  Filled 2018-10-12: qty 1

## 2018-10-12 MED ORDER — SUCCINYLCHOLINE CHLORIDE 20 MG/ML IJ SOLN
INTRAMUSCULAR | Status: AC
  Filled 2018-10-12: qty 1

## 2018-10-12 MED ORDER — ONDANSETRON HCL 4 MG/2ML IJ SOLN: 4.0000 mg | Freq: Once | INTRAMUSCULAR | Status: DC | PRN

## 2018-10-12 MED ORDER — KETOROLAC TROMETHAMINE 30 MG/ML IJ SOLN
30.0000 mg | Freq: Once | INTRAMUSCULAR | Status: AC
  Administered 2018-10-12: 30 mg via INTRAVENOUS

## 2018-10-12 MED ORDER — SODIUM CHLORIDE 0.9 % IV SOLN
500.0000 mL | Freq: Once | INTRAVENOUS | Status: AC
  Administered 2018-10-12: 500 mL via INTRAVENOUS

## 2018-10-12 MED ORDER — GLYCOPYRROLATE 0.2 MG/ML IJ SOLN
0.1000 mg | Freq: Once | INTRAMUSCULAR | Status: AC
  Administered 2018-10-12: 0.1 mg via INTRAVENOUS

## 2018-10-12 MED ORDER — KETOROLAC TROMETHAMINE 30 MG/ML IJ SOLN
INTRAMUSCULAR | Status: AC
  Administered 2018-10-12: 30 mg via INTRAVENOUS
  Filled 2018-10-12: qty 1

## 2018-10-12 MED ORDER — METHOHEXITAL SODIUM 100 MG/10ML IV SOSY
PREFILLED_SYRINGE | INTRAVENOUS | Status: DC | PRN
  Administered 2018-10-12: 70 mg via INTRAVENOUS

## 2018-10-12 MED ORDER — SODIUM CHLORIDE 0.9 % IV SOLN
INTRAVENOUS | Status: DC | PRN
  Administered 2018-10-12: 10:00:00 via INTRAVENOUS

## 2018-10-12 NOTE — Progress Notes (Signed)
Patient is alert and oriented x 4, he denies SI/HI/AVH, affect is blunted, mood is irritable, he stated to writer" I want to be left alone l am sleeping " writer retrieved from room , patient later stepped out for snacks. Patient is not interacting appropriately with peers and staff, isolates to self no distress noted, NPO status after midnight for ECT tomorrow, will continue to monitor.
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
## 2018-10-12 NOTE — Anesthesia Post-op Follow-up Note (Signed)
Anesthesia QCDR form completed.        

## 2018-10-12 NOTE — Plan of Care (Signed)
Patient alert and oriented to self, place and situation. Patient visits the nurses station frequently with the same questions, "Can I have two drinks with my meals or, Can I have an Ensure? Patient present in the milieu after ECT treatment today. Procedure went well, patient denied pain upon return to the unit. A&O x 4. Meals eaten in the dayroom and patient observed outside dancing this afternoon during recreation. Patient denies SI/HI at this time. Milieu remains safe with q 15 minute safety checks. Will continue to monitor.

## 2018-10-12 NOTE — Plan of Care (Signed)
  Problem: Medication: Goal: Compliance with prescribed medication regimen will improve Outcome: Progressing  Compliant with medication administration

## 2018-10-12 NOTE — Progress Notes (Signed)
Recreation Therapy Notes  INPATIENT RECREATION THERAPY ASSESSMENT  Patient Details Name: Hector ShadeGordon Lee Laday MRN: [id number] DOB: Dec 17, 1955 Today's Date: 10/12/2018       Information Obtained From: Chart Review  Able to Participate in Assessment/Interview:    Patient Presentation: Confused, Disheveled, Responsive  Reason for Admission (Per Patient):    Patient Stressors:    Coping Skills:   Read  Leisure Interests (2+):  Art - Draw, Art - Coloring  Frequency of Recreation/Participation:    Awareness of Community Resources:     [employer] Resources:     Current Use:    If no, Barriers?:    Expressed Interest in [employer] CorporationCommunity Resource Information:    [place]  Patient Main Form of Transportation:    Patient Strengths:  N/A  Patient Identified Areas of Improvement:  N/A  Patient Goal for Hospitalization:     Current SI (including self-harm):  No  Current HI:  No  Current AVH: No  Staff Intervention Plan: Group Attendance, Collaborate with Interdisciplinary Treatment Team  Consent to Intern Participation: N/A  Cheila Wickstrom 10/12/2018, 1:59 PM

## 2018-10-12 NOTE — Anesthesia Procedure Notes (Signed)
Date/Time: 10/12/2018 10:30 AM Performed by: Lily KocherPeralta, Raphael Fitzpatrick, CRNA Pre-anesthesia Checklist: Patient identified, Emergency Drugs available, Suction available and Patient being monitored Patient Re-evaluated:Patient Re-evaluated prior to induction Oxygen Delivery Method: Circle system utilized Preoxygenation: Pre-oxygenation with 100% oxygen Induction Type: IV induction Ventilation: Mask ventilation without difficulty and Mask ventilation throughout procedure Airway Equipment and Method: Bite block Placement Confirmation: positive ETCO2 Dental Injury: Teeth and Oropharynx as per pre-operative assessment

## 2018-10-12 NOTE — Transfer of Care (Signed)
Immediate Anesthesia Transfer of Care Note  Patient: Jason Patterson  Procedure(s) Performed: ECT TX  Patient Location: PACU  Anesthesia Type:General  Level of Consciousness: sedated  Airway & Oxygen Therapy: Patient Spontanous Breathing and Patient connected to face mask oxygen  Post-op Assessment: Report given to RN and Post -op Vital signs reviewed and stable  Post vital signs: Reviewed and stable  Last Vitals:  Vitals Value Taken Time  BP 137/85 10/12/2018 10:39 AM  Temp    Pulse 77 10/12/2018 10:40 AM  Resp 29 10/12/2018 10:40 AM  SpO2 100 % 10/12/2018 10:40 AM  Vitals shown include unvalidated device data.  Last Pain:  Vitals:   10/12/18 0935  TempSrc: Oral  PainSc: 0-No pain      Patients Stated Pain Goal: 0 (10/09/18 2152)  Complications: No apparent anesthesia complications
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
## 2018-10-12 NOTE — Anesthesia Postprocedure Evaluation (Signed)
Anesthesia Post Note  Patient: Jason Patterson  Procedure(s) Performed: ECT TX  Patient location during evaluation: PACU Anesthesia Type: General Level of consciousness: sedated Pain management: pain level controlled Vital Signs Assessment: post-procedure vital signs reviewed and stable Respiratory status: spontaneous breathing and respiratory function stable Cardiovascular status: blood pressure returned to baseline and stable Anesthetic complications: no     Last Vitals:  Vitals:   10/12/18 1059 10/12/18 1112  BP: (!) 122/94 125/82  Pulse: 83 72  Resp: (!) 21   Temp:    SpO2: 96% 96%    Last Pain:  Vitals:   10/12/18 1112  TempSrc:   PainSc: 0-No pain                 Jalissa Heinzelman K

## 2018-10-12 NOTE — Procedures (Signed)
ECT SERVICES Physician's Interval Evaluation & Treatment Note  Patient Identification: Jason Patterson MRN:  [id number] Date of Evaluation:  10/12/2018 TX #: 2  MADRS:   MMSE:   P.E. Findings:  Patient remains withdrawn down looks like he is lost weight.  Poor hygiene.  Psychiatric Interval Note:  Depressed mood passive suicidal thought.  Paranoia.  Subjective:  Patient is a 62 y.o. male seen for evaluation for Electroconvulsive Therapy. Not feeling depressed paranoid and hopeless  Treatment Summary:   [x]   Right Unilateral             []  Bilateral   % Energy : 0.3 ms 100%   Impedance: 2760 ohms  Seizure Energy Index: 3335 V squared  Postictal Suppression Index: 71%  Seizure Concordance Index: 89%  Medications  Pre Shock: Robinul 0.1 mg Toradol 30 mg Brevital 70 mg succinylcholine 100 mg  Post Shock:    Seizure Duration: 18 seconds EMG 45 seconds EEG   Comments: Follow-up in a week because of the holiday  Lungs:  [x]   Clear to auscultation               []  Other:   Heart:    [x]   Regular rhythm             []  irregular rhythm    [x]   Previous H&P reviewed, patient examined and there are NO CHANGES                 []   Previous H&P reviewed, patient examined and there are changes noted.   [name] , MD 11/25/201910:16 AM

## 2018-10-12 NOTE — Progress Notes (Signed)
Patient returned to unit after having ECT procedure done. Patient alert and oriented x 4. Ambulatory with a steady gait. No complaints of pain nor discomfort at this time.  Food and po fluids provided to patient during lunch time. Ate meal without difficulty, after meal pt observed resting in bed with eyes closed. Will continue to monitor.

## 2018-10-12 NOTE — Progress Notes (Signed)
Recreation Therapy Notes          Felisia Balcom 10/12/2018 11:38 AM
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
## 2018-10-12 NOTE — [facility] Group Notes (Signed)
LCSW Group Therapy Note   10/12/2018 1:15pm   Type of Therapy and Topic:  Group Therapy:  Overcoming Obstacles   Participation Level:  Did Not Attend   Description of Group:    In this group patients will be encouraged to explore what they see as obstacles to their own wellness and recovery. They will be guided to discuss their thoughts, feelings, and behaviors related to these obstacles. The group will process together ways to cope with barriers, with attention given to specific choices patients can make. Each patient will be challenged to identify changes they are motivated to make in order to overcome their obstacles. This group will be process-oriented, with patients participating in exploration of their own experiences as well as giving and receiving support and challenge from other group members.   Therapeutic Goals: 1. Patient will identify personal and current obstacles as they relate to admission. 2. Patient will identify barriers that currently interfere with their wellness or overcoming obstacles.  3. Patient will identify feelings, thought process and behaviors related to these barriers. 4. Patient will identify two changes they are willing to make to overcome these obstacles:      Summary of Patient Progress      Therapeutic Modalities:   Cognitive Behavioral Therapy Solution Focused Therapy Motivational Interviewing Relapse Prevention Therapy  Jason RogueRodney B Linette Gunderson, LCSW 10/12/2018 4:11 PM

## 2018-10-13 NOTE — [facility] Group Notes (Signed)
Feelings Around Diagnosis 10/13/2018 1PM  Type of Therapy/Topic:  Group Therapy:  Feelings about Diagnosis  Participation Level:  Minimal   Description of Group:   This group will allow patients to explore their thoughts and feelings about diagnoses they have received. Patients will be guided to explore their level of understanding and acceptance of these diagnoses. Facilitator will encourage patients to process their thoughts and feelings about the reactions of others to their diagnosis and will guide patients in identifying ways to discuss their diagnosis with significant others in their lives. This group will be process-oriented, with patients participating in exploration of their own experiences, giving and receiving support, and processing challenge from other group members.   Therapeutic Goals: 1. Patient will demonstrate understanding of diagnosis as evidenced by identifying two or more symptoms of the disorder 2. Patient will be able to express two feelings regarding the diagnosis 3. Patient will demonstrate their ability to communicate their needs through discussion and/or role play  Summary of Patient Progress: Minimal interaction in group. Patient sat quietly, did not engage much with facilitator or group members.        Therapeutic Modalities:   Cognitive Behavioral Therapy Brief Therapy Feelings Identification    Suzan SlickDARREN T Kanyon Bunn, LCSW 10/13/2018 3:43 PM
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
## 2018-10-13 NOTE — Plan of Care (Signed)
Pt is alert and oriented x3. He was met lying on the bed in his room relaxed and comfortable. Pt remains on seizure precautions per Md. Pt is calm and coherent, described having daily suicidal thoughts with no specific plan. Pt states that he has been bordered by people he is living with at his family care home. Pt's affect is normal with mild signs of depression. Mood is congruent with affect. His thinking is logical and thought process is appropriate. Pt denies homicidal ideas and AVH. The Pt's insight into his illness is fair. Pt contracts for safety verbally and agrees to tell staff of he starts feeling acutely suicidal. Q15 minute safety checks maintained.  Problem: Medication: Goal: Compliance with prescribed medication regimen will improve Outcome: Progressing   Problem: Self-Concept: Goal: Ability to disclose and discuss suicidal ideas will improve Outcome: Progressing Goal: Will verbalize positive feelings about self Outcome: Progressing   Problem: Coping: Goal: Coping ability will improve Outcome: Progressing Goal: Will verbalize feelings Outcome: Progressing   Problem: Education: Goal: Knowledge of [place] [facility] Education information/materials will improve Outcome: Progressing Goal: Emotional status will improve Outcome: Progressing Goal: Mental status will improve Outcome: Progressing Goal: Verbalization of understanding the information provided will improve Outcome: Progressing   Problem: Education: Goal: Knowledge of General Education information will improve Description Including pain rating scale, medication(s)/side effects and non-pharmacologic comfort measures Outcome: Progressing   

## 2018-10-13 NOTE — Progress Notes (Signed)
Recreation Therapy Notes  Date: 10/12/2018  Time: 9:30 am   Location: Craft room   Behavioral response: N/A   Intervention Topic: Relaxation  Discussion/Intervention: Patient did not attend group.   Clinical Observations/Feedback:  Patient did not attend group.   Sigurd Pugh LRT/CTRS        Amanda Steuart 10/13/2018 11:01 AM

## 2018-10-13 NOTE — [facility] Suicide Risk Assessment (Signed)
[facility] INPATIENT:  Family/Significant Other Suicide Prevention Education  Suicide Prevention Education:  Education Completed; See below  has been identified by the patient as the family member/significant other with whom the patient will be residing, and identified as the person(s) who will aid the patient in the event of a mental health crisis (suicidal ideations/suicide attempt).  With written consent from the patient, the family member/significant other has been provided the following suicide prevention education, prior to the and/or following the discharge of the patient.  The suicide prevention education provided includes the following:  Suicide risk factors  Suicide prevention and interventions  National Suicide Hotline telephone number  [facility] assessment telephone number  [facility] Emergency Assistance 911  [facility] and/or [facility]idential [facility] telephone number  Request made of family/significant other to:  Remove weapons (e.g., guns, rifles, knives), all items previously/currently identified as safety concern.    Remove drugs/medications (over-the-counter, prescriptions, illicit drugs), all items previously/currently identified as a safety concern.  The family member/significant other verbalizes understanding of the suicide prevention education information provided.  The family member/significant other agrees to remove the items of safety concern listed above.   Spoke with Anner CreteShiela Marshall - [phone] guardian.  She states patient works with [facility] ACT team [phone], and that they and she are working on finding new residence.  I let her know that when pt is stable he will need to d/c to previous residence if new one is not yet in place.  She states Judeth CornfieldStephanie on ACT team knows this patient well, and can coordinate d/c/transfer with us.  I sent her a ROI to sign.   Ida RogueRodney B Nyra Anspaugh 10/13/2018, 3:46 PM

## 2018-10-13 NOTE — Progress Notes (Signed)
Patient placed on seizure precaution per MD, bed in lowest position. Yellow Fall risk band on arm. No other measures can be safely put into place on this unit relating to seizure precaution. Will continue to monitor.
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
## 2018-10-13 NOTE — Plan of Care (Signed)
  Problem: Medication: Goal: Compliance with prescribed medication regimen will improve Outcome: Progressing  Patient denies suicidal ideation.
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
## 2018-10-14 LAB — GLUCOSE, CAPILLARY: GLUCOSE-CAPILLARY: 93 mg/dL (ref 70–99)

## 2018-10-14 MED ORDER — TRAZODONE HCL 100 MG PO TABS
100.0000 mg | ORAL_TABLET | Freq: Every day | ORAL | Status: DC
  Administered 2018-10-14 – 2018-10-20 (×7): 100 mg via ORAL
  Filled 2018-10-14 (×7): qty 1

## 2018-10-14 MED ORDER — LOXAPINE SUCCINATE 5 MG PO CAPS
25.0000 mg | ORAL_CAPSULE | Freq: Three times a day (TID) | ORAL | Status: DC
  Administered 2018-10-14 – 2018-10-21 (×20): 25 mg via ORAL
  Filled 2018-10-14 (×23): qty 5

## 2018-10-14 MED ORDER — MELATONIN 5 MG PO TABS
10.0000 mg | ORAL_TABLET | Freq: Every day | ORAL | Status: AC
  Administered 2018-10-14 – 2018-10-20 (×7): 10 mg via ORAL
  Filled 2018-10-14 (×8): qty 2

## 2018-10-14 MED ORDER — NON FORMULARY: 10.0000 mg | Freq: Every day | Status: DC

## 2018-10-14 NOTE — Progress Notes (Signed)
Recreation Therapy Notes  Date: 10/14/2018  Time: 9:30 am   Location: Craft room   Behavioral response: N/A   Intervention Topic: Problem Solving  Discussion/Intervention: Patient did not attend group.   Clinical Observations/Feedback:  Patient did not attend group.   Yaretzi Ernandez LRT/CTRS        Erikka Follmer 10/14/2018 11:11 AM 

## 2018-10-14 NOTE — Plan of Care (Signed)
Patient is alert and oriented X 3 with passive SI, without a plan. Patient denies HI and AVH. Patient displays some anxiety. Patient more present in milieu today, watching television, interacting more with peers. Patient verbally contracts for safety, patient is compliant with prescribed medication regimen. Patient's medications were given later due to patient was reported to have ECT procedure today, but procedure will not be taking place today. Patient is cooperative and appropriate, and denies pain. 15 minute safety checks to continue. Problem: Medication: Goal: Compliance with prescribed medication regimen will improve Outcome: Progressing   Problem: Coping: Goal: Coping ability will improve Outcome: Progressing Goal: Will verbalize feelings Outcome: Progressing   Problem: Education: Goal: Knowledge of [facility] Education information/materials will improve Outcome: Progressing Goal: Emotional status will improve Outcome: Progressing Goal: Mental status will improve Outcome: Progressing Goal: Verbalization of understanding the information provided will improve Outcome: Progressing

## 2018-10-14 NOTE — Plan of Care (Signed)
Patient was compliant with medications with this Clinical research associatewriter. Patient stated that he has suicidal thoughts still but that he doesn't want to act on them. Patient stated if they got worse he would talk to this Clinical research associatewriter or another staff member. Patient verbally contracted for safety.   Problem: Medication: Goal: Compliance with prescribed medication regimen will improve Outcome: Progressing   Problem: Self-Concept: Goal: Ability to disclose and discuss suicidal ideas will improve Outcome: Progressing

## 2018-10-14 NOTE — Tx Team (Signed)
Interdisciplinary Treatment and Diagnostic Plan Update  10/14/2018 Time of Session: 8:30 AM Jason Patterson MRN: [id number]  Principal Diagnosis: Schizoaffective disorder, bipolar type (HCC)  Secondary Diagnoses: Principal Problem:   Schizoaffective disorder, bipolar type (HCC) Active Problems:   Essential hypertension   COPD (chronic obstructive pulmonary disease) (HCC)   GERD   PSORIASIS   Seizures (HCC)   Current Medications:  Current Facility-Administered Medications  Medication Dose Route Frequency Provider Last Rate Last Dose  . acetaminophen (TYLENOL) tablet 650 mg  650 mg Oral Q6H PRN Clapacs, Jackquline DenmarkJohn T, MD   650 mg at 10/09/18 2152  . albuterol (PROVENTIL) (2.5 MG/3ML) 0.083% nebulizer solution 2.5 mg  2.5 mg Inhalation Q4H PRN Clapacs, John T, MD      . alum & mag hydroxide-simeth (MAALOX/MYLANTA) 200-200-20 MG/5ML suspension 30 mL  30 mL Oral Q4H PRN Clapacs, John T, MD      . aspirin EC tablet 81 mg  81 mg Oral Daily Clapacs, Jackquline DenmarkJohn T, MD   81 mg at 10/14/18 0959  . cholecalciferol (VITAMIN D3) tablet 2,000 Units  2,000 Units Oral Daily Clapacs, Jackquline DenmarkJohn T, MD   2,000 Units at 10/14/18 [phone]  . famotidine (PEPCID) tablet 10 mg  10 mg Oral BID Clapacs, Jackquline DenmarkJohn T, MD   10 mg at 10/14/18 0959  . feeding supplement (ENSURE ENLIVE) (ENSURE ENLIVE) liquid 237 mL  237 mL Oral TID BM Clapacs, John T, MD   237 mL at 10/14/18 1000  . hydrOXYzine (ATARAX/VISTARIL) tablet 50 mg  50 mg Oral TID PRN Clapacs, Jackquline DenmarkJohn T, MD   50 mg at 10/12/18 2140  . latanoprost (XALATAN) 0.005 % ophthalmic solution 1 drop  1 drop Both Eyes QHS Clapacs, Jackquline DenmarkJohn T, MD   1 drop at 10/13/18 2118  . levETIRAcetam (KEPPRA) tablet 1,000 mg  1,000 mg Oral BID Clapacs, Jackquline DenmarkJohn T, MD   1,000 mg at 10/14/18 0959  . loxapine (LOXITANE) capsule 25 mg  25 mg Oral TID Clapacs, John T, MD      . magnesium hydroxide (MILK OF MAGNESIA) suspension 30 mL  30 mL Oral Daily PRN Clapacs, John T, MD      . metoprolol tartrate (LOPRESSOR)  tablet 12.5 mg  12.5 mg Oral BID Clapacs, Jackquline DenmarkJohn T, MD   12.5 mg at 10/14/18 0823  . multivitamin with minerals tablet 1 tablet  1 tablet Oral Daily Clapacs, Jackquline DenmarkJohn T, MD   1 tablet at 10/14/18 1005  . nicotine (NICODERM CQ - dosed in mg/24 hours) patch 21 mg  21 mg Transdermal Daily Clapacs, Jackquline DenmarkJohn T, MD   21 mg at 10/09/18 1323  . OLANZapine (ZYPREXA) tablet 20 mg  20 mg Oral BID Clapacs, Jackquline DenmarkJohn T, MD   20 mg at 10/14/18 0959  . ondansetron (ZOFRAN) injection 4 mg  4 mg Intravenous Once PRN Naomie DeanKephart, William K, MD      . polyethylene glycol (MIRALAX / GLYCOLAX) packet 17 g  17 g Oral BID Clapacs, Jackquline DenmarkJohn T, MD   17 g at 10/14/18 1006  . senna (SENOKOT) tablet 8.6 mg  1 tablet Oral BID Clapacs, John T, MD   8.6 mg at 10/14/18 1006  . tamsulosin (FLOMAX) capsule 0.4 mg  0.4 mg Oral Daily Clapacs, John T, MD   0.4 mg at 10/14/18 1000  . traZODone (DESYREL) tablet 100 mg  100 mg Oral QHS PRN Clapacs, Jackquline DenmarkJohn T, MD   100 mg at 10/12/18 2140   PTA Medications: Medications Prior to Admission  Medication Sig  Dispense Refill Last Dose  . albuterol (PROAIR HFA) 108 (90 BASE) MCG/ACT inhaler Inhale 1 puff into the lungs 4 (four) times daily.    10/08/2018  . aspirin EC 81 MG tablet Take 81 mg by mouth daily.   10/08/2018  . b complex vitamins capsule Take 1 capsule by mouth daily.   10/08/2018  . l-methylfolate-B6-B12 (METANX) 3-35-2 MG TABS tablet Take 1 tablet by mouth daily.   10/08/2018  . latanoprost (XALATAN) 0.005 % ophthalmic solution Place 1 drop into both eyes at bedtime. 2.5 mL 0 10/08/2018  . levETIRAcetam (KEPPRA) 500 MG tablet Take 1,000 mg by mouth 2 (two) times daily.   10/08/2018  . loxapine (LOXITANE) 25 MG capsule Take 25 mg by mouth 3 (three) times daily.   10/08/2018  . NONFORMULARY OR COMPOUNDED ITEM Apply 1 application topically at bedtime. Triamcinolone cr 0.1% w/  Moisturizing cream Mix 1:1  And apply to dry irritated areas of face and legs   10/08/2018  . OLANZapine (ZYPREXA) 20 MG tablet Take  20 mg by mouth 2 (two) times daily.   10/08/2018  . polyethylene glycol (MIRALAX / GLYCOLAX) packet Take 17 g by mouth 2 (two) times daily.   10/08/2018  . ranitidine (ZANTAC) 150 MG tablet Take 150 mg by mouth 2 (two) times daily.   10/08/2018  . senna (SENOKOT) 8.6 MG TABS tablet Take 1 tablet by mouth 2 (two) times daily.   10/08/2018  . tamsulosin (FLOMAX) 0.4 MG CAPS capsule Take 0.4 mg by mouth daily.    10/08/2018  . Vitamin D, Ergocalciferol, 2000 units CAPS Take 2,000 Units by mouth daily.   10/08/2018    Patient Stressors: Financial difficulties Other: Home care staff patient staes he can't get along with staff feels mistreated and not liked by staff  Patient Strengths: Ability for insight General fund of knowledge Motivation for treatment/growth  Treatment Modalities: Medication Management, Group therapy, Case management,  1 to 1 session with clinician, Psychoeducation, Recreational therapy.   Physician Treatment Plan for Primary Diagnosis: Schizoaffective disorder, bipolar type (HCC) Long Term Goal(s): Improvement in symptoms so as ready for discharge Improvement in symptoms so as ready for discharge   Short Term Goals: Ability to verbalize feelings will improve Ability to disclose and discuss suicidal ideas Ability to demonstrate self-control will improve Ability to maintain clinical measurements within normal limits will improve Compliance with prescribed medications will improve Ability to identify triggers associated with substance abuse/mental health issues will improve  Medication Management: Evaluate patient's response, side effects, and tolerance of medication regimen.  Therapeutic Interventions: 1 to 1 sessions, Unit Group sessions and Medication administration.  Evaluation of Outcomes: Progressing  Physician Treatment Plan for Secondary Diagnosis: Principal Problem:   Schizoaffective disorder, bipolar type (HCC) Active Problems:   Essential hypertension    COPD (chronic obstructive pulmonary disease) (HCC)   GERD   PSORIASIS   Seizures (HCC)  Long Term Goal(s): Improvement in symptoms so as ready for discharge Improvement in symptoms so as ready for discharge   Short Term Goals: Ability to verbalize feelings will improve Ability to disclose and discuss suicidal ideas Ability to demonstrate self-control will improve Ability to maintain clinical measurements within normal limits will improve Compliance with prescribed medications will improve Ability to identify triggers associated with substance abuse/mental health issues will improve     Medication Management: Evaluate patient's response, side effects, and tolerance of medication regimen.  Therapeutic Interventions: 1 to 1 sessions, Unit Group sessions and Medication administration.  Evaluation  of Outcomes: Progressing   RN Treatment Plan for Primary Diagnosis: Schizoaffective disorder, bipolar type (HCC) Long Term Goal(s): Knowledge of disease and therapeutic regimen to maintain health will improve  Short Term Goals: Ability to participate in decision making will improve, Ability to disclose and discuss suicidal ideas, Ability to identify and develop effective coping behaviors will improve and Compliance with prescribed medications will improve  Medication Management: RN will administer medications as ordered by provider, will assess and evaluate patient's response and provide education to patient for prescribed medication. RN will report any adverse and/or side effects to prescribing provider.  Therapeutic Interventions: 1 on 1 counseling sessions, Psychoeducation, Medication administration, Evaluate responses to treatment, Monitor vital signs and CBGs as ordered, Perform/monitor CIWA, COWS, AIMS and Fall Risk screenings as ordered, Perform wound care treatments as ordered.  Evaluation of Outcomes: Progressing   LCSW Treatment Plan for Primary Diagnosis: Schizoaffective disorder,  bipolar type (HCC) Long Term Goal(s): Safe transition to appropriate next level of care at discharge, Engage patient in therapeutic group addressing interpersonal concerns.  Short Term Goals: Engage patient in aftercare planning with referrals and resources and Increase skills for wellness and recovery  Therapeutic Interventions: Assess for all discharge needs, 1 to 1 time with Social worker, Explore available resources and support systems, Assess for adequacy in community support network, Educate family and significant other(s) on suicide prevention, Complete Psychosocial Assessment, Interpersonal group therapy.  Evaluation of Outcomes: Progressing   Progress in Treatment: Attending groups: No. Participating in groups: No. Taking medication as prescribed: Yes. Toleration medication: Yes. Family/Significant other contact made: Yes, individual(s) contacted:  Pt's guardian has been contacted. Patient understands diagnosis: Yes. Discussing patient identified problems/goals with staff: No. Medical problems stabilized or resolved: Yes. Denies suicidal/homicidal ideation: As evidenced by:  Pt still have passive SI with no plan. Issues/concerns per patient self-inventory: No. Other: n/a  New problem(s) identified: No, Describe:  No new problems identified  New Short Term/Long Term Goal(s):  Patient Goals:  Pt was unable to participate in treatment team at this time.  Discharge Plan or Barriers: Tentative plan for pt to return to the [facility] with outpatient services in place.  Pt has made some improving in his mood/depression with ECT.    Reason for Continuation of Hospitalization: Anxiety Depression Medication stabilization Other; describe ECT  Estimated Length of Stay: 5-7 days    Attendees: Patient: 10/14/2018 1:38 PM  Physician: [name], MD 10/14/2018 1:38 PM  Nursing:  10/14/2018 1:38 PM  RN Care Manager: 10/14/2018 1:38 PM  Social Worker: Huey Romans, LCSW 10/14/2018 1:38  PM  Recreational Therapist:  10/14/2018 1:38 PM  Other:  10/14/2018 1:38 PM  Other:  10/14/2018 1:38 PM  Other: 10/14/2018 1:38 PM    Scribe for Treatment Team: Alease Frame, LCSW 10/14/2018 1:38 PM

## 2018-10-14 NOTE — Progress Notes (Signed)
D - Patient was in his room upon arrival to the unit. Patient was pleasant during assessment and medication administration. Patient denies HI/AVH but did say he has SI without a specific plan. When assessed about his SI the patient stated that, "I don't want to act on my thoughts, but I just have them sometimes. If they get worse I will come talk to you or a staff member." Patient verbally contracts for safety with this Clinical research associatewriter.   A - Patient was compliant with medication administration per MD orders and procedures on the unit. Patient got up for snack and to take medications. Patient was calm and cooperative with this Clinical research associatewriter. Patient is here for ECT but they are closed until December 2nd. Patient given education. Patient given support and encouragement.   R - Patient verbally contracts for safety with this Clinical research associatewriter. Patient being monitored Q 15 minutes per unit protocol. Patient told to inform staff if there are any issues or problems. Patient remains safe on the unit at this time.

## 2018-10-15 NOTE — Plan of Care (Signed)
Patient was anxious upon arrival to the unit. Patient kept coming up to the nurses station asking for his sleeping medications. Patient was given education on when he could have his medications. The patient still continued to come up asking about when he could take his sleeping medications. Patient is compliant with medications when they are due per MD orders.   Problem: Medication: Goal: Compliance with prescribed medication regimen will improve Outcome: Progressing   Problem: Education: Goal: Emotional status will improve Outcome: Not Progressing
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
## 2018-10-15 NOTE — Plan of Care (Signed)
Patient is more visible today at the nurses station than yesterday. Patient is awaiting ECT treatments to continue next week. Patient still has passive SI, denies HI, Auditory and visual hallucinations. Problem: Medication: Goal: Compliance with prescribed medication regimen will improve Outcome: Progressing   Problem: Coping: Goal: Coping ability will improve Outcome: Progressing Goal: Will verbalize feelings Outcome: Progressing   Problem: Education: Goal: Knowledge of [facility] Education information/materials will improve Outcome: Progressing Goal: Emotional status will improve Outcome: Not Progressing Goal: Mental status will improve Outcome: Not Progressing Goal: Verbalization of understanding the information provided will improve Outcome: Progressing   Problem: Education: Goal: Knowledge of General Education information will improve Description Including pain rating scale, medication(s)/side effects and non-pharmacologic comfort measures Outcome: Progressing

## 2018-10-15 NOTE — Progress Notes (Signed)
D - Patient was out in the milieu upon arrival to the unit. The patient came up to the nurses station 4 times in 30 minutes, always asking about his night time medications. When assessed the patient stated, "I am ready to go to sleep, I won't ever make it until 9 pm." Patient was given education about medication administration. Patient was pleasant during assessment. Patient denies HI/AVH, pain, anxiety and depression. Patient stated he still has some thoughts of suicide but said he wouldn't act on them and verbally contracted with this Clinical research associatewriter.   A - Patient was compliant with medication administration per MD orders and procedures on the unit. Patient didn't get up for snack saying, "I don't want it tonight." Patient was cooperative with this Clinical research associatewriter during medication administration. Patient is here for ECT but they are closed until December 2nd. Patient given education. Patient given support and encouragement.   R - Patient verbally contracts for safety with this Clinical research associatewriter. Patient being monitored Q 15 minutes for safety per unit protocol. Patient told to inform staff if there are any issues or problems. Patient remains safe on the unit at this time.

## 2018-10-15 NOTE — Progress Notes (Signed)
Patient is alert to self, place and situation, disoriented to time. Patient complains of nausea and anxiety this morning received maylanta and vistaril. Patient continues to pace up and down the hallway, frequent contact with staff and nurse. Patient denies HI, AVH but has passive SI thoughts without a plan. Patient contracts for safety verbally. Safety rounding Q 15 minutes to continue.

## 2018-10-16 MED ORDER — ONDANSETRON 4 MG PO TBDP
4.0000 mg | ORAL_TABLET | Freq: Three times a day (TID) | ORAL | Status: DC | PRN
  Administered 2018-10-16: 4 mg via ORAL
  Filled 2018-10-16: qty 1

## 2018-10-16 NOTE — Progress Notes (Signed)
Recreation Therapy Notes  Date: 10/16/2018  Time: 9:30 am   Location: Craft room   Behavioral response: N/A   Intervention Topic: Time Management  Discussion/Intervention: Patient did not attend group.   Clinical Observations/Feedback:  Patient did not attend group.   Stetson Pelaez LRT/CTRS         Cathern Tahir 10/16/2018 10:16 AM

## 2018-10-16 NOTE — Plan of Care (Signed)
D: Pt denies SI/HI/AV hallucinations. Pt is irritable mood today. Patient has been in milieu today, he does not interact with peers. Patient was in bed most of day. Patient is cooperative.  A: Pt was offered support and encouragement. Pt was given scheduled medications. Pt was encouraged to attend groups. Q 15 minute checks were done for safety.  R: Pt is taking medication. Pt has no complaints.Pt receptive to treatment and safety maintained on unit.    Problem: Medication: Goal: Compliance with prescribed medication regimen will improve Outcome: Progressing   Problem: Self-Concept: Goal: Ability to disclose and discuss suicidal ideas will improve Outcome: Progressing Goal: Will verbalize positive feelings about self Outcome: Progressing   Problem: Coping: Goal: Coping ability will improve Outcome: Progressing Goal: Will verbalize feelings Outcome: Progressing   Problem: Education: Goal: Emotional status will improve Outcome: Progressing
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
## 2018-10-16 NOTE — [facility] Group Notes (Signed)
[facility] LCSW Group Therapy Note  Date/Time: 10/16/18, 1215  Type of Therapy/Topic:  Group Therapy:  Balance in Life  Participation Level:  Did not attend  Description of Group:    This group will address the concept of balance and how it feels and looks when one is unbalanced. Patients will be encouraged to process areas in their lives that are out of balance, and identify reasons for remaining unbalanced. Facilitators will guide patients utilizing problem- solving interventions to address and correct the stressor making their life unbalanced. Understanding and applying boundaries will be explored and addressed for obtaining  and maintaining a balanced life. Patients will be encouraged to explore ways to assertively make their unbalanced needs known to significant others in their lives, using other group members and facilitator for support and feedback.  Therapeutic Goals: 1. Patient will identify two or more emotions or situations they have that consume much of in their lives. 2. Patient will identify signs/triggers that life has become out of balance:  3. Patient will identify two ways to set boundaries in order to achieve balance in their lives:  4. Patient will demonstrate ability to communicate their needs through discussion and/or role plays  Summary of Patient Progress:          Therapeutic Modalities:   Cognitive Behavioral Therapy Solution-Focused Therapy Assertiveness Training  Greg Estell Puccini, LCSW 
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
## 2018-10-17 NOTE — Progress Notes (Signed)
DAR Note: Pt A & O to self, place and events. Presents restless / fidgety and irritable this AM on initial approach. Pt observed to be paranoid as well "I don't want to eat, I don't want no medicines, y'all trying to kill me in here". Pt was selectively compliant with medications when offered; declined Miralax and flomax "I don't want those two, they make me shit and pee a lot, I don't want them". Reports he slept "fairly well" last night but his appetite has been poor. Pt only drank his fluids off his breakfast tray, ate approximately 30% of lunch and 80% of dinner. Refused to shower despite multiple attempts and assistance by writer "no, I will wait for the tech lady in the morning, she help me bath yesterday and she told me she will be here on Sunday morning to help me bath". Support offered to pt throughout this shift. Encouraged pt to voice concerns, attend to ADLs and comply with current treatment regimen. Q 15 minutes safety checks maintained without self harm gestures or falls to note at this time. Scheduled medications given per MD's orders with verbal education and effects monitored. Pt did not attend groups as scheduled despite multiple verbal prompts. Tolerates all PO fluids intake well. Remains safe on unit.

## 2018-10-17 NOTE — Progress Notes (Signed)
Patient was cooperative with treatment, he came to medication room for medications, he remains anxious on approach. He was visible in the milieu. He appears to be in bed resting quietly at this time.

## 2018-10-17 NOTE — [facility] Group Notes (Signed)
  Jason Patterson, Jason Patterson 10/17/2018, 1:43 PM LCSW Group Therapy Note  Saturday November 1st 12:30-1:15pm   Type of Therapy/Topic:  Group Therapy:  Feelings on Holding Grudges  Participation Level:  Patient did not attend- resting in room  Description of Group:   This group will allow patients to explore their thoughts and feelings about grudges.Facilitator will encourage patients to process their thoughts and feelings about the reactions of others while dealing with grudges and will guide patients in identifying ways to discuss healing after letting grudges go.This group will be process-oriented, with patients participating in exploration of their own experiences, giving and receiving support, and processing challenge from other group members.   Therapeutic Goals: 1. Patient will identify a grudge they have been dealing with in their personal life 2. Patient will be able to express feelings regarding the grudges they have and how they felt after letting go of a grudge  3. Patient will demonstrate their ability of how to let go of a grudge what worked 4. Patient will identify situations where they have let go of a grudge but did not releaese it totally.  Summary of Patient Progress:    Therapeutic Modalities:   Cognitive Behavioral Therapy Brief Therapy Feelings Identification

## 2018-10-17 NOTE — Plan of Care (Signed)
Patient is acting confused, irritable , distracted and anxious, depressed mood and affect is congruent with his mood, endorsing  suicide thoughts, states, I think about suicide all the time, did not say what triggers his thoughts, insights into illness is fair and judgement is poor, encouragement patient to participate in activities with peers in the common area. Patient denies AVH,  no bizarre behaviors,  patient is compliant with his medications.  Every 15 minute observation checks is maintained for safety, patient is provided with education , encouraged to attend groups and participate in unit activities and continue plan of care, no behavioral concerns at this time no distress. Problem: Medication: Goal: Compliance with prescribed medication regimen will improve Outcome: Progressing   Problem: Self-Concept: Goal: Ability to disclose and discuss suicidal ideas will improve Outcome: Progressing Goal: Will verbalize positive feelings about self Outcome: Progressing   Problem: Coping: Goal: Coping ability will improve Outcome: Progressing Goal: Will verbalize feelings Outcome: Progressing   Problem: Education: Goal: Knowledge of [facility] Education information/materials will improve Outcome: Progressing Goal: Emotional status will improve Outcome: Progressing Goal: Mental status will improve Outcome: Progressing Goal: Verbalization of understanding the information provided will improve Outcome: Progressing   Problem: Education: Goal: Knowledge of General Education information will improve Description Including pain rating scale, medication(s)/side effects and non-pharmacologic comfort measures Outcome: Progressing

## 2018-10-18 ENCOUNTER — Other Ambulatory Visit: Payer: Self-pay | Admitting: Psychiatry
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
## 2018-10-18 NOTE — [facility] Group Notes (Signed)
LCSW Group Therapy Note   10/18/2018 1:15pm   Type of Therapy and Topic:  Group Therapy:  Positive Affirmations   Participation Level:  Did Not Attend  Description of Group: This group addressed positive affirmation toward self and others. Patients went around the room and identified two positive things about themselves and two positive things about a peer in the room. Patients reflected on how it felt to share something positive with others, to identify positive things about themselves, and to hear positive things from others. Patients were encouraged to have a daily reflection of positive characteristics or circumstances.  Therapeutic Goals 1. Patient will verbalize two of their positive qualities 2. Patient will demonstrate empathy for others by stating two positive qualities about a peer in the group 3. Patient will verbalize their feelings when voicing positive self affirmations and when voicing positive affirmations of others 4. Patients will discuss the potential positive impact on their wellness/recovery of focusing on positive traits of self and others. Summary of Patient Progress:    Therapeutic Modalities Cognitive Behavioral Therapy Motivational Interviewing  Ida RogueRodney B Dominie Benedick, KentuckyLCSW 10/18/2018 12:02 PM

## 2018-10-18 NOTE — Plan of Care (Signed)
Patient shows minimal treatment response as of today, patient continues to exhibit symptoms of generalized anxiety disorder, feeling sad and depressed, expressed poor rest, become easily frustrated with staffs, irritable at times and hyper verbal , endorsing suicidal thoughts that comes and goes was described. Patient denies AVH, no bizarre behaviors, patient is moody with  affect confused most of the time,..  Cognitive  Function is delayed , encourage patient to develop coping skills and engage in leisure activities with peers, compliant with medication is good , appetite is poor , sleep is restful but intermittent, patient is maintaining safety in the unit , no falls this shift and 15 minute safety rounding is in progress.,  No distress noted.

## 2018-10-18 NOTE — Plan of Care (Signed)
Patient is alert to self, and place, disoriented to time and intermittently disoriented to situation. Patient is mildly confused when it comes to medications and times. Patient is hypervigilant and restless as evidenced by pacing around the nurses station and hallway. Patient is suspicious of some staff and a staff member at his family care home. Patient comes to nurse and ask questions such as, "They won't hurt me here will they? RN provided comfort to patient. Patient states " I really want to find a new family care home because that lady is so mean and yells at me all of the time."  Patient takes his medications, states " I'm not going to eat." Although when meal tray comes patient is one of the first to the dinning area to eat. Patient has a worried look, states he has not been sleeping so well even though he has melatonin for night time. Patient denies HI, AVH denies SI today. No self injurious behaviors noted. Safety checks Q 15 minutes to continue. Problem: Medication: Goal: Compliance with prescribed medication regimen will improve Outcome: Progressing   Problem: Self-Concept: Goal: Ability to disclose and discuss suicidal ideas will improve Outcome: Progressing Goal: Will verbalize positive feelings about self Outcome: Not Progressing   Problem: Coping: Goal: Coping ability will improve Outcome: Not Progressing Goal: Will verbalize feelings Outcome: Not Progressing   Problem: Education: Goal: Knowledge of [facility] Education information/materials will improve Outcome: Progressing Goal: Emotional status will improve Outcome: Not Progressing Goal: Mental status will improve Outcome: Not Progressing Goal: Verbalization of understanding the information provided will improve Outcome: Not Progressing

## 2018-10-19 ENCOUNTER — Inpatient Hospital Stay: Payer: Medicaid Other | Admitting: Anesthesiology

## 2018-10-19 LAB — GLUCOSE, CAPILLARY: Glucose-Capillary: 96 mg/dL (ref 70–99)

## 2018-10-19 MED ORDER — METHOHEXITAL SODIUM 100 MG/10ML IV SOSY
PREFILLED_SYRINGE | INTRAVENOUS | Status: DC | PRN
  Administered 2018-10-19: 70 mg via INTRAVENOUS

## 2018-10-19 MED ORDER — GLYCOPYRROLATE 0.2 MG/ML IJ SOLN
INTRAMUSCULAR | Status: AC
  Filled 2018-10-19: qty 1

## 2018-10-19 MED ORDER — SODIUM CHLORIDE 0.9 % IV SOLN
500.0000 mL | Freq: Once | INTRAVENOUS | Status: AC
  Administered 2018-10-19: 500 mL via INTRAVENOUS

## 2018-10-19 MED ORDER — GLYCOPYRROLATE 0.2 MG/ML IJ SOLN
0.2000 mg | Freq: Once | INTRAMUSCULAR | Status: AC
  Administered 2018-10-19: 0.2 mg via INTRAVENOUS

## 2018-10-19 MED ORDER — ONDANSETRON HCL 4 MG/2ML IJ SOLN: 4.0000 mg | Freq: Once | INTRAMUSCULAR | Status: DC | PRN

## 2018-10-19 MED ORDER — SUCCINYLCHOLINE CHLORIDE 20 MG/ML IJ SOLN
INTRAMUSCULAR | Status: AC
  Filled 2018-10-19: qty 1

## 2018-10-19 MED ORDER — SODIUM CHLORIDE 0.9 % IV SOLN
INTRAVENOUS | Status: DC | PRN
  Administered 2018-10-19: 09:00:00 via INTRAVENOUS

## 2018-10-19 MED ORDER — SUCCINYLCHOLINE CHLORIDE 200 MG/10ML IV SOSY
PREFILLED_SYRINGE | INTRAVENOUS | Status: DC | PRN
  Administered 2018-10-19: 90 mg via INTRAVENOUS
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
## 2018-10-19 NOTE — Progress Notes (Signed)
D - Patient was in his room upon arrival to the unit. Patient was pleasant during assessment stating, "I had a good day and I am feeling better." Patient denies SI/HI/AVH and said his depression and anxiety were getting better. Patient got up for snack. Patient verbally contracted for safety with this Clinical research associatewriter.   A - Patient was compliant with medication administration per MD orders and procedures on the unit. Patient was cooperative and pleasant. Patient interacted appropriately on the unit with staff and peers.   R - Patient being monitored Q 15 minutes for safety per unit protocol. Patient told to inform staff if there are any issues or problems. Patient remains safe on the unit.
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
## 2018-10-19 NOTE — Transfer of Care (Signed)
Immediate Anesthesia Transfer of Care Note  Patient: Jason Patterson  Procedure(s) Performed: ECT TX  Patient Location: PACU  Anesthesia Type:General  Level of Consciousness: awake and patient cooperative  Airway & Oxygen Therapy: Patient Spontanous Breathing and Patient connected to face mask oxygen  Post-op Assessment: Report given to RN and Post -op Vital signs reviewed and stable  Post vital signs: Reviewed and stable  Last Vitals:  Vitals Value Taken Time  BP 146/96 10/19/2018 10:32 AM  Temp    Pulse 72 10/19/2018 10:34 AM  Resp 33 10/19/2018 10:34 AM  SpO2 100 % 10/19/2018 10:34 AM  Vitals shown include unvalidated device data.  Last Pain:  Vitals:   10/19/18 0935  TempSrc:   PainSc: 0-No pain      Patients Stated Pain Goal: 0 (10/09/18 2152)  Complications: No apparent anesthesia complications
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
## 2018-10-19 NOTE — Progress Notes (Signed)
Recreation Therapy Notes  Date: 10/19/2018  Time: 9:30 am   Location: Craft room   Behavioral response: N/A   Intervention Topic: Values  Discussion/Intervention: Patient did not attend group.   Clinical Observations/Feedback:  Patient did not attend group.   [name] LRT/CTRS        Jason Patterson 10/19/2018 10:11 AM 

## 2018-10-19 NOTE — Anesthesia Postprocedure Evaluation (Signed)
Anesthesia Post Note  Patient: Jason Patterson  Procedure(s) Performed: ECT TX  Patient location during evaluation: PACU Anesthesia Type: General Level of consciousness: awake and alert Pain management: pain level controlled Vital Signs Assessment: post-procedure vital signs reviewed and stable Respiratory status: spontaneous breathing and respiratory function stable Cardiovascular status: stable Anesthetic complications: no     Last Vitals:  Vitals:   10/19/18 1042 10/19/18 1052  BP: 123/88   Pulse:    Resp:    Temp:  36.7 C  SpO2:      Last Pain:  Vitals:   10/19/18 1052  TempSrc:   PainSc: 0-No pain                 [name]

## 2018-10-19 NOTE — Procedures (Signed)
ECT SERVICES Physician's Interval Evaluation & Treatment Note  Patient Identification: Jason Patterson MRN:  [id number] Date of Evaluation:  10/19/2018 TX #: 3  MADRS:   MMSE:   P.E. Findings:  No Change to physical exam  Psychiatric Interval Note:  Continues to report being depressed but it seems to be mostly focused on his fear of going back to his group home.  Subjective:  Patient is a 62 y.o. male seen for evaluation for Electroconvulsive Therapy. Claims to have suicidal thoughts but without any specific intent or plan  Treatment Summary:   [x]   Right Unilateral             []  Bilateral   % Energy : 0.3 ms 100%   Impedance: 1910 ohms  Seizure Energy Index: 5550 V squared  Postictal Suppression Index: 12% (but the reading was really not very good)  Seizure Concordance Index: 88%  Medications  Pre Shock: 1.0 mg Robinul 30 mg Toradol 70 mg Brevital 100 mg succinylcholine  Post Shock:    Seizure Duration: EMG 30 seconds EEG 47 seconds   Comments: Follow-up on Wednesday  Lungs:  [x]   Clear to auscultation               []  Other:   Heart:    [x]   Regular rhythm             []  irregular rhythm    [x]   Previous H&P reviewed, patient examined and there are NO CHANGES                 []   Previous H&P reviewed, patient examined and there are changes noted.   Jason RasmussenJohn Leandrew Keech, MD 12/2/201910:17 AM

## 2018-10-19 NOTE — Plan of Care (Signed)
Patient was irritable and unwilling to cooperative this morning.More relaxed and cooperative after ECT.Patient states "I am still depressed,I am not ready for discharge".Denies SI,HI and AVH.Compliant with medications.Appetite and energy level good.Support and encouragement given.

## 2018-10-19 NOTE — Plan of Care (Signed)
Patient was compliant with medication administration, per MD orders. Patient stated he was feeling better after having ECT today. Patient denies SI/HI/AVH  Problem: Medication: Goal: Compliance with prescribed medication regimen will improve Outcome: Progressing   Problem: Self-Concept: Goal: Will verbalize positive feelings about self Outcome: Progressing

## 2018-10-19 NOTE — Anesthesia Procedure Notes (Signed)
Date/Time: 10/19/2018 10:23 AM Performed by: Lily KocherPeralta, Perl Folmar, CRNA Pre-anesthesia Checklist: Patient identified, Emergency Drugs available, Suction available and Patient being monitored Patient Re-evaluated:Patient Re-evaluated prior to induction Oxygen Delivery Method: Circle system utilized Preoxygenation: Pre-oxygenation with 100% oxygen Induction Type: IV induction Ventilation: Mask ventilation without difficulty and Mask ventilation throughout procedure Airway Equipment and Method: Bite block Placement Confirmation: positive ETCO2 Dental Injury: Teeth and Oropharynx as per pre-operative assessment

## 2018-10-19 NOTE — [facility] Group Notes (Signed)
Overcoming Obstacles  10/19/2018 1PM  Type of Therapy and Topic:  Group Therapy:  Overcoming Obstacles  Participation Level:  Did Not Attend    Description of Group:    In this group patients will be encouraged to explore what they see as obstacles to their own wellness and recovery. They will be guided to discuss their thoughts, feelings, and behaviors related to these obstacles. The group will process together ways to cope with barriers, with attention given to specific choices patients can make. Each patient will be challenged to identify changes they are motivated to make in order to overcome their obstacles. This group will be process-oriented, with patients participating in exploration of their own experiences as well as giving and receiving support and challenge from other group members.   Therapeutic Goals: 1. Patient will identify personal and current obstacles as they relate to admission. 2. Patient will identify barriers that currently interfere with their wellness or overcoming obstacles.  3. Patient will identify feelings, thought process and behaviors related to these barriers. 4. Patient will identify two changes they are willing to make to overcome these obstacles:      Summary of Patient Progress     Therapeutic Modalities:   Cognitive Behavioral Therapy Solution Focused Therapy Motivational Interviewing Relapse Prevention Therapy    Aryon Nham, MSW, LCSW 10/19/2018 1:58 PM  

## 2018-10-19 NOTE — Anesthesia Post-op Follow-up Note (Signed)
Anesthesia QCDR form completed.        

## 2018-10-19 NOTE — Tx Team (Signed)
Interdisciplinary Treatment and Diagnostic Plan Update  10/19/2018 Time of Session: 8:30 AM Jason Patterson MRN: [id number]  Principal Diagnosis: Schizoaffective disorder, bipolar type (HCC)  Secondary Diagnoses: Principal Problem:   Schizoaffective disorder, bipolar type (HCC) Active Problems:   Essential hypertension   COPD (chronic obstructive pulmonary disease) (HCC)   GERD   PSORIASIS   Seizures (HCC)   Current Medications:  Current Facility-Administered Medications  Medication Dose Route Frequency Provider Last Rate Last Dose  . acetaminophen (TYLENOL) tablet 650 mg  650 mg Oral Q6H PRN Clapacs, Jackquline DenmarkJohn T, MD   650 mg at 10/09/18 2152  . albuterol (PROVENTIL) (2.5 MG/3ML) 0.083% nebulizer solution 2.5 mg  2.5 mg Inhalation Q4H PRN Clapacs, John T, MD      . alum & mag hydroxide-simeth (MAALOX/MYLANTA) 200-200-20 MG/5ML suspension 30 mL  30 mL Oral Q4H PRN Clapacs, Jackquline DenmarkJohn T, MD   30 mL at 10/16/18 1604  . aspirin EC tablet 81 mg  81 mg Oral Daily Clapacs, Jackquline DenmarkJohn T, MD   81 mg at 10/19/18 1133  . cholecalciferol (VITAMIN D3) tablet 2,000 Units  2,000 Units Oral Daily Clapacs, Jackquline DenmarkJohn T, MD   2,000 Units at 10/19/18 1133  . famotidine (PEPCID) tablet 10 mg  10 mg Oral BID Clapacs, Jackquline DenmarkJohn T, MD   10 mg at 10/19/18 1132  . feeding supplement (ENSURE ENLIVE) (ENSURE ENLIVE) liquid 237 mL  237 mL Oral TID BM Clapacs, John T, MD   237 mL at 10/19/18 1138  . glycopyrrolate (ROBINUL) 0.2 MG/ML injection           . hydrOXYzine (ATARAX/VISTARIL) tablet 50 mg  50 mg Oral TID PRN Clapacs, Jackquline DenmarkJohn T, MD   50 mg at 10/15/18 1800  . latanoprost (XALATAN) 0.005 % ophthalmic solution 1 drop  1 drop Both Eyes QHS Clapacs, Jackquline DenmarkJohn T, MD   1 drop at 10/18/18 2118  . levETIRAcetam (KEPPRA) tablet 1,000 mg  1,000 mg Oral BID Clapacs, Jackquline DenmarkJohn T, MD   1,000 mg at 10/19/18 1132  . loxapine (LOXITANE) capsule 25 mg  25 mg Oral TID Clapacs, Jackquline DenmarkJohn T, MD   25 mg at 10/19/18 1134  . magnesium hydroxide (MILK OF MAGNESIA)  suspension 30 mL  30 mL Oral Daily PRN Clapacs, John T, MD      . Melatonin TABS 10 mg  10 mg Oral QHS Clapacs, Jackquline DenmarkJohn T, MD   10 mg at 10/18/18 2120  . metoprolol tartrate (LOPRESSOR) tablet 12.5 mg  12.5 mg Oral BID Clapacs, Jackquline DenmarkJohn T, MD   12.5 mg at 10/19/18 0849  . multivitamin with minerals tablet 1 tablet  1 tablet Oral Daily Clapacs, Jackquline DenmarkJohn T, MD   1 tablet at 10/19/18 1132  . nicotine (NICODERM CQ - dosed in mg/24 hours) patch 21 mg  21 mg Transdermal Daily Clapacs, Jackquline DenmarkJohn T, MD   21 mg at 10/09/18 1323  . OLANZapine (ZYPREXA) tablet 20 mg  20 mg Oral BID Clapacs, Jackquline DenmarkJohn T, MD   20 mg at 10/19/18 1132  . ondansetron (ZOFRAN-ODT) disintegrating tablet 4 mg  4 mg Oral Q8H PRN Pucilowska, Jolanta B, MD   4 mg at 10/16/18 1000  . polyethylene glycol (MIRALAX / GLYCOLAX) packet 17 g  17 g Oral BID Clapacs, Jackquline DenmarkJohn T, MD   17 g at 10/18/18 0905  . senna (SENOKOT) tablet 8.6 mg  1 tablet Oral BID Clapacs, Jackquline DenmarkJohn T, MD   8.6 mg at 10/18/18 0759  . tamsulosin (FLOMAX) capsule 0.4 mg  0.4 mg  Oral Daily Clapacs, Jackquline Denmark, MD   0.4 mg at 10/19/18 1132  . traZODone (DESYREL) tablet 100 mg  100 mg Oral QHS Pucilowska, Jolanta B, MD   100 mg at 10/18/18 2120   PTA Medications: Medications Prior to Admission  Medication Sig Dispense Refill Last Dose  . albuterol (PROAIR HFA) 108 (90 BASE) MCG/ACT inhaler Inhale 1 puff into the lungs 4 (four) times daily.    10/18/2018 at Unknown time  . aspirin EC 81 MG tablet Take 81 mg by mouth daily.   10/18/2018 at Unknown time  . b complex vitamins capsule Take 1 capsule by mouth daily.   10/18/2018 at Unknown time  . l-methylfolate-B6-B12 (METANX) 3-35-2 MG TABS tablet Take 1 tablet by mouth daily.   10/18/2018 at Unknown time  . latanoprost (XALATAN) 0.005 % ophthalmic solution Place 1 drop into both eyes at bedtime. 2.5 mL 0 10/18/2018 at Unknown time  . levETIRAcetam (KEPPRA) 500 MG tablet Take 1,000 mg by mouth 2 (two) times daily.   10/18/2018 at Unknown time  . loxapine (LOXITANE)  25 MG capsule Take 25 mg by mouth 3 (three) times daily.   10/18/2018 at Unknown time  . NONFORMULARY OR COMPOUNDED ITEM Apply 1 application topically at bedtime. Triamcinolone cr 0.1% w/  Moisturizing cream Mix 1:1  And apply to dry irritated areas of face and legs   10/18/2018 at Unknown time  . OLANZapine (ZYPREXA) 20 MG tablet Take 20 mg by mouth 2 (two) times daily.   10/18/2018 at Unknown time  . polyethylene glycol (MIRALAX / GLYCOLAX) packet Take 17 g by mouth 2 (two) times daily.   10/18/2018 at Unknown time  . ranitidine (ZANTAC) 150 MG tablet Take 150 mg by mouth 2 (two) times daily.   10/18/2018 at Unknown time  . senna (SENOKOT) 8.6 MG TABS tablet Take 1 tablet by mouth 2 (two) times daily.   10/18/2018 at Unknown time  . tamsulosin (FLOMAX) 0.4 MG CAPS capsule Take 0.4 mg by mouth daily.    10/18/2018 at Unknown time  . Vitamin D, Ergocalciferol, 2000 units CAPS Take 2,000 Units by mouth daily.   10/18/2018 at Unknown time    Patient Stressors: Financial difficulties Other: Home care staff patient staes he can't get along with staff feels mistreated and not liked by staff  Patient Strengths: Ability for insight General fund of knowledge Motivation for treatment/growth  Treatment Modalities: Medication Management, Group therapy, Case management,  1 to 1 session with clinician, Psychoeducation, Recreational therapy.   Physician Treatment Plan for Primary Diagnosis: Schizoaffective disorder, bipolar type (HCC) Long Term Goal(s): Improvement in symptoms so as ready for discharge Improvement in symptoms so as ready for discharge   Short Term Goals: Ability to verbalize feelings will improve Ability to disclose and discuss suicidal ideas Ability to demonstrate self-control will improve Ability to maintain clinical measurements within normal limits will improve Compliance with prescribed medications will improve Ability to identify triggers associated with substance abuse/mental health  issues will improve  Medication Management: Evaluate patient's response, side effects, and tolerance of medication regimen.  Therapeutic Interventions: 1 to 1 sessions, Unit Group sessions and Medication administration.  Evaluation of Outcomes: Progressing  Physician Treatment Plan for Secondary Diagnosis: Principal Problem:   Schizoaffective disorder, bipolar type (HCC) Active Problems:   Essential hypertension   COPD (chronic obstructive pulmonary disease) (HCC)   GERD   PSORIASIS   Seizures (HCC)  Long Term Goal(s): Improvement in symptoms so as ready for discharge Improvement in  symptoms so as ready for discharge   Short Term Goals: Ability to verbalize feelings will improve Ability to disclose and discuss suicidal ideas Ability to demonstrate self-control will improve Ability to maintain clinical measurements within normal limits will improve Compliance with prescribed medications will improve Ability to identify triggers associated with substance abuse/mental health issues will improve     Medication Management: Evaluate patient's response, side effects, and tolerance of medication regimen.  Therapeutic Interventions: 1 to 1 sessions, Unit Group sessions and Medication administration.  Evaluation of Outcomes: Progressing   RN Treatment Plan for Primary Diagnosis: Schizoaffective disorder, bipolar type (HCC) Long Term Goal(s): Knowledge of disease and therapeutic regimen to maintain health will improve  Short Term Goals: Ability to participate in decision making will improve, Ability to disclose and discuss suicidal ideas, Ability to identify and develop effective coping behaviors will improve and Compliance with prescribed medications will improve  Medication Management: RN will administer medications as ordered by provider, will assess and evaluate patient's response and provide education to patient for prescribed medication. RN will report any adverse and/or side  effects to prescribing provider.  Therapeutic Interventions: 1 on 1 counseling sessions, Psychoeducation, Medication administration, Evaluate responses to treatment, Monitor vital signs and CBGs as ordered, Perform/monitor CIWA, COWS, AIMS and Fall Risk screenings as ordered, Perform wound care treatments as ordered.  Evaluation of Outcomes: Progressing   LCSW Treatment Plan for Primary Diagnosis: Schizoaffective disorder, bipolar type (HCC) Long Term Goal(s): Safe transition to appropriate next level of care at discharge, Engage patient in therapeutic group addressing interpersonal concerns.  Short Term Goals: Engage patient in aftercare planning with referrals and resources and Increase skills for wellness and recovery  Therapeutic Interventions: Assess for all discharge needs, 1 to 1 time with Social worker, Explore available resources and support systems, Assess for adequacy in community support network, Educate family and significant other(s) on suicide prevention, Complete Psychosocial Assessment, Interpersonal group therapy.  Evaluation of Outcomes: Progressing   Progress in Treatment: Attending groups: No. Participating in groups: No. Taking medication as prescribed: Yes. Toleration medication: Yes. Family/Significant other contact made: Yes, individual(s) contacted:  Pt's guardian has been contacted. Patient understands diagnosis: Yes. Discussing patient identified problems/goals with staff: No. Medical problems stabilized or resolved: Yes. Denies suicidal/homicidal ideation: As evidenced by:  Pt still have passive SI with no plan. Issues/concerns per patient self-inventory: No. Other: n/a  New problem(s) identified: No, Describe:  No new problems identified  New Short Term/Long Term Goal(s):NA  Patient Goals:  Pt was unable to participate in treatment team at this time.  Discharge Plan or Barriers: Tentative plan for pt to return to the [facility] with outpatient services in  place.      Reason for Continuation of Hospitalization: Anxiety Depression Medication stabilization Other; describe ECT  Estimated Length of Stay: 5-7 days    Attendees: Patient: 10/19/2018 2:00 PM  Physician: [name], MD 10/19/2018 2:00 PM  Nursing:  10/19/2018 2:00 PM  RN Care Manager: 10/19/2018 2:00 PM  Social Worker: Lowella Dandy, LCSW 10/19/2018 2:00 PM  Recreational Therapist:  10/19/2018 2:00 PM  Other:  10/19/2018 2:00 PM  Other:  10/19/2018 2:00 PM  Other: 10/19/2018 2:00 PM    Scribe for Treatment Team: Suzan Slick, LCSW 10/19/2018 2:00 PM

## 2018-10-20 ENCOUNTER — Other Ambulatory Visit: Payer: Self-pay | Admitting: Psychiatry

## 2018-10-20 NOTE — Progress Notes (Signed)
[facility] [phone].  Pt will return there tomorrow--they can transport.  CSW spoke to [facility]--they will resume services on Thursday, 12/5.  CSW spoke to legal guardian Allegra GranaShelia Marshall and informed her of discharge. Garner NashGregory Edgel Degnan, MSW, LCSW Clinical Social Worker 10/20/2018 10:46 AM
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
## 2018-10-20 NOTE — [facility] Group Notes (Signed)
  [facility] LCSW Group Therapy Note  Date/Time: 10/19/18, 1300  Type of Therapy/Topic:  Group Therapy:  Emotion Regulation  Participation Level:  Did Not Attend   Mood:  Description of Group:    The purpose of this group is to assist patients in learning to regulate negative emotions and experience positive emotions. Patients will be guided to discuss ways in which they have been vulnerable to their negative emotions. These vulnerabilities will be juxtaposed with experiences of positive emotions or situations, and patients challenged to use positive emotions to combat negative ones. Special emphasis will be placed on coping with negative emotions in conflict situations, and patients will process healthy conflict resolution skills.  Therapeutic Goals: 1. Patient will identify two positive emotions or experiences to reflect on in order to balance out negative emotions:  2. Patient will label two or more emotions that they find the most difficult to experience:  3. Patient will be able to demonstrate positive conflict resolution skills through discussion or role plays:   Summary of Patient Progress:       Therapeutic Modalities:   Cognitive Behavioral Therapy Feelings Identification Dialectical Behavioral Therapy  Daleen SquibbGreg Uzoma Vivona, LCSW

## 2018-10-20 NOTE — Progress Notes (Signed)
Recreation Therapy Notes   Date: 10/20/2018  Time: 9:30 am   Location: Craft room   Behavioral response: N/A   Intervention Topic: Coping skills  Discussion/Intervention: Patient did not attend group.   Clinical Observations/Feedback:  Patient did not attend group.   Lailynn Southgate LRT/CTRS        Letrell Attwood 10/20/2018 10:45 AM

## 2018-10-20 NOTE — Plan of Care (Signed)
Patient is appropriate in the unit.No irritable behaviors noted today.Stayed in bed most of the time.Compliant with medications.Patient stated that his depression is better.Denies SI,HI and AVH.Appetite and energy level good.Support and encouragement given.

## 2018-10-21 ENCOUNTER — Inpatient Hospital Stay: Payer: Medicaid Other

## 2018-10-21 ENCOUNTER — Inpatient Hospital Stay: Payer: Medicaid Other | Admitting: Anesthesiology

## 2018-10-21 LAB — GLUCOSE, CAPILLARY: Glucose-Capillary: 100 mg/dL — ABNORMAL HIGH (ref 70–99)

## 2018-10-21 MED ORDER — LOXAPINE SUCCINATE 25 MG PO CAPS: 25.0000 mg | ORAL_CAPSULE | Freq: Three times a day (TID) | ORAL | 1 refills | Status: DC

## 2018-10-21 MED ORDER — SUCCINYLCHOLINE CHLORIDE 20 MG/ML IJ SOLN
INTRAMUSCULAR | Status: AC
  Filled 2018-10-21: qty 1

## 2018-10-21 MED ORDER — VITAMIN D3 25 MCG (1000 UNIT) PO TABS: 2000.0000 [IU] | ORAL_TABLET | Freq: Every day | ORAL | 1 refills | Status: DC

## 2018-10-21 MED ORDER — SODIUM CHLORIDE 0.9 % IV SOLN
INTRAVENOUS | Status: DC | PRN
  Administered 2018-10-21: 09:00:00 via INTRAVENOUS

## 2018-10-21 MED ORDER — SENNA 8.6 MG PO TABS: 1.0000 | ORAL_TABLET | Freq: Every day | ORAL | 1 refills | Status: DC | PRN

## 2018-10-21 MED ORDER — GLYCOPYRROLATE 0.2 MG/ML IJ SOLN
INTRAMUSCULAR | Status: AC
  Administered 2018-10-21: 0.1 mg via INTRAVENOUS
  Filled 2018-10-21: qty 1

## 2018-10-21 MED ORDER — POLYETHYLENE GLYCOL 3350 17 G PO PACK: 17.0000 g | PACK | Freq: Two times a day (BID) | ORAL | 1 refills | Status: DC

## 2018-10-21 MED ORDER — TRAZODONE HCL 100 MG PO TABS: 100.0000 mg | ORAL_TABLET | Freq: Every day | ORAL | 1 refills | Status: DC

## 2018-10-21 MED ORDER — GLYCOPYRROLATE 0.2 MG/ML IJ SOLN
0.1000 mg | Freq: Once | INTRAMUSCULAR | Status: AC
  Administered 2018-10-21: 0.1 mg via INTRAVENOUS

## 2018-10-21 MED ORDER — FAMOTIDINE 10 MG PO TABS: 10.0000 mg | ORAL_TABLET | Freq: Two times a day (BID) | ORAL | 1 refills | Status: DC

## 2018-10-21 MED ORDER — LATANOPROST 0.005 % OP SOLN: 1.0000 [drp] | Freq: Every day | OPHTHALMIC | 1 refills | Status: DC

## 2018-10-21 MED ORDER — ASPIRIN EC 81 MG PO TBEC: 81.0000 mg | DELAYED_RELEASE_TABLET | Freq: Every day | ORAL | 1 refills | Status: DC

## 2018-10-21 MED ORDER — OLANZAPINE 20 MG PO TABS: 20.0000 mg | ORAL_TABLET | Freq: Two times a day (BID) | ORAL | 1 refills | Status: DC

## 2018-10-21 MED ORDER — SENNA 8.6 MG PO TABS: 1.0000 | ORAL_TABLET | Freq: Every day | ORAL | Status: DC | PRN

## 2018-10-21 MED ORDER — TAMSULOSIN HCL 0.4 MG PO CAPS: 0.4000 mg | ORAL_CAPSULE | Freq: Every day | ORAL | 1 refills | Status: DC

## 2018-10-21 MED ORDER — ALBUTEROL SULFATE HFA 108 (90 BASE) MCG/ACT IN AERS: 1.0000 | INHALATION_SPRAY | Freq: Four times a day (QID) | RESPIRATORY_TRACT | 1 refills | Status: DC | PRN

## 2018-10-21 MED ORDER — ADULT MULTIVITAMIN W/MINERALS CH: 1.0000 | ORAL_TABLET | Freq: Every day | ORAL | 1 refills | Status: DC

## 2018-10-21 MED ORDER — METHOHEXITAL SODIUM 100 MG/10ML IV SOSY
PREFILLED_SYRINGE | INTRAVENOUS | Status: DC | PRN
  Administered 2018-10-21: 70 mg via INTRAVENOUS

## 2018-10-21 MED ORDER — SODIUM CHLORIDE 0.9 % IV SOLN
500.0000 mL | Freq: Once | INTRAVENOUS | Status: AC
  Administered 2018-10-21: 500 mL via INTRAVENOUS

## 2018-10-21 MED ORDER — LEVETIRACETAM 500 MG PO TABS: 1000.0000 mg | ORAL_TABLET | Freq: Two times a day (BID) | ORAL | 1 refills | Status: DC

## 2018-10-21 MED ORDER — METOPROLOL TARTRATE 25 MG PO TABS: 12.5000 mg | ORAL_TABLET | Freq: Two times a day (BID) | ORAL | 1 refills | Status: DC

## 2018-10-21 MED ORDER — METHOHEXITAL SODIUM 0.5 G IJ SOLR
INTRAMUSCULAR | Status: AC
  Filled 2018-10-21: qty 500

## 2018-10-21 MED ORDER — SUCCINYLCHOLINE CHLORIDE 200 MG/10ML IV SOSY
PREFILLED_SYRINGE | INTRAVENOUS | Status: DC | PRN
  Administered 2018-10-21: 100 mg via INTRAVENOUS
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
## 2018-10-21 NOTE — Anesthesia Procedure Notes (Signed)
Performed by: Karlis Cregg, CRNA Pre-anesthesia Checklist: Patient identified, Emergency Drugs available, Suction available and Patient being monitored Patient Re-evaluated:Patient Re-evaluated prior to induction Oxygen Delivery Method: Circle system utilized Preoxygenation: Pre-oxygenation with 100% oxygen Induction Type: IV induction Ventilation: Mask ventilation without difficulty and Mask ventilation throughout procedure Airway Equipment and Method: Bite block Placement Confirmation: positive ETCO2 Dental Injury: Teeth and Oropharynx as per pre-operative assessment        
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
## 2018-10-21 NOTE — Progress Notes (Signed)
Patient's morning medications were held because he is NPO for ECT. Patient's BP medication was also held because order parameters were not met, his BP was 95/61 this morning.

## 2018-10-21 NOTE — [facility] Group Notes (Signed)
[facility] Group Notes:  (Nursing/MHT/Case Management/Adjunct)  Date:  10/21/2018  Time:  12:17 AM  Type of Therapy:  Group Therapy  Participation Level:  Active  Participation Quality:  Appropriate  Affect:  Appropriate  Cognitive:  Appropriate  Insight:  Appropriate  Engagement in Group:  Engaged  Modes of Intervention:  Support  Summary of Progress/Problems:  Jason Patterson 10/21/2018, 12:17 AM

## 2018-10-21 NOTE — Progress Notes (Signed)
D- Patient alert and oriented. Patient presents in a sad/depressed mood on assessment stating that he did"t sleep well last night "I just couldn't sleep, I asked for something, but it didn't work". Patient endorses passive SI without a plan stating "maybe myself because I don't like the place I'm going". Patient rated his depression and anxiety a "10/10" stating to this writer "I have to go back to that place today" referencing his family care home. Patient denies HI, AVH, and pain at this time. Patient has no stated goals for today.  A- Support and encouragement provided.  Routine safety checks conducted every 15 minutes. Patient informed to notify staff with problems or concerns.  R- Patient contracts for safety at this time. Patient compliant with treatment plan. Patient receptive, calm, and cooperative. Patient interacts well with others on the unit. Patient remains safe at this time.

## 2018-10-21 NOTE — Anesthesia Postprocedure Evaluation (Signed)
Anesthesia Post Note  Patient: Jason Patterson  Procedure(s) Performed: ECT TX  Patient location during evaluation: PACU Anesthesia Type: General Level of consciousness: awake and alert Pain management: pain level controlled Vital Signs Assessment: post-procedure vital signs reviewed and stable Respiratory status: spontaneous breathing, nonlabored ventilation and respiratory function stable Cardiovascular status: blood pressure returned to baseline and stable Postop Assessment: no apparent nausea or vomiting Anesthetic complications: no     Last Vitals:  Vitals:   10/21/18 1106 10/21/18 1226  BP: 111/77 126/87  Pulse: 82 (!) 104  Resp: 20 16  Temp:  36.8 C  SpO2: 100% 96%    Last Pain:  Vitals:   10/21/18 1226  TempSrc: Oral  PainSc:                  Christia ReadingScott T Ellyson Rarick

## 2018-10-21 NOTE — Discharge Instructions (Addendum)
1)  The drugs that you have been given will stay in your system until tomorrow so for the       next 24 hours you should not: ° A. Drive an automobile ° B. Make any legal decisions ° C. Drink any alcoholic beverages ° °2)  You may resume your regular meals upon return home. ° °3)  A responsible adult must take you home.  Someone should stay with you for a few          hours, then be available by phone for the remainder of the treatment day. ° °4)  You May experience any of the following symptoms: ° Headache, Nausea and a dry mouth (due to the medications you were given),  temporary memory loss and some confusion, or sore muscles (a warm bath  should help this).  If you you experience any of these symptoms let us know on                your return visit. ° °5)  Report any of the following: any acute discomfort, severe headache, or temperature        greater than 100.5 F.   Also report any unusual redness, swelling, drainage, or pain         at your IV site. ° °  You may report Symptoms to:  ECT PROGRAM- [facility] °         Phone: [phone], ECT Department  °         or Dr. Clapac's office [phone] ° °6)  Your next ECT Treatment is Wednesday December 18  ° We will call 2 days prior to your scheduled appointment for arrival times. ° °7)  Nothing to eat or drink after midnight the night before your procedure. ° °8)  Take     With a sip of water the morning of your procedure. ° °9)  Other Instructions: Call [phone] to cancel the morning of your procedure due         to illness or emergency. ° °10) We will call within 72 hours to assess how you are feeling.  °

## 2018-10-21 NOTE — Procedures (Signed)
ECT SERVICES Physician's Interval Evaluation & Treatment Note  Patient Identification: Jason Patterson MRN:  [id number] Date of Evaluation:  10/21/2018 TX #: 4  MADRS: 30  MMSE: 30  P.E. Findings:  No change to physical exam  Psychiatric Interval Note:  10 used to be very anxious no active suicidal ideation no overt psychosis  Subjective:  Patient is a 62 y.o. male seen for evaluation for Electroconvulsive Therapy. Still feeling nervous about going home  Treatment Summary:   [x]   Right Unilateral             []  Bilateral   % Energy : 0.3 ms 100%   Impedance: 1490 ohms  Seizure Energy Index: 2610 V squared  Postictal Suppression Index: 55%  Seizure Concordance Index: 92%  Medications  Pre Shock: Robinul 0.1 mg Toradol 30 mg Brevital 70 mg succinylcholine 100 mg  Post Shock:    Seizure Duration: 20 seconds EMG 40 seconds EEG   Comments: Follow-up 2 weeks as an outpatient  Lungs:  [x]   Clear to auscultation               []  Other:   Heart:    [x]   Regular rhythm             []  irregular rhythm    [x]   Previous H&P reviewed, patient examined and there are NO CHANGES                 []   Previous H&P reviewed, patient examined and there are changes noted.   Jason RasmussenJohn Shemeika Starzyk, MD 12/4/201910:36 AM

## 2018-10-21 NOTE — Progress Notes (Signed)
Patient ID: Jason Patterson, male   DOB: May 12, 1956, 62 y.o.   MRN: [id number]   Discharge Note:  Patient denies SI/HI/AVH at this time. Discharge instructions, AVS, transition record, and prescriptions gone over with patient. Patient agrees to comply with medication management, follow-up visit, and outpatient therapy. Patient belongings returned to patient. Patient questions and concerns addressed and answered. Patient ambulatory off unit. Patient discharged to family care home with staff member.

## 2018-10-21 NOTE — Progress Notes (Signed)
Recreation Therapy Notes  Date: 10/21/2018  Time: 9:30 am   Location: Craft room   Behavioral response: N/A   Intervention Topic: Relaxation  Discussion/Intervention: Patient did not attend group.   Clinical Observations/Feedback:  Patient did not attend group.   Haydyn Liddell LRT/CTRS        Dmarion Perfect 10/21/2018 12:44 PM

## 2018-10-21 NOTE — Progress Notes (Signed)
Patient refused his morning Miralax, Senokot, and Nicotine patch. This Clinical research associatewriter notified MD.

## 2018-10-21 NOTE — BHH Suicide Risk Assessment (Signed)
[facility]   Principal Problem: Schizoaffective disorder, bipolar type [facility](HCC) Discharge Diagnoses: Principal Problem:   Schizoaffective disorder, bipolar type (HCC) Active Problems:   Essential hypertension   COPD (chronic obstructive pulmonary disease) (HCC)   GERD   PSORIASIS   Seizures (HCC)   Total Time spent with patient: 45 minutes  Musculoskeletal: Strength & Muscle Tone: within normal limits Gait & Station: normal Patient leans: N/A  Psychiatric Specialty Exam: Review of Systems  Constitutional: Negative.   HENT: Negative.   Eyes: Negative.   Respiratory: Negative.   Cardiovascular: Negative.   Gastrointestinal: Negative.   Musculoskeletal: Negative.   Skin: Negative.   Neurological: Negative.   Psychiatric/Behavioral: Negative for depression, hallucinations, memory loss, substance abuse and suicidal ideas. The patient is nervous/anxious. The patient does not have insomnia.     Blood pressure 126/87, pulse (!) 104, temperature 98.3 F (36.8 C), temperature source Oral, resp. rate 16, height 5\' 5"  (1.651 m), weight 76.2 kg, SpO2 96 %.Body mass index is 27.96 kg/m.  General Appearance: Casual  Eye Contact::  Fair  Speech:  Slow409  Volume:  Decreased  Mood:  Euthymic  Affect:  Constricted  Thought Process:  Coherent  Orientation:  Full (Time, Place, and Person)  Thought Content:  Logical  Suicidal Thoughts:  No  Homicidal Thoughts:  No  Memory:  Immediate;   Fair Recent;   Fair Remote;   Fair  Judgement:  Fair  Insight:  Fair  Psychomotor Activity:  Normal  Concentration:  Fair  Recall:  [employer]Fair  Fund of Knowledge:Fair  Language: Fair  Akathisia:  No  Handed:  Right  AIMS (if indicated):     Assets:  Desire for Improvement Housing Resilience Social Support  Sleep:  Number of Hours: 8  Cognition: Impaired,  Mild  ADL's:  Intact   Mental Status Per Nursing Assessment::   On Admission:  Self-harm thoughts  Demographic  Factors:  Male, Caucasian and Low socioeconomic status  Loss Factors: Financial problems/change in socioeconomic status  Historical Factors: Impulsivity  Risk Reduction Factors:   Religious beliefs about death, Living with another person, especially a relative, Positive social support and Positive therapeutic relationship  Continued Clinical Symptoms:  Depression:   Anhedonia Impulsivity  Cognitive Features That Contribute To Risk:  Loss of executive function    Suicide Risk:  Mild:  Suicidal ideation of limited frequency, intensity, duration, and specificity.  There are no identifiable plans, no associated intent, mild dysphoria and related symptoms, good self-control (both objective and subjective assessment), few other risk factors, and identifiable protective factors, including available and accessible social support.  Follow-up Information    [facility] Go on 10/22/2018.   Why:  Your ACT team will meet you Thursday afternoon, 10/22/18, at the group home to resume services.  Contact information: [address] [place] [address] [phone]        [facility] Go on 11/04/2018.   Why:  Your next ECT treatment is Wednesday, 11/04/18. The RN from the ECT clinic will call you with the time you should arrive. [facility]  AND ID   Contact information: [address] [id number] [place] [id number] [phone]          Plan Of Care/Follow-up recommendations:  Activity:  as tolerated Diet:  regular Other:  follow up ECT 11/04/18  [name] Kaytee Taliercio, MD 10/21/2018, 1:23 PM

## 2018-10-21 NOTE — Anesthesia Post-op Follow-up Note (Signed)
Anesthesia QCDR form completed.        

## 2018-10-21 NOTE — Transfer of Care (Signed)
Immediate Anesthesia Transfer of Care Note  Patient: Jason Patterson  Procedure(s) Performed: ECT TX  Patient Location: PACU  Anesthesia Type:General  Level of Consciousness: sedated  Airway & Oxygen Therapy: Patient Spontanous Breathing and Patient connected to face mask oxygen  Post-op Assessment: Report given to RN and Post -op Vital signs reviewed and stable  Post vital signs: Reviewed and stable  Last Vitals:  Vitals Value Taken Time  BP 119/80 10/21/2018 10:44 AM  Temp    Pulse 84 10/21/2018 10:47 AM  Resp 23 10/21/2018 10:47 AM  SpO2 99 % 10/21/2018 10:47 AM  Vitals shown include unvalidated device data.  Last Pain:  Vitals:   10/21/18 0939  TempSrc: Oral  PainSc: 0-No pain      Patients Stated Pain Goal: 0 (10/09/18 2152)  Complications: No apparent anesthesia complications

## 2018-10-21 NOTE — Plan of Care (Signed)
Patient has been calm in the unit no outburst, Patient responding well to treatment regimen, no episodes of agitation, Patient has disorganized thought process. Minimized socialization with peers, contact for safety, mood is fair and affect is fair. Patient is maintaining safety in the unit, no falls this shift and room is cleared of any clusters. Pt express no concerns. Denies SI/HI/AVH at this time. Patient is monitored every 15 minutes and regularly for safety. Patient is compliant with his medication and no adverse reaction reported. Pt denies anxiety and depression. Room is within eye sight from the nurses station. Pt is sleeping long hours without any interruptions. Appetite is fair, hydration is moderate, drinks, liquids and juices and no distress noted. Pt encouraged and supported  to engage more in leisure activities with peers, Patient acknowledge information provided.   Problem: Medication: Goal: Compliance with prescribed medication regimen will improve Outcome: Progressing   Problem: Self-Concept: Goal: Ability to disclose and discuss suicidal ideas will improve Outcome: Progressing Goal: Will verbalize positive feelings about self Outcome: Progressing   Problem: Coping: Goal: Coping ability will improve Outcome: Progressing Goal: Will verbalize feelings Outcome: Progressing   Problem: Education: Goal: Knowledge of [facility] Education information/materials will improve Outcome: Progressing Goal: Emotional status will improve Outcome: Progressing Goal: Mental status will improve Outcome: Progressing Goal: Verbalization of understanding the information provided will improve Outcome: Progressing   Problem: Education: Goal: Knowledge of General Education information will improve Description Including pain rating scale, medication(s)/side effects and non-pharmacologic comfort measures Outcome: Progressing

## 2018-10-23 ENCOUNTER — Telehealth: Payer: Self-pay

## 2018-10-30 ENCOUNTER — Telehealth: Payer: Self-pay
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
## 2018-11-05 ENCOUNTER — Other Ambulatory Visit: Payer: Self-pay | Admitting: Psychiatry

## 2018-11-06 ENCOUNTER — Ambulatory Visit: Payer: Self-pay | Admitting: Anesthesiology

## 2018-11-06 ENCOUNTER — Encounter: Payer: Self-pay | Admitting: Anesthesiology

## 2018-11-06 ENCOUNTER — Encounter
Admission: RE | Admit: 2018-11-06 | Discharge: 2018-11-06 | Disposition: A | Discharge status: Home / Self Care | Payer: Medicaid Other | Source: Ambulatory Visit | Attending: Psychiatry | Admitting: Psychiatry

## 2018-11-06 DIAGNOSIS — J449 Chronic obstructive pulmonary disease, unspecified: Secondary | ICD-10-CM | POA: Insufficient documentation

## 2018-11-06 DIAGNOSIS — Z79899 Other long term (current) drug therapy: Secondary | ICD-10-CM | POA: Insufficient documentation

## 2018-11-06 DIAGNOSIS — I10 Essential (primary) hypertension: Secondary | ICD-10-CM | POA: Diagnosis not present

## 2018-11-06 DIAGNOSIS — N289 Disorder of kidney and ureter, unspecified: Secondary | ICD-10-CM | POA: Insufficient documentation

## 2018-11-06 DIAGNOSIS — F17210 Nicotine dependence, cigarettes, uncomplicated: Secondary | ICD-10-CM | POA: Insufficient documentation

## 2018-11-06 DIAGNOSIS — G40909 Epilepsy, unspecified, not intractable, without status epilepticus: Secondary | ICD-10-CM | POA: Diagnosis not present

## 2018-11-06 DIAGNOSIS — F419 Anxiety disorder, unspecified: Secondary | ICD-10-CM | POA: Diagnosis not present

## 2018-11-06 DIAGNOSIS — F251 Schizoaffective disorder, depressive type: Secondary | ICD-10-CM | POA: Diagnosis not present

## 2018-11-06 DIAGNOSIS — Z888 Allergy status to other drugs, medicaments and biological substances status: Secondary | ICD-10-CM | POA: Diagnosis not present

## 2018-11-06 DIAGNOSIS — K219 Gastro-esophageal reflux disease without esophagitis: Secondary | ICD-10-CM | POA: Insufficient documentation

## 2018-11-06 DIAGNOSIS — Z880 Allergy status to penicillin: Secondary | ICD-10-CM | POA: Diagnosis not present

## 2018-11-06 DIAGNOSIS — F259 Schizoaffective disorder, unspecified: Secondary | ICD-10-CM | POA: Diagnosis not present

## 2018-11-06 DIAGNOSIS — F329 Major depressive disorder, single episode, unspecified: Secondary | ICD-10-CM | POA: Insufficient documentation

## 2018-11-06 MED ORDER — KETOROLAC TROMETHAMINE 30 MG/ML IJ SOLN
INTRAMUSCULAR | Status: AC
  Filled 2018-11-06: qty 1

## 2018-11-06 MED ORDER — FENTANYL CITRATE (PF) 100 MCG/2ML IJ SOLN: 25.0000 ug | INTRAMUSCULAR | Status: DC | PRN

## 2018-11-06 MED ORDER — SUCCINYLCHOLINE CHLORIDE 20 MG/ML IJ SOLN
INTRAMUSCULAR | Status: DC | PRN
  Administered 2018-11-06: 90 mg via INTRAVENOUS

## 2018-11-06 MED ORDER — SODIUM CHLORIDE 0.9 % IV SOLN
500.0000 mL | Freq: Once | INTRAVENOUS | Status: AC
  Administered 2018-11-06: 500 mL via INTRAVENOUS

## 2018-11-06 MED ORDER — SUCCINYLCHOLINE CHLORIDE 20 MG/ML IJ SOLN
INTRAMUSCULAR | Status: AC
  Filled 2018-11-06: qty 1

## 2018-11-06 MED ORDER — GLYCOPYRROLATE 0.2 MG/ML IJ SOLN
0.1000 mg | Freq: Once | INTRAMUSCULAR | Status: AC
  Administered 2018-11-06: 0.1 mg via INTRAVENOUS

## 2018-11-06 MED ORDER — KETOROLAC TROMETHAMINE 30 MG/ML IJ SOLN
30.0000 mg | Freq: Once | INTRAMUSCULAR | Status: AC
  Administered 2018-11-06: 30 mg via INTRAVENOUS

## 2018-11-06 MED ORDER — SODIUM CHLORIDE 0.9 % IV SOLN
INTRAVENOUS | Status: DC | PRN
  Administered 2018-11-06: 11:00:00 via INTRAVENOUS

## 2018-11-06 MED ORDER — METHOHEXITAL SODIUM 100 MG/10ML IV SOSY
PREFILLED_SYRINGE | INTRAVENOUS | Status: DC | PRN
  Administered 2018-11-06: 70 mg via INTRAVENOUS

## 2018-11-06 MED ORDER — GLYCOPYRROLATE 0.2 MG/ML IJ SOLN
INTRAMUSCULAR | Status: AC
  Filled 2018-11-06: qty 1

## 2018-11-06 MED ORDER — ONDANSETRON HCL 4 MG/2ML IJ SOLN: 4.0000 mg | Freq: Once | INTRAMUSCULAR | Status: DC | PRN

## 2018-11-06 NOTE — Procedures (Signed)
ECT SERVICES Physician's Interval Evaluation & Treatment Note  Patient Identification: Jason ShadeGordon Lee Patterson MRN:  [id number] Date of Evaluation:  11/06/2018 TX #: 5  MADRS:   MMSE:   P.E. Findings:  No change.  Psychiatric Interval Note:  Mood is reported as being down.  Lots of focus on how much he hates the group home.  Subjective:  Patient is a 62 y.o. male seen for evaluation for Electroconvulsive Therapy. Lots of complaints about the group home feeling negative.  Suicidal ideation without any intent or plan  Treatment Summary:   [x]   Right Unilateral             []  Bilateral   % Energy : 0.3 ms 100%   Impedance: 1520 ohms  Seizure Energy Index: 4551 V squared  Postictal Suppression Index: 51%  Seizure Concordance Index: 91%  Medications  Pre Shock: Robinul 0.1 mg Toradol 30 mg Brevital 70 mg succinylcholine 100 mg  Post Shock:    Seizure Duration: EMG 17 seconds EEG 50 seconds   Comments: Follow-up 2 weeks  Lungs:  [x]   Clear to auscultation               []  Other:   Heart:    [x]   Regular rhythm             []  irregular rhythm    [x]   Previous H&P reviewed, patient examined and there are NO CHANGES                 []   Previous H&P reviewed, patient examined and there are changes noted.   [name] Clapacs, MD 12/20/201910:45 AM

## 2018-11-06 NOTE — Anesthesia Post-op Follow-up Note (Signed)
Anesthesia QCDR form completed.        

## 2018-11-06 NOTE — Anesthesia Postprocedure Evaluation (Signed)
Anesthesia Post Note  Patient: Jason Patterson  Procedure(s) Performed: ECT TX  Patient location during evaluation: PACU Anesthesia Type: General Level of consciousness: awake and alert Pain management: pain level controlled Vital Signs Assessment: post-procedure vital signs reviewed and stable Respiratory status: spontaneous breathing, nonlabored ventilation, respiratory function stable and patient connected to nasal cannula oxygen Cardiovascular status: blood pressure returned to baseline and stable Postop Assessment: no apparent nausea or vomiting Anesthetic complications: no     Last Vitals:  Vitals:   11/06/18 1149 11/06/18 1154  BP: 112/90 112/85  Pulse: 87 97  Resp: 18 16  Temp:    SpO2: 94%     Last Pain:  Vitals:   11/06/18 1154  TempSrc: Oral  PainSc: 0-No pain                 Chane Magner S

## 2018-11-06 NOTE — Discharge Instructions (Addendum)
1)  The drugs that you have been given will stay in your system until tomorrow so for the       next 24 hours you should not:  A. Drive an automobile  B. Make any legal decisions  C. Drink any alcoholic beverages  2)  You may resume your regular meals upon return home.  3)  A responsible adult must take you home.  Someone should stay with you for a few          hours, then be available by phone for the remainder of the treatment day.  4)  You May experience any of the following symptoms:  Headache, Nausea and a dry mouth (due to the medications you were given),  temporary memory loss and some confusion, or sore muscles (a warm bath  should help this).  If you you experience any of these symptoms let us know on                your return visit.  5)  Report any of the following: any acute discomfort, severe headache, or temperature        greater than 100.5 F.   Also report any unusual redness, swelling, drainage, or pain         at your IV site.    You may report Symptoms to:  [facility] [place] [facility]          Phone: [phone], ECT Department           or Dr. Shary Keylapac's office [phone]  6)  Your next ECT Treatment is Friday October 21, 2019  We will call 2 days prior to your scheduled appointment for arrival times.  7)  Nothing to eat or drink after midnight the night before your procedure.  8)  Take     With a sip of water the morning of your procedure.  9)  Other Instructions: Call [phone] to cancel the morning of your procedure due         to illness or emergency.  10) We will call within 72 hours to assess how you are feeling.

## 2018-11-06 NOTE — Transfer of Care (Signed)
Immediate Anesthesia Transfer of Care Note  Patient: Jason Patterson  Procedure(s) Performed: ECT TX  Patient Location: PACU  Anesthesia Type:General  Level of Consciousness: drowsy and patient cooperative  Airway & Oxygen Therapy: Patient Spontanous Breathing and Patient connected to face mask oxygen  Post-op Assessment: Report given to RN and Post -op Vital signs reviewed and stable  Post vital signs: Reviewed and stable  Last Vitals:  Vitals Value Taken Time  BP 144/92 11/06/2018 11:23 AM  Temp 37.2 C 11/06/2018 11:23 AM  Pulse 84 11/06/2018 11:23 AM  Resp 25 11/06/2018 11:23 AM  SpO2 98 % 11/06/2018 11:23 AM  Vitals shown include unvalidated device data.  Last Pain:  Vitals:   11/06/18 0908  TempSrc: Oral  PainSc: 0-No pain         Complications: No apparent anesthesia complications
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
## 2018-11-06 NOTE — Progress Notes (Signed)
Pt very confused  Thinks he is going to die   Can"t remember that he has had a treatment

## 2018-11-19 ENCOUNTER — Other Ambulatory Visit: Payer: Self-pay | Admitting: Psychiatry

## 2018-11-20 ENCOUNTER — Encounter
Admission: RE | Admit: 2018-11-20 | Discharge: 2018-11-20 | Disposition: A | Discharge status: Home / Self Care | Payer: Medicaid Other | Source: Ambulatory Visit | Attending: Psychiatry | Admitting: Psychiatry

## 2018-11-20 ENCOUNTER — Ambulatory Visit: Payer: Self-pay | Admitting: Anesthesiology

## 2018-11-20 ENCOUNTER — Encounter: Payer: Self-pay | Admitting: Anesthesiology

## 2018-11-20 DIAGNOSIS — F17210 Nicotine dependence, cigarettes, uncomplicated: Secondary | ICD-10-CM | POA: Diagnosis not present

## 2018-11-20 DIAGNOSIS — K219 Gastro-esophageal reflux disease without esophagitis: Secondary | ICD-10-CM | POA: Diagnosis not present

## 2018-11-20 DIAGNOSIS — I10 Essential (primary) hypertension: Secondary | ICD-10-CM | POA: Diagnosis not present

## 2018-11-20 DIAGNOSIS — F332 Major depressive disorder, recurrent severe without psychotic features: Secondary | ICD-10-CM | POA: Diagnosis not present

## 2018-11-20 DIAGNOSIS — J449 Chronic obstructive pulmonary disease, unspecified: Secondary | ICD-10-CM | POA: Insufficient documentation

## 2018-11-20 DIAGNOSIS — F319 Bipolar disorder, unspecified: Secondary | ICD-10-CM | POA: Diagnosis not present

## 2018-11-20 DIAGNOSIS — F259 Schizoaffective disorder, unspecified: Secondary | ICD-10-CM | POA: Insufficient documentation

## 2018-11-20 DIAGNOSIS — R45851 Suicidal ideations: Secondary | ICD-10-CM | POA: Diagnosis not present

## 2018-11-20 MED ORDER — SUCCINYLCHOLINE CHLORIDE 20 MG/ML IJ SOLN
INTRAMUSCULAR | Status: DC | PRN
  Administered 2018-11-20: 90 mg via INTRAVENOUS

## 2018-11-20 MED ORDER — SODIUM CHLORIDE 0.9 % IV SOLN
INTRAVENOUS | Status: DC | PRN
  Administered 2018-11-20: 11:00:00 via INTRAVENOUS

## 2018-11-20 MED ORDER — GLYCOPYRROLATE 0.2 MG/ML IJ SOLN
0.1000 mg | Freq: Once | INTRAMUSCULAR | Status: AC
  Administered 2018-11-20: 0.1 mg via INTRAVENOUS

## 2018-11-20 MED ORDER — KETOROLAC TROMETHAMINE 30 MG/ML IJ SOLN
INTRAMUSCULAR | Status: AC
  Administered 2018-11-20: 30 mg via INTRAVENOUS
  Filled 2018-11-20: qty 1

## 2018-11-20 MED ORDER — METHOHEXITAL SODIUM 100 MG/10ML IV SOSY
PREFILLED_SYRINGE | INTRAVENOUS | Status: DC | PRN
  Administered 2018-11-20: 70 mg via INTRAVENOUS

## 2018-11-20 MED ORDER — GLYCOPYRROLATE 0.2 MG/ML IJ SOLN
INTRAMUSCULAR | Status: AC
  Filled 2018-11-20: qty 1

## 2018-11-20 MED ORDER — SODIUM CHLORIDE 0.9 % IV SOLN
500.0000 mL | Freq: Once | INTRAVENOUS | Status: AC
  Administered 2018-11-20: 500 mL via INTRAVENOUS

## 2018-11-20 MED ORDER — KETOROLAC TROMETHAMINE 30 MG/ML IJ SOLN
30.0000 mg | Freq: Once | INTRAMUSCULAR | Status: AC
  Administered 2018-11-20: 30 mg via INTRAVENOUS

## 2018-11-20 NOTE — Transfer of Care (Signed)
Immediate Anesthesia Transfer of Care Note  Patient: Jason Patterson  Procedure(s) Performed: ECT TX  Patient Location: PACU  Anesthesia Type:General  Level of Consciousness: awake, alert  and oriented  Airway & Oxygen Therapy: Patient Spontanous Breathing and Patient connected to face mask oxygen  Post-op Assessment: Report given to RN and Post -op Vital signs reviewed and stable  Post vital signs: Reviewed and stable  Last Vitals:  Vitals Value Taken Time  BP    Temp    Pulse    Resp    SpO2      Last Pain:  Vitals:   11/20/18 0935  TempSrc: Oral         Complications: No apparent anesthesia complications
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
## 2018-11-20 NOTE — Anesthesia Preprocedure Evaluation (Signed)
Anesthesia Evaluation  Patient identified by MRN, date of birth, ID band Patient awake    Reviewed: Allergy & Precautions, NPO status , Patient's Chart, lab work & pertinent test results  History of Anesthesia Complications Negative for: history of anesthetic complications  Airway Mallampati: III  TM Distance: >3 FB Neck ROM: Full    Dental  (+) Poor Dentition   Pulmonary COPD,  COPD inhaler, Current Smoker,    breath sounds clear to auscultation- rhonchi (-) wheezing      Cardiovascular hypertension, Pt. on medications (-) CAD, (-) Past MI, (-) Cardiac Stents and (-) CABG Dysrhythmias: RBBB.  Rhythm:Regular Rate:Normal - Systolic murmurs and - Diastolic murmurs    Neuro/Psych  Headaches, Seizures -, Well Controlled,  PSYCHIATRIC DISORDERS Anxiety Depression Bipolar Disorder Schizophrenia    GI/Hepatic Neg liver ROS, GERD  ,  Endo/Other  negative endocrine ROSneg diabetes  Renal/GU negative Renal ROS     Musculoskeletal negative musculoskeletal ROS (+)   Abdominal (+) - obese,   Peds  Hematology negative hematology ROS (+)   Anesthesia Other Findings Past Medical History: No date: Anxiety No date: Bipolar disorder, unspecified (HCC) No date: Coarse tremors No date: COPD (chronic obstructive pulmonary disease) (HCC) No date: Depression No date: Diastolic dysfunction No date: Dysrhythmia No date: Essential hypertension, benign No date: Generalized anxiety disorder No date: GERD (gastroesophageal reflux disease) No date: Headache(784.0) No date: Physical deconditioning No date: Psoriasis No date: Rhabdomyolysis No date: Schizoaffective disorder, unspecified condition No date: Seizure disorder (HCC) No date: Seizures (HCC)     Comment:  NONE SINCE AGE 63 No date: Sepsis (HCC) No date: Shortness of breath     Comment:  W/ EXERTION    Reproductive/Obstetrics                              Anesthesia Physical  Anesthesia Plan  ASA: III  Anesthesia Plan: General   Post-op Pain Management:    Induction: Intravenous  PONV Risk Score and Plan: 0  Airway Management Planned: Mask  Additional Equipment:   Intra-op Plan:   Post-operative Plan:   Informed Consent: I have reviewed the patients History and Physical, chart, labs and discussed the procedure including the risks, benefits and alternatives for the proposed anesthesia with the patient or authorized representative who has indicated his/her understanding and acceptance.     Dental advisory given  Plan Discussed with: CRNA and Anesthesiologist  Anesthesia Plan Comments:         Anesthesia Quick Evaluation  

## 2018-11-20 NOTE — Anesthesia Post-op Follow-up Note (Signed)
Anesthesia QCDR form completed.        

## 2018-11-20 NOTE — Procedures (Signed)
ECT SERVICES Physician's Interval Evaluation & Treatment Note  Patient Identification: Jason Patterson MRN:  [id number] Date of Evaluation:  11/20/2018 TX #: 6  MADRS:27   MMSE: 30  P.E. Findings:  No change physical exam  Psychiatric Interval Note:  Chronic dysphoria irritability.  States suicidal thoughts but has no plan to act on it.  Appears to be taking care of himself much better.  Awake alert oriented and moving  Subjective:  Patient is a 63 y.o. male seen for evaluation for Electroconvulsive Therapy. Chronic complaints  Treatment Summary:   [x]   Right Unilateral             []  Bilateral   % Energy : 0.3 ms 100%   Impedance: 2020 ohms  Seizure Energy Index: 3424 V squared  Postictal Suppression Index: 87%  Seizure Concordance Index: 91%  Medications  Pre Shock: Robinul 0.1 mg Toradol 30 mg Brevital 70 mg succinylcholine 100 mg  Post Shock:    Seizure Duration: 19 seconds by foot movement 40 seconds by EEG   Comments: Follow-up in 2 weeks  Lungs:  [x]   Clear to auscultation               []  Other:   Heart:    [x]   Regular rhythm             []  irregular rhythm    [x]   Previous H&P reviewed, patient examined and there are NO CHANGES                 []   Previous H&P reviewed, patient examined and there are changes noted.   [name], MD 1/3/202011:06 AM

## 2018-11-20 NOTE — Discharge Instructions (Signed)
1)  The drugs that you have been given will stay in your system until tomorrow so for the       next 24 hours you should not:  A. Drive an automobile  B. Make any legal decisions  C. Drink any alcoholic beverages  2)  You may resume your regular meals upon return home.  3)  A responsible adult must take you home.  Someone should stay with you for a few          hours, then be available by phone for the remainder of the treatment day.  4)  You May experience any of the following symptoms:  Headache, Nausea and a dry mouth (due to the medications you were given),  temporary memory loss and some confusion, or sore muscles (a warm bath  should help this).  If you you experience any of these symptoms let us know on                your return visit.  5)  Report any of the following: any acute discomfort, severe headache, or temperature        greater than 100.5 F.   Also report any unusual redness, swelling, drainage, or pain         at your IV site.    You may report Symptoms to:  ECT PROGRAM- [place] [facility]          Phone: [phone], ECT Department           or Dr. Shary Key office [phone]  6)  Your next ECT Treatment is Friday January 17   We will call 2 days prior to your scheduled appointment for arrival times.  7)  Nothing to eat or drink after midnight the night before your procedure.  8)  Take   With a sip of water the morning of your procedure.  9)  Other Instructions: Call [phone] to cancel the morning of your procedure due         to illness or emergency.  10) We will call within 72 hours to assess how you are feeling.

## 2018-11-20 NOTE — Anesthesia Postprocedure Evaluation (Signed)
Anesthesia Post Note  Patient: Jason Patterson  Procedure(s) Performed: ECT TX  Patient location during evaluation: PACU Anesthesia Type: General Level of consciousness: awake and alert Pain management: pain level controlled Vital Signs Assessment: post-procedure vital signs reviewed and stable Respiratory status: spontaneous breathing, nonlabored ventilation and respiratory function stable Cardiovascular status: blood pressure returned to baseline and stable Postop Assessment: no signs of nausea or vomiting Anesthetic complications: no     Last Vitals:  Vitals:   11/20/18 1144 11/20/18 1152  BP: 124/82   Pulse:  80  Resp:    Temp: 36.5 C 36.5 C  SpO2:      Last Pain:  Vitals:   11/20/18 1152  TempSrc: Oral  PainSc:                  Aubry Tucholski

## 2018-11-27 ENCOUNTER — Other Ambulatory Visit: Payer: Self-pay

## 2018-11-27 ENCOUNTER — Emergency Department
Admission: EM | Admit: 2018-11-27 | Discharge: 2018-11-27 | Disposition: A | Discharge status: Home / Self Care | Payer: Medicaid Other | Attending: Student in an Organized Health Care Education/Training Program | Admitting: Student in an Organized Health Care Education/Training Program

## 2018-11-27 DIAGNOSIS — K219 Gastro-esophageal reflux disease without esophagitis: Secondary | ICD-10-CM | POA: Diagnosis present

## 2018-11-27 DIAGNOSIS — I10 Essential (primary) hypertension: Secondary | ICD-10-CM | POA: Diagnosis present

## 2018-11-27 DIAGNOSIS — N189 Chronic kidney disease, unspecified: Secondary | ICD-10-CM | POA: Insufficient documentation

## 2018-11-27 DIAGNOSIS — J449 Chronic obstructive pulmonary disease, unspecified: Secondary | ICD-10-CM | POA: Diagnosis not present

## 2018-11-27 DIAGNOSIS — Z7982 Long term (current) use of aspirin: Secondary | ICD-10-CM | POA: Diagnosis not present

## 2018-11-27 DIAGNOSIS — F17210 Nicotine dependence, cigarettes, uncomplicated: Secondary | ICD-10-CM | POA: Insufficient documentation

## 2018-11-27 DIAGNOSIS — F32A Depression, unspecified: Secondary | ICD-10-CM

## 2018-11-27 DIAGNOSIS — Z79899 Other long term (current) drug therapy: Secondary | ICD-10-CM | POA: Diagnosis not present

## 2018-11-27 DIAGNOSIS — I129 Hypertensive chronic kidney disease with stage 1 through stage 4 chronic kidney disease, or unspecified chronic kidney disease: Secondary | ICD-10-CM | POA: Diagnosis not present

## 2018-11-27 DIAGNOSIS — F419 Anxiety disorder, unspecified: Secondary | ICD-10-CM | POA: Insufficient documentation

## 2018-11-27 DIAGNOSIS — F329 Major depressive disorder, single episode, unspecified: Secondary | ICD-10-CM

## 2018-11-27 DIAGNOSIS — R45851 Suicidal ideations: Secondary | ICD-10-CM | POA: Diagnosis not present

## 2018-11-27 DIAGNOSIS — F259 Schizoaffective disorder, unspecified: Secondary | ICD-10-CM

## 2018-11-27 LAB — COMPREHENSIVE METABOLIC PANEL
ALBUMIN: 3.9 g/dL (ref 3.5–5.0)
ALT: 14 U/L (ref 0–44)
AST: 19 U/L (ref 15–41)
Alkaline Phosphatase: 72 U/L (ref 38–126)
Anion gap: 9 (ref 5–15)
BILIRUBIN TOTAL: 0.5 mg/dL (ref 0.3–1.2)
BUN: 14 mg/dL (ref 8–23)
CO2: 23 mmol/L (ref 22–32)
Calcium: 9.1 mg/dL (ref 8.9–10.3)
Chloride: 103 mmol/L (ref 98–111)
Creatinine, Ser: 1.05 mg/dL (ref 0.61–1.24)
GFR calc Af Amer: >60 mL/min (ref >60)
GFR calc non Af Amer: >60 mL/min (ref >60)
GLUCOSE: 97 mg/dL (ref 70–99)
Potassium: 3.6 mmol/L (ref 3.5–5.1)
Sodium: 135 mmol/L (ref 135–145)
Total Protein: 6.5 g/dL (ref 6.5–8.1)

## 2018-11-27 LAB — CBC
HCT: 43.4 % (ref 39.0–52.0)
HEMOGLOBIN: 14.8 g/dL (ref 13.0–17.0)
MCH: 30.8 pg (ref 26.0–34.0)
MCHC: 34.1 g/dL (ref 30.0–36.0)
MCV: 90.4 fL (ref 80.0–100.0)
Platelets: 212 10*3/uL (ref 150–400)
RBC: 4.8 MIL/uL (ref 4.22–5.81)
RDW: 15.8 % — ABNORMAL HIGH (ref 11.5–15.5)
WBC: 6.9 10*3/uL (ref 4.0–10.5)
nRBC: 0 % (ref 0.0–0.2)

## 2018-11-27 LAB — ACETAMINOPHEN LEVEL

## 2018-11-27 LAB — SALICYLATE LEVEL: Salicylate Lvl: <7 mg/dL (ref 2.8–30.0)

## 2018-11-27 LAB — ETHANOL: Alcohol, Ethyl (B): <10 mg/dL (ref <10)

## 2018-11-27 NOTE — ED Notes (Signed)
Misty Stanley called back to say owner is at ED awaiting pt. In front of ED.

## 2018-11-27 NOTE — ED Provider Notes (Signed)
Patient seen by Dr. Toni Amend with psychiatry. He did rescind the IVC. Will plan on discharging back to group home.    Jason Semen, MD 11/27/18 [phone]
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
## 2018-11-27 NOTE — ED Notes (Signed)
Called Misty Stanley back to tell her Act team would not be able to pick up patient until tomorrow.  Told Misty Stanley this was unacceptable and Misty Stanley asked could you not just keep him overnight.  Misty Stanley told this was not an option and that I would need to speak with owner, Misty Stanley continued to say she could not give me owners #.  Misty Stanley stated she would call owner and call this nurse back in 10 minutes.
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
## 2018-11-27 NOTE — ED Triage Notes (Signed)
He arrives today with his ACT [facility]  Pt reports suicidal ideation including taking a knife and cutting himself  Pt verbalizes that he has had suicidal thoughts fo a long time  - he reports seeing his doctor today and he prescribed him 5mg  ambien to help him sleep

## 2018-11-27 NOTE — ED Notes (Signed)
Pt provided with sprite per RN approval

## 2018-11-27 NOTE — ED Notes (Signed)
Pt. Going home via group home owner([place])

## 2018-11-27 NOTE — ED Notes (Signed)
BEHAVIORAL HEALTH ROUNDING Patient sleeping: No. Patient alert and oriented: yes Behavior appropriate: Yes.  ; If no, describe:  Nutrition and fluids offered: yes Toileting and hygiene offered: Yes  Sitter present: q15 minute observations and security  monitoring Law enforcement present: Yes  ODS  

## 2018-11-27 NOTE — ED Notes (Signed)
Misty StanleyLisa called back from group home and said owner was coming to ED to pick patient up.  Owner does not walk well and requesting if we can bring patient to her.  Request granted.  Pt. Changing into his own clothes.

## 2018-11-27 NOTE — ED Notes (Signed)

## 2018-11-27 NOTE — Discharge Instructions (Addendum)
Please seek medical attention and help for any thoughts about wanting to harm yourself, harm others, any concerning change in behavior, severe depression, inappropriate drug use or any other new or concerning symptoms. ° °

## 2018-11-27 NOTE — ED Notes (Signed)
Called Lisa at group home, stated she called act team and spoke to "Morgan who said they would be picking up patient.  I asked for Billys # at Act team she gave me the [phone].  Genevie Cheshire stated they could probably not pick up patient till morning.

## 2018-11-27 NOTE — ED Notes (Signed)
Lisa from group home called back and stated they had no means of picking patient up.  Group home informed we can call for taxi but they would need to pay taxi cab.  Misty Stanley said group home had no money to pay for taxi.  Informed Misty Stanley that EMS could be provided but would probably not covered with insurance.  Misty Stanley stated she was not authorized to give me the owners name or phone #.  Misty Stanley stated she would call owner and have them call this nurse.
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
## 2018-11-27 NOTE — ED Notes (Signed)
Called and spoke with [name] about pts pending discharge  - she reports that [facility] pick him up

## 2018-11-28 ENCOUNTER — Inpatient Hospital Stay
Admission: AD | Admit: 2018-11-28 | Discharge: 2018-12-04 | DRG: 885 | Disposition: A | Discharge status: Home / Self Care | Payer: Medicaid Other | Source: Intra-hospital | Attending: Psychiatry | Admitting: Psychiatry

## 2018-11-28 ENCOUNTER — Encounter: Payer: Self-pay | Admitting: Emergency Medicine

## 2018-11-28 ENCOUNTER — Other Ambulatory Visit: Payer: Self-pay

## 2018-11-28 ENCOUNTER — Emergency Department
Admission: EM | Admit: 2018-11-28 | Discharge: 2018-11-28 | Disposition: A | Discharge status: Other Acute Inpatient Hospital | Payer: Medicaid Other | Attending: Emergency Medicine | Admitting: Emergency Medicine

## 2018-11-28 DIAGNOSIS — Z888 Allergy status to other drugs, medicaments and biological substances status: Secondary | ICD-10-CM

## 2018-11-28 DIAGNOSIS — Z915 Personal history of self-harm: Secondary | ICD-10-CM | POA: Diagnosis not present

## 2018-11-28 DIAGNOSIS — Z79899 Other long term (current) drug therapy: Secondary | ICD-10-CM

## 2018-11-28 DIAGNOSIS — J449 Chronic obstructive pulmonary disease, unspecified: Secondary | ICD-10-CM | POA: Insufficient documentation

## 2018-11-28 DIAGNOSIS — Z7982 Long term (current) use of aspirin: Secondary | ICD-10-CM | POA: Insufficient documentation

## 2018-11-28 DIAGNOSIS — F17210 Nicotine dependence, cigarettes, uncomplicated: Secondary | ICD-10-CM | POA: Insufficient documentation

## 2018-11-28 DIAGNOSIS — L409 Psoriasis, unspecified: Secondary | ICD-10-CM | POA: Diagnosis present

## 2018-11-28 DIAGNOSIS — Z880 Allergy status to penicillin: Secondary | ICD-10-CM | POA: Diagnosis not present

## 2018-11-28 DIAGNOSIS — F259 Schizoaffective disorder, unspecified: Secondary | ICD-10-CM | POA: Diagnosis present

## 2018-11-28 DIAGNOSIS — I10 Essential (primary) hypertension: Secondary | ICD-10-CM | POA: Insufficient documentation

## 2018-11-28 DIAGNOSIS — F332 Major depressive disorder, recurrent severe without psychotic features: Secondary | ICD-10-CM | POA: Diagnosis present

## 2018-11-28 DIAGNOSIS — Z89422 Acquired absence of other left toe(s): Secondary | ICD-10-CM

## 2018-11-28 DIAGNOSIS — R45851 Suicidal ideations: Secondary | ICD-10-CM | POA: Insufficient documentation

## 2018-11-28 DIAGNOSIS — Z23 Encounter for immunization: Secondary | ICD-10-CM | POA: Diagnosis not present

## 2018-11-28 DIAGNOSIS — G40909 Epilepsy, unspecified, not intractable, without status epilepticus: Secondary | ICD-10-CM | POA: Diagnosis present

## 2018-11-28 DIAGNOSIS — G3184 Mild cognitive impairment, so stated: Secondary | ICD-10-CM | POA: Diagnosis present

## 2018-11-28 DIAGNOSIS — F250 Schizoaffective disorder, bipolar type: Secondary | ICD-10-CM | POA: Diagnosis present

## 2018-11-28 DIAGNOSIS — K219 Gastro-esophageal reflux disease without esophagitis: Secondary | ICD-10-CM | POA: Diagnosis present

## 2018-11-28 DIAGNOSIS — F411 Generalized anxiety disorder: Secondary | ICD-10-CM | POA: Diagnosis present

## 2018-11-28 DIAGNOSIS — F329 Major depressive disorder, single episode, unspecified: Secondary | ICD-10-CM | POA: Insufficient documentation

## 2018-11-28 LAB — CBC WITH DIFFERENTIAL/PLATELET
Abs Immature Granulocytes: 0.01 10*3/uL (ref 0.00–0.07)
Basophils Absolute: 0.1 10*3/uL (ref 0.0–0.1)
Basophils Relative: 1 %
EOS ABS: 0.1 10*3/uL (ref 0.0–0.5)
Eosinophils Relative: 1 %
HCT: 40.2 % (ref 39.0–52.0)
Hemoglobin: 13.6 g/dL (ref 13.0–17.0)
IMMATURE GRANULOCYTES: 0 %
Lymphocytes Relative: 29 %
Lymphs Abs: 1.8 10*3/uL (ref 0.7–4.0)
MCH: 30.5 pg (ref 26.0–34.0)
MCHC: 33.8 g/dL (ref 30.0–36.0)
MCV: 90.1 fL (ref 80.0–100.0)
Monocytes Absolute: 0.4 10*3/uL (ref 0.1–1.0)
Monocytes Relative: 7 %
NEUTROS PCT: 62 %
NRBC: 0 % (ref 0.0–0.2)
Neutro Abs: 3.9 10*3/uL (ref 1.7–7.7)
Platelets: 194 10*3/uL (ref 150–400)
RBC: 4.46 MIL/uL (ref 4.22–5.81)
RDW: 15.7 % — ABNORMAL HIGH (ref 11.5–15.5)
WBC: 6.3 10*3/uL (ref 4.0–10.5)

## 2018-11-28 LAB — COMPREHENSIVE METABOLIC PANEL
ALT: 16 U/L (ref 0–44)
AST: 17 U/L (ref 15–41)
Albumin: 3.9 g/dL (ref 3.5–5.0)
Alkaline Phosphatase: 62 U/L (ref 38–126)
Anion gap: 6 (ref 5–15)
BUN: 13 mg/dL (ref 8–23)
CO2: 21 mmol/L — ABNORMAL LOW (ref 22–32)
Calcium: 8.9 mg/dL (ref 8.9–10.3)
Chloride: 111 mmol/L (ref 98–111)
Creatinine, Ser: 0.84 mg/dL (ref 0.61–1.24)
GFR calc non Af Amer: >60 mL/min (ref >60)
Glucose, Bld: 108 mg/dL — ABNORMAL HIGH (ref 70–99)
Potassium: 4.1 mmol/L (ref 3.5–5.1)
Sodium: 138 mmol/L (ref 135–145)
Total Bilirubin: 0.5 mg/dL (ref 0.3–1.2)
Total Protein: 6.1 g/dL — ABNORMAL LOW (ref 6.5–8.1)

## 2018-11-28 LAB — ACETAMINOPHEN LEVEL: Acetaminophen (Tylenol), Serum: <10 ug/mL — ABNORMAL LOW (ref 10–30)

## 2018-11-28 LAB — SALICYLATE LEVEL: Salicylate Lvl: <7 mg/dL (ref 2.8–30.0)

## 2018-11-28 MED ORDER — METOPROLOL TARTRATE 25 MG PO TABS
12.5000 mg | ORAL_TABLET | Freq: Two times a day (BID) | ORAL | Status: DC
  Administered 2018-11-28: 12.5 mg via ORAL
  Filled 2018-11-28: qty 1

## 2018-11-28 MED ORDER — TETANUS-DIPHTH-ACELL PERTUSSIS 5-2.5-18.5 LF-MCG/0.5 IM SUSP
0.5000 mL | Freq: Once | INTRAMUSCULAR | Status: AC
  Administered 2018-11-28: 0.5 mL via INTRAMUSCULAR
  Filled 2018-11-28: qty 0.5

## 2018-11-28 MED ORDER — ALBUTEROL SULFATE HFA 108 (90 BASE) MCG/ACT IN AERS
1.0000 | INHALATION_SPRAY | Freq: Four times a day (QID) | RESPIRATORY_TRACT | Status: DC | PRN
  Filled 2018-11-28: qty 6.7

## 2018-11-28 MED ORDER — LEVETIRACETAM 500 MG PO TABS
1000.0000 mg | ORAL_TABLET | Freq: Two times a day (BID) | ORAL | Status: DC
  Administered 2018-11-28: 1000 mg via ORAL
  Filled 2018-11-28: qty 2

## 2018-11-28 MED ORDER — LATANOPROST 0.005 % OP SOLN: 1.0000 [drp] | Freq: Every day | OPHTHALMIC | Status: DC

## 2018-11-28 MED ORDER — LOXAPINE SUCCINATE 5 MG PO CAPS
25.0000 mg | ORAL_CAPSULE | Freq: Three times a day (TID) | ORAL | Status: DC
  Administered 2018-11-28: 25 mg via ORAL
  Filled 2018-11-28: qty 1
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
## 2018-11-28 NOTE — ED Notes (Signed)
Pt moved to [name] RN aware

## 2018-11-28 NOTE — ED Notes (Signed)
This RN spoke with Morey Hummingbird of [employer] [place], [place] at [phone] and informed her of patients admission to the in patient unit.

## 2018-11-28 NOTE — BH Assessment (Signed)
Patient is to be admitted to [facility] BMU by Dr. Leighton Roach.  Attending Physician will be Dr. Jennet Maduro.   Patient has been assigned to room 306, by [facility] Charge Nurse [name].   Intake Paper Work has been signed and placed on patient chart.  ER staff is aware of the admission:  [name], ER Secretary    Dr. Mayford Knife, ER MD   [name], Patient's Nurse   [name], Patient Access.   Writer spoke with patient's guardian, [name]) and she gave verbal consent for him to be admitted to [facility] BMU.
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
## 2018-11-29 DIAGNOSIS — F332 Major depressive disorder, recurrent severe without psychotic features: Secondary | ICD-10-CM | POA: Diagnosis present

## 2018-11-29 DIAGNOSIS — F259 Schizoaffective disorder, unspecified: Principal | ICD-10-CM

## 2018-11-29 MED ORDER — FAMOTIDINE 20 MG PO TABS
10.0000 mg | ORAL_TABLET | Freq: Two times a day (BID) | ORAL | Status: DC
  Administered 2018-11-29 – 2018-12-03 (×10): 10 mg via ORAL
  Filled 2018-11-29 (×10): qty 1

## 2018-11-29 MED ORDER — LOXAPINE SUCCINATE 5 MG PO CAPS
25.0000 mg | ORAL_CAPSULE | Freq: Three times a day (TID) | ORAL | Status: DC
  Administered 2018-11-29 – 2018-12-04 (×15): 25 mg via ORAL
  Filled 2018-11-29 (×16): qty 1

## 2018-11-29 MED ORDER — B COMPLEX-C PO TABS
1.0000 | ORAL_TABLET | Freq: Every day | ORAL | Status: DC
  Administered 2018-11-29 – 2018-12-04 (×6): 1 via ORAL
  Filled 2018-11-29 (×6): qty 1

## 2018-11-29 MED ORDER — ALUM & MAG HYDROXIDE-SIMETH 200-200-20 MG/5ML PO SUSP: 30.0000 mL | ORAL | Status: DC | PRN

## 2018-11-29 MED ORDER — MAGNESIUM HYDROXIDE 400 MG/5ML PO SUSP: 30.0000 mL | Freq: Every day | ORAL | Status: DC | PRN

## 2018-11-29 MED ORDER — SERTRALINE HCL 25 MG PO TABS
25.0000 mg | ORAL_TABLET | Freq: Every day | ORAL | Status: DC
  Administered 2018-11-29 – 2018-11-30 (×2): 25 mg via ORAL
  Filled 2018-11-29 (×2): qty 1

## 2018-11-29 MED ORDER — VITAMIN D3 25 MCG (1000 UNIT) PO TABS
2000.0000 [IU] | ORAL_TABLET | Freq: Every day | ORAL | Status: DC
  Administered 2018-11-29 – 2018-12-04 (×6): 2000 [IU] via ORAL
  Filled 2018-11-29 (×11): qty 2

## 2018-11-29 MED ORDER — ACETAMINOPHEN 325 MG PO TABS: 650.0000 mg | ORAL_TABLET | Freq: Four times a day (QID) | ORAL | Status: DC | PRN

## 2018-11-29 MED ORDER — TAMSULOSIN HCL 0.4 MG PO CAPS
0.4000 mg | ORAL_CAPSULE | Freq: Every day | ORAL | Status: DC
  Administered 2018-11-29 – 2018-12-02 (×3): 0.4 mg via ORAL
  Filled 2018-11-29 (×5): qty 1

## 2018-11-29 MED ORDER — METOPROLOL TARTRATE 25 MG PO TABS
12.5000 mg | ORAL_TABLET | Freq: Two times a day (BID) | ORAL | Status: DC
  Administered 2018-11-29 – 2018-12-04 (×10): 12.5 mg via ORAL
  Filled 2018-11-29 (×11): qty 1

## 2018-11-29 MED ORDER — ASPIRIN EC 81 MG PO TBEC
81.0000 mg | DELAYED_RELEASE_TABLET | Freq: Every day | ORAL | Status: DC
  Administered 2018-11-29 – 2018-12-04 (×6): 81 mg via ORAL
  Filled 2018-11-29 (×6): qty 1

## 2018-11-29 MED ORDER — LATANOPROST 0.005 % OP SOLN
1.0000 [drp] | Freq: Every day | OPHTHALMIC | Status: DC
  Administered 2018-11-29 – 2018-12-03 (×5): 1 [drp] via OPHTHALMIC
  Filled 2018-11-29: qty 2.5

## 2018-11-29 MED ORDER — ALBUTEROL SULFATE HFA 108 (90 BASE) MCG/ACT IN AERS
1.0000 | INHALATION_SPRAY | Freq: Four times a day (QID) | RESPIRATORY_TRACT | Status: DC | PRN
  Filled 2018-11-29: qty 6.7

## 2018-11-29 MED ORDER — LEVETIRACETAM 500 MG PO TABS
1000.0000 mg | ORAL_TABLET | Freq: Two times a day (BID) | ORAL | Status: DC
  Administered 2018-11-29 – 2018-12-03 (×10): 1000 mg via ORAL
  Filled 2018-11-29 (×10): qty 2

## 2018-11-29 NOTE — Progress Notes (Signed)
D: Appears anxious and sad. Rates depression 10/10. Rates anxiety 7/10. Reports intermittent suicidal thoughts without plan or intent. A: Will continue q15 minute checks and offer support. R. Patient contracting for safety on the unit. Safety maintained

## 2018-11-29 NOTE — BHH Group Notes (Signed)
LCSW Group Therapy Note 11/29/2018 1:15pm  Type of Therapy and Topic: Group Therapy: Feelings Around Returning Home & Establishing a Supportive Framework and Supporting Oneself When Supports Not Available  Participation Level: Active  Description of Group:  Patients first processed thoughts and feelings about upcoming discharge. These included fears of upcoming changes, lack of change, new living environments, judgements and expectations from others and overall stigma of mental health issues. The group then discussed the definition of a supportive framework, what that looks and feels like, and how do to discern it from an unhealthy non-supportive network. The group identified different types of supports as well as what to do when your family/friends are less than helpful or unavailable  Therapeutic Goals  1. Patient will identify one healthy supportive network that they can use at discharge. 2. Patient will identify one factor of a supportive framework and how to tell it from an unhealthy network. 3. Patient able to identify one coping skill to use when they do not have positive supports from others. 4. Patient will demonstrate ability to communicate their needs through discussion and/or role plays.  Summary of Patient Progress:  The patient reported he feels "alright." Pt engaged during group session. As patients processed their anxiety about discharge and described healthy supports patient shared he is not ready to be discharge. He stated "they are trying new meds on me."  Patients identified at least one self-care tool they were willing to use after discharge.   Therapeutic Modalities Cognitive Behavioral Therapy Motivational Interviewing   Deneene Tarver  CUEBAS-COLON, LCSW 11/29/2018 9:56 AM

## 2018-11-29 NOTE — Progress Notes (Addendum)
New admit from a group home voluntary committed to this unit, patient present due to attempted suicide by cutting his left  wrist , he states that he is upset about his group home and the staffs that work there , patient appear calm and pleasant during this assessment and denies any SI/HI/AVH and verbally  contract for safety of self and others. Patient is stable alert and oriented x4 denies any pain, unit guide lines and expected behaviors are discussed , cold tray and beverages is provided , hygiene product is provided, room and unit orientation is complete , body and skin search is done with two nurses, no contraband found and skin is clean, Room is within eyesight from the nurses station for frequent monitoring , patient is reminded 15 minutes safety rounding is maintained no distress.

## 2018-11-29 NOTE — Plan of Care (Signed)
Calm and cooperative. Compliant with treatment.  
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
## 2018-11-29 NOTE — Tx Team (Signed)
Initial Treatment Plan 11/29/2018 12:52 AM Hector Shade HAF:[id number]    PATIENT STRESSORS: Financial difficulties Medication change or noncompliance Occupational concerns   PATIENT STRENGTHS: Average or above average intelligence Capable of independent living Motivation for treatment/growth   PATIENT IDENTIFIED PROBLEMS: Suicide Ideations     Depression /Anxiety    Financial problems    Lack confidence in himself         DISCHARGE CRITERIA:  Ability to meet basic life and health needs Improved stabilization in mood, thinking, and/or behavior Motivation to continue treatment in a less acute level of care Safe-care adequate arrangements made  PRELIMINARY DISCHARGE PLAN: Attend PHP/IOP Attend 12-step recovery group Placement in alternative living arrangements  PATIENT/FAMILY INVOLVEMENT: This treatment plan has been presented to and reviewed with the patient, Hector Shade,.  The patient  have been given the opportunity to ask questions and make suggestions.  Lelan Pons, RN 11/29/2018, 12:52 AM

## 2018-11-29 NOTE — Plan of Care (Signed)
D: Patient appears sad and anxious. Interacting appropriately with staff and peers. Reports intermittent suicidal thoughts without plan or intent. A: Continue to monitor for safety and offer support. R:Contracting for safety on the unit.
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
## 2018-11-30 LAB — HEMOGLOBIN A1C
Hgb A1c MFr Bld: 5.5 % (ref 4.8–5.6)
Mean Plasma Glucose: 111.15 mg/dL

## 2018-11-30 LAB — LIPID PANEL
Cholesterol: 104 mg/dL (ref 0–200)
HDL: 42 mg/dL (ref >40)
LDL Cholesterol: 50 mg/dL (ref 0–99)
TRIGLYCERIDES: 62 mg/dL (ref <150)
Total CHOL/HDL Ratio: 2.5 RATIO
VLDL: 12 mg/dL (ref 0–40)

## 2018-11-30 LAB — TSH: TSH: 0.539 u[IU]/mL (ref 0.350–4.500)

## 2018-11-30 MED ORDER — OLANZAPINE 5 MG PO TABS
15.0000 mg | ORAL_TABLET | Freq: Every day | ORAL | Status: DC
  Administered 2018-11-30 – 2018-12-03 (×4): 15 mg via ORAL
  Filled 2018-11-30 (×4): qty 1

## 2018-11-30 MED ORDER — SERTRALINE HCL 100 MG PO TABS
100.0000 mg | ORAL_TABLET | Freq: Every day | ORAL | Status: DC
  Administered 2018-12-01 – 2018-12-04 (×4): 100 mg via ORAL
  Filled 2018-11-30 (×4): qty 1
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
## 2018-11-30 NOTE — Plan of Care (Signed)
Patient visible in the milieu,watching TV.Verbalized that he feels like hurting himself at times.Cooperative and appropriate in the unit.Compliant with medications.Appetite and energy level good.Attended groups.Denies AVH.Support and encouragement given.

## 2018-11-30 NOTE — Progress Notes (Signed)
Recreation Therapy Notes   Date: 11/30/2018  Time: 9:30 am   Location: Craft room   Behavioral response: N/A   Intervention Topic: Emotions  Discussion/Intervention: Patient did not attend group.   Clinical Observations/Feedback:  Patient did not attend group.   Satoria Dunlop LRT/CTRS         Melea Prezioso 11/30/2018 10:47 AM 

## 2018-11-30 NOTE — Tx Team (Addendum)
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 acetaminophen (TYLENOL) tablet 650 mg  650 mg Oral Q6H PRN Whitman Hero, MD      . albuterol (PROVENTIL HFA;VENTOLIN HFA) 108 (90 Base) MCG/ACT inhaler 1 puff  1 puff Inhalation Q6H PRN Whitman Hero, MD      . alum & mag hydroxide-simeth (MAALOX/MYLANTA) 200-200-20 MG/5ML suspension 30 mL  30 mL Oral Q4H PRN Whitman Hero, MD      . aspirin EC tablet 81 mg  81 mg Oral Daily Whitman Hero, MD   81 mg at 11/30/18 0757  . B-complex with vitamin C tablet 1 tablet  1 tablet Oral Daily Whitman Hero, MD   1 tablet at 11/30/18 0758  . cholecalciferol (VITAMIN D) tablet 2,000 Units  2,000 Units Oral Daily Whitman Hero, MD   2,000 Units at 11/30/18 0757  . famotidine (PEPCID) tablet 10 mg  10 mg Oral BID Whitman Hero, MD   10 mg at 11/30/18 0757  . latanoprost (XALATAN) 0.005 % ophthalmic solution 1 drop  1 drop Both Eyes QHS Whitman Hero, MD   1 drop at 11/29/18 2151  . levETIRAcetam (KEPPRA) tablet 1,000 mg  1,000 mg Oral BID Whitman Hero, MD   1,000 mg at 11/30/18 0757  . loxapine (LOXITANE) capsule 25 mg  25 mg Oral TID Whitman Hero, MD   25 mg at 11/30/18 1156  . magnesium hydroxide (MILK OF MAGNESIA) suspension 30 mL  30 mL Oral Daily PRN Whitman Hero, MD      . metoprolol tartrate (LOPRESSOR) tablet 12.5 mg  12.5 mg Oral BID Whitman Hero, MD   12.5 mg at 11/30/18 0757  . sertraline (ZOLOFT) tablet 25 mg  25 mg Oral Daily Lenward Chancellor, MD   25 mg at 11/30/18 0758  .  tamsulosin (FLOMAX) capsule 0.4 mg  0.4 mg Oral Daily Whitman Hero, MD   0.4 mg at 11/30/18 8144   PTA Medications: Medications Prior to Admission  Medication Sig Dispense Refill Last Dose  . albuterol (PROAIR HFA) 108 (90 Base) MCG/ACT inhaler Inhale 1 puff into the lungs every 6 (six) hours as needed for wheezing or shortness of breath. 1 Inhaler 1 11/19/2018 at Unknown time  . aspirin EC 81 MG tablet Take 1 tablet (81 mg total) by mouth daily. 30 tablet 1 11/19/2018 at Unknown time  . b complex vitamins capsule Take 1 capsule by mouth daily.   11/05/2018  . cholecalciferol (VITAMIN D) 25 MCG (1000 UT) tablet Take 2 tablets (2,000 Units total) by mouth daily. 60 tablet 1 11/19/2018 at Unknown time  . famotidine (PEPCID) 10 MG tablet Take 1 tablet (10 mg total) by mouth 2 (two) times daily. 60 tablet 1 11/19/2018 at Unknown time  . latanoprost (XALATAN) 0.005 % ophthalmic solution Place 1 drop into both eyes at bedtime. 2.5 mL 1 11/19/2018 at Unknown time  . levETIRAcetam (KEPPRA) 500 MG tablet Take 2 tablets (1,000 mg total) by mouth 2 (two) times daily. 120 tablet 1 11/19/2018 at Unknown time  . loxapine (LOXITANE) 25 MG capsule Take 1 capsule (25 mg total) by mouth 3 (three) times daily. [place]  capsule 1 11/19/2018 at Unknown time  . metoprolol tartrate (LOPRESSOR) 25 MG tablet Take 0.5 tablets (12.5 mg total) by mouth 2 (two) times daily. 60 tablet 1 11/19/2018 at Unknown time  . Multiple Vitamin (MULTIVITAMIN WITH MINERALS) TABS tablet Take 1 tablet by mouth daily. 30 tablet 1 11/19/2018 at Unknown time  . OLANZapine (ZYPREXA) 20 MG tablet Take 1 tablet (20 mg total) by mouth 2 (two) times daily. 60 tablet 1 11/19/2018 at Unknown time  . polyethylene glycol (MIRALAX / GLYCOLAX) packet Take 17 g by mouth 2 (two) times daily. 60 each 1 11/19/2018 at Unknown time  . senna (SENOKOT) 8.6 MG TABS tablet Take 1 tablet (8.6 mg total) by mouth daily as needed for mild constipation. 30 each 1 11/19/2018 at Unknown time  .  tamsulosin (FLOMAX) 0.4 MG CAPS capsule Take 1 capsule (0.4 mg total) by mouth daily. 30 capsule 1 11/19/2018 at Unknown time  . traZODone (DESYREL) 100 MG tablet Take 1 tablet (100 mg total) by mouth at bedtime. 30 tablet 1 11/19/2018 at Unknown time    Patient Stressors: Financial difficulties Medication change or noncompliance Occupational concerns  Patient Strengths: Average or above average intelligence Capable of independent living Motivation for treatment/growth  Treatment Modalities: Medication Management, Group therapy, Case management,  1 to 1 session with clinician, Psychoeducation, Recreational therapy.   Physician Treatment Plan for Primary Diagnosis: Schizoaffective disorder, unspecified type ([place]) Long Term Goal(s): Improvement in symptoms so as ready for discharge Improvement in symptoms so as ready for discharge   Short Term Goals: Ability to identify changes in lifestyle to reduce recurrence of condition will improve Ability to verbalize feelings will improve Ability to disclose and discuss suicidal ideas Ability to demonstrate self-control will improve Ability to identify and develop effective coping behaviors will improve Ability to maintain clinical measurements within normal limits will improve Compliance with prescribed medications will improve Ability to identify triggers associated with substance abuse/mental health issues will improve Ability to identify changes in lifestyle to reduce recurrence of condition will improve Ability to verbalize feelings will improve Ability to disclose and discuss suicidal ideas Ability to demonstrate self-control will improve Ability to identify and develop effective coping behaviors will improve Ability to maintain clinical measurements within normal limits will improve Compliance with prescribed medications will improve Ability to identify triggers associated with substance abuse/mental health issues will improve  Medication  Management: Evaluate patient's response, side effects, and tolerance of medication regimen.  Therapeutic Interventions: 1 to 1 sessions, Unit Group sessions and Medication administration.  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 ([place])  Long Term Goal(s): Improvement in symptoms so as ready for discharge Improvement in symptoms so as ready for discharge   Short Term Goals: Ability to identify changes in lifestyle to reduce recurrence of condition will improve Ability to verbalize feelings will improve Ability to disclose and discuss suicidal ideas Ability to demonstrate self-control will improve Ability to identify and develop effective coping behaviors will improve Ability to maintain clinical measurements within normal limits will improve Compliance with prescribed medications will improve Ability to identify triggers associated with substance abuse/mental health issues will improve Ability to identify changes in lifestyle to reduce recurrence of condition will improve Ability to verbalize feelings will improve Ability to disclose and discuss suicidal ideas Ability to demonstrate self-control will improve Ability to identify and  develop effective coping behaviors will improve Ability to maintain clinical measurements within normal limits will improve Compliance with prescribed medications will improve Ability to identify triggers associated with substance abuse/mental health issues will improve     Medication Management: Evaluate patient's response, side effects, and tolerance of medication regimen.  Therapeutic Interventions: 1 to 1 sessions, Unit Group sessions and Medication administration.  Evaluation of Outcomes: Not Met   RN Treatment Plan for Primary Diagnosis:  Schizoaffective disorder, unspecified type ([place]) Long Term Goal(s): Knowledge of disease and therapeutic regimen to maintain health will improve  Short Term Goals: Ability to participate in decision making will improve, Ability to verbalize feelings will improve, Ability to disclose and discuss suicidal ideas, Ability to identify and develop effective coping behaviors will improve and Compliance with prescribed medications will improve  Medication Management: RN will administer medications as ordered by provider, will assess and evaluate patient's response and provide education to patient for prescribed medication. RN will report any adverse and/or side effects to prescribing provider.  Therapeutic Interventions: 1 on 1 counseling sessions, Psychoeducation, Medication administration, Evaluate responses to treatment, Monitor vital signs and CBGs as ordered, Perform/monitor CIWA, COWS, AIMS and Fall Risk screenings as ordered, Perform wound care treatments as ordered.  Evaluation of Outcomes: Not Met   LCSW Treatment Plan for Primary Diagnosis: Schizoaffective disorder, unspecified type ([place]) Long Term Goal(s): Safe transition to appropriate next level of care at discharge, Engage patient in therapeutic group addressing interpersonal concerns.  Short Term Goals: Engage patient in aftercare planning with referrals and resources  Therapeutic Interventions: Assess for all discharge needs, 1 to 1 time with Social worker, Explore available resources and support systems, Assess for adequacy in community support network, Educate family and significant other(s) on suicide prevention, Complete Psychosocial Assessment, Interpersonal group therapy.  Evaluation of Outcomes: Not Met   Progress in Treatment: Attending groups: Yes. Participating in groups: Yes. Taking medication as prescribed: Yes. Toleration medication: Yes. Family/Significant other contact made: No, will contact:  Celene Skeen, legal  guardian Patient understands diagnosis: No. Discussing patient identified problems/goals with staff: Yes. Medical problems stabilized or resolved: Yes. Denies suicidal/homicidal ideation: Yes. Issues/concerns per patient self-inventory: Yes. Pt would like to change group home. Other: NA  New problem(s) identified: Yes, Describe:  Pt would like to change group home  New Short Term/Long Term Goal(s): "wants a new group home"  Patient Goals:  "wants a new group home"  Discharge Plan or Barriers: Pt will return home and follow up with outpatient treatment  Reason for Continuation of Hospitalization: Medication stabilization  Estimated Length of Stay: 5-7 days  Recreational Therapy: Patient Stressors: N/A Patient Goal: Patient will engage in groups without prompting or encouragement from LRT x3 group sessions within 5 recreation therapy group sessions  Date: 03/12/2018  Attendees: Patient:Jason Patterson 11/30/2018 3:43 PM  Physician: Alethia Berthold, MD 11/30/2018 3:43 PM  Nursing:  11/30/2018 3:43 PM  RN Care Manager: 11/30/2018 3:43 PM  Social Worker: Sanjuana Kava LCSW Assunta Curtis LCSW 11/30/2018 3:43 PM  Recreational Therapist: Roanna Epley LRT 11/30/2018 3:43 PM  Other:  11/30/2018 3:43 PM  Other:  11/30/2018 3:43 PM  Other: 11/30/2018 3:43 PM    Scribe for Treatment Team: Yvette Rack, LCSW 11/30/2018 3:43 PM

## 2018-11-30 NOTE — [facility] Group Notes (Signed)
LCSW Group Therapy Note   11/30/2018 1:00 PM  Type of Therapy and Topic:  Group Therapy:  Overcoming Obstacles   Participation Level:  Did Not Attend   Description of Group:    In this group patients will be encouraged to explore what they see as obstacles to their own wellness and recovery. They will be guided to discuss their thoughts, feelings, and behaviors related to these obstacles. The group will process together ways to cope with barriers, with attention given to specific choices patients can make. Each patient will be challenged to identify changes they are motivated to make in order to overcome their obstacles. This group will be process-oriented, with patients participating in exploration of their own experiences as well as giving and receiving support and challenge from other group members.   Therapeutic Goals: 1. Patient will identify personal and current obstacles as they relate to admission. 2. Patient will identify barriers that currently interfere with their wellness or overcoming obstacles.  3. Patient will identify feelings, thought process and behaviors related to these barriers. 4. Patient will identify two changes they are willing to make to overcome these obstacles:      Summary of Patient Progress   Patient did not attend.    Therapeutic Modalities:   Cognitive Behavioral Therapy Solution Focused Therapy Motivational Interviewing Relapse Prevention Therapy  Penni Homans, MSW, LCSW 11/30/2018 3:47 PM

## 2018-11-30 NOTE — [facility] Group Notes (Signed)
[facility] Group Notes:  (Nursing/MHT/Case Management/Adjunct)  Date:  11/30/2018  Time:  9:57 PM  Type of Therapy:  Group Therapy  Participation Level:  Minimal  Participation Quality:  Appropriate  Affect:  Appropriate  Cognitive:  Alert  Insight:  Good  Engagement in Group:  Engaged  Modes of Intervention:  Support  Summary of Progress/Problems:  Jason Patterson 11/30/2018, 9:57 PM

## 2018-12-01 ENCOUNTER — Other Ambulatory Visit: Payer: Self-pay | Admitting: Psychiatry

## 2018-12-01 NOTE — Progress Notes (Signed)
Patient's BP was 95/69, so this writer did not administer his evening BP medication.

## 2018-12-01 NOTE — [facility] Group Notes (Signed)
Feelings Around Diagnosis 12/01/2018 1PM  Type of Therapy/Topic:  Group Therapy:  Feelings about Diagnosis  Participation Level:  Did Not Attend   Description of Group:   This group will allow patients to explore their thoughts and feelings about diagnoses they have received. Patients will be guided to explore their level of understanding and acceptance of these diagnoses. Facilitator will encourage patients to process their thoughts and feelings about the reactions of others to their diagnosis and will guide patients in identifying ways to discuss their diagnosis with significant others in their lives. This group will be process-oriented, with patients participating in exploration of their own experiences, giving and receiving support, and processing challenge from other group members.   Therapeutic Goals: 1. Patient will demonstrate understanding of diagnosis as evidenced by identifying two or more symptoms of the disorder 2. Patient will be able to express two feelings regarding the diagnosis 3. Patient will demonstrate their ability to communicate their needs through discussion and/or role play  Summary of Patient Progress:       Therapeutic Modalities:   Cognitive Behavioral Therapy Brief Therapy Feelings Identification    Suzan Slick, LCSW 12/01/2018 2:00 PM

## 2018-12-01 NOTE — Plan of Care (Signed)
D- Patient alert and oriented. Patient presents in a pleasant, but depressed mood on assessment stating that he slept poor last night and did not request anything to help him sleep. Patient endorses passive SI stating to this writer "not in here". Patient endorses depression and anxiety, rating them a "8/10" and "7/10" stating that "I'm depressed all the time" and he doesn't know why he's anxious. Patient denies HI, AVH, and pain at this time. Patient had no stated goals for today.  A- Scheduled medications administered to patient, per MD orders. Support and encouragement provided.  Routine safety checks conducted every 15 minutes.  Patient informed to notify staff with problems or concerns.  R- No adverse drug reactions noted. Patient contracts for safety at this time. Patient compliant with medications and treatment plan. Patient receptive, calm, and cooperative. Patient interacts well with others on the unit.  Patient remains safe at this time.   Problem: Coping: Goal: Coping ability will improve Outcome: Progressing   Problem: Self-Concept: Goal: Ability to disclose and discuss suicidal ideas will improve Outcome: Progressing Goal: Will verbalize positive feelings about self Outcome: Progressing   Problem: Education: Goal: Utilization of techniques to improve thought processes will improve Outcome: Progressing Goal: Knowledge of the prescribed therapeutic regimen will improve Outcome: Progressing   Problem: Coping: Goal: Coping ability will improve Outcome: Progressing Goal: Will verbalize feelings Outcome: Progressing   Problem: Health Behavior/Discharge Planning: Goal: Identification of resources available to assist in meeting health care needs will improve Outcome: Progressing   Problem: Self-Concept: Goal: Will verbalize positive feelings about self Outcome: Progressing

## 2018-12-01 NOTE — Progress Notes (Signed)
Recreation Therapy Notes  INPATIENT RECREATION THERAPY ASSESSMENT  Patient Details Name: Jason Patterson MRN: [id number] DOB: [birth year] Today's Date: 12/01/2018       Information Obtained From: Patient  Able to Participate in Assessment/Interview: Yes  Patient Presentation: Responsive  Reason for Admission (Per Patient): Active Symptoms, Suicidal Ideation  Patient Stressors:    Coping Skills:   Art, Read  Leisure Interests (2+):  Art - Draw, Art - Coloring, Individual - Reading  Frequency of Recreation/Participation: Weekly  Awareness of Community Resources:     [employer] Resources:     Current Use:    If no, Barriers?:    Expressed Interest in [employer] CorporationCommunity Resource Information:    [place]  Patient Main Form of Transportation: Other (Comment)(Group home)  Patient Strengths:  N/A  Patient Identified Areas of Improvement:  N/A  Patient Goal for Hospitalization:  To go to a new group home  Current SI (including self-harm):  No  Current HI:  No  Current AVH: No  Staff Intervention Plan: Group Attendance, Collaborate with Interdisciplinary Treatment Team  Consent to Intern Participation: N/A  Ali Mclaurin 12/01/2018, 2:02 PM

## 2018-12-01 NOTE — Progress Notes (Signed)
Patient is calm and isolative in his room, with out any issues, comp[liance with his medication , contract for safety and attending groups with minimal participations, no side effects from medications , affect is good and mood is congruent with affect, sleep is continuous with no sleep aid, denies any SI/HI/AVH , 15 minute rounding for safety is maintained no distress.

## 2018-12-01 NOTE — Progress Notes (Signed)
Recreation Therapy Notes  Date:12/01/2018  Time:9:30 am  Location:Craft room  Behavioral response:N/A  Intervention Topic: Goals  Discussion/Intervention: Patient did not attend group.  Clinical Observations/Feedback:  Patient did not attend group.  Jamilette Suchocki LRT/CTRS        Preslynn Bier 12/01/2018 10:40 AM
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
## 2018-12-02 ENCOUNTER — Inpatient Hospital Stay: Payer: Medicaid Other | Admitting: Anesthesiology

## 2018-12-02 MED ORDER — METHOHEXITAL SODIUM 100 MG/10ML IV SOSY
PREFILLED_SYRINGE | INTRAVENOUS | Status: DC | PRN
  Administered 2018-12-02: 70 mg via INTRAVENOUS

## 2018-12-02 MED ORDER — SUCCINYLCHOLINE CHLORIDE 20 MG/ML IJ SOLN
INTRAMUSCULAR | Status: AC
  Filled 2018-12-02: qty 1

## 2018-12-02 MED ORDER — SODIUM CHLORIDE 0.9 % IV SOLN
INTRAVENOUS | Status: DC | PRN
  Administered 2018-12-02: 10:00:00 via INTRAVENOUS

## 2018-12-02 MED ORDER — SUCCINYLCHOLINE CHLORIDE 20 MG/ML IJ SOLN
INTRAMUSCULAR | Status: DC | PRN
  Administered 2018-12-02: 90 mg via INTRAVENOUS

## 2018-12-02 MED ORDER — SODIUM CHLORIDE 0.9 % IV SOLN
500.0000 mL | Freq: Once | INTRAVENOUS | Status: AC
  Administered 2018-12-02: 500 mL via INTRAVENOUS

## 2018-12-02 NOTE — Anesthesia Post-op Follow-up Note (Signed)
Anesthesia QCDR form completed.        

## 2018-12-02 NOTE — [facility] Suicide Risk Assessment (Signed)
[facility] INPATIENT:  Family/Significant Other Suicide Prevention Education  Suicide Prevention Education:  Education Completed; Marisa Severin, [employer] employee [phone] has been identified by the patient as the family member/significant other with whom the patient will be residing, and identified as the person(s) who will aid the patient in the event of a mental health crisis (suicidal ideations/suicide attempt).  With written consent from the patient, the family member/significant other has been provided the following suicide prevention education, prior to the and/or following the discharge of the patient.  The suicide prevention education provided includes the following:  Suicide risk factors  Suicide prevention and interventions  National Suicide Hotline telephone number  [facility] assessment telephone number  [facility] Emergency Assistance 911  [facility] and/or Residential Mobile Crisis Unit telephone number  Request made of family/significant other to:  Remove weapons (e.g., guns, rifles, knives), all items previously/currently identified as safety concern.    Remove drugs/medications (over-the-counter, prescriptions, illicit drugs), all items previously/currently identified as a safety concern.  The family member/significant other verbalizes understanding of the suicide prevention education information provided.  The family member/significant other agrees to remove the items of safety concern listed above. Ms. Sallyanne Havers reports pt will be allowed to return to the home and they will assist with transportation upon discharge. She states pt has no access to guns or weapons in the home and gets along with staff and residents. CSW informed her if pt needs further medical attention to call 911 or bring him to the nearest emergency department.  Deron Poole T Angellynn Kimberlin 12/02/2018, 9:16 AM

## 2018-12-02 NOTE — Progress Notes (Signed)
Recreation Therapy Notes   Date: 12/02/2018  Time: 9:30 am   Location: Craft room   Behavioral response: N/A   Intervention Topic: Coping  Discussion/Intervention: Patient did not attend group.   Clinical Observations/Feedback:  Patient did not attend group.   Jourdyn Ferrin LRT/CTRS         Evan Osburn 12/02/2018 12:29 PM

## 2018-12-02 NOTE — Plan of Care (Signed)
D: Patient went to ECT. Returned A&Ox4. Ate lunch. Attended group. Affect is brighter. Denies SI. Contracts for safety.  A: Continue to monitor and offer support. R: Safety maintained.

## 2018-12-02 NOTE — Progress Notes (Signed)
Patient is alert and oriented x 4 denies SI/HI/AVH affect is flat but brightens upon approach, he makes appropriate eye contact, interacting appropriately with peers and staff, cpmplaint withb medication and receptive to treatment care

## 2018-12-02 NOTE — Progress Notes (Signed)
D: Patient returned from ECT, ate lunch. A&Ox4. No complaints of pain. Ambulated to room without difficulty. Attended group. Affect brighter. More talkative and animated. A:Continue to monitor and offer support. R: Safety maintained.
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
## 2018-12-02 NOTE — [facility] Group Notes (Signed)
LCSW Group Therapy Note  12/02/2018 1:00 PM  Type of Therapy/Topic:  Group Therapy:  Emotion Regulation  Participation Level:  Active   Description of Group:   The purpose of this group is to assist patients in learning to regulate negative emotions and experience positive emotions. Patients will be guided to discuss ways in which they have been vulnerable to their negative emotions. These vulnerabilities will be juxtaposed with experiences of positive emotions or situations, and patients will be challenged to use positive emotions to combat negative ones. Special emphasis will be placed on coping with negative emotions in conflict situations, and patients will process healthy conflict resolution skills.  Therapeutic Goals: 1. Patient will identify two positive emotions or experiences to reflect on in order to balance out negative emotions 2. Patient will label two or more emotions that they find the most difficult to experience 3. Patient will demonstrate positive conflict resolution skills through discussion and/or role plays  Summary of Patient Progress: Patient was present for group. Patient participated in discussion on negative/positve emotions/situations that he has felt recently.  Patient was supportive of other group members.  Patient was able to identify coping skills that he uses and was receptive to the ideas from others on additional coping skills.    Therapeutic Modalities:   Cognitive Behavioral Therapy Feelings Identification Dialectical Behavioral Therapy  Penni Homans, MSW, LCSW 12/02/2018 2:50 PM

## 2018-12-02 NOTE — Anesthesia Procedure Notes (Signed)
Performed by: Angelis Gates, CRNA Pre-anesthesia Checklist: Patient identified, Emergency Drugs available, Suction available and Patient being monitored Patient Re-evaluated:Patient Re-evaluated prior to induction Oxygen Delivery Method: Circle system utilized Preoxygenation: Pre-oxygenation with 100% oxygen Induction Type: IV induction Ventilation: Mask ventilation without difficulty and Mask ventilation throughout procedure Airway Equipment and Method: Bite block Placement Confirmation: positive ETCO2 Dental Injury: Teeth and Oropharynx as per pre-operative assessment        
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
## 2018-12-02 NOTE — Anesthesia Postprocedure Evaluation (Signed)
Anesthesia Post Note  Patient: Jason Patterson  Procedure(s) Performed: ECT TX  Patient location during evaluation: Endoscopy Anesthesia Type: General Level of consciousness: awake and alert Pain management: pain level controlled Vital Signs Assessment: post-procedure vital signs reviewed and stable Respiratory status: spontaneous breathing, nonlabored ventilation, respiratory function stable and patient connected to nasal cannula oxygen Cardiovascular status: blood pressure returned to baseline and stable Postop Assessment: no apparent nausea or vomiting Anesthetic complications: no     Last Vitals:  Vitals:   12/02/18 1149 12/02/18 1159  BP: 135/90 130/83  Pulse: 69 67  Resp: (!) 26 (!) 26  Temp: 36.8 C   SpO2: 96% 100%    Last Pain:  Vitals:   12/02/18 1149  TempSrc:   PainSc: 0-No pain                 Jason Patterson

## 2018-12-02 NOTE — Procedures (Signed)
ECT SERVICES Physician's Interval Evaluation & Treatment Note  Patient Identification: Jason Patterson MRN:  [id number] Date of Evaluation:  12/02/2018 TX #: 6 (although this is only #1 of this inpatient series)  MADRS:   MMSE:   P.E. Findings:  Patient's physical exam unremarkable heart and lungs normal  Psychiatric Interval Note:  Chronic depression although he is already looking a little better today  Subjective:  Patient is a 63 y.o. male seen for evaluation for Electroconvulsive Therapy. No specific complaint  Treatment Summary:   [x]   Right Unilateral             []  Bilateral   % Energy : 0.3 ms 100%   Impedance: 1620 ohms  Seizure Energy Index: 2391 V squared  Postictal Suppression Index: 81%  Seizure Concordance Index: 98%  Medications  Pre Shock: Robinul 0.1 mg Toradol 30 mg Brevital 70 mg succinylcholine 100 mg  Post Shock:    Seizure Duration: 19 seconds by motor movement 36 seconds by EEG   Comments: Treatment on Friday at which point we are hoping we can anticipate discharge  Lungs:  [x]   Clear to auscultation               []  Other:   Heart:    [x]   Regular rhythm             []  irregular rhythm    [x]   Previous H&P reviewed, patient examined and there are NO CHANGES                 []   Previous H&P reviewed, patient examined and there are changes noted.   [name], MD 1/15/20203:22 PM
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
## 2018-12-02 NOTE — Transfer of Care (Signed)
Immediate Anesthesia Transfer of Care Note  Patient: Jason Patterson  Procedure(s) Performed: ECT TX  Patient Location: PACU  Anesthesia Type:General  Level of Consciousness: sedated  Airway & Oxygen Therapy: Patient Spontanous Breathing and Patient connected to face mask oxygen  Post-op Assessment: Report given to RN and Post -op Vital signs reviewed and stable  Post vital signs: Reviewed  Last Vitals:  Vitals Value Taken Time  BP    Temp    Pulse 62 12/02/2018 11:31 AM  Resp 28 12/02/2018 11:31 AM  SpO2 100 % 12/02/2018 11:31 AM  Vitals shown include unvalidated device data.  Last Pain:  Vitals:   12/02/18 0944  TempSrc: Oral  PainSc: 0-No pain      Patients Stated Pain Goal: 0 (11/29/18 0039)  Complications: No apparent anesthesia complications

## 2018-12-03 ENCOUNTER — Other Ambulatory Visit: Payer: Self-pay | Admitting: Psychiatry

## 2018-12-03 NOTE — Progress Notes (Signed)
D:Pt denies SI/HI/AVH, able to contract for safety. Pt is pleasant and cooperative, engages appropriately. Pt.has no Complaints, denies pain. Pt. Reports normal mood again this evening. Pt. Reports doing better this evening again. Pt. Affect improved.    A: Q x 15 minute observation checks were completed for safety. Patient was provided with education, needs reinforcement. Patient was given/offered medications per orders. Patient was encourage to attend groups, participate in unit activities and continue with plan of care. Pt. Chart and plans of care reviewed. Pt. Given support and encouragement.  R:Patient is complaint with medication and unit procedures. Pt. Attends snack time, eating good. Pt. Out in the milieu and socializing appropriately with staff and peers. Pt. Left forearm dressing clean, dry, and intact. Pt. Does continue though to present disheveled and not attending adequately to hygiene. Pt. Verbalizes understandings of NPO status and NPO maintained per orders. Pt. Blood sugar checked per pre-ect.              Precautionary checks every 15 minutes for safety maintained, room free of safety hazards, patient sustains no injury or falls during this shift. Will endorse care to next shift.
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
## 2018-12-03 NOTE — [facility] Group Notes (Signed)
Balance In Life 12/03/2018 1PM  Type of Therapy/Topic:  Group Therapy:  Balance in Life  Participation Level:  Did Not Attend  Description of Group:   This group will address the concept of balance and how it feels and looks when one is unbalanced. Patients will be encouraged to process areas in their lives that are out of balance and identify reasons for remaining unbalanced. Facilitators will guide patients in utilizing problem-solving interventions to address and correct the stressor making their life unbalanced. Understanding and applying boundaries will be explored and addressed for obtaining and maintaining a balanced life. Patients will be encouraged to explore ways to assertively make their unbalanced needs known to significant others in their lives, using other group members and facilitator for support and feedback.  Therapeutic Goals: 1. Patient will identify two or more emotions or situations they have that consume much of in their lives. 2. Patient will identify signs/triggers that life has become out of balance:  3. Patient will identify two ways to set boundaries in order to achieve balance in their lives:  4. Patient will demonstrate ability to communicate their needs through discussion and/or role plays  Summary of Patient Progress:    Therapeutic Modalities:   Cognitive Behavioral Therapy Solution-Focused Therapy Assertiveness Training  Wendell Fiebig T Lisaann Atha, LCSW  

## 2018-12-03 NOTE — Plan of Care (Signed)
Patient denies SI/HI/AVH and pain at this time, able to verbally contract for safety. Patient is pleasant and cooperative, engages appropriately.  Patient  was encouraged to attend groups, participate in unit activities and continue with plan of care. Patient observed ambulating the unit with a steady gait, compliant with treatment plan. Patient did not participate in group sessions today.

## 2018-12-03 NOTE — Plan of Care (Signed)
Pt. Denies si/hi/avh, can contract for safety. Pt. Able to remain safe while on the unit. Pt. Reports normal mood and that he is, "doing good".    Problem: Coping: Goal: Coping ability will improve Outcome: Progressing   Problem: Self-Concept: Goal: Ability to disclose and discuss suicidal ideas will improve Outcome: Progressing

## 2018-12-03 NOTE — Progress Notes (Signed)
Recreation Therapy Notes  Date: 12/03/2018  Time: 9:30 am   Location: Craft room   Behavioral response: N/A   Intervention Topic: Happiness  Discussion/Intervention: Patient did not attend group.   Clinical Observations/Feedback:  Patient did not attend group.   Jediah Horger LRT/CTRS        Gordan Grell 12/03/2018 10:52 AM 

## 2018-12-03 NOTE — Progress Notes (Signed)
D: Pt denies SI/HI/AVH, able to contract for safety. Pt is pleasant and cooperative, engages appropriately. Pt. has no Complaints, denies pain. Pt. Reports normal mood. Pt. Reports doing better this evening. Pt. Affect brighter overall.   A: Q x 15 minute observation checks were completed for safety. Patient was provided with education. Patient was given/offered medications per orders. Patient  was encourage to attend groups, participate in unit activities and continue with plan of care. Pt. Chart and plans of care reviewed. Pt. Given support and encouragement.   R: Patient is complaint with medication and unit procedures. Pt. Attends snack time, eating good. Pt. Out in the milieu and socializing appropriately with staff and peers. Pt. Left forearm dressing clean, dry, and intact.             Precautionary checks every 15 minutes for safety maintained, room free of safety hazards, patient sustains no injury or falls during this shift. Will endorse care to next shift.

## 2018-12-03 NOTE — Plan of Care (Signed)
Pt. Reports improved coping. Pt. Endorses a normal mood. Pt. Denies si/hi/avh, can contract for safety. Pt. Verbalizes more this evening.    Problem: Coping: Goal: Coping ability will improve Outcome: Progressing   Problem: Self-Concept: Goal: Ability to disclose and discuss suicidal ideas will improve Outcome: Progressing   Problem: Coping: Goal: Will verbalize feelings Outcome: Progressing

## 2018-12-04 ENCOUNTER — Inpatient Hospital Stay: Payer: Medicaid Other | Admitting: Anesthesiology

## 2018-12-04 ENCOUNTER — Encounter: Payer: Self-pay | Admitting: *Deleted

## 2018-12-04 LAB — GLUCOSE, CAPILLARY: Glucose-Capillary: 102 mg/dL — ABNORMAL HIGH (ref 70–99)

## 2018-12-04 MED ORDER — METOPROLOL TARTRATE 25 MG PO TABS: 12.5000 mg | ORAL_TABLET | Freq: Two times a day (BID) | ORAL | 1 refills | Status: DC

## 2018-12-04 MED ORDER — LEVETIRACETAM 500 MG PO TABS: 1000.0000 mg | ORAL_TABLET | Freq: Two times a day (BID) | ORAL | 1 refills | Status: DC

## 2018-12-04 MED ORDER — FAMOTIDINE 10 MG PO TABS: 10.0000 mg | ORAL_TABLET | Freq: Two times a day (BID) | ORAL | 1 refills | Status: DC

## 2018-12-04 MED ORDER — LATANOPROST 0.005 % OP SOLN: 1.0000 [drp] | Freq: Every day | OPHTHALMIC | 1 refills | Status: DC

## 2018-12-04 MED ORDER — B COMPLEX-C PO TABS: 1.0000 | ORAL_TABLET | Freq: Every day | ORAL | 1 refills | Status: DC

## 2018-12-04 MED ORDER — VITAMIN D3 25 MCG (1000 UNIT) PO TABS: 2000.0000 [IU] | ORAL_TABLET | Freq: Every day | ORAL | 1 refills | Status: DC

## 2018-12-04 MED ORDER — TAMSULOSIN HCL 0.4 MG PO CAPS: 0.4000 mg | ORAL_CAPSULE | Freq: Every day | ORAL | 1 refills | Status: DC

## 2018-12-04 MED ORDER — SODIUM CHLORIDE 0.9 % IV SOLN
INTRAVENOUS | Status: DC | PRN
  Administered 2018-12-04: 11:00:00 via INTRAVENOUS

## 2018-12-04 MED ORDER — SERTRALINE HCL 100 MG PO TABS: 100.0000 mg | ORAL_TABLET | Freq: Every day | ORAL | 1 refills | Status: DC

## 2018-12-04 MED ORDER — ALBUTEROL SULFATE HFA 108 (90 BASE) MCG/ACT IN AERS: 1.0000 | INHALATION_SPRAY | Freq: Four times a day (QID) | RESPIRATORY_TRACT | 1 refills | Status: DC | PRN

## 2018-12-04 MED ORDER — LOXAPINE SUCCINATE 25 MG PO CAPS: 25.0000 mg | ORAL_CAPSULE | Freq: Three times a day (TID) | ORAL | 1 refills | Status: DC

## 2018-12-04 MED ORDER — ADULT MULTIVITAMIN W/MINERALS CH: 1.0000 | ORAL_TABLET | Freq: Every day | ORAL | 1 refills | Status: DC

## 2018-12-04 MED ORDER — OLANZAPINE 15 MG PO TABS: 15.0000 mg | ORAL_TABLET | Freq: Every day | ORAL | 1 refills | Status: DC

## 2018-12-04 MED ORDER — ASPIRIN EC 81 MG PO TBEC: 81.0000 mg | DELAYED_RELEASE_TABLET | Freq: Every day | ORAL | 1 refills | Status: DC

## 2018-12-04 MED ORDER — SUCCINYLCHOLINE CHLORIDE 20 MG/ML IJ SOLN
INTRAMUSCULAR | Status: AC
  Filled 2018-12-04: qty 1

## 2018-12-04 MED ORDER — SODIUM CHLORIDE 0.9 % IV SOLN
500.0000 mL | Freq: Once | INTRAVENOUS | Status: AC
  Administered 2018-12-04: 500 mL via INTRAVENOUS

## 2018-12-04 MED ORDER — GLYCOPYRROLATE 0.2 MG/ML IJ SOLN
0.1000 mg | Freq: Once | INTRAMUSCULAR | Status: AC
  Administered 2018-12-04: 0.1 mg via INTRAVENOUS

## 2018-12-04 MED ORDER — METHOHEXITAL SODIUM 100 MG/10ML IV SOSY
PREFILLED_SYRINGE | INTRAVENOUS | Status: DC | PRN
  Administered 2018-12-04: 70 mg via INTRAVENOUS

## 2018-12-04 MED ORDER — SUCCINYLCHOLINE CHLORIDE 20 MG/ML IJ SOLN
INTRAMUSCULAR | Status: DC | PRN
  Administered 2018-12-04: 90 mg via INTRAVENOUS

## 2018-12-04 NOTE — [facility] Suicide Risk Assessment (Signed)
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   HENT: Negative.   Eyes: Negative.   Respiratory: Negative.   Cardiovascular: Negative.   Gastrointestinal: Negative.   Musculoskeletal: Negative.   Skin: Negative.   Neurological: Negative.   Psychiatric/Behavioral: Negative.     Blood pressure (!) 139/94, pulse 82, temperature 98.3 F (36.8 C), resp. rate 18, height 5\' 6"  (1.676 m), weight 73.5 kg, SpO2 91 %.Body mass index is 26.15 kg/m.  General Appearance: Casual  Eye Contact::  Good  Speech:  Clear and Coherent409  Volume:  Normal  Mood:  Euthymic  Affect:  Appropriate  Thought Process:  Coherent  Orientation:  Full (Time, Place, and Person)  Thought Content:  Logical  Suicidal Thoughts:  No  Homicidal Thoughts:  No  Memory:  Immediate;   Fair Recent;   Fair Remote;   Fair  Judgement:  Fair  Insight:  Fair  Psychomotor Activity:  Decreased  Concentration:  Fair  Recall:  [employer] of Knowledge:Fair  Language: Fair  Akathisia:  No  Handed:  Right  AIMS (if indicated):     Assets:  Desire for Improvement Financial Resources/Insurance Housing Resilience  Sleep:  Number of Hours: 7  Cognition: WNL  ADL's:  Intact   Mental Status Per Nursing Assessment::   On Admission:  Self-harm thoughts  Demographic Factors:  Caucasian and Low socioeconomic status  Loss Factors: Loss of significant relationship  Historical Factors: Prior suicide attempts and Impulsivity  Risk Reduction Factors:    Religious beliefs about death, Positive social support and Positive therapeutic relationship  Continued Clinical Symptoms:  Schizophrenia:   Depressive state  Cognitive Features That Contribute To Risk:  Polarized thinking    Suicide Risk:  Minimal: No identifiable suicidal ideation.  Patients presenting with no risk factors but with morbid ruminations; may be classified as minimal risk based on the severity of the depressive symptoms  Follow-up Information    [facility] Follow up on 12/07/2018.   Why:  ACT team will meet with you on Monday, December 07, 2018. Contact information: [address] [place] [address] [phone]           Plan Of Care/Follow-up recommendations:  Activity:  Activity as tolerated Diet:  Regular diet Other:  Follow-up with outpatient act team and also with ECT as scheduled  [name], MD 12/04/2018, 3:24 PM

## 2018-12-04 NOTE — Progress Notes (Signed)
Patient returned to unit after having ECT procedure done. Patient alert and oriented x 4. Ambulatory with a steady gait. Denies pain at this time. Medications administered without difficulty.  Food and po fluids provided to patient. Ate meal without difficulty, after meal pt observed resting in bed with eyes closed. Will continue to monitor.

## 2018-12-04 NOTE — Progress Notes (Signed)
Patient alert and oriented x 4. Ambulates unit with steady gait. Verbally denies SI/HI/AVH and pain. Patient discharged on above date and time. Verbalized understanding the discharge information provided to patient upon discharge. Patient departed unit with discharge paperwork, prescriptions and personal belongings. Picked up by Ms. [place] group home owner.

## 2018-12-04 NOTE — [facility] Group Notes (Signed)
LCSW Group Therapy Note  12/04/2018 1:00 PM  Type of Therapy and Topic:  Group Therapy:  Feelings around Relapse and Recovery  Participation Level:  Did Not Attend   Description of Group:    Patients in this group will discuss emotions they experience before and after a relapse. They will process how experiencing these feelings, or avoidance of experiencing them, relates to having a relapse. Facilitator will guide patients to explore emotions they have related to recovery. Patients will be encouraged to process which emotions are more powerful. They will be guided to discuss the emotional reaction significant others in their lives may have to their relapse or recovery. Patients will be assisted in exploring ways to respond to the emotions of others without this contributing to a relapse.  Therapeutic Goals: 1. Patient will identify two or more emotions that lead to a relapse for them 2. Patient will identify two emotions that result when they relapse 3. Patient will identify two emotions related to recovery 4. Patient will demonstrate ability to communicate their needs through discussion and/or role plays   Summary of Patient Progress:     Therapeutic Modalities:   Cognitive Behavioral Therapy Solution-Focused Therapy Assertiveness Training Relapse Prevention Therapy   Abriana Saltos, MSW, LCSW 12/04/2018 1:56 PM  

## 2018-12-04 NOTE — Tx Team (Signed)
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 acetaminophen (TYLENOL) tablet 650 mg  650 mg Oral Q6H PRN Santo Held, MD      . albuterol (PROVENTIL HFA;VENTOLIN HFA) 108 (90 Base) MCG/ACT inhaler 1 puff  1 puff Inhalation Q6H PRN Santo Held, MD      . alum & mag hydroxide-simeth (MAALOX/MYLANTA) 200-200-20 MG/5ML suspension 30 mL  30 mL Oral Q4H PRN Santo Held, MD      . aspirin EC tablet 81 mg  81 mg Oral Daily Santo Held, MD   81 mg at 12/03/18 0806  . B-complex with vitamin C tablet 1 tablet  1 tablet Oral Daily Santo Held, MD   1 tablet at 12/03/18 0806  . cholecalciferol (VITAMIN D) tablet 2,000 Units  2,000 Units Oral Daily Santo Held, MD   2,000 Units at 12/03/18 0806  . famotidine (PEPCID) tablet 10 mg  10 mg Oral BID Santo Held, MD   10 mg at 12/03/18 1658  . latanoprost (XALATAN) 0.005 % ophthalmic solution 1 drop  1 drop Both Eyes QHS Santo Held, MD   1 drop at 12/03/18 2156  . levETIRAcetam (KEPPRA) tablet 1,000 mg  1,000 mg Oral BID Santo Held, MD   1,000 mg at 12/03/18 1658  . loxapine (LOXITANE) capsule 25 mg  25 mg Oral TID Santo Held, MD   25 mg at 12/03/18 1657  . magnesium hydroxide (MILK OF MAGNESIA) suspension 30 mL  30 mL Oral Daily PRN Santo Held, MD      . metoprolol tartrate (LOPRESSOR) tablet 12.5 mg  12.5 mg Oral BID Santo Held, MD   12.5 mg at 12/04/18 0748  . OLANZapine (ZYPREXA) tablet 15 mg  15 mg Oral QHS Clapacs, Jackquline Denmark, MD   15 mg at 12/03/18 2154  .  sertraline (ZOLOFT) tablet 100 mg  100 mg Oral Daily Clapacs, Jackquline Denmark, MD   100 mg at 12/03/18 0807  . tamsulosin (FLOMAX) capsule 0.4 mg  0.4 mg Oral Daily Santo Held, MD   0.4 mg at 12/02/18 2595   PTA Medications: Medications Prior to Admission  Medication Sig Dispense Refill Last Dose  . albuterol (PROAIR HFA) 108 (90 Base) MCG/ACT inhaler Inhale 1 puff into the lungs every 6 (six) hours as needed for wheezing or shortness of breath. 1 Inhaler 1 11/19/2018 at Unknown time  . aspirin EC 81 MG tablet Take 1 tablet (81 mg total) by mouth daily. 30 tablet 1 11/19/2018 at Unknown time  . b complex vitamins capsule Take 1 capsule by mouth daily.   11/05/2018  . cholecalciferol (VITAMIN D) 25 MCG (1000 UT) tablet Take 2 tablets (2,000 Units total) by mouth daily. 60 tablet 1 11/19/2018 at Unknown time  . famotidine (PEPCID) 10 MG tablet Take 1 tablet (10 mg total) by mouth 2 (two) times daily. 60 tablet 1 11/19/2018 at Unknown time  . latanoprost (XALATAN) 0.005 % ophthalmic solution Place 1 drop into both eyes at bedtime. 2.5 mL 1 11/19/2018 at Unknown time  . levETIRAcetam (KEPPRA) 500 MG tablet Take 2 tablets (1,000 mg total) by mouth 2 (two) times daily. 120 tablet 1  11/19/2018 at Unknown time  . loxapine (LOXITANE) 25 MG capsule Take 1 capsule (25 mg total) by mouth 3 (three) times daily. 90 capsule 1 11/19/2018 at Unknown time  . metoprolol tartrate (LOPRESSOR) 25 MG tablet Take 0.5 tablets (12.5 mg total) by mouth 2 (two) times daily. 60 tablet 1 11/19/2018 at Unknown time  . Multiple Vitamin (MULTIVITAMIN WITH MINERALS) TABS tablet Take 1 tablet by mouth daily. 30 tablet 1 11/19/2018 at Unknown time  . OLANZapine (ZYPREXA) 20 MG tablet Take 1 tablet (20 mg total) by mouth 2 (two) times daily. 60 tablet 1 11/19/2018 at Unknown time  . polyethylene glycol (MIRALAX / GLYCOLAX) packet Take 17 g by mouth 2 (two) times daily. 60 each 1 11/19/2018 at Unknown time  . senna (SENOKOT) 8.6 MG TABS tablet Take 1 tablet (8.6  mg total) by mouth daily as needed for mild constipation. 30 each 1 11/19/2018 at Unknown time  . tamsulosin (FLOMAX) 0.4 MG CAPS capsule Take 1 capsule (0.4 mg total) by mouth daily. 30 capsule 1 11/19/2018 at Unknown time  . traZODone (DESYREL) 100 MG tablet Take 1 tablet (100 mg total) by mouth at bedtime. 30 tablet 1 11/19/2018 at Unknown time    Patient Stressors: Financial difficulties Medication change or noncompliance Occupational concerns  Patient Strengths: Average or above average intelligence Capable of independent living Motivation for treatment/growth  Treatment Modalities: Medication Management, Group therapy, Case management,  1 to 1 session with clinician, Psychoeducation, Recreational therapy.   Physician Treatment Plan for Primary Diagnosis: Schizoaffective disorder, unspecified type (HCC) Long Term Goal(s): Improvement in symptoms so as ready for discharge Improvement in symptoms so as ready for discharge   Short Term Goals: Ability to identify changes in lifestyle to reduce recurrence of condition will improve Ability to verbalize feelings will improve Ability to disclose and discuss suicidal ideas Ability to demonstrate self-control will improve Ability to identify and develop effective coping behaviors will improve Ability to maintain clinical measurements within normal limits will improve Compliance with prescribed medications will improve Ability to identify triggers associated with substance abuse/mental health issues will improve Ability to identify changes in lifestyle to reduce recurrence of condition will improve Ability to verbalize feelings will improve Ability to disclose and discuss suicidal ideas Ability to demonstrate self-control will improve Ability to identify and develop effective coping behaviors will improve Ability to maintain clinical measurements within normal limits will improve Compliance with prescribed medications will improve Ability to  identify triggers associated with substance abuse/mental health issues will improve  Medication Management: Evaluate patient's response, side effects, and tolerance of medication regimen.  Therapeutic Interventions: 1 to 1 sessions, Unit Group sessions and Medication administration.  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)  Long Term Goal(s): Improvement in symptoms so as ready for discharge Improvement in symptoms so as ready for discharge   Short Term Goals: Ability to identify changes in lifestyle to reduce recurrence of condition will improve Ability to verbalize feelings will improve Ability to disclose and discuss suicidal ideas Ability to demonstrate self-control will improve Ability to identify and develop effective coping behaviors will improve Ability to maintain clinical measurements within normal limits will improve Compliance with prescribed medications will improve Ability to identify triggers associated with substance abuse/mental health issues will improve Ability to identify changes in lifestyle to reduce recurrence of condition will improve  Ability to verbalize feelings will improve Ability to disclose and discuss suicidal ideas Ability to demonstrate self-control will improve Ability to identify and develop effective coping behaviors will improve Ability to maintain clinical measurements within normal limits will improve Compliance with prescribed medications will improve Ability to identify triggers associated with substance abuse/mental health issues will improve     Medication Management: Evaluate patient's response, side effects, and tolerance of medication regimen.  Therapeutic Interventions: 1 to 1 sessions, Unit Group sessions and Medication  administration.  Evaluation of Outcomes: Progressing   RN Treatment Plan for Primary Diagnosis: Schizoaffective disorder, unspecified type (HCC) Long Term Goal(s): Knowledge of disease and therapeutic regimen to maintain health will improve  Short Term Goals: Ability to participate in decision making will improve, Ability to verbalize feelings will improve, Ability to disclose and discuss suicidal ideas, Ability to identify and develop effective coping behaviors will improve and Compliance with prescribed medications will improve  Medication Management: RN will administer medications as ordered by provider, will assess and evaluate patient's response and provide education to patient for prescribed medication. RN will report any adverse and/or side effects to prescribing provider.  Therapeutic Interventions: 1 on 1 counseling sessions, Psychoeducation, Medication administration, Evaluate responses to treatment, Monitor vital signs and CBGs as ordered, Perform/monitor CIWA, COWS, AIMS and Fall Risk screenings as ordered, Perform wound care treatments as ordered.  Evaluation of Outcomes: Progressing   LCSW Treatment Plan for Primary Diagnosis: Schizoaffective disorder, unspecified type (HCC) Long Term Goal(s): Safe transition to appropriate next level of care at discharge, Engage patient in therapeutic group addressing interpersonal concerns.  Short Term Goals: Engage patient in aftercare planning with referrals and resources  Therapeutic Interventions: Assess for all discharge needs, 1 to 1 time with Social worker, Explore available resources and support systems, Assess for adequacy in community support network, Educate family and significant other(s) on suicide prevention, Complete Psychosocial Assessment, Interpersonal group therapy.  Evaluation of Outcomes: Progressing   Progress in Treatment: Attending groups: Yes. Participating in groups: Yes. Taking medication as prescribed:  Yes. Toleration medication: Yes. Family/Significant other contact made: Yes, individual(s) contacted:  [name], legal guardian Patient understands diagnosis: No. Discussing patient identified problems/goals with staff: Yes. Medical problems stabilized or resolved: Yes. Denies suicidal/homicidal ideation: Yes. Issues/concerns per patient self-inventory: No.  Other: NA  New problem(s) identified: No, Describe:  None reported  New Short Term/Long Term Goal(s): "wants a new group home"  Patient Goals:  "wants a new group home"  Discharge Plan or Barriers: Pt will return to [facility], follow up with [facility], resume ECT.  Initially pt communicated wanting a new group home and it was explained to him his legal guardian would make that decision.  Reason for Continuation of Hospitalization: Medication stabilization  Estimated Length of Stay: D/C on 12/04/2018  Recreational Therapy: Patient Stressors: N/A Patient Goal: Patient will engage in groups without prompting or encouragement from LRT x3 group sessions within 5 recreation therapy group sessions  Date: 12/04/2018  Attendees: Patient: 12/04/2018 9:12 AM  Physician: [name], MD 12/04/2018 9:12 AM  Nursing:  12/04/2018 9:12 AM  RN Care Manager: 12/04/2018 9:12 AM  Social Worker: Lowella Dandy LCSW Penni Homans LCSW 12/04/2018 9:12 AM  Recreational Therapist:  12/04/2018 9:12 AM  Other:  12/04/2018 9:12 AM  Other:  12/04/2018 9:12 AM  Other: 12/04/2018 9:12 AM    Scribe for Treatment Team: Suzan Slick, LCSW 12/04/2018 9:12 AM

## 2018-12-04 NOTE — Anesthesia Procedure Notes (Signed)
Date/Time: 12/04/2018 11:18 AM Performed by: Omer JackWeatherly, Nester Bachus, CRNA Pre-anesthesia Checklist: Patient identified, Emergency Drugs available, Suction available and Patient being monitored Patient Re-evaluated:Patient Re-evaluated prior to induction Oxygen Delivery Method: Circle system utilized Preoxygenation: Pre-oxygenation with 100% oxygen Induction Type: IV induction Ventilation: Mask ventilation without difficulty and Mask ventilation throughout procedure Airway Equipment and Method: Bite block Placement Confirmation: positive ETCO2 Dental Injury: Teeth and Oropharynx as per pre-operative assessment

## 2018-12-04 NOTE — Progress Notes (Signed)
  [facility] Adult Case Management Discharge Plan :  Will you be returning to the same living situation after discharge:  Yes,  pt lives at [facility] At discharge, do you have transportation home?: Yes,  group home will provide transportation Do you have the ability to pay for your medications: Yes,  insurance  Release of information consent forms completed and in the chart;  Patient's signature needed at discharge.  Patient to Follow up at: Follow-up Information    [facility] Follow up on 12/07/2018.   Why:  ACT team will meet with you on Monday, December 07, 2018. Contact information: [address] [place] [address] [phone]           Next level of care provider has access to [facility] Link:no  Safety Planning and Suicide Prevention discussed: Yes,  Marisa Severin, [facility] employee  Have you used any form of tobacco in the last 30 days? (Cigarettes, Smokeless Tobacco, Cigars, and/or Pipes): Yes  Has patient been referred to the Quitline?: Physician counseled pt on smoking cessation  Patient has been referred for addiction treatment: N/A  Suzan Slick, LCSW 12/04/2018, 9:08 AM

## 2018-12-04 NOTE — Procedures (Signed)
ECT SERVICES Physician's Interval Evaluation & Treatment Note  Patient Identification: Jason Patterson MRN:  [id number] Date of Evaluation:  12/04/2018 TX #: [id number]  MADRS:   MMSE:   P.E. Findings:  No change in physical exam  Psychiatric Interval Note:  Mood is better  Subjective:  Patient is a 63 y.o. male seen for evaluation for Electroconvulsive Therapy. Denies suicidal ideation  Treatment Summary:   [x]   Right Unilateral             []  Bilateral   % Energy : 0.3 ms 100%   Impedance: 2000 ohms  Seizure Energy Index: 3512 V squared  Postictal Suppression Index: 70%  Seizure Concordance Index: 57%  Medications  Pre Shock: Robinul 0.1 mg Toradol 30 mg Brevital 70 mg succinylcholine 100 mg  Post Shock:    Seizure Duration: 18 seconds EMG 33 seconds EEG   Comments: Follow-up 2 weeks  Lungs:  [x]   Clear to auscultation               []  Other:   Heart:    [x]   Regular rhythm             []  irregular rhythm    [x]   Previous H&P reviewed, patient examined and there are NO CHANGES                 []   Previous H&P reviewed, patient examined and there are changes noted.   [name], MD 1/17/202011:12 AM

## 2018-12-04 NOTE — Anesthesia Post-op Follow-up Note (Signed)
Anesthesia QCDR form completed.        

## 2018-12-04 NOTE — Transfer of Care (Signed)
Immediate Anesthesia Transfer of Care Note  Patient: Jason Patterson  Procedure(s) Performed: ECT TX  Patient Location: PACU  Anesthesia Type:General  Level of Consciousness: drowsy and patient cooperative  Airway & Oxygen Therapy: Patient Spontanous Breathing and Patient connected to face mask oxygen  Post-op Assessment: Report given to RN and Post -op Vital signs reviewed and stable  Post vital signs: Reviewed and stable  Last Vitals:  Vitals Value Taken Time  BP 112/78 12/04/2018 11:28 AM  Temp 36.8 C 12/04/2018 11:28 AM  Pulse 64 12/04/2018 11:29 AM  Resp 20 12/04/2018 11:29 AM  SpO2 100 % 12/04/2018 11:29 AM  Vitals shown include unvalidated device data.  Last Pain:  Vitals:   12/04/18 0923  TempSrc:   PainSc: 0-No pain      Patients Stated Pain Goal: 0 (11/29/18 0039)  Complications: No apparent anesthesia complications

## 2018-12-04 NOTE — Progress Notes (Signed)
Recreation Therapy Notes   Date: 12/04/2018  Time: 9:30 am   Location: Craft room   Behavioral response: N/A   Intervention Topic: Self-esteem  Discussion/Intervention: Patient did not attend group.   Clinical Observations/Feedback:  Patient did not attend group.   Yuma Pacella LRT/CTRS        Keyaria Lawson 12/04/2018 10:26 AM 
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
## 2018-12-04 NOTE — Progress Notes (Signed)
Recreation Therapy Notes  INPATIENT RECREATION TR PLAN  Patient Details Name: Jason Patterson MRN: [id number] DOB: [birth year] Today's Date: 12/04/2018  Rec Therapy Plan Is patient appropriate for Therapeutic Recreation?: Yes Treatment times per week: at least 3 Estimated Length of Stay: 5-7 days TR Treatment/Interventions: Group participation (Comment)  Discharge Criteria Pt will be discharged from therapy if:: Discharged Treatment plan/goals/alternatives discussed and agreed upon by:: Patient/family  Discharge Summary Short term goals set: Patient will engage in groups without prompting or encouragement from LRT x3 group sessions within 5 recreation therapy group sessions Short term goals met: Not met Reason goals not met: Patient did not attend any groups Therapeutic equipment acquired: N/A Reason patient discharged from therapy: Discharge from hospital Pt/family agrees with progress & goals achieved: Yes Date patient discharged from therapy: 12/04/18   Kalilah Barua 12/04/2018, 3:07 PM
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
## 2018-12-07 NOTE — Anesthesia Postprocedure Evaluation (Signed)
Anesthesia Post Note  Patient: Jason Patterson  Procedure(s) Performed: ECT TX  Patient location during evaluation: PACU Anesthesia Type: General Level of consciousness: awake and alert Pain management: pain level controlled Vital Signs Assessment: post-procedure vital signs reviewed and stable Respiratory status: spontaneous breathing, nonlabored ventilation, respiratory function stable and patient connected to nasal cannula oxygen Cardiovascular status: blood pressure returned to baseline and stable Postop Assessment: no apparent nausea or vomiting Anesthetic complications: no     Last Vitals:  Vitals:   12/04/18 1241 12/04/18 1347  BP: (!) 139/94   Pulse: 79 82  Resp:  18  Temp:  36.8 C  SpO2: 91%     Last Pain:  Vitals:   12/04/18 1149  TempSrc:   PainSc: 0-No pain                 Lenard Simmer

## 2018-12-18 ENCOUNTER — Ambulatory Visit
Admission: RE | Admit: 2018-12-18 | Discharge: 2018-12-18 | Disposition: A | Discharge status: Home / Self Care | Payer: Medicaid Other | Source: Ambulatory Visit | Attending: Psychiatry | Admitting: Psychiatry

## 2018-12-18 ENCOUNTER — Encounter: Payer: Self-pay | Admitting: Certified Registered Nurse Anesthetist

## 2018-12-18 DIAGNOSIS — I499 Cardiac arrhythmia, unspecified: Secondary | ICD-10-CM | POA: Insufficient documentation

## 2018-12-18 DIAGNOSIS — F250 Schizoaffective disorder, bipolar type: Secondary | ICD-10-CM | POA: Insufficient documentation

## 2018-12-18 DIAGNOSIS — Z880 Allergy status to penicillin: Secondary | ICD-10-CM | POA: Diagnosis not present

## 2018-12-18 DIAGNOSIS — F17210 Nicotine dependence, cigarettes, uncomplicated: Secondary | ICD-10-CM | POA: Insufficient documentation

## 2018-12-18 DIAGNOSIS — J449 Chronic obstructive pulmonary disease, unspecified: Secondary | ICD-10-CM | POA: Insufficient documentation

## 2018-12-18 DIAGNOSIS — R51 Headache: Secondary | ICD-10-CM | POA: Insufficient documentation

## 2018-12-18 DIAGNOSIS — G709 Myoneural disorder, unspecified: Secondary | ICD-10-CM | POA: Diagnosis not present

## 2018-12-18 DIAGNOSIS — F251 Schizoaffective disorder, depressive type: Secondary | ICD-10-CM

## 2018-12-18 DIAGNOSIS — Z89421 Acquired absence of other right toe(s): Secondary | ICD-10-CM | POA: Diagnosis not present

## 2018-12-18 DIAGNOSIS — L409 Psoriasis, unspecified: Secondary | ICD-10-CM | POA: Insufficient documentation

## 2018-12-18 DIAGNOSIS — K219 Gastro-esophageal reflux disease without esophagitis: Secondary | ICD-10-CM | POA: Diagnosis not present

## 2018-12-18 DIAGNOSIS — I10 Essential (primary) hypertension: Secondary | ICD-10-CM | POA: Insufficient documentation

## 2018-12-18 DIAGNOSIS — G40909 Epilepsy, unspecified, not intractable, without status epilepticus: Secondary | ICD-10-CM | POA: Diagnosis not present

## 2018-12-18 DIAGNOSIS — Z888 Allergy status to other drugs, medicaments and biological substances status: Secondary | ICD-10-CM | POA: Diagnosis not present

## 2018-12-18 DIAGNOSIS — F259 Schizoaffective disorder, unspecified: Secondary | ICD-10-CM | POA: Diagnosis present

## 2018-12-18 MED ORDER — SUCCINYLCHOLINE CHLORIDE 20 MG/ML IJ SOLN
INTRAMUSCULAR | Status: DC | PRN
  Administered 2018-12-18: 90 mg via INTRAVENOUS

## 2018-12-18 MED ORDER — SODIUM CHLORIDE 0.9 % IV SOLN
500.0000 mL | Freq: Once | INTRAVENOUS | Status: AC
  Administered 2018-12-18: 500 mL via INTRAVENOUS

## 2018-12-18 MED ORDER — METHOHEXITAL SODIUM 100 MG/10ML IV SOSY
PREFILLED_SYRINGE | INTRAVENOUS | Status: DC | PRN
  Administered 2018-12-18: 70 mg via INTRAVENOUS

## 2018-12-18 MED ORDER — GLYCOPYRROLATE 0.2 MG/ML IJ SOLN: 0.1000 mg | Freq: Once | INTRAMUSCULAR | Status: DC

## 2018-12-18 MED ORDER — KETOROLAC TROMETHAMINE 30 MG/ML IJ SOLN: 30.0000 mg | Freq: Once | INTRAMUSCULAR | Status: DC

## 2018-12-18 NOTE — Anesthesia Procedure Notes (Signed)
Procedure Name: General with mask airway Date/Time: 12/18/2018 11:01 AM Performed by: Dava Najjar, CRNA Pre-anesthesia Checklist: Patient identified, Emergency Drugs available, Suction available, Patient being monitored and Timeout performed Patient Re-evaluated:Patient Re-evaluated prior to induction Oxygen Delivery Method: Circle system utilized Preoxygenation: Pre-oxygenation with 100% oxygen Induction Type: IV induction Ventilation: Mask ventilation without difficulty Airway Equipment and Method: Bite block Dental Injury: Teeth and Oropharynx as per pre-operative assessment

## 2018-12-18 NOTE — Anesthesia Post-op Follow-up Note (Signed)
Anesthesia QCDR form completed.        
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
## 2018-12-18 NOTE — Discharge Instructions (Signed)
1)  The drugs that you have been given will stay in your system until tomorrow so for the       next 24 hours you should not:  A. Drive an automobile  B. Make any legal decisions  C. Drink any alcoholic beverages  2)  You may resume your regular meals upon return home.  3)  A responsible adult must take you home.  Someone should stay with you for a few          hours, then be available by phone for the remainder of the treatment day.  4)  You May experience any of the following symptoms:  Headache, Nausea and a dry mouth (due to the medications you were given),  temporary memory loss and some confusion, or sore muscles (a warm bath  should help this).  If you you experience any of these symptoms let us know on                your return visit.  5)  Report any of the following: any acute discomfort, severe headache, or temperature        greater than 100.5 F.   Also report any unusual redness, swelling, drainage, or pain         at your IV site.    You may report Symptoms to:  ECT PROGRAM- [place] at [facility]          Phone: [phone], ECT Department           or Dr. Shary Key office [phone]  6)  Your next ECT Treatment is Wednesday February 19  We will call 2 days prior to your scheduled appointment for arrival times.  7)  Nothing to eat or drink after midnight the night before your procedure.  8)  Take    With a sip of water the morning of your procedure.  9)  Other Instructions: Call [phone] to cancel the morning of your procedure due         to illness or emergency.  10) We will call within 72 hours to assess how you are feeling.

## 2018-12-18 NOTE — Transfer of Care (Signed)
Immediate Anesthesia Transfer of Care Note  Patient: Jason Patterson  Procedure(s) Performed: ECT TX  Patient Location: PACU  Anesthesia Type:General  Level of Consciousness: awake, alert  and patient cooperative  Airway & Oxygen Therapy: Patient Spontanous Breathing and Patient connected to face mask oxygen  Post-op Assessment: Report given to RN and Post -op Vital signs reviewed and stable  Post vital signs: Reviewed and stable  Last Vitals:  Vitals Value Taken Time  BP    Temp    Pulse    Resp 21 12/18/2018 11:11 AM  SpO2    Vitals shown include unvalidated device data.  Last Pain:  Vitals:   12/18/18 0902  TempSrc: Oral  PainSc: 0-No pain         Complications: No apparent anesthesia complications
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
## 2018-12-18 NOTE — Procedures (Signed)
ECT SERVICES Physician's Interval Evaluation & Treatment Note  Patient Identification: Mekhi Rayer MRN:  [id number] Date of Evaluation:  12/18/2018 TX #: 8  MADRS:   MMSE:   P.E. Findings:  No change physical exam  Psychiatric Interval Note:  Mood is doing actually rather well.  Feels good.  Subjective:  Patient is a 63 y.o. male seen for evaluation for Electroconvulsive Therapy. Upbeat  Treatment Summary:   [x]   Right Unilateral             []  Bilateral   % Energy : 0.3 ms 100%   Impedance: 1870 ohms  Seizure Energy Index: 6958 V squared  Postictal Suppression Index: 59%  Seizure Concordance Index: 97%  Medications  Pre Shock: Robinul 0.1 mg Toradol 30 mg Brevital 70 mg succinylcholine 100 mg  Post Shock:    Seizure Duration: 23 seconds EMG 44 seconds EEG   Comments: Follow-up in 2 weeks  Lungs:  [x]   Clear to auscultation               []  Other:   Heart:    [x]   Regular rhythm             []  irregular rhythm    [x]   Previous H&P reviewed, patient examined and there are NO CHANGES                 []   Previous H&P reviewed, patient examined and there are changes noted.   [name], MD 1/31/202010:54 AM

## 2018-12-20 NOTE — Anesthesia Postprocedure Evaluation (Signed)
Anesthesia Post Note  Patient: Jason Patterson  Procedure(s) Performed: ECT TX  Patient location during evaluation: PACU Anesthesia Type: General Level of consciousness: awake and alert Pain management: pain level controlled Vital Signs Assessment: post-procedure vital signs reviewed and stable Respiratory status: spontaneous breathing, nonlabored ventilation, respiratory function stable and patient connected to nasal cannula oxygen Cardiovascular status: blood pressure returned to baseline and stable Postop Assessment: no apparent nausea or vomiting Anesthetic complications: no     Last Vitals:  Vitals:   12/18/18 1111 12/18/18 1119  BP: 136/88 115/89  Pulse: 96 75  Resp: (!) 21 (!) 24  Temp: 36.8 C 37.1 C  SpO2: 97% 95%    Last Pain:  Vitals:   12/18/18 1119  TempSrc:   PainSc: 0-No pain                 Lenard Simmer

## 2019-01-01 ENCOUNTER — Telehealth: Payer: Self-pay

## 2019-01-04 ENCOUNTER — Other Ambulatory Visit: Payer: Self-pay | Admitting: Psychiatry

## 2019-01-06 ENCOUNTER — Encounter: Payer: Self-pay | Admitting: Anesthesiology

## 2019-01-06 ENCOUNTER — Encounter
Admission: RE | Admit: 2019-01-06 | Discharge: 2019-01-06 | Disposition: A | Discharge status: Home / Self Care | Payer: Medicaid Other | Source: Ambulatory Visit | Attending: Psychiatry | Admitting: Psychiatry

## 2019-01-06 DIAGNOSIS — F259 Schizoaffective disorder, unspecified: Secondary | ICD-10-CM | POA: Diagnosis not present

## 2019-01-06 DIAGNOSIS — G40909 Epilepsy, unspecified, not intractable, without status epilepticus: Secondary | ICD-10-CM | POA: Diagnosis not present

## 2019-01-06 DIAGNOSIS — I10 Essential (primary) hypertension: Secondary | ICD-10-CM | POA: Insufficient documentation

## 2019-01-06 DIAGNOSIS — Z880 Allergy status to penicillin: Secondary | ICD-10-CM | POA: Insufficient documentation

## 2019-01-06 DIAGNOSIS — K219 Gastro-esophageal reflux disease without esophagitis: Secondary | ICD-10-CM | POA: Diagnosis not present

## 2019-01-06 DIAGNOSIS — F319 Bipolar disorder, unspecified: Secondary | ICD-10-CM | POA: Insufficient documentation

## 2019-01-06 DIAGNOSIS — F250 Schizoaffective disorder, bipolar type: Secondary | ICD-10-CM | POA: Diagnosis not present

## 2019-01-06 DIAGNOSIS — F17210 Nicotine dependence, cigarettes, uncomplicated: Secondary | ICD-10-CM | POA: Insufficient documentation

## 2019-01-06 DIAGNOSIS — J449 Chronic obstructive pulmonary disease, unspecified: Secondary | ICD-10-CM | POA: Diagnosis not present

## 2019-01-06 MED ORDER — FENTANYL CITRATE (PF) 100 MCG/2ML IJ SOLN: 25.0000 ug | INTRAMUSCULAR | Status: DC | PRN

## 2019-01-06 MED ORDER — GLYCOPYRROLATE 0.2 MG/ML IJ SOLN
INTRAMUSCULAR | Status: AC
  Administered 2019-01-06: 0.1 mg via INTRAVENOUS
  Filled 2019-01-06: qty 1

## 2019-01-06 MED ORDER — SUCCINYLCHOLINE CHLORIDE 20 MG/ML IJ SOLN
INTRAMUSCULAR | Status: AC
  Filled 2019-01-06: qty 1

## 2019-01-06 MED ORDER — ONDANSETRON HCL 4 MG/2ML IJ SOLN: 4.0000 mg | Freq: Once | INTRAMUSCULAR | Status: DC | PRN

## 2019-01-06 MED ORDER — GLYCOPYRROLATE 0.2 MG/ML IJ SOLN
0.1000 mg | Freq: Once | INTRAMUSCULAR | Status: AC
  Administered 2019-01-06: 0.1 mg via INTRAVENOUS

## 2019-01-06 MED ORDER — METHOHEXITAL SODIUM 100 MG/10ML IV SOSY
PREFILLED_SYRINGE | INTRAVENOUS | Status: DC | PRN
  Administered 2019-01-06: 70 mg via INTRAVENOUS

## 2019-01-06 MED ORDER — SUCCINYLCHOLINE CHLORIDE 200 MG/10ML IV SOSY
PREFILLED_SYRINGE | INTRAVENOUS | Status: DC | PRN
  Administered 2019-01-06: 90 mg via INTRAVENOUS

## 2019-01-06 MED ORDER — SODIUM CHLORIDE 0.9 % IV SOLN
500.0000 mL | Freq: Once | INTRAVENOUS | Status: AC
  Administered 2019-01-06: 500 mL via INTRAVENOUS

## 2019-01-06 MED ORDER — METHOHEXITAL SODIUM 0.5 G IJ SOLR
INTRAMUSCULAR | Status: AC
  Filled 2019-01-06: qty 500

## 2019-01-06 MED ORDER — KETOROLAC TROMETHAMINE 30 MG/ML IJ SOLN
30.0000 mg | Freq: Once | INTRAMUSCULAR | Status: AC
  Administered 2019-01-06: 30 mg via INTRAVENOUS

## 2019-01-06 MED ORDER — SODIUM CHLORIDE 0.9 % IV SOLN
INTRAVENOUS | Status: DC | PRN
  Administered 2019-01-06: 08:00:00 via INTRAVENOUS

## 2019-01-06 MED ORDER — KETOROLAC TROMETHAMINE 30 MG/ML IJ SOLN
INTRAMUSCULAR | Status: AC
  Administered 2019-01-06: 30 mg via INTRAVENOUS
  Filled 2019-01-06: qty 1

## 2019-01-06 NOTE — Anesthesia Post-op Follow-up Note (Signed)
Anesthesia QCDR form completed.        

## 2019-01-06 NOTE — Procedures (Signed)
ECT SERVICES Physician's Interval Evaluation & Treatment Note  Patient Identification: Zylan Marinaro MRN:  [id number] Date of Evaluation:  01/06/2019 TX #: 9  MADRS:   MMSE:   P.E. Findings:  No change physical exam  Psychiatric Interval Note:  Doing well no complaints of depression  Subjective:  Patient is a 63 y.o. male seen for evaluation for Electroconvulsive Therapy. No complaints  Treatment Summary:   [x]   Right Unilateral             []  Bilateral   % Energy : 0.3 ms 100%   Impedance: 1320 ohms  Seizure Energy Index: 3920 V squared  Postictal Suppression Index: 90%  Seizure Concordance Index: 88%  Medications  Pre Shock: Robinul 0.1 mg Toradol 30 mg Brevital 70 mg succinylcholine 100 mg  Post Shock:    Seizure Duration: 22 seconds EMG 44 seconds EEG   Comments: Follow-up in 3 weeks  Lungs:  [x]   Clear to auscultation               []  Other:   Heart:    [x]   Regular rhythm             []  irregular rhythm    [x]   Previous H&P reviewed, patient examined and there are NO CHANGES                 []   Previous H&P reviewed, patient examined and there are changes noted.   [name], MD 2/19/202011:56 AM

## 2019-01-06 NOTE — Anesthesia Procedure Notes (Signed)
Date/Time: 01/06/2019 12:04 PM Performed by: Lily Kocher, CRNA Pre-anesthesia Checklist: Patient identified, Emergency Drugs available, Suction available and Patient being monitored Patient Re-evaluated:Patient Re-evaluated prior to induction Oxygen Delivery Method: Circle system utilized Preoxygenation: Pre-oxygenation with 100% oxygen Induction Type: IV induction Ventilation: Mask ventilation without difficulty and Mask ventilation throughout procedure Airway Equipment and Method: Bite block Placement Confirmation: positive ETCO2 Dental Injury: Teeth and Oropharynx as per pre-operative assessment

## 2019-01-06 NOTE — Anesthesia Postprocedure Evaluation (Signed)
Anesthesia Post Note  Patient: Jason Patterson  Procedure(s) Performed: ECT TX  Patient location during evaluation: PACU Anesthesia Type: General Level of consciousness: awake and alert Pain management: pain level controlled Vital Signs Assessment: post-procedure vital signs reviewed and stable Respiratory status: spontaneous breathing, nonlabored ventilation and respiratory function stable Cardiovascular status: blood pressure returned to baseline and stable Postop Assessment: no apparent nausea or vomiting Anesthetic complications: no     Last Vitals:  Vitals:   01/06/19 1237 01/06/19 1243  BP:  121/89  Pulse: 76 78  Resp:  18  Temp:  37.1 C  SpO2: 93%     Last Pain:  Vitals:   01/06/19 1243  TempSrc: Oral  PainSc: 0-No pain                 Christia Reading

## 2019-01-06 NOTE — Transfer of Care (Signed)
Immediate Anesthesia Transfer of Care Note  Patient: Jason Patterson  Procedure(s) Performed: ECT TX  Patient Location: PACU  Anesthesia Type:General  Level of Consciousness: sedated  Airway & Oxygen Therapy: Patient Spontanous Breathing and Patient connected to face mask oxygen  Post-op Assessment: Report given to RN and Post -op Vital signs reviewed and stable  Post vital signs: Reviewed and stable  Last Vitals:  Vitals Value Taken Time  BP 149/84 01/06/2019 12:13 PM  Temp 36.3 C 01/06/2019 12:13 PM  Pulse 76 01/06/2019 12:14 PM  Resp 18 01/06/2019 12:14 PM  SpO2 100 % 01/06/2019 12:14 PM  Vitals shown include unvalidated device data.  Last Pain:  Vitals:   01/06/19 0926  TempSrc:   PainSc: 0-No pain         Complications: No apparent anesthesia complications
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
## 2019-01-06 NOTE — Discharge Instructions (Signed)
1)  The drugs that you have been given will stay in your system until tomorrow so for the       next 24 hours you should not:  A. Drive an automobile  B. Make any legal decisions  C. Drink any alcoholic beverages  2)  You may resume your regular meals upon return home.  3)  A responsible adult must take you home.  Someone should stay with you for a few          hours, then be available by phone for the remainder of the treatment day.  4)  You May experience any of the following symptoms:  Headache, Nausea and a dry mouth (due to the medications you were given),  temporary memory loss and some confusion, or sore muscles (a warm bath  should help this).  If you you experience any of these symptoms let us know on                your return visit.  5)  Report any of the following: any acute discomfort, severe headache, or temperature        greater than 100.5 F.   Also report any unusual redness, swelling, drainage, or pain         at your IV site.    You may report Symptoms to:  ECT PROGRAM- [place] at [facility]          Phone: [phone], ECT Department           or Dr. Shary Key office [phone]  6)  Your next ECT Treatment is Wednesday March 11  We will call 2 days prior to your scheduled appointment for arrival times.  7)  Nothing to eat or drink after midnight the night before your procedure.  8)  Take     With a sip of water the morning of your procedure.  9)  Other Instructions: Call [phone] to cancel the morning of your procedure due         to illness or emergency.  10) We will call within 72 hours to assess how you are feeling.

## 2019-01-06 NOTE — Anesthesia Preprocedure Evaluation (Signed)
Anesthesia Evaluation  Patient identified by MRN, date of birth, ID band Patient awake    Reviewed: Allergy & Precautions, NPO status , Patient's Chart, lab work & pertinent test results  History of Anesthesia Complications Negative for: history of anesthetic complications  Airway Mallampati: III  TM Distance: <3 FB Neck ROM: limited    Dental  (+) Poor Dentition, Chipped, Missing   Pulmonary COPD,  COPD inhaler, Current Smoker,    breath sounds clear to auscultation- rhonchi (-) wheezing      Cardiovascular hypertension, Pt. on medications (-) CAD, (-) Past MI, (-) Cardiac Stents and (-) CABG + dysrhythmias (RBBB)  Rhythm:Regular Rate:Normal - Systolic murmurs and - Diastolic murmurs    Neuro/Psych  Headaches, Seizures -, Well Controlled,  PSYCHIATRIC DISORDERS Anxiety Depression Bipolar Disorder Schizophrenia  Neuromuscular disease    GI/Hepatic Neg liver ROS, GERD  ,  Endo/Other  negative endocrine ROSneg diabetes  Renal/GU Renal disease     Musculoskeletal negative musculoskeletal ROS (+)   Abdominal (+) - obese,   Peds  Hematology negative hematology ROS (+)   Anesthesia Other Findings    Reproductive/Obstetrics                             Anesthesia Physical  Anesthesia Plan  ASA: III  Anesthesia Plan: General   Post-op Pain Management:    Induction: Intravenous  PONV Risk Score and Plan: 0  Airway Management Planned: Mask  Additional Equipment:   Intra-op Plan:   Post-operative Plan:   Informed Consent: I have reviewed the patients History and Physical, chart, labs and discussed the procedure including the risks, benefits and alternatives for the proposed anesthesia with the patient or authorized representative who has indicated his/her understanding and acceptance.       Plan Discussed with: CRNA and Anesthesiologist  Anesthesia Plan Comments:          Anesthesia Quick Evaluation

## 2019-01-25 ENCOUNTER — Other Ambulatory Visit: Payer: Self-pay | Admitting: Psychiatry

## 2019-01-26 ENCOUNTER — Other Ambulatory Visit: Payer: Self-pay | Admitting: Psychiatry

## 2019-01-27 ENCOUNTER — Ambulatory Visit: Payer: Self-pay | Admitting: Anesthesiology

## 2019-01-27 ENCOUNTER — Ambulatory Visit
Admission: RE | Admit: 2019-01-27 | Discharge: 2019-01-27 | Disposition: A | Discharge status: Home / Self Care | Payer: Medicaid Other | Source: Ambulatory Visit | Attending: Psychiatry | Admitting: Psychiatry

## 2019-01-27 ENCOUNTER — Encounter: Payer: Self-pay | Admitting: Anesthesiology

## 2019-01-27 ENCOUNTER — Other Ambulatory Visit: Payer: Self-pay

## 2019-01-27 DIAGNOSIS — J449 Chronic obstructive pulmonary disease, unspecified: Secondary | ICD-10-CM | POA: Insufficient documentation

## 2019-01-27 DIAGNOSIS — Z888 Allergy status to other drugs, medicaments and biological substances status: Secondary | ICD-10-CM | POA: Diagnosis not present

## 2019-01-27 DIAGNOSIS — F17210 Nicotine dependence, cigarettes, uncomplicated: Secondary | ICD-10-CM | POA: Insufficient documentation

## 2019-01-27 DIAGNOSIS — Z885 Allergy status to narcotic agent status: Secondary | ICD-10-CM | POA: Diagnosis not present

## 2019-01-27 DIAGNOSIS — Z89429 Acquired absence of other toe(s), unspecified side: Secondary | ICD-10-CM | POA: Insufficient documentation

## 2019-01-27 DIAGNOSIS — I10 Essential (primary) hypertension: Secondary | ICD-10-CM | POA: Insufficient documentation

## 2019-01-27 DIAGNOSIS — F259 Schizoaffective disorder, unspecified: Secondary | ICD-10-CM | POA: Insufficient documentation

## 2019-01-27 DIAGNOSIS — Z880 Allergy status to penicillin: Secondary | ICD-10-CM | POA: Diagnosis not present

## 2019-01-27 DIAGNOSIS — F329 Major depressive disorder, single episode, unspecified: Secondary | ICD-10-CM | POA: Insufficient documentation

## 2019-01-27 DIAGNOSIS — F332 Major depressive disorder, recurrent severe without psychotic features: Secondary | ICD-10-CM

## 2019-01-27 MED ORDER — SODIUM CHLORIDE 0.9 % IV SOLN
INTRAVENOUS | Status: DC | PRN
  Administered 2019-01-27: 10:00:00 via INTRAVENOUS

## 2019-01-27 MED ORDER — SUCCINYLCHOLINE CHLORIDE 20 MG/ML IJ SOLN
INTRAMUSCULAR | Status: AC
  Filled 2019-01-27: qty 1

## 2019-01-27 MED ORDER — SUCCINYLCHOLINE CHLORIDE 200 MG/10ML IV SOSY
PREFILLED_SYRINGE | INTRAVENOUS | Status: DC | PRN
  Administered 2019-01-27: 90 mg via INTRAVENOUS

## 2019-01-27 MED ORDER — KETOROLAC TROMETHAMINE 30 MG/ML IJ SOLN
INTRAMUSCULAR | Status: AC
  Filled 2019-01-27: qty 1

## 2019-01-27 MED ORDER — GLYCOPYRROLATE 0.2 MG/ML IJ SOLN
INTRAMUSCULAR | Status: AC
  Filled 2019-01-27: qty 1

## 2019-01-27 MED ORDER — KETOROLAC TROMETHAMINE 30 MG/ML IJ SOLN
30.0000 mg | Freq: Once | INTRAMUSCULAR | Status: AC
  Administered 2019-01-27: 30 mg via INTRAVENOUS

## 2019-01-27 MED ORDER — GLYCOPYRROLATE 0.2 MG/ML IJ SOLN
0.1000 mg | Freq: Once | INTRAMUSCULAR | Status: AC
  Administered 2019-01-27: 0.1 mg via INTRAVENOUS

## 2019-01-27 MED ORDER — SODIUM CHLORIDE 0.9 % IV SOLN
500.0000 mL | Freq: Once | INTRAVENOUS | Status: AC
  Administered 2019-01-27: 500 mL via INTRAVENOUS

## 2019-01-27 MED ORDER — METHOHEXITAL SODIUM 100 MG/10ML IV SOSY
PREFILLED_SYRINGE | INTRAVENOUS | Status: DC | PRN
  Administered 2019-01-27: 70 mg via INTRAVENOUS
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
## 2019-01-27 NOTE — Discharge Instructions (Signed)
1)  The drugs that you have been given will stay in your system until tomorrow so for the       next 24 hours you should not:  A. Drive an automobile  B. Make any legal decisions  C. Drink any alcoholic beverages  2)  You may resume your regular meals upon return home.  3)  A responsible adult must take you home.  Someone should stay with you for a few          hours, then be available by phone for the remainder of the treatment day.  4)  You May experience any of the following symptoms:  Headache, Nausea and a dry mouth (due to the medications you were given),  temporary memory loss and some confusion, or sore muscles (a warm bath  should help this).  If you you experience any of these symptoms let us know on                your return visit.  5)  Report any of the following: any acute discomfort, severe headache, or temperature        greater than 100.5 F.   Also report any unusual redness, swelling, drainage, or pain         at your IV site.    You may report Symptoms to:  ECT PROGRAM- [place] at [facility]          Phone: [phone], ECT Department           or Dr. Shary Key office [phone]  6)  Your next ECT Treatment is Friday at 9:00   We will call 2 days prior to your scheduled appointment for arrival times.  7)  Nothing to eat or drink after midnight the night before your procedure.  8)  Take     With a sip of water the morning of your procedure.  9)  Other Instructions: Call [phone] to cancel the morning of your procedure due         to illness or emergency.  10) We will call within 72 hours to assess how you are feeling.

## 2019-01-27 NOTE — Procedures (Signed)
ECT SERVICES Physician's Interval Evaluation & Treatment Note  Patient Identification: Jason Patterson MRN:  [id number] Date of Evaluation:  01/27/2019 TX #: 9  MADRS:   MMSE:   P.E. Findings:  No change physical exam  Psychiatric Interval Note:  He reports that he is feeling much worse.  Talks about wanting to die and crying more often.  Subjective:  Patient is a 63 y.o. male seen for evaluation for Electroconvulsive Therapy. Says he is feeling more depressed and down  Treatment Summary:   [x]   Right Unilateral             []  Bilateral   % Energy : 0.3 ms 100%   Impedance: 1520 ohms  Seizure Energy Index: 8560 V squared  Postictal Suppression Index: 80%  Seizure Concordance Index: 97%  Medications  Pre Shock: Robinul 0.1 mg Toradol 30 mg Brevital 70 mg succinylcholine 100 mg  Post Shock:    Seizure Duration: 20 seconds EMG 44 seconds EEG   Comments: I would like to see him back this week if possible if not no more than 1 week out.  Lungs:  [x]   Clear to auscultation               []  Other:   Heart:    [x]   Regular rhythm             []  irregular rhythm    [x]   Previous H&P reviewed, patient examined and there are NO CHANGES                 []   Previous H&P reviewed, patient examined and there are changes noted.   [name], MD 3/11/202010:58 AM

## 2019-01-27 NOTE — Transfer of Care (Signed)
Immediate Anesthesia Transfer of Care Note  Patient: Jason Patterson  Procedure(s) Performed: ECT TX  Patient Location: PACU  Anesthesia Type:General  Level of Consciousness: sedated  Airway & Oxygen Therapy: Patient Spontanous Breathing and Patient connected to face mask oxygen  Post-op Assessment: Report given to RN and Post -op Vital signs reviewed and stable  Post vital signs: Reviewed and stable  Last Vitals:  Vitals Value Taken Time  BP 131/87 01/27/2019 11:16 AM  Temp 36.9 C 01/27/2019 11:15 AM  Pulse 73 01/27/2019 11:18 AM  Resp 21 01/27/2019 11:18 AM  SpO2 97 % 01/27/2019 11:18 AM  Vitals shown include unvalidated device data.  Last Pain:  Vitals:   01/27/19 1115  TempSrc:   PainSc: 0-No pain         Complications: No apparent anesthesia complications

## 2019-01-27 NOTE — Anesthesia Post-op Follow-up Note (Signed)
Anesthesia QCDR form completed.        

## 2019-01-27 NOTE — Anesthesia Preprocedure Evaluation (Signed)
Anesthesia Evaluation  Patient identified by MRN, date of birth, ID band Patient awake    Reviewed: Allergy & Precautions, NPO status , Patient's Chart, lab work & pertinent test results  History of Anesthesia Complications Negative for: history of anesthetic complications  Airway Mallampati: III  TM Distance: >3 FB Neck ROM: Full    Dental  (+) Poor Dentition   Pulmonary COPD,  COPD inhaler, Current Smoker,    breath sounds clear to auscultation- rhonchi (-) wheezing      Cardiovascular hypertension, Pt. on medications (-) CAD, (-) Past MI, (-) Cardiac Stents and (-) CABG Dysrhythmias: RBBB.  Rhythm:Regular Rate:Normal - Systolic murmurs and - Diastolic murmurs    Neuro/Psych  Headaches, Seizures -, Well Controlled,  PSYCHIATRIC DISORDERS Anxiety Depression Bipolar Disorder Schizophrenia    GI/Hepatic Neg liver ROS, GERD  ,  Endo/Other  negative endocrine ROSneg diabetes  Renal/GU negative Renal ROS     Musculoskeletal negative musculoskeletal ROS (+)   Abdominal (+) - obese,   Peds  Hematology negative hematology ROS (+)   Anesthesia Other Findings Past Medical History: No date: Anxiety No date: Bipolar disorder, unspecified (HCC) No date: Coarse tremors No date: COPD (chronic obstructive pulmonary disease) (HCC) No date: Depression No date: Diastolic dysfunction No date: Dysrhythmia No date: Essential hypertension, benign No date: Generalized anxiety disorder No date: GERD (gastroesophageal reflux disease) No date: Headache(784.0) No date: Physical deconditioning No date: Psoriasis No date: Rhabdomyolysis No date: Schizoaffective disorder, unspecified condition No date: Seizure disorder (HCC) No date: Seizures (HCC)     Comment:  NONE SINCE AGE 41 No date: Sepsis (HCC) No date: Shortness of breath     Comment:  W/ EXERTION    Reproductive/Obstetrics                              Anesthesia Physical  Anesthesia Plan  ASA: III  Anesthesia Plan: General   Post-op Pain Management:    Induction: Intravenous  PONV Risk Score and Plan: 0  Airway Management Planned: Mask  Additional Equipment:   Intra-op Plan:   Post-operative Plan:   Informed Consent: I have reviewed the patients History and Physical, chart, labs and discussed the procedure including the risks, benefits and alternatives for the proposed anesthesia with the patient or authorized representative who has indicated his/her understanding and acceptance.     Dental advisory given  Plan Discussed with: CRNA and Anesthesiologist  Anesthesia Plan Comments:         Anesthesia Quick Evaluation  

## 2019-01-28 ENCOUNTER — Other Ambulatory Visit: Payer: Self-pay | Admitting: Psychiatry

## 2019-01-28 NOTE — Anesthesia Postprocedure Evaluation (Signed)
Anesthesia Post Note  Patient: Jason Patterson  Procedure(s) Performed: ECT TX  Patient location during evaluation: PACU Anesthesia Type: General Level of consciousness: awake and alert and oriented Pain management: pain level controlled Vital Signs Assessment: post-procedure vital signs reviewed and stable Respiratory status: spontaneous breathing, nonlabored ventilation and respiratory function stable Cardiovascular status: blood pressure returned to baseline and stable Postop Assessment: no signs of nausea or vomiting Anesthetic complications: no     Last Vitals:  Vitals:   01/27/19 1138 01/27/19 1139  BP: 130/88 128/82  Pulse: 73 73  Resp: 18 16  Temp:  36.4 C  SpO2:      Last Pain:  Vitals:   01/27/19 1139  TempSrc: Oral  PainSc: 0-No pain                 Derra Shartzer

## 2019-01-29 ENCOUNTER — Encounter
Admission: RE | Admit: 2019-01-29 | Discharge: 2019-01-29 | Disposition: A | Discharge status: Home / Self Care | Payer: Medicaid Other | Source: Ambulatory Visit | Attending: Psychiatry | Admitting: Psychiatry

## 2019-01-29 ENCOUNTER — Other Ambulatory Visit: Payer: Self-pay

## 2019-01-29 ENCOUNTER — Encounter: Payer: Self-pay | Admitting: Certified Registered"

## 2019-01-29 ENCOUNTER — Telehealth: Payer: Self-pay

## 2019-01-29 DIAGNOSIS — Z79899 Other long term (current) drug therapy: Secondary | ICD-10-CM | POA: Diagnosis not present

## 2019-01-29 DIAGNOSIS — J449 Chronic obstructive pulmonary disease, unspecified: Secondary | ICD-10-CM | POA: Diagnosis not present

## 2019-01-29 DIAGNOSIS — Z880 Allergy status to penicillin: Secondary | ICD-10-CM | POA: Diagnosis not present

## 2019-01-29 DIAGNOSIS — F17210 Nicotine dependence, cigarettes, uncomplicated: Secondary | ICD-10-CM | POA: Diagnosis not present

## 2019-01-29 DIAGNOSIS — N289 Disorder of kidney and ureter, unspecified: Secondary | ICD-10-CM | POA: Diagnosis not present

## 2019-01-29 DIAGNOSIS — K219 Gastro-esophageal reflux disease without esophagitis: Secondary | ICD-10-CM | POA: Insufficient documentation

## 2019-01-29 DIAGNOSIS — F259 Schizoaffective disorder, unspecified: Secondary | ICD-10-CM | POA: Insufficient documentation

## 2019-01-29 DIAGNOSIS — I10 Essential (primary) hypertension: Secondary | ICD-10-CM | POA: Insufficient documentation

## 2019-01-29 DIAGNOSIS — F419 Anxiety disorder, unspecified: Secondary | ICD-10-CM | POA: Diagnosis not present

## 2019-01-29 DIAGNOSIS — F329 Major depressive disorder, single episode, unspecified: Secondary | ICD-10-CM | POA: Diagnosis not present

## 2019-01-29 DIAGNOSIS — G40909 Epilepsy, unspecified, not intractable, without status epilepticus: Secondary | ICD-10-CM | POA: Diagnosis not present

## 2019-01-29 DIAGNOSIS — Z888 Allergy status to other drugs, medicaments and biological substances status: Secondary | ICD-10-CM | POA: Insufficient documentation

## 2019-01-29 MED ORDER — KETOROLAC TROMETHAMINE 30 MG/ML IJ SOLN
INTRAMUSCULAR | Status: AC
  Administered 2019-01-29: 30 mg via INTRAVENOUS
  Filled 2019-01-29: qty 1

## 2019-01-29 MED ORDER — METHOHEXITAL SODIUM 100 MG/10ML IV SOSY
PREFILLED_SYRINGE | INTRAVENOUS | Status: DC | PRN
  Administered 2019-01-29: 70 mg via INTRAVENOUS

## 2019-01-29 MED ORDER — SUCCINYLCHOLINE CHLORIDE 20 MG/ML IJ SOLN
INTRAMUSCULAR | Status: DC | PRN
  Administered 2019-01-29: 90 mg via INTRAVENOUS

## 2019-01-29 MED ORDER — KETOROLAC TROMETHAMINE 30 MG/ML IJ SOLN
30.0000 mg | Freq: Once | INTRAMUSCULAR | Status: AC
  Administered 2019-01-29: 30 mg via INTRAVENOUS

## 2019-01-29 MED ORDER — GLYCOPYRROLATE 0.2 MG/ML IJ SOLN
INTRAMUSCULAR | Status: AC
  Administered 2019-01-29: 0.1 mg via INTRAVENOUS
  Filled 2019-01-29: qty 1

## 2019-01-29 MED ORDER — SODIUM CHLORIDE 0.9 % IV SOLN
500.0000 mL | Freq: Once | INTRAVENOUS | Status: AC
  Administered 2019-01-29: 500 mL via INTRAVENOUS

## 2019-01-29 MED ORDER — SODIUM CHLORIDE 0.9 % IV SOLN
INTRAVENOUS | Status: DC | PRN
  Administered 2019-01-29: 12:00:00 via INTRAVENOUS

## 2019-01-29 MED ORDER — GLYCOPYRROLATE 0.2 MG/ML IJ SOLN
0.1000 mg | Freq: Once | INTRAMUSCULAR | Status: AC
  Administered 2019-01-29: 0.1 mg via INTRAVENOUS

## 2019-01-29 NOTE — Anesthesia Preprocedure Evaluation (Signed)
Anesthesia Evaluation  Patient identified by MRN, date of birth, ID band Patient awake    Reviewed: Allergy & Precautions, NPO status , Patient's Chart, lab work & pertinent test results  History of Anesthesia Complications Negative for: history of anesthetic complications  Airway Mallampati: III  TM Distance: >3 FB Neck ROM: Full    Dental  (+) Poor Dentition   Pulmonary COPD,  COPD inhaler, Current Smoker,    breath sounds clear to auscultation- rhonchi (-) wheezing      Cardiovascular hypertension, Pt. on medications (-) CAD, (-) Past MI, (-) Cardiac Stents and (-) CABG (-) dysrhythmias (RBBB)  Rhythm:Regular Rate:Normal - Systolic murmurs and - Diastolic murmurs    Neuro/Psych  Headaches, Seizures -, Well Controlled,  PSYCHIATRIC DISORDERS Anxiety Depression Bipolar Disorder Schizophrenia    GI/Hepatic Neg liver ROS, GERD  ,  Endo/Other  negative endocrine ROSneg diabetes  Renal/GU negative Renal ROS     Musculoskeletal negative musculoskeletal ROS (+)   Abdominal (+) - obese,   Peds  Hematology negative hematology ROS (+)   Anesthesia Other Findings Past Medical History: No date: Anxiety No date: Bipolar disorder, unspecified (HCC) No date: Coarse tremors No date: COPD (chronic obstructive pulmonary disease) (HCC) No date: Depression No date: Diastolic dysfunction No date: Dysrhythmia No date: Essential hypertension, benign No date: Generalized anxiety disorder No date: GERD (gastroesophageal reflux disease) No date: Headache(784.0) No date: Physical deconditioning No date: Psoriasis No date: Rhabdomyolysis No date: Schizoaffective disorder, unspecified condition No date: Seizure disorder (HCC) No date: Seizures (HCC)     Comment:  NONE SINCE AGE 2 No date: Sepsis (HCC) No date: Shortness of breath     Comment:  W/ EXERTION    Reproductive/Obstetrics                              Anesthesia Physical  Anesthesia Plan  ASA: III  Anesthesia Plan: General   Post-op Pain Management:    Induction: Intravenous  PONV Risk Score and Plan: 0  Airway Management Planned: Mask  Additional Equipment:   Intra-op Plan:   Post-operative Plan:   Informed Consent: I have reviewed the patients History and Physical, chart, labs and discussed the procedure including the risks, benefits and alternatives for the proposed anesthesia with the patient or authorized representative who has indicated his/her understanding and acceptance.     Dental advisory given  Plan Discussed with: CRNA and Anesthesiologist  Anesthesia Plan Comments:         Anesthesia Quick Evaluation

## 2019-01-29 NOTE — Transfer of Care (Signed)
Immediate Anesthesia Transfer of Care Note  Patient: Jason Patterson  Procedure(s) Performed: ECT TX  Patient Location: PACU  Anesthesia Type:General  Level of Consciousness: awake and alert   Airway & Oxygen Therapy: Patient Spontanous Breathing and Patient connected to face mask oxygen  Post-op Assessment: Report given to RN and Post -op Vital signs reviewed and stable  Post vital signs: Reviewed and stable  Last Vitals:  Vitals Value Taken Time  BP    Temp    Pulse    Resp    SpO2      Last Pain:  Vitals:   01/29/19 0942  TempSrc: Oral  PainSc:          Complications: No apparent anesthesia complications
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
## 2019-01-29 NOTE — Discharge Instructions (Signed)
1)  The drugs that you have been given will stay in your system until tomorrow so for the       next 24 hours you should not:  A. Drive an automobile  B. Make any legal decisions  C. Drink any alcoholic beverages  2)  You may resume your regular meals upon return home.  3)  A responsible adult must take you home.  Someone should stay with you for a few          hours, then be available by phone for the remainder of the treatment day.  4)  You May experience any of the following symptoms:  Headache, Nausea and a dry mouth (due to the medications you were given),  temporary memory loss and some confusion, or sore muscles (a warm bath  should help this).  If you you experience any of these symptoms let us know on                your return visit.  5)  Report any of the following: any acute discomfort, severe headache, or temperature        greater than 100.5 F.   Also report any unusual redness, swelling, drainage, or pain         at your IV site.    You may report Symptoms to:  ECT PROGRAM- [place] [facility]          Phone: [phone], ECT Department           or Dr. Shary Key office [phone]  6)  Your next ECT Treatment is Wednesday 18   We will call 2 days prior to your scheduled appointment for arrival times.  7)  Nothing to eat or drink after midnight the night before your procedure.  8)  Take      With a sip of water the morning of your procedure.  9)  Other Instructions: Call [phone] to cancel the morning of your procedure due         to illness or emergency.  10) We will call within 72 hours to assess how you are feeling.

## 2019-01-29 NOTE — Anesthesia Post-op Follow-up Note (Signed)
Anesthesia QCDR form completed.        

## 2019-01-29 NOTE — Anesthesia Procedure Notes (Signed)
Procedure Name: MAC Date/Time: 01/29/2019 11:47 AM Performed by: Chanetta Marshall, CRNA Pre-anesthesia Checklist: Patient identified, Emergency Drugs available, Suction available, Patient being monitored and Timeout performed Patient Re-evaluated:Patient Re-evaluated prior to induction Oxygen Delivery Method: Circle system utilized Preoxygenation: Pre-oxygenation with 100% oxygen Induction Type: IV induction Ventilation: Mask ventilation without difficulty Airway Equipment and Method: Bite block Dental Injury: Teeth and Oropharynx as per pre-operative assessment

## 2019-01-31 NOTE — Anesthesia Postprocedure Evaluation (Signed)
Anesthesia Post Note  Patient: Jason Patterson  Procedure(s) Performed: ECT TX  Patient location during evaluation: PACU Anesthesia Type: General Level of consciousness: awake and alert Pain management: pain level controlled Vital Signs Assessment: post-procedure vital signs reviewed and stable Respiratory status: spontaneous breathing, nonlabored ventilation, respiratory function stable and patient connected to nasal cannula oxygen Cardiovascular status: blood pressure returned to baseline and stable Postop Assessment: no apparent nausea or vomiting Anesthetic complications: no     Last Vitals:  Vitals:   01/29/19 1215 01/29/19 1246  BP:  102/75  Pulse:  79  Resp:  16  Temp: 36.4 C 36.4 C  SpO2:      Last Pain:  Vitals:   01/29/19 1246  TempSrc: Oral  PainSc: 0-No pain                 Lenard Simmer

## 2019-02-01 ENCOUNTER — Telehealth: Payer: Self-pay

## 2019-02-05 ENCOUNTER — Telehealth: Payer: Self-pay

## 2019-02-09 ENCOUNTER — Other Ambulatory Visit: Payer: Self-pay | Admitting: Psychiatry

## 2019-02-09 NOTE — Plan of Care (Signed)
Chart reviewed. Patient with history of depression, currently receiving outpatient ECT treatment, however depression worsening with suicidal ideation. Plan for admission to inpatient psychiatry with continuation of ECT if indicated, per Dr. Toni Amend. We will place patient n.p.o. overnight. Admission orders placed.  Mariel Craft, MD

## 2019-02-10 ENCOUNTER — Inpatient Hospital Stay
Admission: AD | Admit: 2019-02-10 | Discharge: 2019-02-15 | DRG: 885 | Disposition: A | Discharge status: Home / Self Care | Payer: Medicaid Other | Attending: Psychiatry | Admitting: Psychiatry

## 2019-02-10 ENCOUNTER — Encounter: Payer: Self-pay | Admitting: Anesthesiology

## 2019-02-10 ENCOUNTER — Other Ambulatory Visit: Payer: Self-pay

## 2019-02-10 ENCOUNTER — Inpatient Hospital Stay: Admission: RE | Admit: 2019-02-10 | Payer: Self-pay | Source: Ambulatory Visit

## 2019-02-10 ENCOUNTER — Telehealth: Payer: Self-pay

## 2019-02-10 DIAGNOSIS — F17210 Nicotine dependence, cigarettes, uncomplicated: Secondary | ICD-10-CM | POA: Diagnosis present

## 2019-02-10 DIAGNOSIS — E559 Vitamin D deficiency, unspecified: Secondary | ICD-10-CM | POA: Diagnosis present

## 2019-02-10 DIAGNOSIS — H4089 Other specified glaucoma: Secondary | ICD-10-CM | POA: Diagnosis present

## 2019-02-10 DIAGNOSIS — I208 Other forms of angina pectoris: Secondary | ICD-10-CM | POA: Diagnosis present

## 2019-02-10 DIAGNOSIS — Z7982 Long term (current) use of aspirin: Secondary | ICD-10-CM | POA: Diagnosis not present

## 2019-02-10 DIAGNOSIS — K219 Gastro-esophageal reflux disease without esophagitis: Secondary | ICD-10-CM | POA: Diagnosis present

## 2019-02-10 DIAGNOSIS — G40909 Epilepsy, unspecified, not intractable, without status epilepticus: Secondary | ICD-10-CM | POA: Diagnosis present

## 2019-02-10 DIAGNOSIS — R45851 Suicidal ideations: Secondary | ICD-10-CM | POA: Diagnosis present

## 2019-02-10 DIAGNOSIS — Z89422 Acquired absence of other left toe(s): Secondary | ICD-10-CM | POA: Diagnosis not present

## 2019-02-10 DIAGNOSIS — Z880 Allergy status to penicillin: Secondary | ICD-10-CM | POA: Diagnosis not present

## 2019-02-10 DIAGNOSIS — F259 Schizoaffective disorder, unspecified: Secondary | ICD-10-CM | POA: Diagnosis present

## 2019-02-10 DIAGNOSIS — J449 Chronic obstructive pulmonary disease, unspecified: Secondary | ICD-10-CM | POA: Diagnosis present

## 2019-02-10 DIAGNOSIS — Z888 Allergy status to other drugs, medicaments and biological substances status: Secondary | ICD-10-CM

## 2019-02-10 DIAGNOSIS — F251 Schizoaffective disorder, depressive type: Secondary | ICD-10-CM | POA: Diagnosis present

## 2019-02-10 DIAGNOSIS — F419 Anxiety disorder, unspecified: Secondary | ICD-10-CM | POA: Diagnosis present

## 2019-02-10 DIAGNOSIS — Z79899 Other long term (current) drug therapy: Secondary | ICD-10-CM

## 2019-02-10 DIAGNOSIS — I10 Essential (primary) hypertension: Secondary | ICD-10-CM | POA: Diagnosis present

## 2019-02-10 DIAGNOSIS — F332 Major depressive disorder, recurrent severe without psychotic features: Secondary | ICD-10-CM | POA: Diagnosis not present

## 2019-02-10 DIAGNOSIS — R569 Unspecified convulsions: Secondary | ICD-10-CM

## 2019-02-10 MED ORDER — LEVETIRACETAM 500 MG PO TABS
1000.0000 mg | ORAL_TABLET | Freq: Two times a day (BID) | ORAL | Status: DC
  Administered 2019-02-10 – 2019-02-15 (×10): 1000 mg via ORAL
  Filled 2019-02-10 (×10): qty 2

## 2019-02-10 MED ORDER — MAGNESIUM HYDROXIDE 400 MG/5ML PO SUSP: 30.0000 mL | Freq: Every day | ORAL | Status: DC | PRN

## 2019-02-10 MED ORDER — GLYCOPYRROLATE 0.2 MG/ML IJ SOLN: 0.1000 mg | Freq: Once | INTRAMUSCULAR | Status: DC

## 2019-02-10 MED ORDER — ACETAMINOPHEN 325 MG PO TABS
650.0000 mg | ORAL_TABLET | Freq: Four times a day (QID) | ORAL | Status: DC | PRN
  Administered 2019-02-12 – 2019-02-15 (×3): 650 mg via ORAL
  Filled 2019-02-10 (×3): qty 2

## 2019-02-10 MED ORDER — ALBUTEROL SULFATE HFA 108 (90 BASE) MCG/ACT IN AERS
1.0000 | INHALATION_SPRAY | Freq: Four times a day (QID) | RESPIRATORY_TRACT | Status: DC | PRN
  Filled 2019-02-10: qty 6.7

## 2019-02-10 MED ORDER — ADULT MULTIVITAMIN W/MINERALS CH
1.0000 | ORAL_TABLET | Freq: Every day | ORAL | Status: DC
  Administered 2019-02-11 – 2019-02-15 (×5): 1 via ORAL
  Filled 2019-02-10 (×5): qty 1

## 2019-02-10 MED ORDER — VITAMIN D3 25 MCG (1000 UNIT) PO TABS
2000.0000 [IU] | ORAL_TABLET | Freq: Every day | ORAL | Status: DC
  Administered 2019-02-11 – 2019-02-15 (×5): 2000 [IU] via ORAL
  Filled 2019-02-10 (×10): qty 2

## 2019-02-10 MED ORDER — FAMOTIDINE 20 MG PO TABS
10.0000 mg | ORAL_TABLET | Freq: Two times a day (BID) | ORAL | Status: DC
  Administered 2019-02-10 – 2019-02-15 (×10): 10 mg via ORAL
  Filled 2019-02-10 (×10): qty 1

## 2019-02-10 MED ORDER — SERTRALINE HCL 100 MG PO TABS
100.0000 mg | ORAL_TABLET | Freq: Every day | ORAL | Status: DC
  Administered 2019-02-11 – 2019-02-15 (×5): 100 mg via ORAL
  Filled 2019-02-10 (×5): qty 1

## 2019-02-10 MED ORDER — TAMSULOSIN HCL 0.4 MG PO CAPS
0.4000 mg | ORAL_CAPSULE | Freq: Every day | ORAL | Status: DC
  Administered 2019-02-11 – 2019-02-15 (×5): 0.4 mg via ORAL
  Filled 2019-02-10 (×5): qty 1

## 2019-02-10 MED ORDER — ALUM & MAG HYDROXIDE-SIMETH 200-200-20 MG/5ML PO SUSP: 30.0000 mL | ORAL | Status: DC | PRN

## 2019-02-10 MED ORDER — ASPIRIN EC 81 MG PO TBEC
81.0000 mg | DELAYED_RELEASE_TABLET | Freq: Every day | ORAL | Status: DC
  Administered 2019-02-11 – 2019-02-15 (×5): 81 mg via ORAL
  Filled 2019-02-10 (×5): qty 1

## 2019-02-10 MED ORDER — B COMPLEX-C PO TABS
1.0000 | ORAL_TABLET | Freq: Every day | ORAL | Status: DC
  Administered 2019-02-11 – 2019-02-15 (×5): 1 via ORAL
  Filled 2019-02-10 (×6): qty 1

## 2019-02-10 MED ORDER — KETOROLAC TROMETHAMINE 30 MG/ML IJ SOLN: 30.0000 mg | Freq: Once | INTRAMUSCULAR | Status: AC

## 2019-02-10 MED ORDER — LOXAPINE SUCCINATE 5 MG PO CAPS
25.0000 mg | ORAL_CAPSULE | Freq: Three times a day (TID) | ORAL | Status: DC
  Administered 2019-02-10 – 2019-02-15 (×13): 25 mg via ORAL
  Filled 2019-02-10 (×13): qty 1

## 2019-02-10 MED ORDER — METOPROLOL TARTRATE 25 MG PO TABS
12.5000 mg | ORAL_TABLET | Freq: Two times a day (BID) | ORAL | Status: DC
  Administered 2019-02-10 – 2019-02-15 (×8): 12.5 mg via ORAL
  Filled 2019-02-10 (×10): qty 1

## 2019-02-10 MED ORDER — LATANOPROST 0.005 % OP SOLN
1.0000 [drp] | Freq: Every day | OPHTHALMIC | Status: DC
  Administered 2019-02-10 – 2019-02-13 (×3): 1 [drp] via OPHTHALMIC
  Filled 2019-02-10: qty 2.5

## 2019-02-10 MED ORDER — SODIUM CHLORIDE 0.9 % IV SOLN
500.0000 mL | Freq: Once | INTRAVENOUS | Status: AC
  Administered 2019-02-15: 11:00:00 via INTRAVENOUS

## 2019-02-10 MED ORDER — OLANZAPINE 5 MG PO TABS
15.0000 mg | ORAL_TABLET | Freq: Every day | ORAL | Status: DC
  Administered 2019-02-10 – 2019-02-14 (×5): 15 mg via ORAL
  Filled 2019-02-10 (×5): qty 1

## 2019-02-10 NOTE — Anesthesia Preprocedure Evaluation (Deleted)
Anesthesia Evaluation  Patient identified by MRN, date of birth, ID band Patient awake    Reviewed: Allergy & Precautions, NPO status , Patient's Chart, lab work & pertinent test results  History of Anesthesia Complications Negative for: history of anesthetic complications  Airway Mallampati: III  TM Distance: >3 FB Neck ROM: Full    Dental  (+) Poor Dentition   Pulmonary COPD,  COPD inhaler, Current Smoker,           Cardiovascular hypertension, Pt. on medications (-) CAD, (-) Past MI, (-) Cardiac Stents and (-) CABG (-) dysrhythmias (RBBB)  - Systolic murmurs    Neuro/Psych  Headaches, Seizures -, Well Controlled,  PSYCHIATRIC DISORDERS Anxiety Depression Bipolar Disorder Schizophrenia    GI/Hepatic Neg liver ROS, GERD  ,  Endo/Other  negative endocrine ROSneg diabetes  Renal/GU CRFRenal disease     Musculoskeletal negative musculoskeletal ROS (+)   Abdominal (+) - obese,   Peds  Hematology negative hematology ROS (+)   Anesthesia Other Findings Past Medical History: No date: Anxiety No date: Bipolar disorder, unspecified (HCC) No date: Coarse tremors No date: COPD (chronic obstructive pulmonary disease) (HCC) No date: Depression No date: Diastolic dysfunction No date: Dysrhythmia No date: Essential hypertension, benign No date: Generalized anxiety disorder No date: GERD (gastroesophageal reflux disease) No date: Headache(784.0) No date: Physical deconditioning No date: Psoriasis No date: Rhabdomyolysis No date: Schizoaffective disorder, unspecified condition No date: Seizure disorder (HCC) No date: Seizures (HCC)     Comment:  NONE SINCE AGE 24 No date: Sepsis (HCC) No date: Shortness of breath     Comment:  W/ EXERTION    Reproductive/Obstetrics                             Anesthesia Physical  Anesthesia Plan  ASA: III  Anesthesia Plan: General   Post-op Pain  Management:    Induction: Intravenous  PONV Risk Score and Plan:   Airway Management Planned: Mask  Additional Equipment:   Intra-op Plan:   Post-operative Plan:   Informed Consent: I have reviewed the patients History and Physical, chart, labs and discussed the procedure including the risks, benefits and alternatives for the proposed anesthesia with the patient or authorized representative who has indicated his/her understanding and acceptance.     Dental advisory given  Plan Discussed with: CRNA and Anesthesiologist  Anesthesia Plan Comments:         Anesthesia Quick Evaluation
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
## 2019-02-10 NOTE — Tx Team (Addendum)
Interdisciplinary Treatment and Diagnostic Plan Update  02/10/2019 Time of Session: 230p Jason Patterson MRN: [id number]  Principal Diagnosis: <principal problem not specified>  Secondary Diagnoses: Active Problems:   MDD (major depressive disorder), recurrent severe, without psychosis (HCC)   Current Medications:  Current Facility-Administered Medications  Medication Dose Route Frequency Provider Last Rate Last Dose  . 0.9 %  sodium chloride infusion  500 mL Intravenous Once Clapacs, John T, MD      . acetaminophen (TYLENOL) tablet 650 mg  650 mg Oral Q6H PRN Mariel Craft, MD      . albuterol (PROVENTIL HFA;VENTOLIN HFA) 108 (90 Base) MCG/ACT inhaler 1 puff  1 puff Inhalation Q6H PRN Mariel Craft, MD      . alum & mag hydroxide-simeth (MAALOX/MYLANTA) 200-200-20 MG/5ML suspension 30 mL  30 mL Oral Q4H PRN Mariel Craft, MD      . aspirin EC tablet 81 mg  81 mg Oral Daily Mariel Craft, MD      . B-complex with vitamin C tablet 1 tablet  1 tablet Oral Daily Mariel Craft, MD      . cholecalciferol (VITAMIN D) tablet 2,000 Units  2,000 Units Oral Daily Mariel Craft, MD      . famotidine (PEPCID) tablet 10 mg  10 mg Oral BID Mariel Craft, MD      . glycopyrrolate (ROBINUL) injection 0.1 mg  0.1 mg Intravenous Once Clapacs, John T, MD      . ketorolac (TORADOL) 30 MG/ML injection 30 mg  30 mg Intravenous Once Clapacs, John T, MD      . latanoprost (XALATAN) 0.005 % ophthalmic solution 1 drop  1 drop Both Eyes QHS Mariel Craft, MD      . levETIRAcetam (KEPPRA) tablet 1,000 mg  1,000 mg Oral BID Mariel Craft, MD      . loxapine Ocie Bob) capsule 25 mg  25 mg Oral TID Mariel Craft, MD      . magnesium hydroxide (MILK OF MAGNESIA) suspension 30 mL  30 mL Oral Daily PRN Mariel Craft, MD      . metoprolol tartrate (LOPRESSOR) tablet 12.5 mg  12.5 mg Oral BID Mariel Craft, MD      . multivitamin with minerals tablet 1 tablet  1 tablet Oral Daily  Mariel Craft, MD      . OLANZapine [facility]) tablet 15 mg  15 mg Oral QHS Mariel Craft, MD      . sertraline (ZOLOFT) tablet 100 mg  100 mg Oral Daily Mariel Craft, MD      . tamsulosin [facility]) capsule 0.4 mg  0.4 mg Oral Daily Mariel Craft, MD       PTA Medications: Medications Prior to Admission  Medication Sig Dispense Refill Last Dose  . albuterol (PROAIR HFA) 108 (90 Base) MCG/ACT inhaler Inhale 1 puff into the lungs every 6 (six) hours as needed for wheezing or shortness of breath. 1 Inhaler 1 01/28/2019  . aspirin EC 81 MG tablet Take 1 tablet (81 mg total) by mouth daily. 30 tablet 1 01/28/2019  . B Complex-C (B-COMPLEX WITH VITAMIN C) tablet TAKE 1 TABLET BY MOUTH EACH DAY. 29 tablet 10 01/28/2019  . cholecalciferol (VITAMIN D) 25 MCG (1000 UT) tablet Take 2 tablets (2,000 Units total) by mouth daily. 60 tablet 1 01/28/2019  . famotidine (PEPCID) 10 MG tablet Take 1 tablet (10 mg total) by mouth 2 (two) times daily. 60 tablet  1 01/28/2019  . latanoprost (XALATAN) 0.005 % ophthalmic solution PLACE 1 DROP INTO BOTH EYES AT BEDTIME *REFRIGERATE BEFORE OPEN* *DATE OPEN__________* *DISCARD 6WKS AFTER OPEN* 2.5 mL 10 01/28/2019  . levETIRAcetam (KEPPRA) 500 MG tablet Take 2 tablets (1,000 mg total) by mouth 2 (two) times daily. 120 tablet 1 01/28/2019  . loxapine (LOXITANE) 25 MG capsule Take 1 capsule (25 mg total) by mouth 3 (three) times daily. 90 capsule 1 01/28/2019  . metoprolol tartrate (LOPRESSOR) 25 MG tablet Take 0.5 tablets (12.5 mg total) by mouth 2 (two) times daily. 30 tablet 1 01/28/2019  . Multiple Vitamin (MULTIVITAMIN WITH MINERALS) TABS tablet Take 1 tablet by mouth daily. 30 tablet 1 01/28/2019  . OLANZapine (ZYPREXA) 15 MG tablet TAKE ONE TABLET BY MOUTH AT BEDTIME. 29 tablet 10 01/28/2019  . sertraline (ZOLOFT) 100 MG tablet TAKE 1 TABLET BY MOUTH EACH DAY. 29 tablet 10 01/28/2019  . tamsulosin (FLOMAX) 0.4 MG CAPS capsule Take 1 capsule (0.4 mg total) by mouth  daily. 30 capsule 1 01/28/2019    Patient Stressors: Health problems Loss of independance   Patient Strengths: Ability for insight Communication skills Supportive family/friends  Treatment Modalities: Medication Management, Group therapy, Case management,  1 to 1 session with clinician, Psychoeducation, Recreational therapy.   Physician Treatment Plan for Primary Diagnosis: <principal problem not specified> Long Term Goal(s):     Short Term Goals:    Medication Management: Evaluate patient's response, side effects, and tolerance of medication regimen.  Therapeutic Interventions: 1 to 1 sessions, Unit Group sessions and Medication administration.  Evaluation of Outcomes: Progressing  Physician Treatment Plan for Secondary Diagnosis: Active Problems:   MDD (major depressive disorder), recurrent severe, without psychosis (HCC)  Long Term Goal(s):     Short Term Goals:       Medication Management: Evaluate patient's response, side effects, and tolerance of medication regimen.  Therapeutic Interventions: 1 to 1 sessions, Unit Group sessions and Medication administration.  Evaluation of Outcomes: Progressing   RN Treatment Plan for Primary Diagnosis: <principal problem not specified> Long Term Goal(s): Knowledge of disease and therapeutic regimen to maintain health will improve  Short Term Goals: Ability to participate in decision making will improve, Ability to verbalize feelings will improve, Ability to disclose and discuss suicidal ideas, Ability to identify and develop effective coping behaviors will improve and Compliance with prescribed medications will improve  Medication Management: RN will administer medications as ordered by provider, will assess and evaluate patient's response and provide education to patient for prescribed medication. RN will report any adverse and/or side effects to prescribing provider.  Therapeutic Interventions: 1 on 1 counseling sessions,  Psychoeducation, Medication administration, Evaluate responses to treatment, Monitor vital signs and CBGs as ordered, Perform/monitor CIWA, COWS, AIMS and Fall Risk screenings as ordered, Perform wound care treatments as ordered.  Evaluation of Outcomes: Progressing   LCSW Treatment Plan for Primary Diagnosis: <principal problem not specified> Long Term Goal(s): Safe transition to appropriate next level of care at discharge, Engage patient in therapeutic group addressing interpersonal concerns.  Short Term Goals: Engage patient in aftercare planning with referrals and resources  Therapeutic Interventions: Assess for all discharge needs, 1 to 1 time with Social worker, Explore available resources and support systems, Assess for adequacy in community support network, Educate family and significant other(s) on suicide prevention, Complete Psychosocial Assessment, Interpersonal group therapy.  Evaluation of Outcomes: Progressing   Progress in Treatment: Attending groups: Yes. Participating in groups: Yes. Taking medication as prescribed: Yes. Toleration medication:  Yes. Family/Significant other contact made: No, will contact:  CSW will contact legal guardian Patient understands diagnosis: No. Discussing patient identified problems/goals with staff: Yes. Medical problems stabilized or resolved: No. Denies suicidal/homicidal ideation: Yes. Issues/concerns per patient self-inventory: Yes.. Other: NA  New problem(s) identified: Yes, Describe:  Pt request to be transferred to a new group home  New Short Term/Long Term Goal(s): "Get me moved to another place"  Patient Goals:  "Get me moved to another place"  Discharge Plan or Barriers: Pt will return home and follow up with outpatient treatment. Pt also receives ECT  Reason for Continuation of Hospitalization: Medication stabilization  Estimated Length of Stay: 3-5 days  Recreational Therapy: Patient Stressors: N/A Patient  Goal: Patient will engage in groups without prompting or encouragement from LRT x3 group sessions within 5 recreation therapy group sessions  Attendees: Patient:Jason Patterson 02/10/2019 3:43 PM  Physician: [name] 02/10/2019 3:43 PM  Nursing:  02/10/2019 3:43 PM  RN Care Manager: 02/10/2019 3:43 PM  Social Worker: Ferd Hibbs 02/10/2019 3:43 PM  Recreational Therapist: Garret Reddish 02/10/2019 3:43 PM  Other:  02/10/2019 3:43 PM  Other:  02/10/2019 3:43 PM  Other: 02/10/2019 3:43 PM    Scribe for Treatment Team: Suzan Slick, LCSW 02/10/2019 3:43 PM

## 2019-02-10 NOTE — Progress Notes (Signed)
Patient's blood pressure this evening was low therefore BP medication was held and Gatorade given along with fluid encouragement.
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
## 2019-02-10 NOTE — BHH Group Notes (Signed)
BHH Group Notes:  (Nursing/MHT/Case Management/Adjunct)  Date:  02/10/2019  Time:  11:08 PM  Type of Therapy:  Group Therapy  Participation Level:  Active  Participation Quality:  Appropriate and Pt. came group late.  Affect:  Appropriate  Cognitive:  Alert  Insight:  Good  Engagement in Group:  Engaged  Modes of Intervention:  Support  Summary of Progress/Problems:  Jason Patterson 02/10/2019, 11:08 PM

## 2019-02-10 NOTE — BHH Suicide Risk Assessment (Signed)
[facility] Admission Suicide Risk Assessment   Nursing information obtained from:    Demographic factors:  Caucasian, Male Current Mental Status:  Suicidal ideation indicated by patient Loss Factors:  Decline in physical health Historical Factors:  Prior suicide attempts Risk Reduction Factors:  Positive social support  Total Time spent with patient: 1 hour Principal Problem: MDD (major depressive disorder), recurrent severe, without psychosis (HCC) Diagnosis:  Principal Problem:   MDD (major depressive disorder), recurrent severe, without psychosis (HCC) Active Problems:   Schizoaffective disorder, unspecified type (HCC)   Essential hypertension   COPD (chronic obstructive pulmonary disease) (HCC)   Convulsions (HCC)  Subjective Data: Patient interviewed chart reviewed.  This is a patient with a history of major depression or schizoaffective disorder who was admitted because he was asking people to kill him and his day program.  Patient continues to say that he would rather die than go back to his group home.  He has no intention of killing himself in the hospital and this all seems to revolve around his group home.  Denies any current hallucinations.  Not showing signs of delusions.  No aggression or violence.  Continued Clinical Symptoms:  Alcohol Use Disorder Identification Test Final Score (AUDIT): 0 The "Alcohol Use Disorders Identification Test", Guidelines for Use in Primary Care, Second Edition.  World Science writer [facility]). Score between 0-7:  no or low risk or alcohol related problems. Score between 8-15:  moderate risk of alcohol related problems. Score between 16-19:  high risk of alcohol related problems. Score 20 or above:  warrants further diagnostic evaluation for alcohol dependence and treatment.   CLINICAL FACTORS:   Depression:   Anhedonia Schizophrenia:   Depressive state   Musculoskeletal: Strength & Muscle Tone: within normal limits Gait & Station:  normal Patient leans: N/A  Psychiatric Specialty Exam: Physical Exam  Nursing note and vitals reviewed. Constitutional: He appears well-developed and well-nourished.  HENT:  Head: Normocephalic and atraumatic.  Eyes: Pupils are equal, round, and reactive to light. Conjunctivae are normal.  Neck: Normal range of motion.  Cardiovascular: Regular rhythm and normal heart sounds.  Respiratory: Effort normal.  GI: Soft.  Musculoskeletal: Normal range of motion.  Neurological: He is alert.  Skin: Skin is warm and dry.  Psychiatric: He has a normal mood and affect. His speech is normal and behavior is normal. Cognition and memory are normal. He expresses impulsivity and inappropriate judgment. He expresses suicidal ideation. He expresses no suicidal plans.    Review of Systems  Constitutional: Negative.   HENT: Negative.   Eyes: Negative.   Respiratory: Negative.   Cardiovascular: Negative.   Gastrointestinal: Negative.   Musculoskeletal: Negative.   Skin: Negative.   Neurological: Negative.   Psychiatric/Behavioral: Positive for depression and suicidal ideas. Negative for hallucinations, memory loss and substance abuse. The patient is not nervous/anxious and does not have insomnia.     Blood pressure 110/82, pulse 71, temperature 98 F (36.7 C), temperature source Oral, resp. rate 18, height 5\' 7"  (1.702 m), weight 74.4 kg, SpO2 99 %.Body mass index is 25.69 kg/m.  General Appearance: Fairly Groomed  Eye Contact:  Fair  Speech:  Slow  Volume:  Decreased  Mood:  Depressed  Affect:  Appropriate  Thought Process:  Goal Directed  Orientation:  Full (Time, Place, and Person)  Thought Content:  Illogical  Suicidal Thoughts:  Yes.  without intent/plan  Homicidal Thoughts:  No  Memory:  Immediate;   Fair Recent;   Fair Remote;   Fair  Judgement:  Impaired  Insight:  Lacking  Psychomotor Activity:  Normal  Concentration:  Concentration: Fair  Recall:  [employer] of Knowledge:   Fair  Language:  Fair  Akathisia:  No  Handed:  Right  AIMS (if indicated):     Assets:  Desire for Improvement Housing Physical Health Resilience Social Support  ADL's:  Intact  Cognition:  Impaired,  Mild  Sleep:         COGNITIVE FEATURES THAT CONTRIBUTE TO RISK:  Closed-mindedness    SUICIDE RISK:   Minimal: No identifiable suicidal ideation.  Patients presenting with no risk factors but with morbid ruminations; may be classified as minimal risk based on the severity of the depressive symptoms  PLAN OF CARE: Patient seen chart reviewed.  Patient very familiar to me from previous encounters.  Talks about being more willing to die than go back to his group home but this is a chronic thing we have heard it many times before.  He has not acted out on actually trying to kill himself.  He is smiling and upbeat and looks like he is pretty lucid and together.  Patient is otherwise calm.  No specific new physical problems.  Compliant with treatment.  Patient will be monitored for behavior and we will work with him on discharge planning.  I certify that inpatient services furnished can reasonably be expected to improve the patient's condition.   [name], MD 02/10/2019, 5:18 PM

## 2019-02-10 NOTE — Tx Team (Signed)
Initial Treatment Plan 02/10/2019 11:41 AM Jason Patterson [id number]    PATIENT STRESSORS: Health problems Loss of independance    PATIENT STRENGTHS: Ability for insight Communication skills Supportive family/friends   PATIENT IDENTIFIED PROBLEMS: depression   Anxiety                    DISCHARGE CRITERIA:  Ability to meet basic life and health needs Motivation to continue treatment in a less acute level of care  PRELIMINARY DISCHARGE PLAN: Return to previous living arrangement  PATIENT/FAMILY INVOLVEMENT: This treatment plan has been presented to and reviewed with the patient, Jason Patterson.  The patient has been given the opportunity to ask questions and make suggestions.  Chalmers Cater, RN 02/10/2019, 11:41 AM

## 2019-02-10 NOTE — Progress Notes (Signed)
Pt in bed at shift change.  Pt sys since he has been here he has had not thoughts of SI or HI and denies AVH.  Pt denies pain or discomfort.  Pt sts his meds make him sleepy and he wants meds early so he can go to sleep.    Pt is up and in dayroom for snack time, receives medications and returns to bed.  Pt remains safe on unit

## 2019-02-10 NOTE — Plan of Care (Signed)
Pt was educated and oriented to the unit. Pt verbalizes and understanding of expectations. Pt is calm, cooperative and non violent to himself and others. Pt denies SI, HI, depression and anxiety. Will continue to monitor. Torrie Mayers RN Problem: Education: Goal: Knowledge of  General Education information/materials will improve Outcome: Progressing

## 2019-02-11 ENCOUNTER — Other Ambulatory Visit: Payer: Self-pay | Admitting: Psychiatry
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
## 2019-02-11 NOTE — Plan of Care (Signed)
Patient states "I think I am good."Denies SI,HI and AVH.Patient in & out of bed today.Patient stated that his appetite is not good.Compliant with medications.Energy level good.Support and encouragement given.

## 2019-02-11 NOTE — Progress Notes (Signed)
Recreation Therapy Notes  INPATIENT RECREATION THERAPY ASSESSMENT  Patient Details Name: Jason Patterson MRN: [id number] DOB: 15-May-1956 Today's Date: 02/11/2019       Information Obtained From: Patient  Able to Participate in Assessment/Interview: Yes  Patient Presentation: Responsive  Reason for Admission (Per Patient): Active Symptoms, Suicidal Ideation  Patient Stressors:    Coping Skills:   (None)  Leisure Interests (2+):  (Nothing)  Frequency of Recreation/Participation:    Awareness of Community Resources:     [employer]:     Current Use:    If no, Barriers?:    Expressed Interest in [employer] Information:    [place] of Residence:  [place]  Patient Main Form of Transportation: Other (Comment)(Group home)  Patient Strengths:  Nothing  Patient Identified Areas of Improvement:  Nothing  Patient Goal for Hospitalization:  Get moved from that group home  Current SI (including self-harm):  No  Current HI:  No  Current AVH: No  Staff Intervention Plan: Group Attendance, Collaborate with Interdisciplinary Treatment Team  Consent to Intern Participation: N/A  Jason Patterson 02/11/2019, 11:58 AM
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
## 2019-02-11 NOTE — Progress Notes (Signed)
CSW spoke with [name], pts guardian to obtain fax number to send ROI and consent forms. [name] provided the following fax number [phone]. [name] reported that pt will be returning to his current [facility] and that pt does this all the time and this is his norm. Per [name] she will sign ROI for [facility] [place] and consent for who she believes CSW should talk to for collateral information. CSW faxed the forms over.   Iris Pert, MSW, LCSW Clinical Social Work 02/11/2019 10:18 AM

## 2019-02-11 NOTE — [facility] Group Notes (Signed)
LCSW Group Therapy Note  02/11/2019 1:00 PM  Type of Therapy/Topic:  Group Therapy:  Balance in Life  Participation Level:  Minimal  Description of Group:    This group will address the concept of balance and how it feels and looks when one is unbalanced. Patients will be encouraged to process areas in their lives that are out of balance and identify reasons for remaining unbalanced. Facilitators will guide patients in utilizing problem-solving interventions to address and correct the stressor making their life unbalanced. Understanding and applying boundaries will be explored and addressed for obtaining and maintaining a balanced life. Patients will be encouraged to explore ways to assertively make their unbalanced needs known to significant others in their lives, using other group members and facilitator for support and feedback.  Therapeutic Goals: 1. Patient will identify two or more emotions or situations they have that consume much of in their lives. 2. Patient will identify signs/triggers that life has become out of balance:  3. Patient will identify two ways to set boundaries in order to achieve balance in their lives:  4. Patient will demonstrate ability to communicate their needs through discussion and/or role plays  Summary of Patient Progress: Patient was present for group, however, did not engage in discussion.  Patient appeared to be attentive.   Therapeutic Modalities:   Cognitive Behavioral Therapy Solution-Focused Therapy Assertiveness Training  Penni Homans MSW, LCSW 02/11/2019 2:26 PM

## 2019-02-11 NOTE — Progress Notes (Signed)
Seeing patient up and about in the unit, adjusting appropriately , cognitively thoughtful and logical, speech is clear, denies any suicidal, homicidal ideations and denies hallucinations and no signs of delusions noted , comply with medications and no side effects, patient expresses no  Concerns, seeing patient in day room socializing with peers with out any issues, encourage patient to verbalize  To the nurse when need arise. Patient is monitored every 15 minutes for safety and no distress noted.

## 2019-02-11 NOTE — Progress Notes (Signed)
Care Hand-Off  Report given to Gigi, RN. Will endorse care to Gigi, RN at this time.  

## 2019-02-11 NOTE — Progress Notes (Signed)
Recreation Therapy Notes  Date: 02/11/2019  Time: 9:30 am   Location: Craft room   Behavioral response: N/A   Intervention Topic: Relaxation  Discussion/Intervention: Patient did not attend group.   Clinical Observations/Feedback:  Patient did not attend group.   Chike Farrington LRT/CTRS         Litzy Dicker 02/11/2019 10:35 AM

## 2019-02-12 ENCOUNTER — Inpatient Hospital Stay: Payer: Medicaid Other | Admitting: Anesthesiology

## 2019-02-12 ENCOUNTER — Inpatient Hospital Stay (HOSPITAL_COMMUNITY)
Admission: RE | Admit: 2019-02-12 | Discharge: 2019-02-12 | Disposition: A | Discharge status: Home / Self Care | Payer: Medicaid Other | Source: Ambulatory Visit | Attending: Psychiatry | Admitting: Psychiatry

## 2019-02-12 DIAGNOSIS — F332 Major depressive disorder, recurrent severe without psychotic features: Secondary | ICD-10-CM

## 2019-02-12 LAB — GLUCOSE, CAPILLARY: Glucose-Capillary: 92 mg/dL (ref 70–99)

## 2019-02-12 MED ORDER — SODIUM CHLORIDE 0.9 % IV SOLN
INTRAVENOUS | Status: DC | PRN
  Administered 2019-02-12: 10:00:00 via INTRAVENOUS

## 2019-02-12 MED ORDER — GLYCOPYRROLATE 0.2 MG/ML IJ SOLN
INTRAMUSCULAR | Status: AC
  Filled 2019-02-12: qty 1

## 2019-02-12 MED ORDER — ONDANSETRON HCL 4 MG/2ML IJ SOLN: 4.0000 mg | Freq: Once | INTRAMUSCULAR | Status: DC | PRN

## 2019-02-12 MED ORDER — SODIUM CHLORIDE 0.9 % IV SOLN
500.0000 mL | Freq: Once | INTRAVENOUS | Status: AC
  Administered 2019-02-12: 500 mL via INTRAVENOUS

## 2019-02-12 MED ORDER — FENTANYL CITRATE (PF) 100 MCG/2ML IJ SOLN: 25.0000 ug | INTRAMUSCULAR | Status: DC | PRN

## 2019-02-12 MED ORDER — SUCCINYLCHOLINE CHLORIDE 20 MG/ML IJ SOLN
INTRAMUSCULAR | Status: DC | PRN
  Administered 2019-02-12: 90 mg via INTRAVENOUS

## 2019-02-12 MED ORDER — GLYCOPYRROLATE 0.2 MG/ML IJ SOLN
0.1000 mg | Freq: Once | INTRAMUSCULAR | Status: AC
  Administered 2019-02-12: 0.1 mg via INTRAVENOUS

## 2019-02-12 MED ORDER — METHOHEXITAL SODIUM 100 MG/10ML IV SOSY
PREFILLED_SYRINGE | INTRAVENOUS | Status: DC | PRN
  Administered 2019-02-12: 70 mg via INTRAVENOUS

## 2019-02-12 NOTE — Transfer of Care (Signed)
Immediate Anesthesia Transfer of Care Note  Patient: Jason Patterson  Procedure(s) Performed: ECT TX  Patient Location: PACU  Anesthesia Type:General  Level of Consciousness: sedated  Airway & Oxygen Therapy: Patient Spontanous Breathing and Patient connected to face mask oxygen  Post-op Assessment: Report given to RN and Post -op Vital signs reviewed and stable  Post vital signs: Reviewed and stable  Last Vitals:  Vitals Value Taken Time  BP 143/86 02/12/2019 10:26 AM  Temp 36.5 C 02/12/2019 10:26 AM  Pulse 54 02/12/2019 10:26 AM  Resp 26 02/12/2019 10:26 AM  SpO2 100 % 02/12/2019 10:26 AM  Vitals shown include unvalidated device data.  Last Pain:  Vitals:   02/12/19 1026  TempSrc: Temporal  PainSc: 0-No pain         Complications: No apparent anesthesia complications
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
## 2019-02-12 NOTE — Plan of Care (Signed)
Patient was so anxious about discharge before ECT states "I am so scared to go back to that group home.Please tell Dr.Clapacs not to discharge me today."After ECT patient looks relaxed states "I think I am next week."Attended groups.Denies SI,HI and AVH.Appropriate with staff & peers.Support and encouragement given.

## 2019-02-12 NOTE — Anesthesia Procedure Notes (Signed)
Performed by: Jonpaul Lumm, CRNA Pre-anesthesia Checklist: Patient identified, Emergency Drugs available, Suction available and Patient being monitored Patient Re-evaluated:Patient Re-evaluated prior to induction Oxygen Delivery Method: Circle system utilized Preoxygenation: Pre-oxygenation with 100% oxygen Induction Type: IV induction Ventilation: Mask ventilation without difficulty and Mask ventilation throughout procedure Airway Equipment and Method: Bite block Placement Confirmation: positive ETCO2 Dental Injury: Teeth and Oropharynx as per pre-operative assessment        

## 2019-02-12 NOTE — Anesthesia Postprocedure Evaluation (Signed)
Anesthesia Post Note  Patient: Jason Patterson  Procedure(s) Performed: ECT TX  Patient location during evaluation: PACU Anesthesia Type: General Level of consciousness: awake and alert Pain management: pain level controlled Vital Signs Assessment: post-procedure vital signs reviewed and stable Respiratory status: spontaneous breathing and respiratory function stable Cardiovascular status: stable Anesthetic complications: no     Last Vitals:  Vitals:   02/12/19 1055 02/12/19 1057  BP: 125/82 138/75  Pulse: 63 61  Resp: 15 (!) 23  Temp: (!) 36.4 C   SpO2: 96% 95%    Last Pain:  Vitals:   02/12/19 1055  TempSrc:   PainSc: 0-No pain                 [name]

## 2019-02-12 NOTE — Progress Notes (Signed)
CSW spoke with Arvella Nigh, a member of pts ACTT team regarding pt possibly discharging Monday due to being upset and crying this morning regarding his current placement. CSW inquired if ACTT team will provide transportation, Arvella Nigh reported she was unsure due to their limitations because of COVID-19. She reported she would call her supervisor and give CSW a call back regarding if they will be able to provide pt with transportation at discharge.   Iris Pert, MSW, LCSW Clinical Social Work 02/12/2019 9:27 AM
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
## 2019-02-12 NOTE — Anesthesia Preprocedure Evaluation (Signed)
Anesthesia Evaluation  Patient identified by MRN, date of birth, ID band Patient awake    Reviewed: Allergy & Precautions, NPO status , Patient's Chart, lab work & pertinent test results  History of Anesthesia Complications Negative for: history of anesthetic complications  Airway Mallampati: III  TM Distance: >3 FB Neck ROM: Full    Dental  (+) Poor Dentition   Pulmonary COPD,  COPD inhaler, Current Smoker,    breath sounds clear to auscultation- rhonchi (-) wheezing      Cardiovascular hypertension, Pt. on medications (-) CAD, (-) Past MI, (-) Cardiac Stents and (-) CABG (-) dysrhythmias (RBBB)  Rhythm:Regular Rate:Normal - Systolic murmurs and - Diastolic murmurs    Neuro/Psych  Headaches, Seizures -, Well Controlled,  PSYCHIATRIC DISORDERS Anxiety Depression Bipolar Disorder Schizophrenia    GI/Hepatic Neg liver ROS, GERD  ,  Endo/Other  negative endocrine ROSneg diabetes  Renal/GU negative Renal ROS     Musculoskeletal negative musculoskeletal ROS (+)   Abdominal (+) - obese,   Peds  Hematology negative hematology ROS (+)   Anesthesia Other Findings Past Medical History: No date: Anxiety No date: Bipolar disorder, unspecified (HCC) No date: Coarse tremors No date: COPD (chronic obstructive pulmonary disease) (HCC) No date: Depression No date: Diastolic dysfunction No date: Dysrhythmia No date: Essential hypertension, benign No date: Generalized anxiety disorder No date: GERD (gastroesophageal reflux disease) No date: Headache(784.0) No date: Physical deconditioning No date: Psoriasis No date: Rhabdomyolysis No date: Schizoaffective disorder, unspecified condition No date: Seizure disorder (HCC) No date: Seizures (HCC)     Comment:  NONE SINCE AGE 63 No date: Sepsis (HCC) No date: Shortness of breath     Comment:  W/ EXERTION    Reproductive/Obstetrics                              Anesthesia Physical  Anesthesia Plan  ASA: III  Anesthesia Plan: General   Post-op Pain Management:    Induction: Intravenous  PONV Risk Score and Plan: 0  Airway Management Planned: Mask  Additional Equipment:   Intra-op Plan:   Post-operative Plan:   Informed Consent: I have reviewed the patients History and Physical, chart, labs and discussed the procedure including the risks, benefits and alternatives for the proposed anesthesia with the patient or authorized representative who has indicated his/her understanding and acceptance.     Dental advisory given  Plan Discussed with: CRNA and Anesthesiologist  Anesthesia Plan Comments:         Anesthesia Quick Evaluation  

## 2019-02-12 NOTE — Anesthesia Post-op Follow-up Note (Signed)
Anesthesia QCDR form completed.        

## 2019-02-12 NOTE — Procedures (Signed)
ECT SERVICES Physician's Interval Evaluation & Treatment Note  Patient Identification: Jason Patterson MRN:  [id number] Date of Evaluation:  02/12/2019 TX #: [id number]  MADRS:   MMSE:   P.E. Findings:  No real change to physical exam  Psychiatric Interval Note:  Tearful sad and saying that he wants to die.  Subjective:  Patient is a 63 y.o. male seen for evaluation for Electroconvulsive Therapy. Reports that he is feeling much more sad down and tearful.  Treatment Summary:   [x]   Right Unilateral             []  Bilateral   % Energy : 0.3 ms 100%   Impedance: 1320 ohms  Seizure Energy Index: 2873 V squared  Postictal Suppression Index: 90%  Seizure Concordance Index: 85%  Medications  Pre Shock: Robinul 0.1 mg Toradol 30 mg Brevital 70 mg succinylcholine 100 mg  Post Shock:    Seizure Duration: 17 seconds EMG 40 seconds EEG   Comments: Follow-up on Monday  Lungs:  [x]   Clear to auscultation               []  Other:   Heart:    [x]   Regular rhythm             []  irregular rhythm    [x]   Previous H&P reviewed, patient examined and there are NO CHANGES                 []   Previous H&P reviewed, patient examined and there are changes noted.   [name], MD 3/27/202010:13 AM

## 2019-02-12 NOTE — Progress Notes (Signed)
CSW spoke with Misty Stanley from pts [facility] to inquire whether there were any concerns with pt returning to the group home once he is discharged from the hospital. Misty Stanley reported she would have to talk with her boss and would give CSW a call back.  Iris Pert, MSW, LCSW Clinical Social Work 02/12/2019 9:37 AM

## 2019-02-12 NOTE — [facility] Group Notes (Signed)
Feelings Around Relapse 02/12/2019 1PM  Type of Therapy and Topic:  Group Therapy:  Feelings around Relapse and Recovery  Participation Level:  Active   Description of Group:    Patients in this group will discuss emotions they experience before and after a relapse. They will process how experiencing these feelings, or avoidance of experiencing them, relates to having a relapse. Facilitator will guide patients to explore emotions they have related to recovery. Patients will be encouraged to process which emotions are more powerful. They will be guided to discuss the emotional reaction significant others in their lives may have to patients' relapse or recovery. Patients will be assisted in exploring ways to respond to the emotions of others without this contributing to a relapse.  Therapeutic Goals: 1. Patient will identify two or more emotions that lead to a relapse for them 2. Patient will identify two emotions that result when they relapse 3. Patient will identify two emotions related to recovery 4. Patient will demonstrate ability to communicate their needs through discussion and/or role plays   Summary of Patient Progress:  Actively and appropriately engaged in the group. Patient was able to provide support and validation to other group members.Patient practiced active listening when interacting with the facilitator and other group members.    Therapeutic Modalities:   Cognitive Behavioral Therapy Solution-Focused Therapy Assertiveness Training Relapse Prevention Therapy   Suzan Slick, LCSW 02/12/2019 2:17 PM

## 2019-02-13 NOTE — Plan of Care (Signed)
Patient is alert and oriented X4, denies SI, HI and AVH today. Patient is very cooperative and pleasant to staff and peers. Patient worries about discharge and going back to family care home. Patient is logical and coherent in speech and thought. No self harming behaviors noted/ observed thus far. 15 minute safety checks will continue. Problem: Education: Goal: Knowledge of [facility] Education information/materials will improve Outcome: Progressing   Problem: Coping: Goal: Coping ability will improve Outcome: Progressing Goal: Will verbalize feelings Outcome: Progressing   Problem: Safety: Goal: Ability to disclose and discuss suicidal ideas will improve Outcome: Progressing   Problem: Self-Concept: Goal: Ability to identify factors that promote anxiety will improve Outcome: Progressing Goal: Level of anxiety will decrease Outcome: Progressing Goal: Ability to modify response to factors that promote anxiety will improve Outcome: Progressing

## 2019-02-13 NOTE — Progress Notes (Addendum)
Patient alert and oriented x 4, no distress noted, affect is flat and sad, isolates to self, his thoughts are organized and coherent, receptive to staff and compliant with medication regimen. 15 minutes safety checks maintained will continue to monitor

## 2019-02-13 NOTE — [facility] Group Notes (Signed)
LCSW Group Therapy Note  02/13/2019 1:15pm  Type of Therapy and Topic:  Group Therapy:  Cognitive Distortions  Participation Level:  Active   Description of Group:    Patients in this group will be introduced to the topic of cognitive distortions.  Patients will identify and describe cognitive distortions, describe the feelings these distortions create for them.  Patients will identify one or more situations in their personal life where they have cognitively distorted thinking and will verbalize challenging this cognitive distortion through positive thinking skills.  Patients will practice the skill of using positive affirmations to challenge cognitive distortions using affirmation cards.    Therapeutic Goals:  1. Patient will identify two or more cognitive distortions they have used 2. Patient will identify one or more emotions that stem from use of a cognitive distortion 3. Patient will demonstrate use of a positive affirmation to counter a cognitive distortion through discussion and/or role play. 4. Patient will describe one way cognitive distortions can be detrimental to wellness   Summary of Patient Progress: The patient reported that he  feels "okay." Patients were introduced to the topic of cognitive distortions. The patient was able to identify and describe cognitive distortions, described the feelings these distortions create for him. Patient identified a situation in his  personal life where he has cognitively distorted thinking and was able to verbalize and challenged this cognitive distortion through positive thinking skills. Patient was able to provide support and validation to other group members.     Therapeutic Modalities:   Cognitive Behavioral Therapy Motivational Interviewing   Torrance Frech  CUEBAS-COLON, LCSW 02/13/2019 10:26 AM

## 2019-02-13 NOTE — Plan of Care (Signed)
  Problem: Coping: Goal: Coping ability will improve Outcome: Progressing  Patient is coping effectively   
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
## 2019-02-14 NOTE — Plan of Care (Signed)
D- Patient alert and oriented. Patient presents in a pleasant mood on assessment stating that he didn't sleep good last night and had complaints of back pain, rating it a "6/10". Patient stated to this writer that he has "degenerative disc disease" and he did request pain medication from this Clinical research associate. Patient rated his depression and anxiety a "7/10" stating that "being locked up in here, I guess" and "ready to get out of here" is why he's feeling this way. Patient denies SI, HI, AVH, at this time. Patient had no stated goals for today "I don't have one".  A- Scheduled medications administered to patient, per MD orders. Support and encouragement provided.  Routine safety checks conducted every 15 minutes.  Patient informed to notify staff with problems or concerns.  R- No adverse drug reactions noted. Patient contracts for safety at this time. Patient compliant with medications and treatment plan. Patient receptive, calm, and cooperative. Patient interacts well with others on the unit.  Patient remains safe at this time.   Problem: Education: Goal: Knowledge of [facility] Education information/materials will improve Outcome: Progressing   Problem: Coping: Goal: Coping ability will improve Outcome: Progressing Goal: Will verbalize feelings Outcome: Progressing   Problem: Safety: Goal: Ability to disclose and discuss suicidal ideas will improve Outcome: Progressing   Problem: Self-Concept: Goal: Ability to identify factors that promote anxiety will improve Outcome: Progressing Goal: Level of anxiety will decrease Outcome: Progressing Goal: Ability to modify response to factors that promote anxiety will improve Outcome: Progressing
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
## 2019-02-14 NOTE — [facility] Group Notes (Signed)
[facility] Group Notes:  (Nursing/MHT/Case Management/Adjunct)  Date:  02/14/2019  Time:  12:10 AM  Type of Therapy:  Group Therapy  Participation Level:  Did Not Attend   Mayra Neer 02/14/2019, 12:10 AM

## 2019-02-14 NOTE — [facility] Group Notes (Signed)
LCSW Group Therapy Note 02/14/2019 1:15pm  Type of Therapy and Topic: Group Therapy: Feelings Around Returning Home & Establishing a Supportive Framework and Supporting Oneself When Supports Not Available  Participation Level: Did Not Attend  Description of Group:  Patients first processed thoughts and feelings about upcoming discharge. These included fears of upcoming changes, lack of change, new living environments, judgements and expectations from others and overall stigma of mental health issues. The group then discussed the definition of a supportive framework, what that looks and feels like, and how do to discern it from an unhealthy non-supportive network. The group identified different types of supports as well as what to do when your family/friends are less than helpful or unavailable  Therapeutic Goals  1. Patient will identify one healthy supportive network that they can use at discharge. 2. Patient will identify one factor of a supportive framework and how to tell it from an unhealthy network. 3. Patient able to identify one coping skill to use when they do not have positive supports from others. 4. Patient will demonstrate ability to communicate their needs through discussion and/or role plays.  Summary of Patient Progress:  Pt was invited to attend group but chose not to attend. CSW will continue to encourage pt to attend group throughout their admission.   Therapeutic Modalities Cognitive Behavioral Therapy Motivational Interviewing   Jason Patterson  CUEBAS-COLON, LCSW 02/14/2019 9:04 AM  

## 2019-02-14 NOTE — Progress Notes (Signed)
Patient is alert and responsive , mood is pleasant and calm, affect is appropriate to circumstances, patient is maintaining safety guide lines and minimally socializing, patient is in his room most of the time only out for meals, patient states that he is doing OK with his ECT treatment with out any side effect voiced. Patient contract for safety of self  and others , patient denies any suicidal , homicidal ideations and no delusions, hallucinations noted, education is provided and encouraged to try participating in group scheduled activities , patient is monitored every 15 minutes for safety no distress.

## 2019-02-15 ENCOUNTER — Other Ambulatory Visit: Payer: Self-pay | Admitting: Psychiatry

## 2019-02-15 ENCOUNTER — Inpatient Hospital Stay: Payer: Medicaid Other

## 2019-02-15 ENCOUNTER — Inpatient Hospital Stay: Payer: Medicaid Other | Admitting: Anesthesiology

## 2019-02-15 LAB — GLUCOSE, CAPILLARY: Glucose-Capillary: 88 mg/dL (ref 70–99)

## 2019-02-15 MED ORDER — FAMOTIDINE 10 MG PO TABS: 10.0000 mg | ORAL_TABLET | Freq: Two times a day (BID) | ORAL | 1 refills | Status: AC

## 2019-02-15 MED ORDER — KETOROLAC TROMETHAMINE 30 MG/ML IJ SOLN: 30.0000 mg | Freq: Once | INTRAMUSCULAR | Status: DC

## 2019-02-15 MED ORDER — OLANZAPINE 15 MG PO TABS: 15.0000 mg | ORAL_TABLET | Freq: Every day | ORAL | 1 refills | Status: DC

## 2019-02-15 MED ORDER — ADULT MULTIVITAMIN W/MINERALS CH: 1.0000 | ORAL_TABLET | Freq: Every day | ORAL | 1 refills | Status: DC

## 2019-02-15 MED ORDER — GLYCOPYRROLATE 0.2 MG/ML IJ SOLN: 0.1000 mg | Freq: Once | INTRAMUSCULAR | Status: DC

## 2019-02-15 MED ORDER — VITAMIN D3 25 MCG (1000 UNIT) PO TABS: 2000.0000 [IU] | ORAL_TABLET | Freq: Every day | ORAL | 1 refills | Status: AC

## 2019-02-15 MED ORDER — LEVETIRACETAM 500 MG PO TABS: 1000.0000 mg | ORAL_TABLET | Freq: Two times a day (BID) | ORAL | 1 refills | Status: AC

## 2019-02-15 MED ORDER — LOXAPINE SUCCINATE 25 MG PO CAPS: 25.0000 mg | ORAL_CAPSULE | Freq: Three times a day (TID) | ORAL | 1 refills | Status: AC

## 2019-02-15 MED ORDER — SUCCINYLCHOLINE CHLORIDE 20 MG/ML IJ SOLN
INTRAMUSCULAR | Status: AC
  Filled 2019-02-15: qty 1

## 2019-02-15 MED ORDER — ASPIRIN EC 81 MG PO TBEC: 81.0000 mg | DELAYED_RELEASE_TABLET | Freq: Every day | ORAL | 1 refills | Status: AC

## 2019-02-15 MED ORDER — METOPROLOL TARTRATE 25 MG PO TABS: 12.5000 mg | ORAL_TABLET | Freq: Two times a day (BID) | ORAL | 1 refills | Status: AC

## 2019-02-15 MED ORDER — ALBUTEROL SULFATE HFA 108 (90 BASE) MCG/ACT IN AERS: 1.0000 | INHALATION_SPRAY | Freq: Four times a day (QID) | RESPIRATORY_TRACT | 1 refills | Status: AC | PRN

## 2019-02-15 MED ORDER — SODIUM CHLORIDE 0.9 % IV SOLN: 500.0000 mL | Freq: Once | INTRAVENOUS | Status: DC

## 2019-02-15 MED ORDER — LATANOPROST 0.005 % OP SOLN: 1.0000 [drp] | Freq: Every day | OPHTHALMIC | 12 refills | Status: AC

## 2019-02-15 MED ORDER — SERTRALINE HCL 100 MG PO TABS: 100.0000 mg | ORAL_TABLET | Freq: Every day | ORAL | 1 refills | Status: DC

## 2019-02-15 NOTE — Anesthesia Post-op Follow-up Note (Signed)
Anesthesia QCDR form completed.        

## 2019-02-15 NOTE — Progress Notes (Signed)
D - Patient was in his room upon arrival to the unit. Patient was pleasant during assessment and medication administration. Patient denies SI/HI/AVH and pain. Patient stated he has a little bit of anxiety and depression from being in here but he said he feels better than he has the last few days.   A - Patient compliant with medication administration per MD orders and procedures on the unit. Patient given education. Patient given support and encouragement to be active in his treatment plan. Patient informed to let staff know if there are any issues or problems on the unit.   R - Patient being monitored Q 15 minutes for safety per unit protocol. Patient remains safe on the unit.

## 2019-02-15 NOTE — Progress Notes (Signed)
  [facility] Adult Case Management Discharge Plan :  Will you be returning to the same living situation after discharge:  Yes,  [facility] At discharge, do you have transportation home?: Yes,  [facility] will pick pt up after 12 pm Do you have the ability to pay for your medications: Yes,  [employer] [employer]  Release of information consent forms completed and in the chart;   Patient to Follow up at: Follow-up Information    [facility] Follow up.   Why:  Continue to follow up with your ACTT team as usual. Thank You! Contact information: [address] [place] [address] [phone]        [facility] Follow up on 02/22/2019.   Why:  You have ECT scheduled for Monday 02/22/2019 at 9AM, with Dr. Toni Amend. Thank you! Contact information: [address] [id number] [place] [address] [phone]          Next level of care provider has access to [facility] Link:no  Safety Planning and Suicide Prevention discussed: Yes,  SPE completed with pt and pts legal guardian  Have you used any form of tobacco in the last 30 days? (Cigarettes, Smokeless Tobacco, Cigars, and/or Pipes): Yes  Has patient been referred to the Quitline?: Patient refused referral  Patient has been referred for addiction treatment: N/A  Mechele Dawley, LCSW 02/15/2019, 10:53 AM

## 2019-02-15 NOTE — Anesthesia Postprocedure Evaluation (Signed)
Anesthesia Post Note  Patient: Jason Patterson  Procedure(s) Performed: ECT TX  Patient location during evaluation: PACU Anesthesia Type: General Level of consciousness: awake and alert Pain management: pain level controlled Vital Signs Assessment: post-procedure vital signs reviewed and stable Respiratory status: spontaneous breathing, nonlabored ventilation and respiratory function stable Cardiovascular status: blood pressure returned to baseline and stable Postop Assessment: no apparent nausea or vomiting Anesthetic complications: no     Last Vitals:  Vitals:   02/15/19 1120 02/15/19 1130  BP: 131/86 129/77  Pulse: 64 61  Resp: 18 20  Temp:  36.6 C  SpO2: 96% 99%    Last Pain:  Vitals:   02/15/19 1130  TempSrc:   PainSc: 0-No pain                 Jovita Gamma

## 2019-02-15 NOTE — Tx Team (Signed)
Interdisciplinary Treatment and Diagnostic Plan Update  02/15/2019 Time of Session: 830AM Jason Patterson MRN: [id number]  Principal Diagnosis: MDD (major depressive disorder), recurrent severe, without psychosis (HCC)  Secondary Diagnoses: Principal Problem:   MDD (major depressive disorder), recurrent severe, without psychosis (HCC) Active Problems:   Schizoaffective disorder, unspecified type (HCC)   Essential hypertension   COPD (chronic obstructive pulmonary disease) (HCC)   Convulsions (HCC)   Current Medications:  Current Facility-Administered Medications  Medication Dose Route Frequency Provider Last Rate Last Dose  . 0.9 %  sodium chloride infusion  500 mL Intravenous Once Clapacs, John T, MD      . acetaminophen (TYLENOL) tablet 650 mg  650 mg Oral Q6H PRN Mariel Craft, MD   650 mg at 02/14/19 0851  . albuterol (PROVENTIL HFA;VENTOLIN HFA) 108 (90 Base) MCG/ACT inhaler 1 puff  1 puff Inhalation Q6H PRN Mariel Craft, MD      . alum & mag hydroxide-simeth (MAALOX/MYLANTA) 200-200-20 MG/5ML suspension 30 mL  30 mL Oral Q4H PRN Mariel Craft, MD      . aspirin EC tablet 81 mg  81 mg Oral Daily Mariel Craft, MD   81 mg at 02/14/19 0857  . B-complex with vitamin C tablet 1 tablet  1 tablet Oral Daily Mariel Craft, MD   1 tablet at 02/14/19 [phone]  . cholecalciferol (VITAMIN D) tablet 2,000 Units  2,000 Units Oral Daily Mariel Craft, MD   2,000 Units at 02/14/19 [phone]  . famotidine (PEPCID) tablet 10 mg  10 mg Oral BID Mariel Craft, MD   10 mg at 02/14/19 1639  . glycopyrrolate (ROBINUL) injection 0.1 mg  0.1 mg Intravenous Once Clapacs, John T, MD      . ketorolac (TORADOL) 30 MG/ML injection 30 mg  30 mg Intravenous Once Clapacs, John T, MD      . latanoprost (XALATAN) 0.005 % ophthalmic solution 1 drop  1 drop Both Eyes QHS Mariel Craft, MD   1 drop at 02/13/19 2158  . levETIRAcetam (KEPPRA) tablet 1,000 mg  1,000 mg Oral BID Mariel Craft, MD    1,000 mg at 02/14/19 1639  . loxapine (LOXITANE) capsule 25 mg  25 mg Oral TID Mariel Craft, MD   25 mg at 02/14/19 1639  . magnesium hydroxide (MILK OF MAGNESIA) suspension 30 mL  30 mL Oral Daily PRN Mariel Craft, MD      . metoprolol tartrate (LOPRESSOR) tablet 12.5 mg  12.5 mg Oral BID Mariel Craft, MD   12.5 mg at 02/15/19 0849  . multivitamin with minerals tablet 1 tablet  1 tablet Oral Daily Mariel Craft, MD   1 tablet at 02/14/19 [phone]  . OLANZapine (ZYPREXA) tablet 15 mg  15 mg Oral QHS Mariel Craft, MD   15 mg at 02/14/19 2108  . sertraline (ZOLOFT) tablet 100 mg  100 mg Oral Daily Mariel Craft, MD   100 mg at 02/14/19 0857  . tamsulosin (FLOMAX) capsule 0.4 mg  0.4 mg Oral Daily Mariel Craft, MD   0.4 mg at 02/14/19 0488   PTA Medications: Medications Prior to Admission  Medication Sig Dispense Refill Last Dose  . albuterol (PROAIR HFA) 108 (90 Base) MCG/ACT inhaler Inhale 1 puff into the lungs every 6 (six) hours as needed for wheezing or shortness of breath. 1 Inhaler 1 01/28/2019  . aspirin EC 81 MG tablet Take 1 tablet (81 mg total) by mouth  daily. 30 tablet 1 01/28/2019  . B Complex-C (B-COMPLEX WITH VITAMIN C) tablet TAKE 1 TABLET BY MOUTH EACH DAY. 29 tablet 10 01/28/2019  . cholecalciferol (VITAMIN D) 25 MCG (1000 UT) tablet Take 2 tablets (2,000 Units total) by mouth daily. 60 tablet 1 01/28/2019  . famotidine (PEPCID) 10 MG tablet Take 1 tablet (10 mg total) by mouth 2 (two) times daily. 60 tablet 1 01/28/2019  . latanoprost (XALATAN) 0.005 % ophthalmic solution PLACE 1 DROP INTO BOTH EYES AT BEDTIME *REFRIGERATE BEFORE OPEN* *DATE OPEN__________* *DISCARD 6WKS AFTER OPEN* 2.5 mL 10 01/28/2019  . levETIRAcetam (KEPPRA) 500 MG tablet Take 2 tablets (1,000 mg total) by mouth 2 (two) times daily. 120 tablet 1 01/28/2019  . loxapine (LOXITANE) 25 MG capsule Take 1 capsule (25 mg total) by mouth 3 (three) times daily. 90 capsule 1 01/28/2019  . metoprolol  tartrate (LOPRESSOR) 25 MG tablet Take 0.5 tablets (12.5 mg total) by mouth 2 (two) times daily. 30 tablet 1 01/28/2019  . Multiple Vitamin (MULTIVITAMIN WITH MINERALS) TABS tablet Take 1 tablet by mouth daily. 30 tablet 1 01/28/2019  . OLANZapine (ZYPREXA) 15 MG tablet TAKE ONE TABLET BY MOUTH AT BEDTIME. 29 tablet 10 01/28/2019  . sertraline (ZOLOFT) 100 MG tablet TAKE 1 TABLET BY MOUTH EACH DAY. 29 tablet 10 01/28/2019  . tamsulosin (FLOMAX) 0.4 MG CAPS capsule Take 1 capsule (0.4 mg total) by mouth daily. 30 capsule 1 01/28/2019    Patient Stressors: Health problems Loss of independance   Patient Strengths: Ability for insight Communication skills Supportive family/friends  Treatment Modalities: Medication Management, Group therapy, Case management,  1 to 1 session with clinician, Psychoeducation, Recreational therapy.   Physician Treatment Plan for Primary Diagnosis: MDD (major depressive disorder), recurrent severe, without psychosis (HCC) Long Term Goal(s): Improvement in symptoms so as ready for discharge Improvement in symptoms so as ready for discharge   Short Term Goals: Ability to verbalize feelings will improve Ability to disclose and discuss suicidal ideas Compliance with prescribed medications will improve  Medication Management: Evaluate patient's response, side effects, and tolerance of medication regimen.  Therapeutic Interventions: 1 to 1 sessions, Unit Group sessions and Medication administration.  Evaluation of Outcomes: Adequate for Discharge  Physician Treatment Plan for Secondary Diagnosis: Principal Problem:   MDD (major depressive disorder), recurrent severe, without psychosis (HCC) Active Problems:   Schizoaffective disorder, unspecified type (HCC)   Essential hypertension   COPD (chronic obstructive pulmonary disease) (HCC)   Convulsions (HCC)  Long Term Goal(s): Improvement in symptoms so as ready for discharge Improvement in symptoms so as ready for  discharge   Short Term Goals: Ability to verbalize feelings will improve Ability to disclose and discuss suicidal ideas Compliance with prescribed medications will improve     Medication Management: Evaluate patient's response, side effects, and tolerance of medication regimen.  Therapeutic Interventions: 1 to 1 sessions, Unit Group sessions and Medication administration.  Evaluation of Outcomes: Adequate for Discharge   RN Treatment Plan for Primary Diagnosis: MDD (major depressive disorder), recurrent severe, without psychosis (HCC) Long Term Goal(s): Knowledge of disease and therapeutic regimen to maintain health will improve  Short Term Goals: Ability to participate in decision making will improve, Ability to verbalize feelings will improve, Ability to disclose and discuss suicidal ideas, Ability to identify and develop effective coping behaviors will improve and Compliance with prescribed medications will improve  Medication Management: RN will administer medications as ordered by provider, will assess and evaluate patient's response and provide education to  patient for prescribed medication. RN will report any adverse and/or side effects to prescribing provider.  Therapeutic Interventions: 1 on 1 counseling sessions, Psychoeducation, Medication administration, Evaluate responses to treatment, Monitor vital signs and CBGs as ordered, Perform/monitor CIWA, COWS, AIMS and Fall Risk screenings as ordered, Perform wound care treatments as ordered.  Evaluation of Outcomes: Adequate for Discharge   LCSW Treatment Plan for Primary Diagnosis: MDD (major depressive disorder), recurrent severe, without psychosis (HCC) Long Term Goal(s): Safe transition to appropriate next level of care at discharge, Engage patient in therapeutic group addressing interpersonal concerns.  Short Term Goals: Engage patient in aftercare planning with referrals and resources, Increase ability to appropriately  verbalize feelings, Increase emotional regulation and Increase skills for wellness and recovery  Therapeutic Interventions: Assess for all discharge needs, 1 to 1 time with Social worker, Explore available resources and support systems, Assess for adequacy in community support network, Educate family and significant other(s) on suicide prevention, Complete Psychosocial Assessment, Interpersonal group therapy.  Evaluation of Outcomes: Adequate for Discharge   Progress in Treatment: Attending groups: Yes. Participating in groups: Yes. Taking medication as prescribed: Yes. Toleration medication: Yes. Family/Significant other contact made: Yes, individual(s) contacted:  Pts legal Gur[place] [name] Patient understands diagnosis: Yes. Discussing patient identified problems/goals with staff: Yes. Medical problems stabilized or resolved: Yes. Denies suicidal/homicidal ideation: Yes. Issues/concerns per patient self-inventory: Yes.. Other: NA  New problem(s) identified: No, Describe:  none  New Short Term/Long Term Goal(s): medication management for mood stabilization; development of comprehensive mental wellness/sobriety plan.   Patient Goals:  "Go home"  Discharge Plan or Barriers: SPE pamphlet, Mobile Crisis information, and AA/NA information provided to patient for additional community support and resources. Pt will continue to follow up for outpatient ECT and with his current ACTT team with [facility].  Reason for Continuation of Hospitalization: Medication stabilization  Estimated Length of Stay: Today 02/15/2019  Recreational Therapy: Patient Stressors: N/A Patient Goal: Patient will engage in groups without prompting or encouragement from LRT x3 group sessions within 5 recreation therapy group sessions  Attendees: Patient: 02/15/2019 9:32 AM  Physician:  02/15/2019 9:32 AM  Nursing:  02/15/2019 9:32 AM  RN Care Manager: 02/15/2019 9:32 AM  Social Worker:  Iris Pert, LCSW 02/15/2019 9:32 AM  Recreational Therapist:  02/15/2019 9:32 AM  Other:  02/15/2019 9:32 AM  Other:  02/15/2019 9:32 AM  Other: 02/15/2019 9:32 AM    Scribe for Treatment Team: Charlann Lange Ryota Treece, LCSW 02/15/2019 9:32 AM
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
## 2019-02-15 NOTE — Plan of Care (Signed)
Patient stated to this writer that he is feeling better and thinks he will be leaving this week.   Problem: Safety: Goal: Ability to disclose and discuss suicidal ideas will improve Outcome: Progressing   Problem: Self-Concept: Goal: Level of anxiety will decrease Outcome: Progressing

## 2019-02-15 NOTE — Progress Notes (Signed)
CSW left a message for pts legal guardian, Allegra Grana informing her that pt will be discharging today and to inquire about transportation for pt at discharge and requesting a call back.   Iris Pert, MSW, LCSW Clinical Social Work 02/15/2019 9:49 AM

## 2019-02-15 NOTE — [facility] Suicide Risk Assessment (Signed)
[facility] Discharge Suicide Risk Assessment   Principal Problem: MDD (major depressive disorder), recurrent severe, without psychosis (HCC) Discharge Diagnoses: Principal Problem:   MDD (major depressive disorder), recurrent severe, without psychosis (HCC) Active Problems:   Schizoaffective disorder, unspecified type (HCC)   Essential hypertension   COPD (chronic obstructive pulmonary disease) (HCC)   Convulsions (HCC)   Total Time spent with patient: 45 minutes  Musculoskeletal: Strength & Muscle Tone: within normal limits Gait & Station: normal Patient leans: N/A  Psychiatric Specialty Exam: Review of Systems  Constitutional: Negative.   HENT: Negative.   Eyes: Negative.   Respiratory: Negative.   Cardiovascular: Negative.   Gastrointestinal: Negative.   Musculoskeletal: Negative.   Skin: Negative.   Neurological: Negative.   Psychiatric/Behavioral: Negative.     Blood pressure 129/77, pulse 61, temperature 97.8 F (36.6 C), resp. rate 20, height 5\' 7"  (1.702 m), weight 74.4 kg, SpO2 99 %.Body mass index is 25.69 kg/m.  General Appearance: Disheveled  Eye Solicitor::  Fair  Speech:  Normal Rate409  Volume:  Decreased  Mood:  Dysphoric  Affect:  Constricted  Thought Process:  Goal Directed  Orientation:  Full (Time, Place, and Person)  Thought Content:  Logical  Suicidal Thoughts:  No  Homicidal Thoughts:  No  Memory:  Immediate;   Fair Recent;   Fair Remote;   Fair  Judgement:  Fair  Insight:  Fair  Psychomotor Activity:  Decreased  Concentration:  Fair  Recall:  [employer] of Knowledge:Fair  Language: Fair  Akathisia:  No  Handed:  Right  AIMS (if indicated):     Assets:  Desire for Improvement Financial Resources/Insurance Housing Physical Health Social Support  Sleep:  Number of Hours: 8  Cognition: Impaired,  Mild  ADL's:  Impaired   Mental Status Per Nursing Assessment::   On Admission:  Suicidal ideation indicated by patient  Demographic  Factors:  Male, Divorced or widowed, Caucasian and Unemployed  Loss Factors: Decrease in vocational status  Historical Factors: Prior suicide attempts and Impulsivity  Risk Reduction Factors:   Religious beliefs about death, Living with another person, especially a relative, Positive social support and Positive therapeutic relationship  Continued Clinical Symptoms:  Bipolar Disorder:   Mixed State Schizophrenia:   Depressive state  Cognitive Features That Contribute To Risk:  Closed-mindedness    Suicide Risk:  Minimal: No identifiable suicidal ideation.  Patients presenting with no risk factors but with morbid ruminations; may be classified as minimal risk based on the severity of the depressive symptoms  Follow-up Information    [facility] Follow up.   Why:  Continue to follow up with your ACTT team as usual. Thank You! Contact information: [address] [place] [address] [phone]        [facility] Follow up on 02/24/2019.   Why:  You have ECT scheduled for Monday 02/24/2019 at 8:45 AM, with Dr. Toni Amend. Thank you!  Contact information: [address] [id number] [address] [place] [address] [phone]          Plan Of Care/Follow-up recommendations:  Activity:  Activity as tolerated Diet:  Regular diet Other:  Follow-up with outpatient providers including returning for ECT in 1 week.  Otherwise follow-up with his act team.  Go back to the group home and try to make the best of things.  [name], MD 02/15/2019, 1:19 PM

## 2019-02-15 NOTE — Progress Notes (Signed)
Patient ID: Jason Patterson, male   DOB: 08/06/1956, 63 y.o.   MRN: [id number]   Discharge Note:  Patient denies SI/HI/AVH at this time. Discharge instructions, AVS, prescriptions, and transition record gone over with patient. Patient agrees to comply with medication management, follow-up visit, and outpatient therapy. Patient belongings returned to patient. Patient questions and concerns addressed and answered. Patient ambulatory off unit. Patient discharged to home with Ms. Roena Malady, a worker from his family care home.

## 2019-02-15 NOTE — Anesthesia Procedure Notes (Signed)
Date/Time: 02/15/2019 11:00 AM Performed by: Lily Kocher, CRNA Pre-anesthesia Checklist: Patient identified, Emergency Drugs available, Suction available and Patient being monitored Patient Re-evaluated:Patient Re-evaluated prior to induction Oxygen Delivery Method: Circle system utilized Preoxygenation: Pre-oxygenation with 100% oxygen Induction Type: IV induction Ventilation: Mask ventilation without difficulty and Mask ventilation throughout procedure Airway Equipment and Method: Bite block Placement Confirmation: positive ETCO2 Dental Injury: Teeth and Oropharynx as per pre-operative assessment

## 2019-02-15 NOTE — [facility] Suicide Risk Assessment (Signed)
[facility] INPATIENT:  Family/Significant Other Suicide Prevention Education  Suicide Prevention Education:  Education Completed; Jason Patterson, legal guardian [phone] has been identified by the patient as the family member/significant other with whom the patient will be residing, and identified as the person(s) who will aid the patient in the event of a mental health crisis (suicidal ideations/suicide attempt).  With written consent from the patient, the family member/significant other has been provided the following suicide prevention education, prior to the and/or following the discharge of the patient.  The suicide prevention education provided includes the following:  Suicide risk factors  Suicide prevention and interventions  National Suicide Hotline telephone number  [facility] assessment telephone number  [facility] Emergency Assistance 911  [facility] and/or Residential Mobile Crisis Unit telephone number  Request made of family/significant other to:  Remove weapons (e.g., guns, rifles, knives), all items previously/currently identified as safety concern.    Remove drugs/medications (over-the-counter, prescriptions, illicit drugs), all items previously/currently identified as a safety concern.  The family member/significant other verbalizes understanding of the suicide prevention education information provided.  The family member/significant other agrees to remove the items of safety concern listed above.  Jason Patterson reported that this is what pt does. Some days pt reports he loves the [facility] and other days states he hates it and they treat him bad. Jason Patterson stated that pt will be returning to the group home at discharge.  Jason Patterson Jason Patterson MSW LCSW 02/15/2019, 9:00 AM

## 2019-02-15 NOTE — Anesthesia Preprocedure Evaluation (Signed)
Anesthesia Evaluation  Patient identified by MRN, date of birth, ID band Patient awake    Reviewed: Allergy & Precautions, NPO status , Patient's Chart, lab work & pertinent test results  History of Anesthesia Complications Negative for: history of anesthetic complications  Airway Mallampati: III  TM Distance: >3 FB     Dental  (+) Poor Dentition   Pulmonary COPD,  COPD inhaler, neg recent URI, Current Smoker,           Cardiovascular hypertension, Pt. on medications (-) CAD, (-) Past MI, (-) Cardiac Stents and (-) CABG (-) dysrhythmias (RBBB)  - Systolic murmurs    Neuro/Psych  Headaches, Seizures -, Well Controlled,  PSYCHIATRIC DISORDERS Anxiety Depression Bipolar Disorder Schizophrenia    GI/Hepatic Neg liver ROS, GERD  ,  Endo/Other  negative endocrine ROSneg diabetes  Renal/GU negative Renal ROS     Musculoskeletal negative musculoskeletal ROS (+)   Abdominal (+) - obese,   Peds  Hematology negative hematology ROS (+)   Anesthesia Other Findings Past Medical History: No date: Anxiety No date: Bipolar disorder, unspecified (HCC) No date: Coarse tremors No date: COPD (chronic obstructive pulmonary disease) (HCC) No date: Depression No date: Diastolic dysfunction No date: Dysrhythmia No date: Essential hypertension, benign No date: Generalized anxiety disorder No date: GERD (gastroesophageal reflux disease) No date: Headache(784.0) No date: Physical deconditioning No date: Psoriasis No date: Rhabdomyolysis No date: Schizoaffective disorder, unspecified condition No date: Seizure disorder (HCC) No date: Seizures (HCC)     Comment:  NONE SINCE AGE 8 No date: Sepsis (HCC) No date: Shortness of breath     Comment:  W/ EXERTION    Reproductive/Obstetrics                             Anesthesia Physical  Anesthesia Plan  ASA: III  Anesthesia Plan: General   Post-op Pain  Management:    Induction: Intravenous  PONV Risk Score and Plan:   Airway Management Planned: Mask  Additional Equipment:   Intra-op Plan:   Post-operative Plan:   Informed Consent: I have reviewed the patients History and Physical, chart, labs and discussed the procedure including the risks, benefits and alternatives for the proposed anesthesia with the patient or authorized representative who has indicated his/her understanding and acceptance.     Dental advisory given  Plan Discussed with: CRNA and Anesthesiologist  Anesthesia Plan Comments:         Anesthesia Quick Evaluation

## 2019-02-15 NOTE — Procedures (Signed)
ECT SERVICES Physician's Interval Evaluation & Treatment Note  Patient Identification: Jason Patterson MRN:  [id number] Date of Evaluation:  02/15/2019 TX #: 12  MADRS:   MMSE:   P.E. Findings:  No change  Psychiatric Interval Note:  Anxious but not psychotic  Subjective:  Patient is a 63 y.o. male seen for evaluation for Electroconvulsive Therapy. anxious  Treatment Summary:   [x]   Right Unilateral             []  Bilateral   % Energy : 0.33ms, 100%   Impedance: 1910 ohms  Seizure Energy Index: 3054 microvolts sq  Postictal Suppression Index: 71%  Seizure Concordance Index: 87%  Medications  Pre Shock: brev 70 mg, suc 80 mg  Post Shock:    Seizure Duration:  15 sec emg, 33 sec eeg   Comments: Follow up one week   Lungs:  [x]   Clear to auscultation               []  Other:   Heart:    [x]   Regular rhythm             []  irregular rhythm    [x]   Previous H&P reviewed, patient examined and there are NO CHANGES                 []   Previous H&P reviewed, patient examined and there are changes noted.   [name], MD 3/30/202011:20 AM

## 2019-02-15 NOTE — Progress Notes (Signed)
Recreation Therapy Notes  INPATIENT RECREATION TR PLAN  Patient Details Name: Jason Patterson MRN: [id number] DOB: 06/15/1956 Today's Date: 02/15/2019  Rec Therapy Plan Is patient appropriate for Therapeutic Recreation?: Yes Treatment times per week: at least 3 Estimated Length of Stay: 5-7 days TR Treatment/Interventions: Group participation (Comment)  Discharge Criteria Pt will be discharged from therapy if:: Discharged Treatment plan/goals/alternatives discussed and agreed upon by:: Patient/family  Discharge Summary Short term goals set: Patient will engage in groups without prompting or encouragement from LRT x3 group sessions within 5 recreation therapy group sessions Short term goals met: Not met Reason goals not met: Patient did not attend any groups Therapeutic equipment acquired: N/A Reason patient discharged from therapy: Discharge from hospital Pt/family agrees with progress & goals achieved: Yes Date patient discharged from therapy: 02/15/19   Alani Sabbagh 02/15/2019, 11:30 AM

## 2019-02-15 NOTE — Progress Notes (Signed)
Recreation Therapy Notes  Date: 02/15/2019  Time: 9:30 am   Location: Craft room   Behavioral response: N/A   Intervention Topic: Problem Solving  Discussion/Intervention: Patient did not attend group.   Clinical Observations/Feedback:  Patient did not attend group.   Toshia Larkin LRT/CTRS        Aviendha Azbell 02/15/2019 10:55 AM

## 2019-02-15 NOTE — Plan of Care (Signed)
D- Patient alert and oriented. Patient presents in a sad, but present mood on assessment stating that he's feeling "bad", but he doesn't know why. Per his self-inventory, he rated his depression and anxiety a "7/10", stating "I just am" and he doesn't know why he's anxious. Patient denies SI, HI, AVH, and pain at this time. Patient had no stated goals for today.  A- Scheduled medications administered to patient, per MD orders. Support and encouragement provided.  Routine safety checks conducted every 15 minutes.  Patient informed to notify staff with problems or concerns.  R- No adverse drug reactions noted. Patient contracts for safety at this time. Patient compliant with medications and treatment plan. Patient receptive, calm, and cooperative. Patient interacts well with others on the unit.  Patient remains safe at this time.   Problem: Education: Goal: Knowledge of [place] [facility] Education information/materials will improve Outcome: Progressing   Problem: Coping: Goal: Coping ability will improve Outcome: Progressing Goal: Will verbalize feelings Outcome: Progressing   Problem: Safety: Goal: Ability to disclose and discuss suicidal ideas will improve Outcome: Progressing   Problem: Self-Concept: Goal: Ability to identify factors that promote anxiety will improve Outcome: Progressing Goal: Level of anxiety will decrease Outcome: Progressing Goal: Ability to modify response to factors that promote anxiety will improve Outcome: Progressing

## 2019-02-15 NOTE — Progress Notes (Signed)
CSW spoke with Allegra Grana, pts legal guardian who reported that [name] usually picks pt up and transports him back to their facility at discharge. Silvio Pate reported that she would like a copy of his discharge paperwork for the agency records and provided CSW her fax number.  CSW called [facility] at [phone] to inform them that pt will be discharging today and inquire if they will be picking pt up at discharge. They reported that they will pick him up sometime after 12 and will call CSW back with a time they are available to come.   Iris Pert, MSW, LCSW Clinical Social Work 02/15/2019 9:59 AM

## 2019-02-15 NOTE — Transfer of Care (Signed)
Immediate Anesthesia Transfer of Care Note  Patient: Jason Patterson  Procedure(s) Performed: ECT TX  Patient Location: PACU  Anesthesia Type:General  Level of Consciousness: sedated  Airway & Oxygen Therapy: Patient Spontanous Breathing and Patient connected to face mask oxygen  Post-op Assessment: Report given to RN and Post -op Vital signs reviewed and stable  Post vital signs: Reviewed and stable  Last Vitals:  Vitals Value Taken Time  BP 169/86 02/15/2019 11:10 AM  Temp    Pulse 62 02/15/2019 11:11 AM  Resp 15 02/15/2019 11:11 AM  SpO2 97 % 02/15/2019 11:11 AM  Vitals shown include unvalidated device data.  Last Pain:  Vitals:   02/15/19 1015  TempSrc: Oral  PainSc: 0-No pain         Complications: No apparent anesthesia complications

## 2019-02-15 NOTE — Progress Notes (Signed)
Patient has ECT scheduled for today, so his morning medications have been held until he returns to the unit.
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
## 2019-02-22 ENCOUNTER — Telehealth: Payer: Self-pay

## 2019-02-24 ENCOUNTER — Other Ambulatory Visit: Payer: Self-pay | Admitting: Psychiatry

## 2019-02-24 ENCOUNTER — Encounter: Payer: Self-pay | Admitting: Anesthesiology

## 2019-02-24 ENCOUNTER — Other Ambulatory Visit: Payer: Self-pay

## 2019-02-24 ENCOUNTER — Ambulatory Visit: Payer: Self-pay | Admitting: Anesthesiology

## 2019-02-24 ENCOUNTER — Ambulatory Visit
Admission: RE | Admit: 2019-02-24 | Discharge: 2019-02-24 | Disposition: A | Discharge status: Home / Self Care | Payer: Medicaid Other | Source: Ambulatory Visit | Attending: Psychiatry | Admitting: Psychiatry

## 2019-02-24 DIAGNOSIS — J449 Chronic obstructive pulmonary disease, unspecified: Secondary | ICD-10-CM | POA: Insufficient documentation

## 2019-02-24 DIAGNOSIS — R45851 Suicidal ideations: Secondary | ICD-10-CM | POA: Insufficient documentation

## 2019-02-24 DIAGNOSIS — I10 Essential (primary) hypertension: Secondary | ICD-10-CM | POA: Diagnosis not present

## 2019-02-24 DIAGNOSIS — F17210 Nicotine dependence, cigarettes, uncomplicated: Secondary | ICD-10-CM | POA: Insufficient documentation

## 2019-02-24 DIAGNOSIS — Z883 Allergy status to other anti-infective agents status: Secondary | ICD-10-CM | POA: Diagnosis not present

## 2019-02-24 DIAGNOSIS — Z880 Allergy status to penicillin: Secondary | ICD-10-CM | POA: Diagnosis not present

## 2019-02-24 DIAGNOSIS — F251 Schizoaffective disorder, depressive type: Secondary | ICD-10-CM

## 2019-02-24 DIAGNOSIS — Z888 Allergy status to other drugs, medicaments and biological substances status: Secondary | ICD-10-CM | POA: Diagnosis not present

## 2019-02-24 DIAGNOSIS — Z886 Allergy status to analgesic agent status: Secondary | ICD-10-CM | POA: Insufficient documentation

## 2019-02-24 DIAGNOSIS — F411 Generalized anxiety disorder: Secondary | ICD-10-CM | POA: Diagnosis not present

## 2019-02-24 DIAGNOSIS — G40909 Epilepsy, unspecified, not intractable, without status epilepticus: Secondary | ICD-10-CM | POA: Diagnosis not present

## 2019-02-24 DIAGNOSIS — F259 Schizoaffective disorder, unspecified: Secondary | ICD-10-CM | POA: Diagnosis not present

## 2019-02-24 DIAGNOSIS — F319 Bipolar disorder, unspecified: Secondary | ICD-10-CM | POA: Diagnosis not present

## 2019-02-24 MED ORDER — SUCCINYLCHOLINE CHLORIDE 200 MG/10ML IV SOSY
PREFILLED_SYRINGE | INTRAVENOUS | Status: DC | PRN
  Administered 2019-02-24: 100 mg via INTRAVENOUS

## 2019-02-24 MED ORDER — SODIUM CHLORIDE 0.9 % IV SOLN
500.0000 mL | Freq: Once | INTRAVENOUS | Status: AC
  Administered 2019-02-24: 10:00:00 500 mL via INTRAVENOUS

## 2019-02-24 MED ORDER — METHOHEXITAL SODIUM 0.5 G IJ SOLR
INTRAMUSCULAR | Status: AC
  Filled 2019-02-24: qty 500

## 2019-02-24 MED ORDER — METHOHEXITAL SODIUM 100 MG/10ML IV SOSY
PREFILLED_SYRINGE | INTRAVENOUS | Status: DC | PRN
  Administered 2019-02-24: 70 mg via INTRAVENOUS

## 2019-02-24 MED ORDER — SUCCINYLCHOLINE CHLORIDE 20 MG/ML IJ SOLN
INTRAMUSCULAR | Status: AC
  Filled 2019-02-24: qty 1

## 2019-02-24 NOTE — Procedures (Signed)
ECT SERVICES Physician's Interval Evaluation & Treatment Note  Patient Identification: Jason Patterson MRN:  [id number] Date of Evaluation:  02/24/2019 TX #: 13  MADRS:   MMSE:   P.E. Findings:  No change physical exam  Psychiatric Interval Note:  Chronic mood condition but easily shared.  Talks about being "suicidal" without any connection to any clear intent to harm self  Subjective:  Patient is a 63 y.o. male seen for evaluation for Electroconvulsive Therapy. No change to basic complaints  Treatment Summary:   [x]   Right Unilateral             []  Bilateral   % Energy : 0.3 ms 100%   Impedance: 1500 ohms  Seizure Energy Index: 4964 V squared  Postictal Suppression Index: 63%  Seizure Concordance Index: 81%  Medications  Pre Shock: Robinul 0.1 mg Toradol 30 mg Brevital 70 mg succinylcholine 100 mg  Post Shock:    Seizure Duration: 15 seconds EMG 40 seconds EEG   Comments: Follow-up in about 2 weeks  Lungs:  [x]   Clear to auscultation               []  Other:   Heart:    [x]   Regular rhythm             []  irregular rhythm    [x]   Previous H&P reviewed, patient examined and there are NO CHANGES                 []   Previous H&P reviewed, patient examined and there are changes noted.   [name], MD 4/8/202010:13 AM

## 2019-02-24 NOTE — Transfer of Care (Signed)
Immediate Anesthesia Transfer of Care Note  Patient: Jason Patterson  Procedure(s) Performed: ECT TX  Patient Location: PACU  Anesthesia Type:General  Level of Consciousness: sedated  Airway & Oxygen Therapy: Patient Spontanous Breathing and Patient connected to face mask oxygen  Post-op Assessment: Report given to RN and Post -op Vital signs reviewed and stable  Post vital signs: Reviewed and stable  Last Vitals:  Vitals Value Taken Time  BP 128/82 02/24/2019 10:33 AM  Temp 37.1 C 02/24/2019 10:33 AM  Pulse 74 02/24/2019 10:37 AM  Resp 24 02/24/2019 10:39 AM  SpO2 100 % 02/24/2019 10:37 AM  Vitals shown include unvalidated device data.  Last Pain:  Vitals:   02/24/19 1033  PainSc: 0-No pain         Complications: No apparent anesthesia complications
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
## 2019-02-24 NOTE — Discharge Instructions (Addendum)
1)  The drugs that you have been given will stay in your system until tomorrow so for the       next 24 hours you should not:  A. Drive an automobile  B. Make any legal decisions  C. Drink any alcoholic beverages  2)  You may resume your regular meals upon return home.  3)  A responsible adult must take you home.  Someone should stay with you for a few          hours, then be available by phone for the remainder of the treatment day.  4)  You May experience any of the following symptoms:  Headache, Nausea and a dry mouth (due to the medications you were given),  temporary memory loss and some confusion, or sore muscles (a warm bath  should help this).  If you you experience any of these symptoms let us know on                your return visit.  5)  Report any of the following: any acute discomfort, severe headache, or temperature        greater than 100.5 F.   Also report any unusual redness, swelling, drainage, or pain         at your IV site.    You may report Symptoms to:  ECT PROGRAM- [place] [facility]          Phone: [phone], ECT Department           or Dr. Shary Key office [phone]  6)  Your next ECT Treatment is Wednesday April 22  We will call 2 days prior to your scheduled appointment for arrival times.  7)  Nothing to eat or drink after midnight the night before your procedure.  8)  Take     With a sip of water the morning of your procedure.  9)  Other Instructions: Call [phone] to cancel the morning of your procedure due         to illness or emergency.  10) We will call within 72 hours to assess how you are feeling.

## 2019-02-24 NOTE — Anesthesia Post-op Follow-up Note (Signed)
Anesthesia QCDR form completed.        

## 2019-02-24 NOTE — Anesthesia Postprocedure Evaluation (Signed)
Anesthesia Post Note  Patient: Jason Patterson  Procedure(s) Performed: ECT TX  Patient location during evaluation: PACU Anesthesia Type: General Level of consciousness: awake and alert Pain management: pain level controlled Vital Signs Assessment: post-procedure vital signs reviewed and stable Respiratory status: spontaneous breathing, nonlabored ventilation, respiratory function stable and patient connected to nasal cannula oxygen Cardiovascular status: blood pressure returned to baseline and stable Postop Assessment: no apparent nausea or vomiting Anesthetic complications: no     Last Vitals:  Vitals:   02/24/19 1043 02/24/19 1053  BP: (!) 116/99 124/87  Pulse: 78 81  Resp: (!) 23 19  Temp:  36.6 C  SpO2: 92% 95%    Last Pain:  Vitals:   02/24/19 1053  PainSc: 0-No pain                 Jovita Gamma

## 2019-02-24 NOTE — Anesthesia Preprocedure Evaluation (Signed)
Anesthesia Evaluation  Patient identified by MRN, date of birth, ID band Patient awake    Reviewed: Allergy & Precautions, NPO status , Patient's Chart, lab work & pertinent test results  History of Anesthesia Complications Negative for: history of anesthetic complications  Airway Mallampati: III  TM Distance: >3 FB     Dental  (+) Edentulous Upper   Pulmonary COPD,  COPD inhaler, neg recent URI, Current Smoker,           Cardiovascular hypertension, Pt. on medications (-) CAD, (-) Past MI, (-) Cardiac Stents and (-) CABG (-) dysrhythmias (RBBB)  - Systolic murmurs    Neuro/Psych  Headaches, Seizures -, Well Controlled,  PSYCHIATRIC DISORDERS Anxiety Depression Bipolar Disorder Schizophrenia    GI/Hepatic Neg liver ROS, GERD  ,  Endo/Other  negative endocrine ROSneg diabetes  Renal/GU negative Renal ROS     Musculoskeletal negative musculoskeletal ROS (+)   Abdominal (+) - obese,   Peds  Hematology negative hematology ROS (+)   Anesthesia Other Findings Past Medical History: No date: Anxiety No date: Bipolar disorder, unspecified (HCC) No date: Coarse tremors No date: COPD (chronic obstructive pulmonary disease) (HCC) No date: Depression No date: Diastolic dysfunction No date: Dysrhythmia No date: Essential hypertension, benign No date: Generalized anxiety disorder No date: GERD (gastroesophageal reflux disease) No date: Headache(784.0) No date: Physical deconditioning No date: Psoriasis No date: Rhabdomyolysis No date: Schizoaffective disorder, unspecified condition No date: Seizure disorder (HCC) No date: Seizures (HCC)     Comment:  NONE SINCE AGE 63 No date: Sepsis (HCC) No date: Shortness of breath     Comment:  W/ EXERTION    Reproductive/Obstetrics                             Anesthesia Physical  Anesthesia Plan  ASA: III  Anesthesia Plan: General   Post-op Pain  Management:    Induction: Intravenous  PONV Risk Score and Plan:   Airway Management Planned: Mask  Additional Equipment:   Intra-op Plan:   Post-operative Plan:   Informed Consent: I have reviewed the patients History and Physical, chart, labs and discussed the procedure including the risks, benefits and alternatives for the proposed anesthesia with the patient or authorized representative who has indicated his/her understanding and acceptance.     Dental advisory given  Plan Discussed with: CRNA and Anesthesiologist  Anesthesia Plan Comments:         Anesthesia Quick Evaluation

## 2019-03-08 ENCOUNTER — Telehealth: Payer: Self-pay

## 2019-03-09 ENCOUNTER — Other Ambulatory Visit: Payer: Self-pay | Admitting: Psychiatry

## 2019-03-10 ENCOUNTER — Other Ambulatory Visit: Payer: Self-pay

## 2019-03-10 ENCOUNTER — Encounter: Payer: Self-pay | Admitting: Anesthesiology

## 2019-03-10 ENCOUNTER — Encounter
Admission: RE | Admit: 2019-03-10 | Discharge: 2019-03-10 | Disposition: A | Discharge status: Home / Self Care | Payer: Medicaid Other | Source: Ambulatory Visit | Attending: Psychiatry | Admitting: Psychiatry

## 2019-03-10 DIAGNOSIS — J449 Chronic obstructive pulmonary disease, unspecified: Secondary | ICD-10-CM | POA: Insufficient documentation

## 2019-03-10 DIAGNOSIS — Z888 Allergy status to other drugs, medicaments and biological substances status: Secondary | ICD-10-CM | POA: Diagnosis not present

## 2019-03-10 DIAGNOSIS — F17210 Nicotine dependence, cigarettes, uncomplicated: Secondary | ICD-10-CM | POA: Diagnosis not present

## 2019-03-10 DIAGNOSIS — F259 Schizoaffective disorder, unspecified: Secondary | ICD-10-CM | POA: Diagnosis not present

## 2019-03-10 DIAGNOSIS — F332 Major depressive disorder, recurrent severe without psychotic features: Secondary | ICD-10-CM

## 2019-03-10 DIAGNOSIS — Z880 Allergy status to penicillin: Secondary | ICD-10-CM | POA: Insufficient documentation

## 2019-03-10 DIAGNOSIS — F411 Generalized anxiety disorder: Secondary | ICD-10-CM | POA: Insufficient documentation

## 2019-03-10 DIAGNOSIS — F339 Major depressive disorder, recurrent, unspecified: Secondary | ICD-10-CM | POA: Insufficient documentation

## 2019-03-10 DIAGNOSIS — I10 Essential (primary) hypertension: Secondary | ICD-10-CM | POA: Insufficient documentation

## 2019-03-10 MED ORDER — SODIUM CHLORIDE 0.9 % IV SOLN
500.0000 mL | Freq: Once | INTRAVENOUS | Status: AC
  Administered 2019-03-10: 10:00:00 500 mL via INTRAVENOUS

## 2019-03-10 MED ORDER — GLYCOPYRROLATE 0.2 MG/ML IJ SOLN
INTRAMUSCULAR | Status: AC
  Filled 2019-03-10: qty 1

## 2019-03-10 MED ORDER — METHOHEXITAL SODIUM 100 MG/10ML IV SOSY
PREFILLED_SYRINGE | INTRAVENOUS | Status: DC | PRN
  Administered 2019-03-10: 70 mg via INTRAVENOUS

## 2019-03-10 MED ORDER — KETOROLAC TROMETHAMINE 30 MG/ML IJ SOLN
30.0000 mg | Freq: Once | INTRAMUSCULAR | Status: AC
  Administered 2019-03-10: 30 mg via INTRAVENOUS

## 2019-03-10 MED ORDER — SODIUM CHLORIDE 0.9 % IV SOLN
INTRAVENOUS | Status: DC | PRN
  Administered 2019-03-10: 10:00:00 via INTRAVENOUS

## 2019-03-10 MED ORDER — KETOROLAC TROMETHAMINE 60 MG/2ML IM SOLN
INTRAMUSCULAR | Status: AC
  Filled 2019-03-10: qty 2

## 2019-03-10 MED ORDER — SUCCINYLCHOLINE CHLORIDE 200 MG/10ML IV SOSY
PREFILLED_SYRINGE | INTRAVENOUS | Status: DC | PRN
  Administered 2019-03-10: 90 mg via INTRAVENOUS

## 2019-03-10 MED ORDER — GLYCOPYRROLATE 0.2 MG/ML IJ SOLN
0.1000 mg | Freq: Once | INTRAMUSCULAR | Status: AC
  Administered 2019-03-10: 10:00:00 0.1 mg via INTRAVENOUS

## 2019-03-10 MED ORDER — SUCCINYLCHOLINE CHLORIDE 20 MG/ML IJ SOLN
INTRAMUSCULAR | Status: AC
  Filled 2019-03-10: qty 1

## 2019-03-10 MED ORDER — METHOHEXITAL SODIUM 0.5 G IJ SOLR
INTRAMUSCULAR | Status: AC
  Filled 2019-03-10: qty 500

## 2019-03-10 NOTE — Transfer of Care (Signed)
Immediate Anesthesia Transfer of Care Note  Patient: Jason Patterson  Procedure(s) Performed: ECT TX  Patient Location: PACU  Anesthesia Type:General  Level of Consciousness: awake and sedated  Airway & Oxygen Therapy: Patient Spontanous Breathing and Patient connected to face mask oxygen  Post-op Assessment: Report given to RN and Post -op Vital signs reviewed and stable  Post vital signs: Reviewed and stable  Last Vitals:  Vitals Value Taken Time  BP 142/88 03/10/2019 10:27 AM  Temp    Pulse 69 03/10/2019 10:28 AM  Resp 24 03/10/2019 10:28 AM  SpO2 98 % 03/10/2019 10:28 AM  Vitals shown include unvalidated device data.  Last Pain:  Vitals:   03/10/19 0941  TempSrc:   PainSc: 0-No pain         Complications: No apparent anesthesia complications
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
## 2019-03-10 NOTE — Anesthesia Post-op Follow-up Note (Signed)
Anesthesia QCDR form completed.        

## 2019-03-10 NOTE — Anesthesia Preprocedure Evaluation (Signed)
Anesthesia Evaluation  Patient identified by MRN, date of birth, ID band Patient awake    Reviewed: Allergy & Precautions, NPO status , Patient's Chart, lab work & pertinent test results, reviewed documented beta blocker date and time   Airway Mallampati: III  TM Distance: >3 FB     Dental  (+) Chipped   Pulmonary shortness of breath, COPD, Current Smoker,           Cardiovascular hypertension, Pt. on medications + dysrhythmias      Neuro/Psych  Headaches, Seizures -,  PSYCHIATRIC DISORDERS Anxiety Depression Bipolar Disorder Schizophrenia  Neuromuscular disease    GI/Hepatic GERD  Controlled,  Endo/Other    Renal/GU Renal disease     Musculoskeletal   Abdominal   Peds  Hematology   Anesthesia Other Findings   Reproductive/Obstetrics                             Anesthesia Physical  Anesthesia Plan  ASA: III  Anesthesia Plan: General   Post-op Pain Management:    Induction: Intravenous  PONV Risk Score and Plan:   Airway Management Planned:   Additional Equipment:   Intra-op Plan:   Post-operative Plan:   Informed Consent: I have reviewed the patients History and Physical, chart, labs and discussed the procedure including the risks, benefits and alternatives for the proposed anesthesia with the patient or authorized representative who has indicated his/her understanding and acceptance.       Plan Discussed with: CRNA  Anesthesia Plan Comments:         Anesthesia Quick Evaluation  

## 2019-03-10 NOTE — Anesthesia Procedure Notes (Signed)
Date/Time: 03/10/2019 10:17 AM Performed by: Lily Kocher, CRNA Pre-anesthesia Checklist: Patient identified, Emergency Drugs available, Suction available and Patient being monitored Patient Re-evaluated:Patient Re-evaluated prior to induction Oxygen Delivery Method: Circle system utilized Preoxygenation: Pre-oxygenation with 100% oxygen Induction Type: IV induction Ventilation: Mask ventilation without difficulty and Mask ventilation throughout procedure Airway Equipment and Method: Bite block Placement Confirmation: positive ETCO2 Dental Injury: Teeth and Oropharynx as per pre-operative assessment

## 2019-03-10 NOTE — Anesthesia Postprocedure Evaluation (Signed)
Anesthesia Post Note  Patient: Jason Patterson  Procedure(s) Performed: ECT TX  Patient location during evaluation: PACU Anesthesia Type: General Level of consciousness: awake and alert Pain management: pain level controlled Vital Signs Assessment: post-procedure vital signs reviewed and stable Respiratory status: spontaneous breathing, nonlabored ventilation and respiratory function stable Cardiovascular status: blood pressure returned to baseline and stable Postop Assessment: no signs of nausea or vomiting Anesthetic complications: no     Last Vitals:  Vitals:   03/10/19 1049 03/10/19 1055  BP:  114/81  Pulse: 79 79  Resp: 15 16  Temp:  (!) 36.3 C  SpO2: 95%     Last Pain:  Vitals:   03/10/19 1055  TempSrc: Oral  PainSc: 0-No pain                 Nancie Bocanegra

## 2019-03-10 NOTE — Procedures (Signed)
ECT SERVICES Physician's Interval Evaluation & Treatment Note  Patient Identification: Jason Patterson MRN:  [id number] Date of Evaluation:  03/10/2019 TX #: [id number]  MADRS:   MMSE:   P.E. Findings:  No change physical exam  Psychiatric Interval Note:  Doing well  Subjective:  Patient is a 63 y.o. male seen for evaluation for Electroconvulsive Therapy. No new complaints no suicidal ideation  Treatment Summary:   [x]   Right Unilateral             []  Bilateral   % Energy : 0.3 ms 100%   Impedance: 1870 ohms  Seizure Energy Index: 8459 V square  Postictal Suppression Index: 50%  Seizure Concordance Index: 79%  Medications  Pre Shock: Robinul 0.1 mg Toradol 30 mg Brevital 70 mg succinylcholine 100 mg  Post Shock:    Seizure Duration: 20 seconds EMG 46 seconds EEG   Comments: Follow-up 2 weeks  Lungs:  [x]   Clear to auscultation               []  Other:   Heart:    [x]   Regular rhythm             []  irregular rhythm    [x]   Previous H&P reviewed, patient examined and there are NO CHANGES                 []   Previous H&P reviewed, patient examined and there are changes noted.   [name], MD 4/22/202010:05 AM

## 2019-03-10 NOTE — Discharge Instructions (Signed)
1)  The drugs that you have been given will stay in your system until tomorrow so for the       next 24 hours you should not:  A. Drive an automobile  B. Make any legal decisions  C. Drink any alcoholic beverages  2)  You may resume your regular meals upon return home.  3)  A responsible adult must take you home.  Someone should stay with you for a few          hours, then be available by phone for the remainder of the treatment day.  4)  You May experience any of the following symptoms:  Headache, Nausea and a dry mouth (due to the medications you were given),  temporary memory loss and some confusion, or sore muscles (a warm bath  should help this).  If you you experience any of these symptoms let us know on                your return visit.  5)  Report any of the following: any acute discomfort, severe headache, or temperature        greater than 100.5 F.   Also report any unusual redness, swelling, drainage, or pain         at your IV site.    You may report Symptoms to:  ECT PROGRAM- [place] [facility]          Phone: [phone], ECT Department           or Dr. Shary Key office [phone]  6)  Your next ECT Treatment is Wednesday May 6  We will call 2 days prior to your scheduled appointment for arrival times.  7)  Nothing to eat or drink after midnight the night before your procedure.  8)  Take     With a sip of water the morning of your procedure.  9)  Other Instructions: Call [phone] to cancel the morning of your procedure due         to illness or emergency.  10) We will call within 72 hours to assess how you are feeling.

## 2019-03-11 ENCOUNTER — Other Ambulatory Visit: Payer: Self-pay

## 2019-03-11 ENCOUNTER — Encounter: Payer: Self-pay | Admitting: Psychiatry

## 2019-03-11 ENCOUNTER — Encounter: Payer: Self-pay | Admitting: Emergency Medicine

## 2019-03-11 ENCOUNTER — Inpatient Hospital Stay
Admission: AD | Admit: 2019-03-11 | Discharge: 2019-03-15 | DRG: 885 | Disposition: A | Discharge status: Home / Self Care | Payer: Medicaid Other | Source: Intra-hospital | Attending: Psychiatry | Admitting: Psychiatry

## 2019-03-11 ENCOUNTER — Emergency Department
Admission: EM | Admit: 2019-03-11 | Discharge: 2019-03-11 | Disposition: A | Discharge status: Other Acute Inpatient Hospital | Payer: Medicaid Other | Attending: Emergency Medicine | Admitting: Emergency Medicine

## 2019-03-11 DIAGNOSIS — F411 Generalized anxiety disorder: Secondary | ICD-10-CM | POA: Diagnosis present

## 2019-03-11 DIAGNOSIS — Z915 Personal history of self-harm: Secondary | ICD-10-CM | POA: Diagnosis not present

## 2019-03-11 DIAGNOSIS — K219 Gastro-esophageal reflux disease without esophagitis: Secondary | ICD-10-CM | POA: Diagnosis present

## 2019-03-11 DIAGNOSIS — F250 Schizoaffective disorder, bipolar type: Principal | ICD-10-CM | POA: Diagnosis present

## 2019-03-11 DIAGNOSIS — F259 Schizoaffective disorder, unspecified: Secondary | ICD-10-CM | POA: Insufficient documentation

## 2019-03-11 DIAGNOSIS — I10 Essential (primary) hypertension: Secondary | ICD-10-CM | POA: Diagnosis present

## 2019-03-11 DIAGNOSIS — F4329 Adjustment disorder with other symptoms: Secondary | ICD-10-CM | POA: Diagnosis not present

## 2019-03-11 DIAGNOSIS — Z0489 Encounter for examination and observation for other specified reasons: Secondary | ICD-10-CM | POA: Diagnosis present

## 2019-03-11 DIAGNOSIS — Z79899 Other long term (current) drug therapy: Secondary | ICD-10-CM | POA: Insufficient documentation

## 2019-03-11 DIAGNOSIS — Z880 Allergy status to penicillin: Secondary | ICD-10-CM | POA: Diagnosis not present

## 2019-03-11 DIAGNOSIS — Z888 Allergy status to other drugs, medicaments and biological substances status: Secondary | ICD-10-CM | POA: Diagnosis not present

## 2019-03-11 DIAGNOSIS — F419 Anxiety disorder, unspecified: Secondary | ICD-10-CM | POA: Diagnosis present

## 2019-03-11 DIAGNOSIS — F17210 Nicotine dependence, cigarettes, uncomplicated: Secondary | ICD-10-CM | POA: Diagnosis present

## 2019-03-11 DIAGNOSIS — J449 Chronic obstructive pulmonary disease, unspecified: Secondary | ICD-10-CM | POA: Insufficient documentation

## 2019-03-11 DIAGNOSIS — Z7982 Long term (current) use of aspirin: Secondary | ICD-10-CM | POA: Insufficient documentation

## 2019-03-11 DIAGNOSIS — R45851 Suicidal ideations: Secondary | ICD-10-CM | POA: Diagnosis present

## 2019-03-11 LAB — COMPREHENSIVE METABOLIC PANEL
ALT: 14 U/L (ref 0–44)
AST: 17 U/L (ref 15–41)
Albumin: 4.4 g/dL (ref 3.5–5.0)
Alkaline Phosphatase: 74 U/L (ref 38–126)
Anion gap: 11 (ref 5–15)
BUN: 13 mg/dL (ref 8–23)
CO2: 22 mmol/L (ref 22–32)
Calcium: 9 mg/dL (ref 8.9–10.3)
Chloride: 103 mmol/L (ref 98–111)
Creatinine, Ser: 0.82 mg/dL (ref 0.61–1.24)
GFR calc Af Amer: >60 mL/min (ref >60)
GFR calc non Af Amer: >60 mL/min (ref >60)
Glucose, Bld: 89 mg/dL (ref 70–99)
Potassium: 4.1 mmol/L (ref 3.5–5.1)
Sodium: 136 mmol/L (ref 135–145)
Total Bilirubin: 0.7 mg/dL (ref 0.3–1.2)
Total Protein: 7 g/dL (ref 6.5–8.1)

## 2019-03-11 LAB — CBC WITH DIFFERENTIAL/PLATELET
Abs Immature Granulocytes: 0.03 10*3/uL (ref 0.00–0.07)
Basophils Absolute: 0.1 10*3/uL (ref 0.0–0.1)
Basophils Relative: 1 %
Eosinophils Absolute: 0.1 10*3/uL (ref 0.0–0.5)
Eosinophils Relative: 1 %
HCT: 38.4 % — ABNORMAL LOW (ref 39.0–52.0)
Hemoglobin: 13.5 g/dL (ref 13.0–17.0)
Immature Granulocytes: 0 %
Lymphocytes Relative: 21 %
Lymphs Abs: 1.8 10*3/uL (ref 0.7–4.0)
MCH: 32.8 pg (ref 26.0–34.0)
MCHC: 35.2 g/dL (ref 30.0–36.0)
MCV: 93.4 fL (ref 80.0–100.0)
Monocytes Absolute: 0.3 10*3/uL (ref 0.1–1.0)
Monocytes Relative: 4 %
Neutro Abs: 6.4 10*3/uL (ref 1.7–7.7)
Neutrophils Relative %: 73 %
Platelets: 192 10*3/uL (ref 150–400)
RBC: 4.11 MIL/uL — ABNORMAL LOW (ref 4.22–5.81)
RDW: 13.4 % (ref 11.5–15.5)
WBC: 8.7 10*3/uL (ref 4.0–10.5)
nRBC: 0 % (ref 0.0–0.2)

## 2019-03-11 LAB — ACETAMINOPHEN LEVEL: Acetaminophen (Tylenol), Serum: <10 ug/mL — ABNORMAL LOW (ref 10–30)

## 2019-03-11 LAB — SALICYLATE LEVEL: Salicylate Lvl: <7 mg/dL (ref 2.8–30.0)

## 2019-03-11 LAB — ETHANOL: Alcohol, Ethyl (B): <10 mg/dL (ref <10)

## 2019-03-11 MED ORDER — ACETAMINOPHEN 325 MG PO TABS: 650.0000 mg | ORAL_TABLET | Freq: Four times a day (QID) | ORAL | Status: DC | PRN

## 2019-03-11 MED ORDER — VITAMIN D3 25 MCG (1000 UNIT) PO TABS
2000.0000 [IU] | ORAL_TABLET | Freq: Every day | ORAL | Status: DC
  Administered 2019-03-12 – 2019-03-15 (×4): 2000 [IU] via ORAL
  Filled 2019-03-11 (×8): qty 2

## 2019-03-11 MED ORDER — LATANOPROST 0.005 % OP SOLN
1.0000 [drp] | Freq: Every day | OPHTHALMIC | Status: DC
  Filled 2019-03-11: qty 2.5

## 2019-03-11 MED ORDER — METOPROLOL TARTRATE 25 MG PO TABS
12.5000 mg | ORAL_TABLET | Freq: Two times a day (BID) | ORAL | Status: DC
  Administered 2019-03-11: 12.5 mg via ORAL
  Filled 2019-03-11: qty 1

## 2019-03-11 MED ORDER — SERTRALINE HCL 50 MG PO TABS
100.0000 mg | ORAL_TABLET | Freq: Every day | ORAL | Status: DC
  Administered 2019-03-11: 14:00:00 100 mg via ORAL
  Filled 2019-03-11: qty 2

## 2019-03-11 MED ORDER — FAMOTIDINE 20 MG PO TABS
10.0000 mg | ORAL_TABLET | Freq: Two times a day (BID) | ORAL | Status: DC
  Administered 2019-03-11 – 2019-03-15 (×8): 10 mg via ORAL
  Filled 2019-03-11 (×8): qty 1

## 2019-03-11 MED ORDER — ASPIRIN EC 81 MG PO TBEC
81.0000 mg | DELAYED_RELEASE_TABLET | Freq: Every day | ORAL | Status: DC
  Administered 2019-03-11: 14:00:00 81 mg via ORAL
  Filled 2019-03-11: qty 1

## 2019-03-11 MED ORDER — FAMOTIDINE 20 MG PO TABS
10.0000 mg | ORAL_TABLET | Freq: Two times a day (BID) | ORAL | Status: DC
  Administered 2019-03-11: 10 mg via ORAL
  Filled 2019-03-11: qty 1

## 2019-03-11 MED ORDER — LOXAPINE SUCCINATE 5 MG PO CAPS
25.0000 mg | ORAL_CAPSULE | Freq: Three times a day (TID) | ORAL | Status: DC
  Administered 2019-03-12 – 2019-03-15 (×11): 25 mg via ORAL
  Filled 2019-03-11 (×11): qty 1

## 2019-03-11 MED ORDER — ADULT MULTIVITAMIN W/MINERALS CH
1.0000 | ORAL_TABLET | Freq: Every day | ORAL | Status: DC
  Administered 2019-03-12 – 2019-03-15 (×4): 1 via ORAL
  Filled 2019-03-11 (×4): qty 1

## 2019-03-11 MED ORDER — LORAZEPAM 1 MG PO TABS
1.0000 mg | ORAL_TABLET | ORAL | Status: AC | PRN
  Administered 2019-03-13: 08:00:00 1 mg via ORAL
  Filled 2019-03-11: qty 1

## 2019-03-11 MED ORDER — ALUM & MAG HYDROXIDE-SIMETH 200-200-20 MG/5ML PO SUSP: 30.0000 mL | ORAL | Status: DC | PRN

## 2019-03-11 MED ORDER — OLANZAPINE 5 MG PO TABS
15.0000 mg | ORAL_TABLET | Freq: Every day | ORAL | Status: DC
  Administered 2019-03-11 – 2019-03-12 (×2): 15 mg via ORAL
  Filled 2019-03-11 (×2): qty 1

## 2019-03-11 MED ORDER — ALBUTEROL SULFATE (2.5 MG/3ML) 0.083% IN NEBU: 3.0000 mL | INHALATION_SOLUTION | Freq: Four times a day (QID) | RESPIRATORY_TRACT | Status: DC | PRN

## 2019-03-11 MED ORDER — LOXAPINE SUCCINATE 5 MG PO CAPS
25.0000 mg | ORAL_CAPSULE | Freq: Three times a day (TID) | ORAL | Status: DC
  Administered 2019-03-11: 25 mg via ORAL
  Filled 2019-03-11 (×2): qty 1

## 2019-03-11 MED ORDER — ZIPRASIDONE MESYLATE 20 MG IM SOLR: 20.0000 mg | INTRAMUSCULAR | Status: DC | PRN

## 2019-03-11 MED ORDER — OLANZAPINE 5 MG PO TBDP: 10.0000 mg | ORAL_TABLET | Freq: Three times a day (TID) | ORAL | Status: DC | PRN

## 2019-03-11 MED ORDER — LEVETIRACETAM 500 MG PO TABS
1000.0000 mg | ORAL_TABLET | Freq: Two times a day (BID) | ORAL | Status: DC
  Administered 2019-03-11 – 2019-03-15 (×8): 1000 mg via ORAL
  Filled 2019-03-11 (×8): qty 2

## 2019-03-11 MED ORDER — ADULT MULTIVITAMIN W/MINERALS CH
1.0000 | ORAL_TABLET | Freq: Every day | ORAL | Status: DC
  Administered 2019-03-11: 1 via ORAL
  Filled 2019-03-11: qty 1

## 2019-03-11 MED ORDER — ASPIRIN EC 81 MG PO TBEC
81.0000 mg | DELAYED_RELEASE_TABLET | Freq: Every day | ORAL | Status: DC
  Administered 2019-03-12 – 2019-03-15 (×4): 81 mg via ORAL
  Filled 2019-03-11 (×4): qty 1

## 2019-03-11 MED ORDER — METOPROLOL TARTRATE 25 MG PO TABS
12.5000 mg | ORAL_TABLET | Freq: Two times a day (BID) | ORAL | Status: DC
  Administered 2019-03-12 – 2019-03-15 (×6): 12.5 mg via ORAL
  Filled 2019-03-11 (×7): qty 1

## 2019-03-11 MED ORDER — OLANZAPINE 5 MG PO TABS: 15.0000 mg | ORAL_TABLET | Freq: Every day | ORAL | Status: DC

## 2019-03-11 MED ORDER — LATANOPROST 0.005 % OP SOLN
1.0000 [drp] | Freq: Every day | OPHTHALMIC | Status: DC
  Administered 2019-03-11 – 2019-03-14 (×4): 1 [drp] via OPHTHALMIC
  Filled 2019-03-11: qty 2.5

## 2019-03-11 MED ORDER — MAGNESIUM HYDROXIDE 400 MG/5ML PO SUSP: 30.0000 mL | Freq: Every day | ORAL | Status: DC | PRN

## 2019-03-11 MED ORDER — ALBUTEROL SULFATE HFA 108 (90 BASE) MCG/ACT IN AERS
1.0000 | INHALATION_SPRAY | Freq: Four times a day (QID) | RESPIRATORY_TRACT | Status: DC | PRN
  Filled 2019-03-11: qty 6.7

## 2019-03-11 MED ORDER — VITAMIN D3 25 MCG (1000 UNIT) PO TABS
2000.0000 [IU] | ORAL_TABLET | Freq: Every day | ORAL | Status: DC
  Administered 2019-03-11: 16:00:00 2000 [IU] via ORAL
  Filled 2019-03-11: qty 2

## 2019-03-11 MED ORDER — SERTRALINE HCL 100 MG PO TABS
100.0000 mg | ORAL_TABLET | Freq: Every day | ORAL | Status: DC
  Administered 2019-03-12 – 2019-03-13 (×2): 100 mg via ORAL
  Filled 2019-03-11 (×2): qty 1

## 2019-03-11 MED ORDER — LEVETIRACETAM 500 MG PO TABS
1000.0000 mg | ORAL_TABLET | Freq: Two times a day (BID) | ORAL | Status: DC
  Administered 2019-03-11: 1000 mg via ORAL
  Filled 2019-03-11: qty 2

## 2019-03-11 NOTE — ED Notes (Signed)
Pt located out front in lobby returned to the quad to room 22.  Pt dressed out and placed in room 22.  Belongings include:  Blue pair of shoes, blue sweat pants, blue sweat shirt, blue plaid jacket, blue baseball cap.
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
## 2019-03-11 NOTE — Plan of Care (Signed)
Patient just recently admitted to the unit. Patient has not had sufficient time to show progressions at this time. Will continue to monitor for progressions.   Problem: Education: Goal: Knowledge of [facility] Education information/materials will improve Outcome: Not Progressing Goal: Emotional status will improve Outcome: Not Progressing Goal: Mental status will improve Outcome: Not Progressing   Problem: Health Behavior/Discharge Planning: Goal: Compliance with treatment plan for underlying cause of condition will improve Outcome: Not Progressing   Problem: Safety: Goal: Periods of time without injury will increase Outcome: Not Progressing   Problem: Medication: Goal: Compliance with prescribed medication regimen will improve Outcome: Not Progressing   Problem: Self-Concept: Goal: Ability to disclose and discuss suicidal ideas will improve Outcome: Not Progressing   Problem: Coping: Goal: Ability to identify and develop effective coping behavior will improve Outcome: Not Progressing

## 2019-03-11 NOTE — BH Assessment (Signed)
TTS telephoned [name] [phone] who was also agreeable for the patient to be admitted Voluntarily.
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
## 2019-03-11 NOTE — ED Notes (Signed)
Called BMU to try to give report.  Stated that patient would have to come next shift.

## 2019-03-11 NOTE — BH Assessment (Signed)
TTS arrived to assess patient, and patient is not present at his bed.  It seems he may have left care.

## 2019-03-11 NOTE — Tx Team (Signed)
Initial Treatment Plan 03/11/2019 10:36 PM [name] ENI:[id number]    PATIENT STRESSORS: Other: "I don't like my group home"  Depression Anxiety Suicidal thoughts   PATIENT STRENGTHS: Ability for insight Communication skills General fund of knowledge   PATIENT IDENTIFIED PROBLEMS: Suicidal Ideations 03/11/2019  Anxiety 03/11/2019  Depression 03/11/2019                 DISCHARGE CRITERIA:  Adequate post-discharge living arrangements Improved stabilization in mood, thinking, and/or behavior Motivation to continue treatment in a less acute level of care Need for constant or close observation no longer present Verbal commitment to aftercare and medication compliance  PRELIMINARY DISCHARGE PLAN: Outpatient therapy Placement in alternative living arrangements  PATIENT/FAMILY INVOLVEMENT: This treatment plan has been presented to and reviewed with the patient, Jason Patterson.The patient has been given the opportunity to ask questions and make suggestions.  Lenox Ponds, RN 03/11/2019, 10:36 PM
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
## 2019-03-11 NOTE — ED Notes (Signed)
Legal guardian notified that patient is here.

## 2019-03-11 NOTE — ED Notes (Signed)
Gave pt food tray with juice. 
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
## 2019-03-11 NOTE — ED Triage Notes (Signed)
Pt here voluntarily, states he does not want to stay at his home anymore because " they keep cussing at me" . PT lives at [facility]. PT denies any SI/HI. PT A&Ox4 . Psych hx

## 2019-03-11 NOTE — BH Assessment (Signed)
Patient is to be admitted to [facility] by Dr. Viviano Simas.  Attending Physician will be Dr. Toni Amend.   Patient has been assigned to room 320, by [facility] Charge Nurse Gwen.   Intake Paper Work has been signed and placed on patient chart.  ER staff is aware of the admission:  LuAnn, ER Secretary    Dr. Roxan Hockey, ER MD   Prudencio Burly, Patient's Nurse   Gaspar Garbe, Patient Access.

## 2019-03-11 NOTE — ED Notes (Signed)
Pt left the facility.  Guardian notified.

## 2019-03-12 DIAGNOSIS — F250 Schizoaffective disorder, bipolar type: Principal | ICD-10-CM

## 2019-03-12 NOTE — BHH Suicide Risk Assessment (Signed)
[facility] Admission Suicide Risk Assessment   Nursing information obtained from:  Patient, Review of record Demographic factors:  Male, Caucasian, Low socioeconomic status, Unemployed Current Mental Status:  NA(Denies) Loss Factors:  Loss of significant relationship Historical Factors:  Prior suicide attempts Risk Reduction Factors:  Positive social support  Total Time spent with patient: 1 hour Principal Problem: Schizoaffective disorder, bipolar type (HCC) Diagnosis:  Principal Problem:   Schizoaffective disorder, bipolar type (HCC) Active Problems:   Generalized anxiety disorder   Essential hypertension  Subjective Data: Patient seen chart reviewed.  Patient well-known from previous encounters.  Patient with chronic schizoaffective disorder with depressive symptoms came to the emergency room saying that he was having suicidal thoughts because they were so mean to him at his group home.  Claims that the people there are always unkind to him.  This is his chronic complaint.  He is not showing any signs of being acutely suicidal or threatening or violent here in the hospital.  Not reporting acute psychotic symptoms.  Continued Clinical Symptoms:  Alcohol Use Disorder Identification Test Final Score (AUDIT): 0 The "Alcohol Use Disorders Identification Test", Guidelines for Use in Primary Care, Second Edition.  World Science writer [facility]). Score between 0-7:  no or low risk or alcohol related problems. Score between 8-15:  moderate risk of alcohol related problems. Score between 16-19:  high risk of alcohol related problems. Score 20 or above:  warrants further diagnostic evaluation for alcohol dependence and treatment.   CLINICAL FACTORS:   Depression:   Impulsivity   Musculoskeletal: Strength & Muscle Tone: within normal limits Gait & Station: normal Patient leans: N/A  Psychiatric Specialty Exam: Physical Exam  Nursing note and vitals reviewed. Constitutional: He appears  well-developed and well-nourished.  HENT:  Head: Normocephalic and atraumatic.  Eyes: Pupils are equal, round, and reactive to light. Conjunctivae are normal.  Neck: Normal range of motion.  Cardiovascular: Regular rhythm and normal heart sounds.  Respiratory: Effort normal.  GI: Soft.  Musculoskeletal: Normal range of motion.  Neurological: He is alert.  Skin: Skin is warm and dry.  Psychiatric: He has a normal mood and affect. His behavior is normal. Judgment and thought content normal.    Review of Systems  Constitutional: Negative.   HENT: Negative.   Eyes: Negative.   Respiratory: Negative.   Cardiovascular: Negative.   Gastrointestinal: Negative.   Musculoskeletal: Negative.   Skin: Negative.   Neurological: Negative.   Psychiatric/Behavioral: Negative.     Blood pressure 112/76, pulse 88, temperature 98.7 F (37.1 C), temperature source Oral, resp. rate 16, height 5\' 7"  (1.702 m), weight 73 kg, SpO2 96 %.Body mass index is 25.22 kg/m.  General Appearance: Casual  Eye Contact:  Good  Speech:  Clear and Coherent  Volume:  Normal  Mood:  Euthymic  Affect:  Congruent  Thought Process:  Goal Directed  Orientation:  Full (Time, Place, and Person)  Thought Content:  Logical  Suicidal Thoughts:  No  Homicidal Thoughts:  No  Memory:  Immediate;   Fair Recent;   Fair Remote;   Fair  Judgement:  Fair  Insight:  Fair  Psychomotor Activity:  Decreased  Concentration:  Concentration: Fair  Recall:  [employer] of Knowledge:  Fair  Language:  Fair  Akathisia:  No  Handed:  Right  AIMS (if indicated):     Assets:  Desire for Improvement  ADL's:  Intact  Cognition:  WNL  Sleep:  Number of Hours: 6.25  COGNITIVE FEATURES THAT CONTRIBUTE TO RISK:  Polarized thinking    SUICIDE RISK:   Minimal: No identifiable suicidal ideation.  Patients presenting with no risk factors but with morbid ruminations; may be classified as minimal risk based on the severity of the  depressive symptoms  PLAN OF CARE: Patient admitted to the psychiatric unit.  15-minute checks in place.  Continue usual outpatient medicine.  Check labs.  Monitor behavior.  Engage patient in individual and group therapy.  Work on improving his ability to use better coping skills.  Reassess suicidality before discharge.  I certify that inpatient services furnished can reasonably be expected to improve the patient's condition.   [name] Clarice Zulauf, MD 03/12/2019, 5:44 PM

## 2019-03-12 NOTE — Progress Notes (Signed)
Recreation Therapy Notes  INPATIENT RECREATION THERAPY ASSESSMENT  Patient Details Name: Jason Patterson MRN: [id number] DOB: [birth year] Today's Date: 03/12/2019       Information Obtained From: Patient  Able to Participate in Assessment/Interview: Yes  Patient Presentation: Responsive  Reason for Admission (Per Patient): Active Symptoms  Patient Stressors: Other (Comment)(Group home)  Coping Skills:   Art  Leisure Interests (2+):  Art - Draw, Art - Coloring  Frequency of Recreation/Participation: Monthly  Awareness of Community Resources:     [employer]:     Current Use:    If no, Barriers?:    Expressed Interest in [employer] Information:    [place] of Residence:  [place]  Patient Main Form of Transportation: Other (Comment)(Group home)  Patient Strengths:  N/A  Patient Identified Areas of Improvement:  N/A  Patient Goal for Hospitalization:  Get into a new group home  Current SI (including self-harm):  No  Current HI:  No  Current AVH: No  Staff Intervention Plan: Collaborate with Interdisciplinary Treatment Team, Group Attendance  Consent to Intern Participation: N/A  Aurelie Dicenzo 03/12/2019, 1:33 PM

## 2019-03-12 NOTE — BHH Suicide Risk Assessment (Signed)
BHH INPATIENT:  Family/Significant Other Suicide Prevention Education  Suicide Prevention Education:  Education Completed; [name], legal guardian [phone] has been identified by the patient as the family member/significant other with whom the patient will be residing, and identified as the person(s) who will aid the patient in the event of a mental health crisis (suicidal ideations/suicide attempt).  With written consent from the patient, the family member/significant other has been provided the following suicide prevention education, prior to the and/or following the discharge of the patient.  The suicide prevention education provided includes the following:  Suicide risk factors  Suicide prevention and interventions  National Suicide Hotline telephone number  [facility] assessment telephone number  [facility] Emergency Assistance 911  [facility] and/or Residential Mobile Crisis Unit telephone number  Request made of family/significant other to:  Remove weapons (e.g., guns, rifles, knives), all items previously/currently identified as safety concern.    Remove drugs/medications (over-the-counter, prescriptions, illicit drugs), all items previously/currently identified as a safety concern.  The family member/significant other verbalizes understanding of the suicide prevention education information provided.  The family member/significant other agrees to remove the items of safety concern listed above. Ms. Jason Patterson reports the pt has a hx of psychiatric hospitalizations and was last seen at [facility] in March 2020. She reports Dr. Toni Amend administers ECT with pt every 2 weeks. She states prior to the pt being hospitalized he has been having no problems at his group home. She further states on the day he was hospitalized he left the premises and asked to be brought to the hospital. Ms. Jason Patterson reports he has had extended hospitalizations one being [facility] where he  resided for 2 years. She states the pt having a lack of coping skills and a history of self harming behavior. Per her report the pt attends [employer] and [employer] risk surveyor. She denies the pt having access to guns or weapons.  Jason Patterson 03/12/2019, 1:22 PM
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
## 2019-03-12 NOTE — [facility] Group Notes (Signed)
LCSW Group Therapy Note  03/12/2019 1:00 PM  Type of Therapy and Topic:  Group Therapy:  Feelings around Relapse and Recovery  Participation Level:  Did Not Attend   Description of Group:    Patients in this group will discuss emotions they experience before and after a relapse. They will process how experiencing these feelings, or avoidance of experiencing them, relates to having a relapse. Facilitator will guide patients to explore emotions they have related to recovery. Patients will be encouraged to process which emotions are more powerful. They will be guided to discuss the emotional reaction significant others in their lives may have to their relapse or recovery. Patients will be assisted in exploring ways to respond to the emotions of others without this contributing to a relapse.  Therapeutic Goals: 1. Patient will identify two or more emotions that lead to a relapse for them 2. Patient will identify two emotions that result when they relapse 3. Patient will identify two emotions related to recovery 4. Patient will demonstrate ability to communicate their needs through discussion and/or role plays   Summary of Patient Progress: X  Therapeutic Modalities:   Cognitive Behavioral Therapy Solution-Focused Therapy Assertiveness Training Relapse Prevention Therapy   Penni Homans, MSW, LCSW 03/12/2019 12:41 PM

## 2019-03-12 NOTE — Plan of Care (Signed)
Patient states "I am not suicidal now but at the group home when they cuss at me I was suicidal."Patient is pleasant and cooperative on approach.Compliant with medications.Attended groups.Appetite and energy level good.Support and encouragement given.
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
## 2019-03-12 NOTE — Tx Team (Addendum)
Interdisciplinary Treatment and Diagnostic Plan Update  03/12/2019 Time of Session: 230p [name] MRN: [id number]  Principal Diagnosis: <principal problem not specified>  Secondary Diagnoses: Active Problems:   Schizoaffective disorder, bipolar type (HCC)   Current Medications:  Current Facility-Administered Medications  Medication Dose Route Frequency Provider Last Rate Last Dose  . acetaminophen (TYLENOL) tablet 650 mg  650 mg Oral Q6H PRN Mariel Craft, MD      . albuterol (VENTOLIN HFA) 108 (90 Base) MCG/ACT inhaler 1 puff  1 puff Inhalation Q6H PRN Mariel Craft, MD      . alum & mag hydroxide-simeth (MAALOX/MYLANTA) 200-200-20 MG/5ML suspension 30 mL  30 mL Oral Q4H PRN Mariel Craft, MD      . aspirin EC tablet 81 mg  81 mg Oral Daily Mariel Craft, MD   81 mg at 03/12/19 5621  . cholecalciferol (VITAMIN D) tablet 2,000 Units  2,000 Units Oral Daily Mariel Craft, MD   2,000 Units at 03/12/19 3086  . famotidine (PEPCID) tablet 10 mg  10 mg Oral BID Mariel Craft, MD   10 mg at 03/12/19 0820  . latanoprost (XALATAN) 0.005 % ophthalmic solution 1 drop  1 drop Both Eyes QHS Mariel Craft, MD   1 drop at 03/11/19 2218  . levETIRAcetam (KEPPRA) tablet 1,000 mg  1,000 mg Oral BID Mariel Craft, MD   1,000 mg at 03/12/19 5784  . OLANZapine zydis (ZYPREXA) disintegrating tablet 10 mg  10 mg Oral Q8H PRN Mariel Craft, MD       And  . LORazepam (ATIVAN) tablet 1 mg  1 mg Oral PRN Mariel Craft, MD       And  . ziprasidone (GEODON) injection 20 mg  20 mg Intramuscular PRN Mariel Craft, MD      . loxapine Ocie Bob) capsule 25 mg  25 mg Oral TID Mariel Craft, MD   25 mg at 03/12/19 1156  . magnesium hydroxide (MILK OF MAGNESIA) suspension 30 mL  30 mL Oral Daily PRN Mariel Craft, MD      . metoprolol tartrate (LOPRESSOR) tablet 12.5 mg  12.5 mg Oral BID Mariel Craft, MD   12.5 mg at 03/12/19 6962  . multivitamin with minerals tablet  1 tablet  1 tablet Oral Daily Mariel Craft, MD   1 tablet at 03/12/19 0820  . OLANZapine (ZYPREXA) tablet 15 mg  15 mg Oral QHS Mariel Craft, MD   15 mg at 03/11/19 2153  . sertraline (ZOLOFT) tablet 100 mg  100 mg Oral Daily Mariel Craft, MD   100 mg at 03/12/19 9528   PTA Medications: Medications Prior to Admission  Medication Sig Dispense Refill Last Dose  . albuterol (PROAIR HFA) 108 (90 Base) MCG/ACT inhaler Inhale 1 puff into the lungs every 6 (six) hours as needed for wheezing or shortness of breath. 1 Inhaler 1 prn at prn  . aspirin EC 81 MG tablet Take 1 tablet (81 mg total) by mouth daily. 30 tablet 1 03/07/2019 at 0800  . cholecalciferol (VITAMIN D) 25 MCG (1000 UT) tablet Take 2 tablets (2,000 Units total) by mouth daily. 60 tablet 1 03/07/2019 at 0800  . famotidine (PEPCID) 10 MG tablet Take 1 tablet (10 mg total) by mouth 2 (two) times daily. 60 tablet 1 03/07/2019 at 2000  . latanoprost (XALATAN) 0.005 % ophthalmic solution Place 1 drop into both eyes at bedtime. 2.5 mL 12 03/07/2019 at  2000  . levETIRAcetam (KEPPRA) 500 MG tablet Take 2 tablets (1,000 mg total) by mouth 2 (two) times daily. 120 tablet 1 03/07/2019 at 2000  . LORazepam (ATIVAN) 0.5 MG tablet Take 0.5 mg by mouth 2 (two) times daily.   03/07/2019 at 2000  . LORazepam (ATIVAN) 1 MG tablet Take 1 mg by mouth every 6 (six) hours as needed for anxiety.   prn at prn  . loxapine (LOXITANE) 25 MG capsule Take 1 capsule (25 mg total) by mouth 3 (three) times daily. 90 capsule 1 03/07/2019 at 2000  . metoprolol tartrate (LOPRESSOR) 25 MG tablet Take 0.5 tablets (12.5 mg total) by mouth 2 (two) times daily. 30 tablet 1 03/07/2019 at 2000  . Multiple Vitamin (MULTIVITAMIN WITH MINERALS) TABS tablet Take 1 tablet by mouth daily. 30 tablet 1 03/08/2019 at 0800  . OLANZapine (ZYPREXA) 15 MG tablet Take 1 tablet (15 mg total) by mouth at bedtime. 30 tablet 1 03/07/2019 at 2000  . sertraline (ZOLOFT) 100 MG tablet Take 1 tablet  (100 mg total) by mouth daily. 30 tablet 1 03/09/2019 at 0800    Patient Stressors: Other: "I don't like my group home"  Patient Strengths: Ability for insight Communication skills General fund of knowledge  Treatment Modalities: Medication Management, Group therapy, Case management,  1 to 1 session with clinician, Psychoeducation, Recreational therapy.   Physician Treatment Plan for Primary Diagnosis: <principal problem not specified> Long Term Goal(s):     Short Term Goals:    Medication Management: Evaluate patient's response, side effects, and tolerance of medication regimen.  Therapeutic Interventions: 1 to 1 sessions, Unit Group sessions and Medication administration.  Evaluation of Outcomes: Progressing  Physician Treatment Plan for Secondary Diagnosis: Active Problems:   Schizoaffective disorder, bipolar type (HCC)  Long Term Goal(s):     Short Term Goals:       Medication Management: Evaluate patient's response, side effects, and tolerance of medication regimen.  Therapeutic Interventions: 1 to 1 sessions, Unit Group sessions and Medication administration.  Evaluation of Outcomes: Progressing   RN Treatment Plan for Primary Diagnosis: <principal problem not specified> Long Term Goal(s): Knowledge of disease and therapeutic regimen to maintain health will improve  Short Term Goals: Ability to participate in decision making will improve, Ability to verbalize feelings will improve, Ability to disclose and discuss suicidal ideas, Ability to identify and develop effective coping behaviors will improve and Compliance with prescribed medications will improve  Medication Management: RN will administer medications as ordered by provider, will assess and evaluate patient's response and provide education to patient for prescribed medication. RN will report any adverse and/or side effects to prescribing provider.  Therapeutic Interventions: 1 on 1 counseling sessions,  Psychoeducation, Medication administration, Evaluate responses to treatment, Monitor vital signs and CBGs as ordered, Perform/monitor CIWA, COWS, AIMS and Fall Risk screenings as ordered, Perform wound care treatments as ordered.  Evaluation of Outcomes: Progressing   LCSW Treatment Plan for Primary Diagnosis: <principal problem not specified> Long Term Goal(s): Safe transition to appropriate next level of care at discharge, Engage patient in therapeutic group addressing interpersonal concerns.  Short Term Goals: Engage patient in aftercare planning with referrals and resources  Therapeutic Interventions: Assess for all discharge needs, 1 to 1 time with Social worker, Explore available resources and support systems, Assess for adequacy in community support network, Educate family and significant other(s) on suicide prevention, Complete Psychosocial Assessment, Interpersonal group therapy.  Evaluation of Outcomes: Progressing   Progress in Treatment: Attending groups: No.  Participating in groups: No. Taking medication as prescribed: Yes. Toleration medication: Yes. Family/Significant other contact made: Yes, individual(s) contacted:  Benjiman CoreSheila Marshall, legal guardian Patient understands diagnosis: No. Discussing patient identified problems/goals with staff: Yes. Medical problems stabilized or resolved: Yes. Denies suicidal/homicidal ideation: Yes. Issues/concerns per patient self-inventory: No. Other: NA  New problem(s) identified: No, Describe:  none reported  New Short Term/Long Term Goal(s): Take medication as prescribed, resume outpatient treatment with [facility], develop and practice healthy coping methods  Patient Goals:  "Move to another place"  Discharge Plan or Barriers: Pt will resume service with [facility]  Reason for Continuation of Hospitalization: Medication stabilization  Estimated Length of Stay: D/C on 03/15/2019   Recreational Therapy: Patient  Stressors: Group home Patient Goal: Patient will engage in groups without prompting or encouragement from LRT x3 group sessions within 5 recreation therapy group sessions  Attendees: Patient:Jason Patterson 03/12/2019 3:18 PM  Physician: [name] Clapacs 03/12/2019 3:18 PM  Nursing:  03/12/2019 3:18 PM  RN Care Manager: 03/12/2019 3:18 PM  Social Worker: Lowella Dandyarren Livingston Olivia Moton RunnemedeMichaela Stanfield 03/12/2019 3:18 PM  Recreational Therapist: Garret ReddishShay Lauri Purdum 03/12/2019 3:18 PM  Other:  03/12/2019 3:18 PM  Other:  03/12/2019 3:18 PM  Other: 03/12/2019 3:18 PM    Scribe for Treatment Team: Suzan SlickARREN T LIVINGSTON, LCSW 03/12/2019 3:18 PM

## 2019-03-12 NOTE — Plan of Care (Signed)
Patient is responding appropriately to assessments and denies any SI/HI/AVH but endorsing depression and anxiety at 6/10 scale, support and encouragement is provided and encouraged to engage in group scheduled activities, patient is in his room most of the shift, contract for safety of self and others, medication compliant no other physical complain and only requiring 15 minutes safety rounding no distress noted.   Problem: Education: Goal: Knowledge of [facility] Education information/materials will improve Outcome: Progressing Goal: Emotional status will improve Outcome: Progressing Goal: Mental status will improve Outcome: Progressing   Problem: Health Behavior/Discharge Planning: Goal: Compliance with treatment plan for underlying cause of condition will improve Outcome: Progressing   Problem: Safety: Goal: Periods of time without injury will increase Outcome: Progressing   Problem: Medication: Goal: Compliance with prescribed medication regimen will improve Outcome: Progressing   Problem: Self-Concept: Goal: Ability to disclose and discuss suicidal ideas will improve Outcome: Progressing   Problem: Coping: Goal: Ability to identify and develop effective coping behavior will improve Outcome: Progressing

## 2019-03-12 NOTE — Progress Notes (Signed)
Recreation Therapy Notes  Date: 03/12/2019  Time: 9:30 am   Location: Craft room   Behavioral response: N/A   Intervention Topic: Happiness  Discussion/Intervention: Patient did not attend group.   Clinical Observations/Feedback:  Patient did not attend group.   Eudelia Hiltunen LRT/CTRS        Jason Patterson 03/12/2019 10:59 AM 

## 2019-03-12 NOTE — [place] FL2 (Signed)
[place] MEDICAID FL2 LEVEL OF CARE SCREENING TOOL     IDENTIFICATION  Patient Name: Jason Patterson Birthdate: [birth year] Sex: male Admission Date (Current Location): 03/11/2019  [place]  [place] [id number] [facility]  [facility], [address], [place], [place] [address]      Provider Number: [id number]  Attending Physician Name and Address:  Audery Amellapacs, John T, MD  Relative Name and Phone Number:  Allegra GranaShelia Marshall, legal guardian [phone]    Current Level of Care: Hospital Recommended Level of Care: Family Care Home Prior Approval Number:    Date Approved/Denied:   PASRR Number:    Discharge Plan: Other (Comment)(Family care home)    Current Diagnoses: Patient Active Problem List   Diagnosis Date Noted  . MDD (major depressive disorder), recurrent severe, without psychosis (HCC) 02/10/2019  . 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 Respiratory distress 08/28/2018  . Hyperglycemia 08/28/2018  . Hyponatremia 08/28/2018  . Acute kidney injury superimposed on chronic kidney disease (HCC) 08/28/2018  . Rhabdomyolysis 08/28/2018  . Elevated transaminase level 08/28/2018  . Seizure (HCC) 08/27/2018  . Suicidal ideation   . Gastritis 03/25/2015  . Esophagitis determined by endoscopy 03/25/2015  . Seizures (HCC) 03/24/2015  . Coffee ground emesis   . Syncope 03/23/2015  . Fall 02/16/2015  . Traumatic pneumothorax 02/15/2015  . Rib fractures 02/15/2015  . Sepsis due to urinary tract infection (HCC) 02/03/2015  . Altered mental status 02/03/2015  . SIRS (systemic inflammatory response syndrome) (HCC) 02/03/2015  . Protein-calorie malnutrition, severe (HCC) 02/03/2015  . UTI (lower urinary tract infection)   . Ulnar neuropathy at elbow of left upper extremity 06/23/2014  . Schizoaffective disorder (HCC) 05/02/2014  . Chest pain 05/01/2014  . Major  depression, chronic 04/26/2014  . Schizoaffective disorder, bipolar type (HCC) 04/26/2014  . Schizoaffective disorder, unspecified type (HCC) 05/01/2009  . Bipolar disorder (HCC) 05/01/2009  . Generalized anxiety disorder 05/01/2009  . Essential hypertension 05/01/2009  . RIGHT BUNDLE BRANCH BLOCK 05/01/2009  . COPD (chronic obstructive pulmonary disease) (HCC) 05/01/2009  . GERD 05/01/2009  . PSORIASIS 05/01/2009  . Convulsions (HCC) 05/01/2009  . CHEST PAIN UNSPECIFIED 05/01/2009    Orientation RESPIRATION BLADDER Height & Weight     Self, Time, Place  Normal Continent Weight: 161 lb (73 kg) Height:  5\' 7"  (170.2 cm)  BEHAVIORAL SYMPTOMS/MOOD NEUROLOGICAL BOWEL NUTRITION STATUS  (None reported) (None reported) Continent (Regular diet)  AMBULATORY STATUS COMMUNICATION OF NEEDS Skin   Independent Verbally Normal                       Personal Care Assistance Level of Assistance  (None reported)           Functional Limitations Info  (None reported)          SPECIAL CARE FACTORS FREQUENCY  (None reported)                    Contractures Contractures Info: Not present    Additional Factors Info  Code Status(Full code) Code Status Info: N/A             Current Medications (03/12/2019):  This is the current hospital active medication list Current Facility-Administered Medications  Medication Dose Route Frequency Provider Last Rate Last Dose  . acetaminophen (TYLENOL) tablet 650 mg  650 mg Oral Q6H PRN Mariel CraftMaurer, Sheila M, MD      . albuterol (VENTOLIN HFA)  108 (90 Base) MCG/ACT inhaler 1 puff  1 puff Inhalation Q6H PRN Mariel Craft, MD      . alum & mag hydroxide-simeth (MAALOX/MYLANTA) 200-200-20 MG/5ML suspension 30 mL  30 mL Oral Q4H PRN Mariel Craft, MD      . aspirin EC tablet 81 mg  81 mg Oral Daily Mariel Craft, MD   81 mg at 03/12/19 8882  . cholecalciferol (VITAMIN D) tablet 2,000 Units  2,000 Units Oral Daily Mariel Craft, MD    2,000 Units at 03/12/19 8003  . famotidine (PEPCID) tablet 10 mg  10 mg Oral BID Mariel Craft, MD   10 mg at 03/12/19 0820  . latanoprost (XALATAN) 0.005 % ophthalmic solution 1 drop  1 drop Both Eyes QHS Mariel Craft, MD   1 drop at 03/11/19 2218  . levETIRAcetam (KEPPRA) tablet 1,000 mg  1,000 mg Oral BID Mariel Craft, MD   1,000 mg at 03/12/19 4917  . OLANZapine zydis (ZYPREXA) disintegrating tablet 10 mg  10 mg Oral Q8H PRN Mariel Craft, MD       And  . LORazepam (ATIVAN) tablet 1 mg  1 mg Oral PRN Mariel Craft, MD       And  . ziprasidone (GEODON) injection 20 mg  20 mg Intramuscular PRN Mariel Craft, MD      . loxapine Ocie Bob) capsule 25 mg  25 mg Oral TID Mariel Craft, MD   25 mg at 03/12/19 1156  . magnesium hydroxide (MILK OF MAGNESIA) suspension 30 mL  30 mL Oral Daily PRN Mariel Craft, MD      . metoprolol tartrate (LOPRESSOR) tablet 12.5 mg  12.5 mg Oral BID Mariel Craft, MD   12.5 mg at 03/12/19 9150  . multivitamin with minerals tablet 1 tablet  1 tablet Oral Daily Mariel Craft, MD   1 tablet at 03/12/19 0820  . OLANZapine (ZYPREXA) tablet 15 mg  15 mg Oral QHS Mariel Craft, MD   15 mg at 03/11/19 2153  . sertraline (ZOLOFT) tablet 100 mg  100 mg Oral Daily Mariel Craft, MD   100 mg at 03/12/19 5697     Discharge Medications: Please see discharge summary for a list of discharge medications.  Relevant Imaging Results:  Relevant Lab Results:   Additional Information N/A  Suzan Slick, LCSW

## 2019-03-13 MED ORDER — SERTRALINE HCL 25 MG PO TABS
150.0000 mg | ORAL_TABLET | Freq: Every day | ORAL | Status: DC
  Administered 2019-03-14 – 2019-03-15 (×2): 150 mg via ORAL
  Filled 2019-03-13 (×2): qty 2

## 2019-03-13 MED ORDER — OLANZAPINE 10 MG PO TABS
20.0000 mg | ORAL_TABLET | Freq: Every day | ORAL | Status: DC
  Administered 2019-03-13 – 2019-03-14 (×2): 20 mg via ORAL
  Filled 2019-03-13 (×2): qty 2

## 2019-03-13 NOTE — Plan of Care (Signed)
Patient states that he slept poor last night without the use of an sleep aid. Patient encouraged to ask for an sleep aid for the nurse in the event that he can not sleep tonight. Reports that his appetite is good with normal energy level. Rates his depression and hopelessness a 0 but endorses some anxiety and rates it an 8. PRN Vistaril administered with effectiveness. Patient observed in the milieu  ambulating with a steady gait. Milieu remains safe with q 15 minute safety checks.
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
## 2019-03-13 NOTE — Plan of Care (Signed)
Patient contract for safety, as he continue to stay in his room for too long and only comes out for food and medicine, poor socialization and interaction is  minimal, endorsing anxiety and depression at 8/10 scale, patient said that he is scared of talking to people and that why I stay in my room a lot. support and encouragement is provided , no signs of hallucinations or delusions and no further complains, sleep is continuous with out any interruptions . And only requiring 15 minutes rounding for safety. No distress.   Problem: Education: Goal: Knowledge of [facility] Education information/materials will improve Outcome: Progressing Goal: Emotional status will improve Outcome: Progressing Goal: Mental status will improve Outcome: Progressing   Problem: Health Behavior/Discharge Planning: Goal: Compliance with treatment plan for underlying cause of condition will improve Outcome: Progressing   Problem: Safety: Goal: Periods of time without injury will increase Outcome: Progressing   Problem: Medication: Goal: Compliance with prescribed medication regimen will improve Outcome: Progressing   Problem: Self-Concept: Goal: Ability to disclose and discuss suicidal ideas will improve Outcome: Progressing   Problem: Coping: Goal: Ability to identify and develop effective coping behavior will improve Outcome: Progressing

## 2019-03-13 NOTE — [facility] Group Notes (Signed)
LCSW Group Therapy Note  03/13/2019 1:15pm  Type of Therapy and Topic:  Group Therapy:  Cognitive Distortions  Participation Level:  Active   Description of Group:    Patients in this group will be introduced to the topic of cognitive distortions.  Patients will identify and describe cognitive distortions, describe the feelings these distortions create for them.  Patients will identify one or more situations in their personal life where they have cognitively distorted thinking and will verbalize challenging this cognitive distortion through positive thinking skills.  Patients will practice the skill of using positive affirmations to challenge cognitive distortions using affirmation cards.    Therapeutic Goals:  1. Patient will identify two or more cognitive distortions they have used 2. Patient will identify one or more emotions that stem from use of a cognitive distortion 3. Patient will demonstrate use of a positive affirmation to counter a cognitive distortion through discussion and/or role play. 4. Patient will describe one way cognitive distortions can be detrimental to wellness   Summary of Patient Progress:  The patient reported that he  feels "okay." Patients were introduced to the topic of cognitive distortions. The patient was able to identify and describe cognitive distortions, described the feelings these distortions create for him. Patient identified a situation in his personal life where he has cognitively distorted thinking and was able to verbalize and challenged this cognitive distortion through positive thinking skills. Patient was able to provide support and validation to other group members.    Therapeutic Modalities:   Cognitive Behavioral Therapy Motivational Interviewing   Jason Patterson  CUEBAS-COLON, LCSW 03/13/2019 3:04 PM

## 2019-03-14 MED ORDER — SERTRALINE HCL 50 MG PO TABS: 150.0000 mg | ORAL_TABLET | Freq: Every day | ORAL | 1 refills | Status: DC

## 2019-03-14 MED ORDER — OLANZAPINE 20 MG PO TABS: 20.0000 mg | ORAL_TABLET | Freq: Every day | ORAL | 1 refills | Status: AC

## 2019-03-14 NOTE — [facility] Group Notes (Signed)
LCSW Group Therapy Note 03/14/2019 1:15pm  Type of Therapy and Topic: Group Therapy: Feelings Around Returning Home & Establishing a Supportive Framework and Supporting Oneself When Supports Not Available  Participation Level: Active  Description of Group:  Patients first processed thoughts and feelings about upcoming discharge. These included fears of upcoming changes, lack of change, new living environments, judgements and expectations from others and overall stigma of mental health issues. The group then discussed the definition of a supportive framework, what that looks and feels like, and how do to discern it from an unhealthy non-supportive network. The group identified different types of supports as well as what to do when your family/friends are less than helpful or unavailable  Therapeutic Goals  1. Patient will identify one healthy supportive network that they can use at discharge. 2. Patient will identify one factor of a supportive framework and how to tell it from an unhealthy network. 3. Patient able to identify one coping skill to use when they do not have positive supports from others. 4. Patient will demonstrate ability to communicate their needs through discussion and/or role plays.  Summary of Patient Progress:  The patient reported he feels "alright." Pt engaged during group session. As patients processed their anxiety about discharge and described healthy supports patient shared he is not sure if he is ready to be discharge. He stated, "I want to move out of the group home." He reports he does not have a support system.  Patients identified at least one self-care tool they were willing to use after discharge; reading and drawing.   Therapeutic Modalities Cognitive Behavioral Therapy Motivational Interviewing   Jason Patterson  CUEBAS-COLON, LCSW 03/14/2019 11:48 AM

## 2019-03-14 NOTE — [facility] Suicide Risk Assessment (Signed)
[facility] Discharge Suicide Risk Assessment   Principal Problem: Schizoaffective disorder, bipolar type [facility]) Discharge Diagnoses: Principal Problem:   Schizoaffective disorder, bipolar type (HCC) Active Problems:   Generalized anxiety disorder   Essential hypertension   Total Time spent with patient: 45 minutes  Musculoskeletal: Strength & Muscle Tone: within normal limits Gait & Station: normal Patient leans: N/A  Psychiatric Specialty Exam: Review of Systems  Constitutional: Negative.   HENT: Negative.   Eyes: Negative.   Respiratory: Negative.   Cardiovascular: Negative.   Gastrointestinal: Negative.   Musculoskeletal: Negative.   Skin: Negative.   Neurological: Negative.   Psychiatric/Behavioral: Negative for depression, hallucinations, substance abuse and suicidal ideas. The patient is nervous/anxious.     Blood pressure 100/81, pulse 92, temperature 98.7 F (37.1 C), temperature source Oral, resp. rate 16, height 5\' 7"  (1.702 m), weight 73 kg, SpO2 98 %.Body mass index is 25.22 kg/m.  General Appearance: Fairly Groomed  Patent attorney::  Fair  Speech:  [phone]  Volume:  Decreased  Mood:  Euthymic  Affect:  Congruent  Thought Process:  Goal Directed  Orientation:  Full (Time, Place, and Person)  Thought Content:  Illogical and Rumination  Suicidal Thoughts:  No  Homicidal Thoughts:  No  Memory:  Immediate;   Fair Recent;   Fair Remote;   Fair  Judgement:  Fair  Insight:  Fair  Psychomotor Activity:  Decreased  Concentration:  Fair  Recall:  [employer] of Knowledge:Fair  Language: Fair  Akathisia:  No  Handed:  Right  AIMS (if indicated):     Assets:  Desire for Improvement Housing Physical Health Resilience  Sleep:  Number of Hours: 8.25  Cognition: WNL  ADL's:  Intact   Mental Status Per Nursing Assessment::   On Admission:  NA(Denies)  Demographic Factors:  Male, Caucasian and Unemployed  Loss Factors: Financial problems/change in socioeconomic  status  Historical Factors: Impulsivity  Risk Reduction Factors:   Religious beliefs about death, Living with another person, especially a relative, Positive social support and Positive therapeutic relationship  Continued Clinical Symptoms:  Depression:   Anhedonia Schizophrenia:   Paranoid or undifferentiated type  Cognitive Features That Contribute To Risk:  Closed-mindedness    Suicide Risk:  Minimal: No identifiable suicidal ideation.  Patients presenting with no risk factors but with morbid ruminations; may be classified as minimal risk based on the severity of the depressive symptoms    Plan Of Care/Follow-up recommendations:  Activity:  Activity as tolerated Diet:  Regular diet Other:  Follow-up with outpatient medication management and ECT treatment  [name], MD 03/14/2019, 1:47 PM
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
## 2019-03-14 NOTE — Plan of Care (Signed)
Patient is alert and oriented to person, place and situation. Continues to report that he slept poor last night without the use of an sleep aid. Patient re-educated on sleep medication that is available. Appetite is fair, energy level is low with poor concentration. Rates his depression an 8, and rates his anxiety and hopelessness a 7. Denies having  pain, SI/HI or hallucinations. Milieu remains safe with q 15 minute safety checks. Will continue to monitor.

## 2019-03-15 NOTE — Plan of Care (Signed)
Pt. Is complaint with medications. Pt. Able to discuss suicidal ideas and feelings. Pt. Endorses chronic passive suicidal thoughts, able to contract for safety on the unit. Pt. Monitored for safety on the unit. Pt. Endorses a depressed and anxious mood that has not improved since admissions he states. Pt. Reports he is anxious and depressed about his living situation.     Problem: Education: Goal: Emotional status will improve Outcome: Not Progressing Goal: Mental status will improve Outcome: Not Progressing   Problem: Health Behavior/Discharge Planning: Goal: Compliance with treatment plan for underlying cause of condition will improve Outcome: Progressing   Problem: Safety: Goal: Periods of time without injury will increase Outcome: Progressing   Problem: Self-Concept: Goal: Ability to disclose and discuss suicidal ideas will improve Outcome: Progressing

## 2019-03-15 NOTE — Progress Notes (Signed)
Recreation Therapy Notes   Date: 03/15/2019  Time: 9:30 am   Location: Craft room   Behavioral response: N/A   Intervention Topic: Anger Management  Discussion/Intervention: Patient did not attend group.   Clinical Observations/Feedback:  Patient did not attend group.   [name] LRT/CTRS         Jason Patterson 03/15/2019 10:40 AM 

## 2019-03-15 NOTE — Progress Notes (Signed)
Recreation Therapy Notes  INPATIENT RECREATION TR PLAN  Patient Details Name: Jason Patterson MRN: [id number] DOB: [birth year] Today's Date: 03/15/2019  Rec Therapy Plan Is patient appropriate for Therapeutic Recreation?: Yes Treatment times per week: At least 3 Estimated Length of Stay: 5-7 days TR Treatment/Interventions: Group participation (Comment)  Discharge Criteria Pt will be discharged from therapy if:: Discharged Treatment plan/goals/alternatives discussed and agreed upon by:: Patient/family  Discharge Summary Short term goals set: Patient will engage in groups without prompting or encouragement from LRT x3 group sessions within 5 recreation therapy group sessions Short term goals met: Not met Reason goals not met:  Patient did not attend any groups Therapeutic equipment acquired: N/A Reason patient discharged from therapy: Discharge from hospital Pt/family agrees with progress & goals achieved: Yes Date patient discharged from therapy: 03/15/19   Tabetha Haraway 03/15/2019, 11:05 AM

## 2019-03-15 NOTE — Progress Notes (Signed)
Patient alert and oriented x 4. Ambulates unit with steady gait. Verbally denies SI/HI/AVH and pain. Patient discharged on above date and time. Verbalized understanding the discharge information provided to patient upon discharge. Patient departed unit with discharge paperwork, prescriptions and personal belongings. Picked up by [facility].  No distress noted.

## 2019-03-15 NOTE — Progress Notes (Signed)
D: Pt. Able to discuss suicidal ideas and feelings. Pt. Endorses chronic passive suicidal thoughts, able to contract for safety on the unit though. Pt. Monitored for safety on the unit by staff. Pt. Endorses a depressed and anxious mood that has not improved since admissions he states. Pt. Reports he is anxious and depressed about his living situation and does not want to return to his group home, and that his guardian needs to find him another place to live. Pt. Is mostly pleasant and cooperative.    A: Q x 15 minute observation checks were completed for safety. Patient was provided with education. Patient was given/offered medications per orders. Patient  was encourage to attend groups, participate in unit activities and continue with plan of care. Pt. Chart and plans of care reviewed. Pt. Given support and encouragement.   R: Patient is complaint with medications and unit procedures. Pt. Mostly isolative and withdrawn to his room this evening. Pt. Observed eating good, eats snack. Pt. covid screened. Pt. Interactions with staff and peers appropriate.             Precautionary checks every 15 minutes for safety maintained, room free of safety hazards, patient sustains no injury or falls during this shift. Will endorse care to next shift.
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
## 2019-03-15 NOTE — Progress Notes (Signed)
  [facility] Adult Case Management Discharge Plan :  Will you be returning to the same living situation after discharge:  Yes,  pt lives at [facility] At discharge, do you have transportation home?: Yes,  Group home will pick pt up at 11am Do you have the ability to pay for your medications: Yes,  insurance  Release of information consent forms completed and in the chart;  Patient's signature needed at discharge.  Patient to Follow up at: Follow-up Information    [facility] Go on 03/15/2019.   Why:  Your ACT Team will follow up with you on Monday, March 15, 2019. Thank you. Contact information: [address] [place] [address] [phone]           Next level of care provider has access to [facility] Link:no  Safety Planning and Suicide Prevention discussed: Yes,  [name], legal guardian  Have you used any form of tobacco in the last 30 days? (Cigarettes, Smokeless Tobacco, Cigars, and/or Pipes): Yes  Has patient been referred to the Quitline?: N/A patient is not a smoker  Patient has been referred for addiction treatment: N/A  Suzan Slick, LCSW 03/15/2019, 9:16 AM

## 2019-03-22 ENCOUNTER — Telehealth: Payer: Self-pay

## 2019-03-29 ENCOUNTER — Encounter: Payer: Self-pay | Admitting: Anesthesiology

## 2019-03-29 ENCOUNTER — Other Ambulatory Visit: Payer: Self-pay

## 2019-03-29 ENCOUNTER — Other Ambulatory Visit: Payer: Self-pay | Admitting: Psychiatry

## 2019-03-29 ENCOUNTER — Ambulatory Visit
Admission: RE | Admit: 2019-03-29 | Discharge: 2019-03-29 | Disposition: A | Discharge status: Home / Self Care | Payer: Medicaid Other | Source: Ambulatory Visit | Attending: Psychiatry | Admitting: Psychiatry

## 2019-03-29 DIAGNOSIS — Z888 Allergy status to other drugs, medicaments and biological substances status: Secondary | ICD-10-CM | POA: Diagnosis not present

## 2019-03-29 DIAGNOSIS — J449 Chronic obstructive pulmonary disease, unspecified: Secondary | ICD-10-CM | POA: Insufficient documentation

## 2019-03-29 DIAGNOSIS — F250 Schizoaffective disorder, bipolar type: Secondary | ICD-10-CM | POA: Diagnosis not present

## 2019-03-29 DIAGNOSIS — F251 Schizoaffective disorder, depressive type: Secondary | ICD-10-CM

## 2019-03-29 DIAGNOSIS — I10 Essential (primary) hypertension: Secondary | ICD-10-CM | POA: Insufficient documentation

## 2019-03-29 DIAGNOSIS — F411 Generalized anxiety disorder: Secondary | ICD-10-CM | POA: Insufficient documentation

## 2019-03-29 DIAGNOSIS — K219 Gastro-esophageal reflux disease without esophagitis: Secondary | ICD-10-CM | POA: Diagnosis not present

## 2019-03-29 DIAGNOSIS — I499 Cardiac arrhythmia, unspecified: Secondary | ICD-10-CM

## 2019-03-29 DIAGNOSIS — G40909 Epilepsy, unspecified, not intractable, without status epilepticus: Secondary | ICD-10-CM | POA: Diagnosis not present

## 2019-03-29 DIAGNOSIS — F17210 Nicotine dependence, cigarettes, uncomplicated: Secondary | ICD-10-CM | POA: Insufficient documentation

## 2019-03-29 LAB — MAGNESIUM: Magnesium: 2.1 mg/dL (ref 1.7–2.4)

## 2019-03-29 LAB — BASIC METABOLIC PANEL
Anion gap: 11 (ref 5–15)
BUN: 12 mg/dL (ref 8–23)
CO2: 20 mmol/L — ABNORMAL LOW (ref 22–32)
Calcium: 9.2 mg/dL (ref 8.9–10.3)
Chloride: 107 mmol/L (ref 98–111)
Creatinine, Ser: 0.91 mg/dL (ref 0.61–1.24)
GFR calc Af Amer: >60 mL/min (ref >60)
GFR calc non Af Amer: >60 mL/min (ref >60)
Glucose, Bld: 125 mg/dL — ABNORMAL HIGH (ref 70–99)
Potassium: 4 mmol/L (ref 3.5–5.1)
Sodium: 138 mmol/L (ref 135–145)

## 2019-03-29 MED ORDER — ONDANSETRON HCL 4 MG/2ML IJ SOLN: 4.0000 mg | Freq: Once | INTRAMUSCULAR | Status: DC | PRN

## 2019-03-29 MED ORDER — GLYCOPYRROLATE 0.2 MG/ML IJ SOLN
INTRAMUSCULAR | Status: AC
  Administered 2019-03-29: 10:00:00 0.1 mg via INTRAVENOUS
  Filled 2019-03-29: qty 1

## 2019-03-29 MED ORDER — KETOROLAC TROMETHAMINE 30 MG/ML IJ SOLN
INTRAMUSCULAR | Status: AC
  Administered 2019-03-29: 30 mg via INTRAVENOUS
  Filled 2019-03-29: qty 1

## 2019-03-29 MED ORDER — SUCCINYLCHOLINE CHLORIDE 20 MG/ML IJ SOLN
INTRAMUSCULAR | Status: AC
  Filled 2019-03-29: qty 1

## 2019-03-29 MED ORDER — KETOROLAC TROMETHAMINE 30 MG/ML IJ SOLN
30.0000 mg | Freq: Once | INTRAMUSCULAR | Status: AC
  Administered 2019-03-29: 30 mg via INTRAVENOUS

## 2019-03-29 MED ORDER — METHOHEXITAL SODIUM 0.5 G IJ SOLR
INTRAMUSCULAR | Status: AC
  Filled 2019-03-29: qty 500

## 2019-03-29 MED ORDER — SUCCINYLCHOLINE CHLORIDE 200 MG/10ML IV SOSY
PREFILLED_SYRINGE | INTRAVENOUS | Status: DC | PRN
  Administered 2019-03-29: 90 mg via INTRAVENOUS

## 2019-03-29 MED ORDER — GLYCOPYRROLATE 0.2 MG/ML IJ SOLN
0.1000 mg | Freq: Once | INTRAMUSCULAR | Status: AC
  Administered 2019-03-29: 0.1 mg via INTRAVENOUS

## 2019-03-29 MED ORDER — FENTANYL CITRATE (PF) 100 MCG/2ML IJ SOLN: 25.0000 ug | INTRAMUSCULAR | Status: DC | PRN

## 2019-03-29 MED ORDER — METHOHEXITAL SODIUM 100 MG/10ML IV SOSY
PREFILLED_SYRINGE | INTRAVENOUS | Status: DC | PRN
  Administered 2019-03-29: 70 mg via INTRAVENOUS

## 2019-03-29 MED ORDER — SODIUM CHLORIDE 0.9 % IV SOLN
500.0000 mL | Freq: Once | INTRAVENOUS | Status: AC
  Administered 2019-03-29: 09:00:00 via INTRAVENOUS

## 2019-03-29 NOTE — Anesthesia Procedure Notes (Signed)
Date/Time: 03/29/2019 10:32 AM Performed by: Lily Kocher, CRNA Pre-anesthesia Checklist: Patient identified, Emergency Drugs available, Suction available and Patient being monitored Patient Re-evaluated:Patient Re-evaluated prior to induction Oxygen Delivery Method: Circle system utilized Preoxygenation: Pre-oxygenation with 100% oxygen Induction Type: IV induction Ventilation: Mask ventilation without difficulty and Mask ventilation throughout procedure Airway Equipment and Method: Bite block Placement Confirmation: positive ETCO2 Dental Injury: Teeth and Oropharynx as per pre-operative assessment

## 2019-03-29 NOTE — Transfer of Care (Signed)
Immediate Anesthesia Transfer of Care Note  Patient: Jason Patterson  Procedure(s) Performed: ECT TX  Patient Location: PACU  Anesthesia Type:General  Level of Consciousness: sedated  Airway & Oxygen Therapy: Patient Spontanous Breathing and Patient connected to face mask oxygen  Post-op Assessment: Report given to RN and Post -op Vital signs reviewed and stable  Post vital signs: Reviewed and stable  Last Vitals:  Vitals Value Taken Time  BP 130/98 03/29/2019 10:44 AM  Temp    Pulse 67 03/29/2019 10:45 AM  Resp 27 03/29/2019 10:45 AM  SpO2 99 % 03/29/2019 10:45 AM  Vitals shown include unvalidated device data.  Last Pain:  Vitals:   03/29/19 0933  TempSrc:   PainSc: 0-No pain         Complications: No apparent anesthesia complications

## 2019-03-29 NOTE — Discharge Instructions (Signed)
1)  The drugs that you have been given will stay in your system until tomorrow so for the       next 24 hours you should not:  A. Drive an automobile  B. Make any legal decisions  C. Drink any alcoholic beverages  2)  You may resume your regular meals upon return home.  3)  A responsible adult must take you home.  Someone should stay with you for a few          hours, then be available by phone for the remainder of the treatment day.  4)  You May experience any of the following symptoms:  Headache, Nausea and a dry mouth (due to the medications you were given),  temporary memory loss and some confusion, or sore muscles (a warm bath  should help this).  If you you experience any of these symptoms let us know on                your return visit.  5)  Report any of the following: any acute discomfort, severe headache, or temperature        greater than 100.5 F.   Also report any unusual redness, swelling, drainage, or pain         at your IV site.    You may report Symptoms to:  ECT PROGRAM- [place] [facility]          Phone: [phone], ECT Department           or Dr. Shary Key office [phone]  6)  Your next ECT Treatment is Monday May 25  We will call 2 days prior to your scheduled appointment for arrival times.  7)  Nothing to eat or drink after midnight the night before your procedure.  8)  Take     With a sip of water the morning of your procedure.  9)  Other Instructions: Call [phone] to cancel the morning of your procedure due         to illness or emergency.  10) We will call within 72 hours to assess how you are feeling.
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
## 2019-03-29 NOTE — Anesthesia Postprocedure Evaluation (Signed)
Anesthesia Post Note  Patient: Jason Patterson  Procedure(s) Performed: ECT TX  Patient location during evaluation: PACU Anesthesia Type: General Level of consciousness: awake and alert Pain management: pain level controlled Vital Signs Assessment: post-procedure vital signs reviewed and stable Respiratory status: spontaneous breathing, nonlabored ventilation, respiratory function stable and patient connected to nasal cannula oxygen Cardiovascular status: blood pressure returned to baseline and stable Postop Assessment: no apparent nausea or vomiting Anesthetic complications: no     Last Vitals:  Vitals:   03/29/19 1111 03/29/19 1115  BP: 101/79 112/79  Pulse: 85 87  Resp: (!) 31 20  Temp: (!) 36.3 C   SpO2: 94% 96%    Last Pain:  Vitals:   03/29/19 1115  TempSrc:   PainSc: 0-No pain                 Lenard Simmer

## 2019-03-29 NOTE — Anesthesia Post-op Follow-up Note (Signed)
Anesthesia QCDR form completed.        
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
## 2019-03-30 NOTE — Procedures (Signed)
ECT SERVICES Physician's Interval Evaluation & Treatment Note  Patient Identification: Jason Patterson MRN:  [id number] Date of Evaluation:  03/30/2019 TX #: [id number]  MADRS:   MMSE:   P.E. Findings:  No change to physical exam  Psychiatric Interval Note:  Mood feeling anxious down depressed  Subjective:  Patient is a 63 y.o. male seen for evaluation for Electroconvulsive Therapy. Complains as usual that people hate him at his group home.  No physical complaints  Treatment Summary:   [x]   Right Unilateral             []  Bilateral   % Energy : 0.3 ms, 100%   Impedance: 2170 ohms  Seizure Energy Index: 7066 V squared  Postictal Suppression Index: 79%  Seizure Concordance Index: 96%  Medications  Pre Shock: Robinul 0.1 mg Toradol 30 mg Brevital 70 mg succinylcholine 100 mg  Post Shock:    Seizure Duration: 22 seconds EMG, 49 seconds EEG   Comments: Patient had extended runs of PVCs after treatment.  Reviewed case with anesthesia.  Will take this into account with further treatments.  See him back in 2 weeks for now.  Lungs:  [x]   Clear to auscultation               []  Other:   Heart:    [x]   Regular rhythm             []  irregular rhythm    [x]   Previous H&P reviewed, patient examined and there are NO CHANGES                 []   Previous H&P reviewed, patient examined and there are changes noted.   Jason Rasmussen, MD 5/12/20202:40 PM

## 2019-04-09 ENCOUNTER — Telehealth: Payer: Self-pay

## 2019-04-13 ENCOUNTER — Other Ambulatory Visit: Payer: Self-pay | Admitting: Psychiatry

## 2019-04-14 ENCOUNTER — Encounter: Payer: Self-pay | Admitting: Registered Nurse

## 2019-04-14 ENCOUNTER — Other Ambulatory Visit: Payer: Self-pay

## 2019-04-14 ENCOUNTER — Encounter
Admission: RE | Admit: 2019-04-14 | Discharge: 2019-04-14 | Disposition: A | Discharge status: Home / Self Care | Payer: Medicaid Other | Source: Ambulatory Visit | Attending: Psychiatry | Admitting: Psychiatry

## 2019-04-14 DIAGNOSIS — I10 Essential (primary) hypertension: Secondary | ICD-10-CM | POA: Diagnosis not present

## 2019-04-14 DIAGNOSIS — F319 Bipolar disorder, unspecified: Secondary | ICD-10-CM | POA: Diagnosis not present

## 2019-04-14 DIAGNOSIS — Z880 Allergy status to penicillin: Secondary | ICD-10-CM | POA: Insufficient documentation

## 2019-04-14 DIAGNOSIS — Z886 Allergy status to analgesic agent status: Secondary | ICD-10-CM | POA: Insufficient documentation

## 2019-04-14 DIAGNOSIS — Z883 Allergy status to other anti-infective agents status: Secondary | ICD-10-CM | POA: Insufficient documentation

## 2019-04-14 DIAGNOSIS — F411 Generalized anxiety disorder: Secondary | ICD-10-CM | POA: Diagnosis not present

## 2019-04-14 DIAGNOSIS — G40909 Epilepsy, unspecified, not intractable, without status epilepticus: Secondary | ICD-10-CM | POA: Insufficient documentation

## 2019-04-14 DIAGNOSIS — J449 Chronic obstructive pulmonary disease, unspecified: Secondary | ICD-10-CM | POA: Diagnosis not present

## 2019-04-14 DIAGNOSIS — F251 Schizoaffective disorder, depressive type: Secondary | ICD-10-CM

## 2019-04-14 DIAGNOSIS — Z888 Allergy status to other drugs, medicaments and biological substances status: Secondary | ICD-10-CM | POA: Diagnosis not present

## 2019-04-14 DIAGNOSIS — F17210 Nicotine dependence, cigarettes, uncomplicated: Secondary | ICD-10-CM | POA: Diagnosis not present

## 2019-04-14 MED ORDER — KETOROLAC TROMETHAMINE 30 MG/ML IJ SOLN
INTRAMUSCULAR | Status: AC
  Filled 2019-04-14: qty 1

## 2019-04-14 MED ORDER — SUCCINYLCHOLINE CHLORIDE 20 MG/ML IJ SOLN
INTRAMUSCULAR | Status: DC | PRN
  Administered 2019-04-14: 90 mg via INTRAVENOUS

## 2019-04-14 MED ORDER — METHOHEXITAL SODIUM 100 MG/10ML IV SOSY
PREFILLED_SYRINGE | INTRAVENOUS | Status: DC | PRN
  Administered 2019-04-14: 70 mg via INTRAVENOUS

## 2019-04-14 MED ORDER — SODIUM CHLORIDE 0.9 % IV SOLN
500.0000 mL | Freq: Once | INTRAVENOUS | Status: AC
  Administered 2019-04-14: 10:00:00 via INTRAVENOUS

## 2019-04-14 MED ORDER — GLYCOPYRROLATE 0.2 MG/ML IJ SOLN
0.1000 mg | Freq: Once | INTRAMUSCULAR | Status: AC
  Administered 2019-04-14: 0.1 mg via INTRAVENOUS

## 2019-04-14 MED ORDER — KETOROLAC TROMETHAMINE 30 MG/ML IJ SOLN
30.0000 mg | Freq: Once | INTRAMUSCULAR | Status: AC
  Administered 2019-04-14: 30 mg via INTRAVENOUS

## 2019-04-14 MED ORDER — GLYCOPYRROLATE 0.2 MG/ML IJ SOLN
INTRAMUSCULAR | Status: AC
  Administered 2019-04-14: 0.1 mg via INTRAVENOUS
  Filled 2019-04-14: qty 1

## 2019-04-14 NOTE — Anesthesia Post-op Follow-up Note (Signed)
Anesthesia QCDR form completed.        

## 2019-04-14 NOTE — Anesthesia Postprocedure Evaluation (Signed)
Anesthesia Post Note  Patient: Jason Patterson  Procedure(s) Performed: ECT TX  Patient location during evaluation: PACU Anesthesia Type: General Level of consciousness: awake and alert Pain management: pain level controlled Vital Signs Assessment: post-procedure vital signs reviewed and stable Respiratory status: spontaneous breathing, nonlabored ventilation and respiratory function stable Cardiovascular status: blood pressure returned to baseline and stable Postop Assessment: no signs of nausea or vomiting Anesthetic complications: no     Last Vitals:  Vitals:   04/14/19 1113 04/14/19 1123  BP: (!) 128/92 118/84  Pulse: 73 73  Resp: (!) 24 19  Temp:    SpO2: 100% 95%    Last Pain:  Vitals:   04/14/19 1123  TempSrc:   PainSc: 0-No pain                 Jory Tanguma

## 2019-04-14 NOTE — Anesthesia Procedure Notes (Signed)
Date/Time: 04/14/2019 10:54 AM Performed by: Karoline Caldwell, CRNA Pre-anesthesia Checklist: Patient identified, Emergency Drugs available, Suction available and Patient being monitored Patient Re-evaluated:Patient Re-evaluated prior to induction Oxygen Delivery Method: Circle system utilized Preoxygenation: Pre-oxygenation with 100% oxygen Induction Type: IV induction Ventilation: Mask ventilation without difficulty and Mask ventilation throughout procedure Airway Equipment and Method: Bite block Placement Confirmation: positive ETCO2 Dental Injury: Teeth and Oropharynx as per pre-operative assessment

## 2019-04-14 NOTE — Transfer of Care (Signed)
Immediate Anesthesia Transfer of Care Note  Patient: Jason Patterson  Procedure(s) Performed: ECT TX  Patient Location: PACU  Anesthesia Type:General  Level of Consciousness: sedated  Airway & Oxygen Therapy: Patient Spontanous Breathing and Patient connected to face mask oxygen  Post-op Assessment: Report given to RN and Post -op Vital signs reviewed and stable  Post vital signs: Reviewed and stable  Last Vitals:  Vitals Value Taken Time  BP 145/85 04/14/2019 11:03 AM  Temp 36.6 C 04/14/2019 11:03 AM  Pulse 57 04/14/2019 11:04 AM  Resp 23 04/14/2019 11:04 AM  SpO2 100 % 04/14/2019 11:04 AM  Vitals shown include unvalidated device data.  Last Pain:  Vitals:   04/14/19 0926  TempSrc:   PainSc: 0-No pain         Complications: No apparent anesthesia complications
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
## 2019-04-14 NOTE — Discharge Instructions (Signed)
1)  The drugs that you have been given will stay in your system until tomorrow so for the       next 24 hours you should not: ° A. Drive an automobile ° B. Make any legal decisions ° C. Drink any alcoholic beverages ° °2)  You may resume your regular meals upon return home. ° °3)  A responsible adult must take you home.  Someone should stay with you for a few          hours, then be available by phone for the remainder of the treatment day. ° °4)  You May experience any of the following symptoms: ° Headache, Nausea and a dry mouth (due to the medications you were given),  temporary memory loss and some confusion, or sore muscles (a warm bath  should help this).  If you you experience any of these symptoms let us know on                your return visit. ° °5)  Report any of the following: any acute discomfort, severe headache, or temperature        greater than 100.5 F.   Also report any unusual redness, swelling, drainage, or pain         at your IV site. ° °  You may report Symptoms to:  ECT PROGRAM- [place] at [facility] °         Phone: [phone], ECT Department  °         or Dr. Clapac's office [phone] ° °6)  Your next ECT Treatment is Wednesday June 17 ° We will call 2 days prior to your scheduled appointment for arrival times. ° °7)  Nothing to eat or drink after midnight the night before your procedure. ° °8)  Take   With a sip of water the morning of your procedure. ° °9)  Other Instructions: Call [phone] to cancel the morning of your procedure due         to illness or emergency. ° °10) We will call within 72 hours to assess how you are feeling.  °

## 2019-04-14 NOTE — Anesthesia Preprocedure Evaluation (Signed)
Anesthesia Evaluation  Patient identified by MRN, date of birth, ID band Patient awake    Reviewed: Allergy & Precautions, NPO status , Patient's Chart, lab work & pertinent test results  History of Anesthesia Complications Negative for: history of anesthetic complications  Airway Mallampati: III  TM Distance: >3 FB Neck ROM: Full    Dental  (+) Poor Dentition   Pulmonary COPD,  COPD inhaler, Current Smoker,    breath sounds clear to auscultation- rhonchi (-) wheezing      Cardiovascular hypertension, Pt. on medications (-) CAD, (-) Past MI, (-) Cardiac Stents and (-) CABG Dysrhythmias: RBBB.  Rhythm:Regular Rate:Normal - Systolic murmurs and - Diastolic murmurs    Neuro/Psych  Headaches, Seizures -, Well Controlled,  PSYCHIATRIC DISORDERS Anxiety Depression Bipolar Disorder Schizophrenia    GI/Hepatic Neg liver ROS, GERD  ,  Endo/Other  negative endocrine ROSneg diabetes  Renal/GU negative Renal ROS     Musculoskeletal negative musculoskeletal ROS (+)   Abdominal (+) - obese,   Peds  Hematology negative hematology ROS (+)   Anesthesia Other Findings Past Medical History: No date: Anxiety No date: Bipolar disorder, unspecified (HCC) No date: Coarse tremors No date: COPD (chronic obstructive pulmonary disease) (HCC) No date: Depression No date: Diastolic dysfunction No date: Dysrhythmia No date: Essential hypertension, benign No date: Generalized anxiety disorder No date: GERD (gastroesophageal reflux disease) No date: Headache(784.0) No date: Physical deconditioning No date: Psoriasis No date: Rhabdomyolysis No date: Schizoaffective disorder, unspecified condition No date: Seizure disorder (HCC) No date: Seizures (HCC)     Comment:  NONE SINCE AGE 63 No date: Sepsis (HCC) No date: Shortness of breath     Comment:  W/ EXERTION    Reproductive/Obstetrics                              Anesthesia Physical  Anesthesia Plan  ASA: III  Anesthesia Plan: General   Post-op Pain Management:    Induction: Intravenous  PONV Risk Score and Plan: 0  Airway Management Planned: Mask  Additional Equipment:   Intra-op Plan:   Post-operative Plan:   Informed Consent: I have reviewed the patients History and Physical, chart, labs and discussed the procedure including the risks, benefits and alternatives for the proposed anesthesia with the patient or authorized representative who has indicated his/her understanding and acceptance.     Dental advisory given  Plan Discussed with: CRNA and Anesthesiologist  Anesthesia Plan Comments:         Anesthesia Quick Evaluation  

## 2019-04-14 NOTE — Procedures (Signed)
ECT SERVICES Physician's Interval Evaluation & Treatment Note  Patient Identification: Jason Patterson MRN:  [id number] Date of Evaluation:  04/14/2019 TX #: 15  MADRS:   MMSE:   P.E. Findings:  No change to physical exam.  Heart and lungs unremarkable  Psychiatric Interval Note:  Patient complaining about his group home as usual.  Lucid however.  Interactive affect euthymic not reporting suicidal thought.  Subjective:  Patient is a 63 y.o. male seen for evaluation for Electroconvulsive Therapy. Seems to be feeling a little better than last time we spoke  Treatment Summary:   [x]   Right Unilateral             []  Bilateral   % Energy : 0.3 ms 100%   Impedance: 1580 ohms  Seizure Energy Index: 4479 V squared  Postictal Suppression Index: 90%  Seizure Concordance Index: 92%  Medications  Pre Shock: Robinul 0.1 mg Toradol 30 mg Brevital 70 mg succinylcholine 100 mg  Post Shock:    Seizure Duration: 21 seconds EMG 44 seconds EEG   Comments: Follow-up in 3 weeks.  Lungs:  [x]   Clear to auscultation               []  Other:   Heart:    [x]   Regular rhythm             []  irregular rhythm    [x]   Previous H&P reviewed, patient examined and there are NO CHANGES                 []   Previous H&P reviewed, patient examined and there are changes noted.   [name], MD 5/27/20204:22 PM

## 2019-04-30 ENCOUNTER — Telehealth: Payer: Self-pay

## 2019-05-03 ENCOUNTER — Telehealth: Payer: Self-pay | Admitting: *Deleted

## 2019-05-07 ENCOUNTER — Telehealth: Payer: Self-pay | Admitting: *Deleted

## 2019-05-09 ENCOUNTER — Other Ambulatory Visit: Payer: Self-pay | Admitting: Psychiatry

## 2019-05-10 ENCOUNTER — Other Ambulatory Visit: Payer: Self-pay

## 2019-05-10 ENCOUNTER — Encounter
Admission: RE | Admit: 2019-05-10 | Discharge: 2019-05-10 | Disposition: A | Discharge status: Home / Self Care | Payer: Medicaid Other | Source: Ambulatory Visit | Attending: Psychiatry | Admitting: Psychiatry

## 2019-05-10 ENCOUNTER — Encounter: Payer: Self-pay | Admitting: Certified Registered Nurse Anesthetist

## 2019-05-10 ENCOUNTER — Other Ambulatory Visit
Admission: RE | Admit: 2019-05-10 | Discharge: 2019-05-10 | Disposition: A | Discharge status: Home / Self Care | Payer: Medicaid Other | Source: Ambulatory Visit | Attending: Psychiatry | Admitting: Psychiatry

## 2019-05-10 ENCOUNTER — Ambulatory Visit: Payer: Self-pay | Admitting: Certified Registered Nurse Anesthetist

## 2019-05-10 DIAGNOSIS — J449 Chronic obstructive pulmonary disease, unspecified: Secondary | ICD-10-CM | POA: Insufficient documentation

## 2019-05-10 DIAGNOSIS — R45851 Suicidal ideations: Secondary | ICD-10-CM | POA: Insufficient documentation

## 2019-05-10 DIAGNOSIS — R4587 Impulsiveness: Secondary | ICD-10-CM | POA: Insufficient documentation

## 2019-05-10 DIAGNOSIS — F332 Major depressive disorder, recurrent severe without psychotic features: Secondary | ICD-10-CM | POA: Diagnosis not present

## 2019-05-10 DIAGNOSIS — F259 Schizoaffective disorder, unspecified: Secondary | ICD-10-CM | POA: Diagnosis not present

## 2019-05-10 DIAGNOSIS — I10 Essential (primary) hypertension: Secondary | ICD-10-CM | POA: Insufficient documentation

## 2019-05-10 DIAGNOSIS — Z1159 Encounter for screening for other viral diseases: Secondary | ICD-10-CM | POA: Insufficient documentation

## 2019-05-10 DIAGNOSIS — F319 Bipolar disorder, unspecified: Secondary | ICD-10-CM | POA: Insufficient documentation

## 2019-05-10 DIAGNOSIS — F17210 Nicotine dependence, cigarettes, uncomplicated: Secondary | ICD-10-CM | POA: Insufficient documentation

## 2019-05-10 LAB — SARS CORONAVIRUS 2 BY RT PCR (HOSPITAL ORDER, PERFORMED IN ~~LOC~~ HOSPITAL LAB): SARS Coronavirus 2: NEGATIVE

## 2019-05-10 MED ORDER — SODIUM CHLORIDE 0.9 % IV SOLN
500.0000 mL | Freq: Once | INTRAVENOUS | Status: AC
  Administered 2019-05-10: 500 mL via INTRAVENOUS

## 2019-05-10 MED ORDER — SODIUM CHLORIDE 0.9 % IV SOLN
INTRAVENOUS | Status: DC | PRN
  Administered 2019-05-10: 11:00:00 via INTRAVENOUS

## 2019-05-10 MED ORDER — SODIUM CHLORIDE FLUSH 0.9 % IV SOLN
INTRAVENOUS | Status: AC
  Filled 2019-05-10: qty 10

## 2019-05-10 MED ORDER — SUCCINYLCHOLINE CHLORIDE 20 MG/ML IJ SOLN
INTRAMUSCULAR | Status: AC
  Filled 2019-05-10: qty 1

## 2019-05-10 MED ORDER — ONDANSETRON HCL 4 MG/2ML IJ SOLN: 4.0000 mg | Freq: Once | INTRAMUSCULAR | Status: DC | PRN

## 2019-05-10 MED ORDER — SUCCINYLCHOLINE CHLORIDE 20 MG/ML IJ SOLN
INTRAMUSCULAR | Status: DC | PRN
  Administered 2019-05-10: 100 mg via INTRAVENOUS

## 2019-05-10 MED ORDER — METHOHEXITAL SODIUM 100 MG/10ML IV SOSY
PREFILLED_SYRINGE | INTRAVENOUS | Status: DC | PRN
  Administered 2019-05-10: 70 mg via INTRAVENOUS

## 2019-05-10 MED ORDER — KETOROLAC TROMETHAMINE 30 MG/ML IJ SOLN
30.0000 mg | Freq: Once | INTRAMUSCULAR | Status: AC
  Administered 2019-05-10: 30 mg via INTRAVENOUS

## 2019-05-10 MED ORDER — FENTANYL CITRATE (PF) 100 MCG/2ML IJ SOLN: 25.0000 ug | INTRAMUSCULAR | Status: DC | PRN

## 2019-05-10 MED ORDER — KETOROLAC TROMETHAMINE 30 MG/ML IJ SOLN
INTRAMUSCULAR | Status: AC
  Administered 2019-05-10: 30 mg via INTRAVENOUS
  Filled 2019-05-10: qty 1

## 2019-05-10 MED ORDER — GLYCOPYRROLATE 0.2 MG/ML IJ SOLN
INTRAMUSCULAR | Status: AC
  Administered 2019-05-10: 0.1 mg via INTRAVENOUS
  Filled 2019-05-10: qty 1

## 2019-05-10 MED ORDER — GLYCOPYRROLATE 0.2 MG/ML IJ SOLN
0.1000 mg | Freq: Once | INTRAMUSCULAR | Status: AC
  Administered 2019-05-10: 0.1 mg via INTRAVENOUS
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
## 2019-05-10 NOTE — Transfer of Care (Signed)
Immediate Anesthesia Transfer of Care Note  Patient: Jason Patterson  Procedure(s) Performed: ECT TX  Patient Location: PACU  Anesthesia Type:General  Level of Consciousness: awake and alert   Airway & Oxygen Therapy: Patient Spontanous Breathing and Patient connected to face mask oxygen  Post-op Assessment: Report given to RN, Post -op Vital signs reviewed and stable and Patient moving all extremities X 4  Post vital signs: Reviewed and stable  Last Vitals:  Vitals Value Taken Time  BP 120/71 05/10/19 1128  Temp 36.2 C 05/10/19 1128  Pulse 64 05/10/19 1128  Resp 18 05/10/19 1128  SpO2 100 % 05/10/19 1128  Vitals shown include unvalidated device data.  Last Pain:  Vitals:   05/10/19 1128  TempSrc:   PainSc: (P) Asleep         Complications: No apparent anesthesia complications

## 2019-05-10 NOTE — Procedures (Signed)
ECT SERVICES Physician's Interval Evaluation & Treatment Note  Patient Identification: Jason Patterson MRN:  [id number] Date of Evaluation:  05/10/2019 TX #: 16  MADRS:   MMSE:   P.E. Findings:  No change to physical  Psychiatric Interval Note:  Continues to complain of chronic depression but with no suicidal intent no bizarre behavior  Subjective:  Patient is a 63 y.o. male seen for evaluation for Electroconvulsive Therapy. Describes himself as depressed and suicidal but has no intention of harming himself.  Treatment Summary:   [x]   Right Unilateral             []  Bilateral   % Energy : 0.3 ms 100% 1290 ohms   Impedance: 1290 ohms  Seizure Energy Index: 5594 V squared  Postictal Suppression Index: 83%  Seizure Concordance Index: 95%  Medications  Pre Shock: Robinul 0.1 mg Toradol 30 mg Brevital 70 mg succinylcholine 100 mg  Post Shock:    Seizure Duration: 22 seconds by motor movement 52 seconds by EEG   Comments: Follow-up in 2 weeks  Lungs:  [x]   Clear to auscultation               []  Other:   Heart:    [x]   Regular rhythm             []  irregular rhythm    [x]   Previous H&P reviewed, patient examined and there are NO CHANGES                 []   Previous H&P reviewed, patient examined and there are changes noted.   Alethia Berthold, MD 6/22/20206:26 PM

## 2019-05-10 NOTE — Anesthesia Post-op Follow-up Note (Signed)
Anesthesia QCDR form completed.        

## 2019-05-10 NOTE — Anesthesia Postprocedure Evaluation (Signed)
Anesthesia Post Note  Patient: Jason Patterson  Procedure(s) Performed: ECT TX  Patient location during evaluation: PACU Anesthesia Type: General Level of consciousness: awake and alert Pain management: pain level controlled Vital Signs Assessment: post-procedure vital signs reviewed and stable Respiratory status: spontaneous breathing, nonlabored ventilation, respiratory function stable and patient connected to nasal cannula oxygen Cardiovascular status: blood pressure returned to baseline and stable Postop Assessment: no apparent nausea or vomiting Anesthetic complications: no     Last Vitals:  Vitals:   05/10/19 1153 05/10/19 1201  BP: 115/73 97/79  Pulse: 85 82  Resp: 20 18  Temp: (!) 36.3 C   SpO2: 96%     Last Pain:  Vitals:   05/10/19 1201  TempSrc: Oral  PainSc: 0-No pain                 Hayden Mabin S

## 2019-05-10 NOTE — Anesthesia Preprocedure Evaluation (Addendum)
Anesthesia Evaluation  Patient identified by MRN, date of birth, ID band Patient awake    Reviewed: Allergy & Precautions, NPO status , Patient's Chart, lab work & pertinent test results, reviewed documented beta blocker date and time   Airway Mallampati: III  TM Distance: >3 FB     Dental  (+) Chipped   Pulmonary shortness of breath, COPD, Current Smoker,           Cardiovascular hypertension, Pt. on medications and Pt. on home beta blockers + dysrhythmias      Neuro/Psych  Headaches, Seizures -,  PSYCHIATRIC DISORDERS Anxiety Depression Bipolar Disorder Schizophrenia  Neuromuscular disease    GI/Hepatic GERD  Controlled,  Endo/Other    Renal/GU Renal disease     Musculoskeletal   Abdominal   Peds  Hematology   Anesthesia Other Findings   Reproductive/Obstetrics                            Anesthesia Physical Anesthesia Plan  ASA: III  Anesthesia Plan: General   Post-op Pain Management:    Induction: Intravenous  PONV Risk Score and Plan:   Airway Management Planned: Oral ETT  Additional Equipment:   Intra-op Plan:   Post-operative Plan:   Informed Consent: I have reviewed the patients History and Physical, chart, labs and discussed the procedure including the risks, benefits and alternatives for the proposed anesthesia with the patient or authorized representative who has indicated his/her understanding and acceptance.       Plan Discussed with: CRNA  Anesthesia Plan Comments:         Anesthesia Quick Evaluation

## 2019-05-10 NOTE — Discharge Instructions (Signed)
1)  The drugs that you have been given will stay in your system until tomorrow so for the       next 24 hours you should not: ° A. Drive an automobile ° B. Make any legal decisions ° C. Drink any alcoholic beverages ° °2)  You may resume your regular meals upon return home. ° °3)  A responsible adult must take you home.  Someone should stay with you for a few          hours, then be available by phone for the remainder of the treatment day. ° °4)  You May experience any of the following symptoms: ° Headache, Nausea and a dry mouth (due to the medications you were given),  temporary memory loss and some confusion, or sore muscles (a warm bath  should help this).  If you you experience any of these symptoms let us know on                your return visit. ° °5)  Report any of the following: any acute discomfort, severe headache, or temperature        greater than 100.5 F.   Also report any unusual redness, swelling, drainage, or pain         at your IV site. ° °  You may report Symptoms to:  ECT PROGRAM- [place] at [facility] °         Phone: [phone], ECT Department  °         or Dr. Clapac's office [phone] ° °6)  Your next ECT Treatment is Monday July 6 ° We will call 2 days prior to your scheduled appointment for arrival times. ° °7)  Nothing to eat or drink after midnight the night before your procedure. ° °8)  Take     With a sip of water the morning of your procedure. ° °9)  Other Instructions: Call [phone] to cancel the morning of your procedure due         to illness or emergency. ° °10) We will call within 72 hours to assess how you are feeling.  °

## 2019-05-19 ENCOUNTER — Telehealth: Payer: Self-pay | Admitting: *Deleted

## 2019-05-20 ENCOUNTER — Ambulatory Visit: Payer: Medicaid Other | Admitting: Podiatry

## 2019-05-21 ENCOUNTER — Other Ambulatory Visit: Payer: Self-pay | Admitting: Psychiatry

## 2019-05-24 ENCOUNTER — Other Ambulatory Visit
Admission: RE | Admit: 2019-05-24 | Discharge: 2019-05-24 | Disposition: A | Discharge status: Home / Self Care | Payer: Medicaid Other | Source: Ambulatory Visit | Attending: Psychiatry | Admitting: Psychiatry

## 2019-05-24 ENCOUNTER — Encounter: Payer: Self-pay | Admitting: Anesthesiology

## 2019-05-24 ENCOUNTER — Ambulatory Visit: Payer: Self-pay | Admitting: Anesthesiology

## 2019-05-24 ENCOUNTER — Ambulatory Visit
Admission: RE | Admit: 2019-05-24 | Discharge: 2019-05-24 | Disposition: A | Discharge status: Home / Self Care | Payer: Medicaid Other | Source: Ambulatory Visit | Attending: Psychiatry | Admitting: Psychiatry

## 2019-05-24 ENCOUNTER — Other Ambulatory Visit: Payer: Self-pay

## 2019-05-24 DIAGNOSIS — Z79899 Other long term (current) drug therapy: Secondary | ICD-10-CM | POA: Diagnosis not present

## 2019-05-24 DIAGNOSIS — K219 Gastro-esophageal reflux disease without esophagitis: Secondary | ICD-10-CM | POA: Insufficient documentation

## 2019-05-24 DIAGNOSIS — F329 Major depressive disorder, single episode, unspecified: Secondary | ICD-10-CM | POA: Diagnosis present

## 2019-05-24 DIAGNOSIS — Z880 Allergy status to penicillin: Secondary | ICD-10-CM | POA: Diagnosis not present

## 2019-05-24 DIAGNOSIS — G40909 Epilepsy, unspecified, not intractable, without status epilepticus: Secondary | ICD-10-CM | POA: Insufficient documentation

## 2019-05-24 DIAGNOSIS — F419 Anxiety disorder, unspecified: Secondary | ICD-10-CM | POA: Insufficient documentation

## 2019-05-24 DIAGNOSIS — F332 Major depressive disorder, recurrent severe without psychotic features: Secondary | ICD-10-CM | POA: Diagnosis not present

## 2019-05-24 DIAGNOSIS — Z888 Allergy status to other drugs, medicaments and biological substances status: Secondary | ICD-10-CM | POA: Diagnosis not present

## 2019-05-24 DIAGNOSIS — Z1159 Encounter for screening for other viral diseases: Secondary | ICD-10-CM | POA: Insufficient documentation

## 2019-05-24 DIAGNOSIS — J449 Chronic obstructive pulmonary disease, unspecified: Secondary | ICD-10-CM | POA: Insufficient documentation

## 2019-05-24 DIAGNOSIS — I10 Essential (primary) hypertension: Secondary | ICD-10-CM | POA: Insufficient documentation

## 2019-05-24 DIAGNOSIS — F259 Schizoaffective disorder, unspecified: Secondary | ICD-10-CM | POA: Insufficient documentation

## 2019-05-24 DIAGNOSIS — F17210 Nicotine dependence, cigarettes, uncomplicated: Secondary | ICD-10-CM | POA: Insufficient documentation

## 2019-05-24 LAB — SARS CORONAVIRUS 2 BY RT PCR (HOSPITAL ORDER, PERFORMED IN ~~LOC~~ HOSPITAL LAB): SARS Coronavirus 2: NEGATIVE

## 2019-05-24 MED ORDER — SUCCINYLCHOLINE CHLORIDE 20 MG/ML IJ SOLN
INTRAMUSCULAR | Status: DC | PRN
  Administered 2019-05-24: 90 mg via INTRAVENOUS

## 2019-05-24 MED ORDER — GLYCOPYRROLATE 0.2 MG/ML IJ SOLN
INTRAMUSCULAR | Status: AC
  Administered 2019-05-24: 0.1 mg via INTRAVENOUS
  Filled 2019-05-24: qty 1

## 2019-05-24 MED ORDER — METHOHEXITAL SODIUM 100 MG/10ML IV SOSY
PREFILLED_SYRINGE | INTRAVENOUS | Status: DC | PRN
  Administered 2019-05-24: 70 mg via INTRAVENOUS

## 2019-05-24 MED ORDER — KETOROLAC TROMETHAMINE 30 MG/ML IJ SOLN
INTRAMUSCULAR | Status: AC
  Administered 2019-05-24: 30 mg via INTRAVENOUS
  Filled 2019-05-24: qty 1

## 2019-05-24 MED ORDER — SODIUM CHLORIDE 0.9 % IV SOLN
500.0000 mL | Freq: Once | INTRAVENOUS | Status: AC
  Administered 2019-05-24: 500 mL via INTRAVENOUS

## 2019-05-24 MED ORDER — GLYCOPYRROLATE 0.2 MG/ML IJ SOLN
0.1000 mg | Freq: Once | INTRAMUSCULAR | Status: AC
  Administered 2019-05-24: 0.1 mg via INTRAVENOUS

## 2019-05-24 MED ORDER — ONDANSETRON HCL 4 MG/2ML IJ SOLN: 4.0000 mg | Freq: Once | INTRAMUSCULAR | Status: DC | PRN

## 2019-05-24 MED ORDER — SODIUM CHLORIDE 0.9 % IV SOLN
INTRAVENOUS | Status: DC | PRN
  Administered 2019-05-24: 11:00:00 via INTRAVENOUS

## 2019-05-24 MED ORDER — KETOROLAC TROMETHAMINE 30 MG/ML IJ SOLN
30.0000 mg | Freq: Once | INTRAMUSCULAR | Status: AC
  Administered 2019-05-24: 09:00:00 30 mg via INTRAVENOUS

## 2019-05-24 NOTE — Discharge Instructions (Signed)
1)  The drugs that you have been given will stay in your system until tomorrow so for the       next 24 hours you should not:  A. Drive an automobile  B. Make any legal decisions  C. Drink any alcoholic beverages  2)  You may resume your regular meals upon return home.  3)  A responsible adult must take you home.  Someone should stay with you for a few          hours, then be available by phone for the remainder of the treatment day.  4)  You May experience any of the following symptoms:  Headache, Nausea and a dry mouth (due to the medications you were given),  temporary memory loss and some confusion, or sore muscles (a warm bath  should help this).  If you you experience any of these symptoms let us know on                your return visit.  5)  Report any of the following: any acute discomfort, severe headache, or temperature        greater than 100.5 F.   Also report any unusual redness, swelling, drainage, or pain         at your IV site.    You may report Symptoms to:  [place] [facility]          Phone: [phone], ECT Department           or Dr. Prescott Gum office [phone]  6)  Your next ECT Treatment is Monday July 27 at 8:00  We will call 2 days prior to your scheduled appointment for arrival times.  7)  Nothing to eat or drink after midnight the night before your procedure.  8)  Take     With a sip of water the morning of your procedure.  9)  Other Instructions: Call [phone] to cancel the morning of your procedure due         to illness or emergency.  10) We will call within 72 hours to assess how you are feeling.

## 2019-05-24 NOTE — Transfer of Care (Signed)
Immediate Anesthesia Transfer of Care Note  Patient: Jason Patterson  Procedure(s) Performed: ECT TX  Patient Location: PACU  Anesthesia Type:General  Level of Consciousness: awake  Airway & Oxygen Therapy: Patient Spontanous Breathing and Patient connected to face mask oxygen  Post-op Assessment: Report given to RN and Post -op Vital signs reviewed and stable  Post vital signs: Reviewed  Last Vitals:  Vitals Value Taken Time  BP 139/85 05/24/19 1107  Temp 36.3 C 05/24/19 1107  Pulse 54 05/24/19 1107  Resp 14 05/24/19 1107  SpO2 100 % 05/24/19 1107  Vitals shown include unvalidated device data.  Last Pain:  Vitals:   05/24/19 0912  TempSrc: Oral  PainSc: 0-No pain         Complications: No apparent anesthesia complications

## 2019-05-24 NOTE — Procedures (Signed)
ECT SERVICES Physician's Interval Evaluation & Treatment Note  Patient Identification: Sade Mehlhoff MRN:  [id number] Date of Evaluation:  05/24/2019 TX #: 17  MADRS:   MMSE:   P.E. Findings:  No change to physical exam  Psychiatric Interval Note:  The usual complaints about how people make fun of them out is group home.  Affect and behavior the same as usual.  Subjective:  Patient is a 63 y.o. male seen for evaluation for Electroconvulsive Therapy. No new complaint  Treatment Summary:   [x]   Right Unilateral             []  Bilateral   % Energy : 0.3 ms 100%   Impedance: 1150 ohms  Seizure Energy Index: 5437 V squared  Postictal Suppression Index: 84%  Seizure Concordance Index: 96%  Medications  Pre Shock: Robinul 0.1 mg Toradol 30 mg Brevital 70 mg succinylcholine 100 mg  Post Shock:    Seizure Duration: 25 seconds EMG 44 seconds EEG   Comments: Follow-up in 3 weeks  Lungs:  [x]   Clear to auscultation               []  Other:   Heart:    [x]   Regular rhythm             []  irregular rhythm    [x]   Previous H&P reviewed, patient examined and there are NO CHANGES                 []   Previous H&P reviewed, patient examined and there are changes noted.   Alethia Berthold, MD 7/6/202010:54 AM

## 2019-05-24 NOTE — Anesthesia Post-op Follow-up Note (Signed)
Anesthesia QCDR form completed.        

## 2019-05-24 NOTE — Anesthesia Preprocedure Evaluation (Signed)
Anesthesia Evaluation  Patient identified by MRN, date of birth, ID band Patient awake    Reviewed: Allergy & Precautions, NPO status , Patient's Chart, lab work & pertinent test results  History of Anesthesia Complications Negative for: history of anesthetic complications  Airway Mallampati: III       Dental  (+) Poor Dentition   Pulmonary COPD,  COPD inhaler, Current Smoker,    - rhonchi (-) wheezing      Cardiovascular hypertension, Pt. on medications (-) CAD, (-) Past MI, (-) Cardiac Stents and (-) CABG Dysrhythmias: RBBB.  - Systolic murmurs and - Diastolic murmurs    Neuro/Psych  Headaches, Seizures -, Well Controlled,  PSYCHIATRIC DISORDERS Anxiety Depression Bipolar Disorder Schizophrenia    GI/Hepatic Neg liver ROS, GERD  ,  Endo/Other  negative endocrine ROSneg diabetes  Renal/GU negative Renal ROS     Musculoskeletal negative musculoskeletal ROS (+)   Abdominal (+) - obese,   Peds  Hematology negative hematology ROS (+)   Anesthesia Other Findings   Reproductive/Obstetrics                             Anesthesia Physical  Anesthesia Plan  ASA: III  Anesthesia Plan: General   Post-op Pain Management:    Induction: Intravenous  PONV Risk Score and Plan: 0  Airway Management Planned: Mask  Additional Equipment:   Intra-op Plan:   Post-operative Plan:   Informed Consent: I have reviewed the patients History and Physical, chart, labs and discussed the procedure including the risks, benefits and alternatives for the proposed anesthesia with the patient or authorized representative who has indicated his/her understanding and acceptance.       Plan Discussed with: CRNA  Anesthesia Plan Comments:         Anesthesia Quick Evaluation
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
## 2019-05-24 NOTE — Anesthesia Postprocedure Evaluation (Signed)
Anesthesia Post Note  Patient: Jason Patterson  Procedure(s) Performed: ECT TX  Patient location during evaluation: PACU Anesthesia Type: General Level of consciousness: sedated Pain management: pain level controlled Vital Signs Assessment: post-procedure vital signs reviewed and stable Respiratory status: spontaneous breathing and respiratory function stable Cardiovascular status: stable Anesthetic complications: no     Last Vitals:  Vitals:   05/24/19 0912 05/24/19 1107  BP: 121/84 139/85  Pulse: 77 (!) 53  Resp: 18 20  Temp: 36.6 C (!) 36.3 C  SpO2: 100% 100%    Last Pain:  Vitals:   05/24/19 1107  TempSrc:   PainSc: (P) 0-No pain                 KEPHART,WILLIAM K

## 2019-06-17 ENCOUNTER — Other Ambulatory Visit: Payer: Self-pay | Admitting: Psychiatry

## 2019-06-18 ENCOUNTER — Other Ambulatory Visit
Admission: RE | Admit: 2019-06-18 | Discharge: 2019-06-18 | Disposition: A | Discharge status: Home / Self Care | Payer: Medicaid Other | Source: Ambulatory Visit | Attending: Psychiatry | Admitting: Psychiatry

## 2019-06-18 ENCOUNTER — Other Ambulatory Visit: Payer: Self-pay

## 2019-06-18 ENCOUNTER — Encounter: Payer: Self-pay | Admitting: Anesthesiology

## 2019-06-18 ENCOUNTER — Ambulatory Visit
Admission: RE | Admit: 2019-06-18 | Discharge: 2019-06-18 | Disposition: A | Discharge status: Home / Self Care | Payer: Medicaid Other | Source: Ambulatory Visit | Attending: Psychiatry | Admitting: Psychiatry

## 2019-06-18 ENCOUNTER — Ambulatory Visit: Payer: Self-pay | Admitting: Anesthesiology

## 2019-06-18 DIAGNOSIS — I10 Essential (primary) hypertension: Secondary | ICD-10-CM | POA: Insufficient documentation

## 2019-06-18 DIAGNOSIS — Z880 Allergy status to penicillin: Secondary | ICD-10-CM | POA: Diagnosis not present

## 2019-06-18 DIAGNOSIS — Z881 Allergy status to other antibiotic agents status: Secondary | ICD-10-CM | POA: Insufficient documentation

## 2019-06-18 DIAGNOSIS — J449 Chronic obstructive pulmonary disease, unspecified: Secondary | ICD-10-CM | POA: Insufficient documentation

## 2019-06-18 DIAGNOSIS — F17210 Nicotine dependence, cigarettes, uncomplicated: Secondary | ICD-10-CM | POA: Diagnosis not present

## 2019-06-18 DIAGNOSIS — Z20828 Contact with and (suspected) exposure to other viral communicable diseases: Secondary | ICD-10-CM | POA: Diagnosis not present

## 2019-06-18 DIAGNOSIS — F259 Schizoaffective disorder, unspecified: Secondary | ICD-10-CM | POA: Diagnosis present

## 2019-06-18 DIAGNOSIS — G40909 Epilepsy, unspecified, not intractable, without status epilepticus: Secondary | ICD-10-CM | POA: Diagnosis not present

## 2019-06-18 DIAGNOSIS — Z885 Allergy status to narcotic agent status: Secondary | ICD-10-CM | POA: Diagnosis not present

## 2019-06-18 DIAGNOSIS — F411 Generalized anxiety disorder: Secondary | ICD-10-CM | POA: Insufficient documentation

## 2019-06-18 DIAGNOSIS — F319 Bipolar disorder, unspecified: Secondary | ICD-10-CM | POA: Insufficient documentation

## 2019-06-18 DIAGNOSIS — F332 Major depressive disorder, recurrent severe without psychotic features: Secondary | ICD-10-CM | POA: Diagnosis not present

## 2019-06-18 DIAGNOSIS — Z888 Allergy status to other drugs, medicaments and biological substances status: Secondary | ICD-10-CM | POA: Diagnosis not present

## 2019-06-18 DIAGNOSIS — K219 Gastro-esophageal reflux disease without esophagitis: Secondary | ICD-10-CM | POA: Insufficient documentation

## 2019-06-18 LAB — SARS CORONAVIRUS 2 BY RT PCR (HOSPITAL ORDER, PERFORMED IN ~~LOC~~ HOSPITAL LAB): SARS Coronavirus 2: NEGATIVE

## 2019-06-18 MED ORDER — METHOHEXITAL SODIUM 0.5 G IJ SOLR
INTRAMUSCULAR | Status: AC
  Filled 2019-06-18: qty 500

## 2019-06-18 MED ORDER — KETOROLAC TROMETHAMINE 30 MG/ML IJ SOLN
INTRAMUSCULAR | Status: AC
  Filled 2019-06-18: qty 1

## 2019-06-18 MED ORDER — KETOROLAC TROMETHAMINE 30 MG/ML IJ SOLN
30.0000 mg | Freq: Once | INTRAMUSCULAR | Status: AC
  Administered 2019-06-18: 30 mg via INTRAVENOUS

## 2019-06-18 MED ORDER — METHOHEXITAL SODIUM 100 MG/10ML IV SOSY
PREFILLED_SYRINGE | INTRAVENOUS | Status: DC | PRN
  Administered 2019-06-18: 70 mg via INTRAVENOUS

## 2019-06-18 MED ORDER — GLYCOPYRROLATE 0.2 MG/ML IJ SOLN
INTRAMUSCULAR | Status: AC
  Filled 2019-06-18: qty 1

## 2019-06-18 MED ORDER — GLYCOPYRROLATE 0.2 MG/ML IJ SOLN
0.1000 mg | Freq: Once | INTRAMUSCULAR | Status: AC
  Administered 2019-06-18: 0.1 mg via INTRAVENOUS

## 2019-06-18 MED ORDER — SODIUM CHLORIDE 0.9 % IV SOLN
INTRAVENOUS | Status: DC | PRN
  Administered 2019-06-18: 10:00:00 via INTRAVENOUS

## 2019-06-18 MED ORDER — SODIUM CHLORIDE 0.9 % IV SOLN
500.0000 mL | Freq: Once | INTRAVENOUS | Status: AC
  Administered 2019-06-18: 500 mL via INTRAVENOUS

## 2019-06-18 MED ORDER — SUCCINYLCHOLINE CHLORIDE 20 MG/ML IJ SOLN
INTRAMUSCULAR | Status: DC | PRN
  Administered 2019-06-18: 90 mg via INTRAVENOUS
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
## 2019-06-18 NOTE — Procedures (Signed)
ECT SERVICES Physician's Interval Evaluation & Treatment Note  Patient Identification: Jason Patterson MRN:  [id number] Date of Evaluation:  06/18/2019 TX #: [id number]  MADRS:   MMSE:   P.E. Findings:  No change physical exam  Psychiatric Interval Note:  Mood and affect are pretty good  Subjective:  Patient is a 63 y.o. male seen for evaluation for Electroconvulsive Therapy. No specific complaint  Treatment Summary:   [x]   Right Unilateral             []  Bilateral   % Energy : 0.3 ms 100%   Impedance: 1070 ohms  Seizure Energy Index: 9367 V squared  Postictal Suppression Index: 47%  Seizure Concordance Index: 83%  Medications  Pre Shock: Robinul 0.1 mg Toradol 30 mg Brevital 70 mg succinylcholine 100 mg  Post Shock:    Seizure Duration: 22 seconds EMG 54 seconds EEG   Comments: Follow-up 3 weeks  Lungs:  [x]   Clear to auscultation               []  Other:   Heart:    [x]   Regular rhythm             []  irregular rhythm    [x]   Previous H&P reviewed, patient examined and there are NO CHANGES                 []   Previous H&P reviewed, patient examined and there are changes noted.   Alethia Berthold, MD 7/31/202010:11 AM

## 2019-06-18 NOTE — Anesthesia Postprocedure Evaluation (Signed)
Anesthesia Post Note  Patient: Jason Patterson  Procedure(s) Performed: ECT TX  Patient location during evaluation: PACU Anesthesia Type: General Level of consciousness: awake and alert Pain management: pain level controlled Vital Signs Assessment: post-procedure vital signs reviewed and stable Respiratory status: spontaneous breathing, nonlabored ventilation and respiratory function stable Cardiovascular status: blood pressure returned to baseline and stable Postop Assessment: no apparent nausea or vomiting Anesthetic complications: no     Last Vitals:  Vitals:   06/18/19 1050 06/18/19 1113  BP: (!) 121/92 (!) 113/91  Pulse: 80 85  Resp: 20 16  Temp:    SpO2: 96%     Last Pain:  Vitals:   06/18/19 1113  TempSrc:   PainSc: 0-No pain                 Durenda Hurt

## 2019-06-18 NOTE — Discharge Instructions (Signed)
1)  The drugs that you have been given will stay in your system until tomorrow so for the       next 24 hours you should not: ° A. Drive an automobile ° B. Make any legal decisions ° C. Drink any alcoholic beverages ° °2)  You may resume your regular meals upon return home. ° °3)  A responsible adult must take you home.  Someone should stay with you for a few          hours, then be available by phone for the remainder of the treatment day. ° °4)  You May experience any of the following symptoms: ° Headache, Nausea and a dry mouth (due to the medications you were given),  temporary memory loss and some confusion, or sore muscles (a warm bath  should help this).  If you you experience any of these symptoms let us know on                your return visit. ° °5)  Report any of the following: any acute discomfort, severe headache, or temperature        greater than 100.5 F.   Also report any unusual redness, swelling, drainage, or pain         at your IV site. ° °  You may report Symptoms to:  ECT PROGRAM- [place] [facility] °         Phone: [phone], ECT Department  °         or Dr. Clapac's office [phone] ° °6)  Your next ECT Treatment is Friday August 21 ° We will call 2 days prior to your scheduled appointment for arrival times. ° °7)  Nothing to eat or drink after midnight the night before your procedure. ° °8)  Take      With a sip of water the morning of your procedure. ° °9)  Other Instructions: Call [phone] to cancel the morning of your procedure due         to illness or emergency. ° °10) We will call within 72 hours to assess how you are feeling.  °
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
## 2019-06-18 NOTE — Transfer of Care (Signed)
Immediate Anesthesia Transfer of Care Note  Patient: Jason Patterson  Procedure(s) Performed: ECT TX  Patient Location: PACU  Anesthesia Type:General  Level of Consciousness: sedated and patient cooperative  Airway & Oxygen Therapy: Patient Spontanous Breathing and Patient connected to face mask oxygen  Post-op Assessment: Report given to RN and Post -op Vital signs reviewed and stable  Post vital signs: Reviewed  Last Vitals:  Vitals Value Taken Time  BP 147/89 06/18/19 1027  Temp    Pulse 75 06/18/19 1027  Resp 17 06/18/19 1027  SpO2 99 % 06/18/19 1027  Vitals shown include unvalidated device data.  Last Pain:  Vitals:   06/18/19 0933  TempSrc: Oral  PainSc: 0-No pain         Complications: No apparent anesthesia complications

## 2019-06-18 NOTE — Anesthesia Post-op Follow-up Note (Signed)
Anesthesia QCDR form completed.        

## 2019-07-09 ENCOUNTER — Other Ambulatory Visit: Payer: Self-pay | Admitting: Psychiatry

## 2019-07-09 ENCOUNTER — Encounter: Payer: Self-pay | Admitting: Anesthesiology

## 2019-07-09 ENCOUNTER — Other Ambulatory Visit
Admission: RE | Admit: 2019-07-09 | Discharge: 2019-07-09 | Disposition: A | Discharge status: Home / Self Care | Payer: Medicaid Other | Source: Ambulatory Visit | Attending: Psychiatry | Admitting: Psychiatry

## 2019-07-09 ENCOUNTER — Other Ambulatory Visit: Payer: Self-pay

## 2019-07-09 ENCOUNTER — Encounter
Admission: RE | Admit: 2019-07-09 | Discharge: 2019-07-09 | Disposition: A | Discharge status: Home / Self Care | Payer: Medicaid Other | Source: Ambulatory Visit | Attending: Psychiatry | Admitting: Psychiatry

## 2019-07-09 DIAGNOSIS — F17210 Nicotine dependence, cigarettes, uncomplicated: Secondary | ICD-10-CM | POA: Insufficient documentation

## 2019-07-09 DIAGNOSIS — J449 Chronic obstructive pulmonary disease, unspecified: Secondary | ICD-10-CM | POA: Diagnosis not present

## 2019-07-09 DIAGNOSIS — Z20828 Contact with and (suspected) exposure to other viral communicable diseases: Secondary | ICD-10-CM | POA: Insufficient documentation

## 2019-07-09 DIAGNOSIS — F332 Major depressive disorder, recurrent severe without psychotic features: Secondary | ICD-10-CM

## 2019-07-09 DIAGNOSIS — R4587 Impulsiveness: Secondary | ICD-10-CM | POA: Insufficient documentation

## 2019-07-09 DIAGNOSIS — R45851 Suicidal ideations: Secondary | ICD-10-CM | POA: Insufficient documentation

## 2019-07-09 DIAGNOSIS — F319 Bipolar disorder, unspecified: Secondary | ICD-10-CM | POA: Insufficient documentation

## 2019-07-09 DIAGNOSIS — Z1159 Encounter for screening for other viral diseases: Secondary | ICD-10-CM | POA: Diagnosis not present

## 2019-07-09 DIAGNOSIS — F259 Schizoaffective disorder, unspecified: Secondary | ICD-10-CM | POA: Insufficient documentation

## 2019-07-09 DIAGNOSIS — Z01812 Encounter for preprocedural laboratory examination: Secondary | ICD-10-CM | POA: Diagnosis not present

## 2019-07-09 DIAGNOSIS — I10 Essential (primary) hypertension: Secondary | ICD-10-CM | POA: Diagnosis not present

## 2019-07-09 LAB — SARS CORONAVIRUS 2 BY RT PCR (HOSPITAL ORDER, PERFORMED IN ~~LOC~~ HOSPITAL LAB): SARS Coronavirus 2: NEGATIVE

## 2019-07-09 MED ORDER — LABETALOL HCL 5 MG/ML IV SOLN
INTRAVENOUS | Status: DC | PRN
  Administered 2019-07-09: 10 mg via INTRAVENOUS

## 2019-07-09 MED ORDER — SODIUM CHLORIDE 0.9 % IV SOLN
500.0000 mL | Freq: Once | INTRAVENOUS | Status: AC
  Administered 2019-07-09: 10:00:00 via INTRAVENOUS

## 2019-07-09 MED ORDER — KETOROLAC TROMETHAMINE 30 MG/ML IJ SOLN: 30.0000 mg | Freq: Once | INTRAMUSCULAR | Status: DC

## 2019-07-09 MED ORDER — METHOHEXITAL SODIUM 100 MG/10ML IV SOSY
PREFILLED_SYRINGE | INTRAVENOUS | Status: DC | PRN
  Administered 2019-07-09: 70 mg via INTRAVENOUS

## 2019-07-09 MED ORDER — SUCCINYLCHOLINE CHLORIDE 200 MG/10ML IV SOSY
PREFILLED_SYRINGE | INTRAVENOUS | Status: DC | PRN
  Administered 2019-07-09: 90 mg via INTRAVENOUS

## 2019-07-09 MED ORDER — GLYCOPYRROLATE 0.2 MG/ML IJ SOLN: 0.1000 mg | Freq: Once | INTRAMUSCULAR | Status: DC

## 2019-07-09 MED ORDER — ONDANSETRON HCL 4 MG/2ML IJ SOLN: 4.0000 mg | Freq: Once | INTRAMUSCULAR | Status: DC | PRN

## 2019-07-09 MED ORDER — LABETALOL HCL 5 MG/ML IV SOLN
INTRAVENOUS | Status: AC
  Filled 2019-07-09: qty 4

## 2019-07-09 MED ORDER — SUCCINYLCHOLINE CHLORIDE 20 MG/ML IJ SOLN
INTRAMUSCULAR | Status: AC
  Filled 2019-07-09: qty 1

## 2019-07-09 NOTE — Transfer of Care (Signed)
Immediate Anesthesia Transfer of Care Note  Patient: Jason Patterson  Procedure(s) Performed: ECT TX  Patient Location: PACU  Anesthesia Type:General  Level of Consciousness: sedated  Airway & Oxygen Therapy: Patient Spontanous Breathing and Patient connected to face mask oxygen  Post-op Assessment: Report given to RN and Post -op Vital signs reviewed and stable  Post vital signs: Reviewed and stable  Last Vitals:  Vitals Value Taken Time  BP 136/93 07/09/19 1135  Temp    Pulse 65 07/09/19 1141  Resp 24 07/09/19 1141  SpO2 100 % 07/09/19 1141  Vitals shown include unvalidated device data.  Last Pain:  Vitals:   07/09/19 1123  TempSrc: Oral  PainSc: 0-No pain         Complications: No apparent anesthesia complications

## 2019-07-09 NOTE — Anesthesia Post-op Follow-up Note (Signed)
Anesthesia QCDR form completed.        

## 2019-07-09 NOTE — Anesthesia Procedure Notes (Signed)
Date/Time: 07/09/2019 11:25 AM Performed by: Dionne Bucy, CRNA Pre-anesthesia Checklist: Patient identified, Emergency Drugs available, Suction available and Patient being monitored Patient Re-evaluated:Patient Re-evaluated prior to induction Oxygen Delivery Method: Circle system utilized Preoxygenation: Pre-oxygenation with 100% oxygen Induction Type: IV induction Ventilation: Mask ventilation without difficulty and Mask ventilation throughout procedure Airway Equipment and Method: Bite block Placement Confirmation: positive ETCO2 Dental Injury: Teeth and Oropharynx as per pre-operative assessment

## 2019-07-09 NOTE — Anesthesia Preprocedure Evaluation (Signed)
Anesthesia Evaluation  Patient identified by MRN, date of birth, ID band Patient awake    Reviewed: Allergy & Precautions, H&P , NPO status , Patient's Chart, lab work & pertinent test results  Airway Mallampati: II  TM Distance: >3 FB Neck ROM: full    Dental  (+) Edentulous Lower, Edentulous Upper   Pulmonary COPD, Current Smoker,           Cardiovascular hypertension, Pt. on medications + dysrhythmias (RBBB)      Neuro/Psych  Headaches, Seizures -,  PSYCHIATRIC DISORDERS Anxiety Depression Bipolar Disorder Schizophrenia    GI/Hepatic Neg liver ROS, GERD  Medicated,  Endo/Other  negative endocrine ROS  Renal/GU CRFRenal disease  negative genitourinary   Musculoskeletal   Abdominal   Peds  Hematology negative hematology ROS (+)   Anesthesia Other Findings   Reproductive/Obstetrics negative OB ROS                             Anesthesia Physical  Anesthesia Plan  ASA: III  Anesthesia Plan: General   Post-op Pain Management:    Induction:   PONV Risk Score and Plan:   Airway Management Planned: Mask  Additional Equipment:   Intra-op Plan:   Post-operative Plan:   Informed Consent: I have reviewed the patients History and Physical, chart, labs and discussed the procedure including the risks, benefits and alternatives for the proposed anesthesia with the patient or authorized representative who has indicated his/her understanding and acceptance.     Dental Advisory Given  Plan Discussed with: Anesthesiologist and CRNA  Anesthesia Plan Comments:         Anesthesia Quick Evaluation

## 2019-07-09 NOTE — Anesthesia Postprocedure Evaluation (Signed)
Anesthesia Post Note  Patient: Jason Patterson  Procedure(s) Performed: ECT TX  Patient location during evaluation: PACU Anesthesia Type: General Level of consciousness: sedated Pain management: pain level controlled Vital Signs Assessment: post-procedure vital signs reviewed and stable Respiratory status: spontaneous breathing and respiratory function stable Cardiovascular status: stable Anesthetic complications: no     Last Vitals:  Vitals:   07/09/19 1123  BP: 138/90  Pulse: 80  Resp: 18  Temp: 36.7 C  SpO2: 98%    Last Pain:  Vitals:   07/09/19 1123  TempSrc: Oral  PainSc: 0-No pain                 KEPHART,WILLIAM K
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
## 2019-07-09 NOTE — Discharge Instructions (Signed)
1)  The drugs that you have been given will stay in your system until tomorrow so for the       next 24 hours you should not:  A. Drive an automobile  B. Make any legal decisions  C. Drink any alcoholic beverages  2)  You may resume your regular meals upon return home.  3)  A responsible adult must take you home.  Someone should stay with you for a few          hours, then be available by phone for the remainder of the treatment day.  4)  You May experience any of the following symptoms:  Headache, Nausea and a dry mouth (due to the medications you were given),  temporary memory loss and some confusion, or sore muscles (a warm bath  should help this).  If you you experience any of these symptoms let us know on                your return visit.  5)  Report any of the following: any acute discomfort, severe headache, or temperature        greater than 100.5 F.   Also report any unusual redness, swelling, drainage, or pain         at your IV site.    You may report Symptoms to:  [place] [facility]          Phone: [phone], ECT Department           or Dr. Prescott Gum office [phone]  6)  Your next ECT Treatment is Friday September 11 at 8:00  We will call 2 days prior to your scheduled appointment for arrival times.  7)  Nothing to eat or drink after midnight the night before your procedure.  8)  Take     With a sip of water the morning of your procedure.  9)  Other Instructions: Call [phone] to cancel the morning of your procedure due         to illness or emergency.  10) We will call within 72 hours to assess how you are feeling.

## 2019-07-09 NOTE — Procedures (Signed)
ECT SERVICES Physician's Interval Evaluation & Treatment Note  Patient Identification: Jason Patterson MRN:  [id number] Date of Evaluation:  07/09/2019 TX #: [id number]  MADRS:   MMSE:   P.E. Findings:  No change physical exam  Psychiatric Interval Note:  Chronic depression and anxiety  Subjective:  Patient is a 63 y.o. male seen for evaluation for Electroconvulsive Therapy. No new physical complaints  Treatment Summary:   [x]   Right Unilateral             []  Bilateral   % Energy : 0.3 ms 100%   Impedance: 1900 ohms  Seizure Energy Index: 7238 V squared  Postictal Suppression Index: 69%  Seizure Concordance Index: 92%  Medications  Pre Shock: Robinul 0.1 mg Toradol 30 mg Brevital 70 mg succinylcholine 100 mg  Post Shock:    Seizure Duration: 17 seconds EMG 36 seconds EEG   Comments: Follow-up in 3 weeks  Lungs:  [x]   Clear to auscultation               []  Other:   Heart:    [x]   Regular rhythm             []  irregular rhythm    [x]   Previous H&P reviewed, patient examined and there are NO CHANGES                 []   Previous H&P reviewed, patient examined and there are changes noted.   Alethia Berthold, MD 8/21/202011:20 AM

## 2019-07-26 ENCOUNTER — Other Ambulatory Visit: Payer: Self-pay

## 2019-07-26 ENCOUNTER — Encounter: Payer: Self-pay | Admitting: Emergency Medicine

## 2019-07-26 ENCOUNTER — Emergency Department
Admission: EM | Admit: 2019-07-26 | Discharge: 2019-07-26 | Disposition: A | Discharge status: Home / Self Care | Payer: Medicaid Other | Attending: Emergency Medicine | Admitting: Emergency Medicine

## 2019-07-26 DIAGNOSIS — Z7982 Long term (current) use of aspirin: Secondary | ICD-10-CM | POA: Insufficient documentation

## 2019-07-26 DIAGNOSIS — I129 Hypertensive chronic kidney disease with stage 1 through stage 4 chronic kidney disease, or unspecified chronic kidney disease: Secondary | ICD-10-CM | POA: Insufficient documentation

## 2019-07-26 DIAGNOSIS — R45851 Suicidal ideations: Secondary | ICD-10-CM | POA: Insufficient documentation

## 2019-07-26 DIAGNOSIS — F17210 Nicotine dependence, cigarettes, uncomplicated: Secondary | ICD-10-CM | POA: Diagnosis not present

## 2019-07-26 DIAGNOSIS — F250 Schizoaffective disorder, bipolar type: Secondary | ICD-10-CM | POA: Diagnosis not present

## 2019-07-26 DIAGNOSIS — J449 Chronic obstructive pulmonary disease, unspecified: Secondary | ICD-10-CM | POA: Insufficient documentation

## 2019-07-26 DIAGNOSIS — Z79899 Other long term (current) drug therapy: Secondary | ICD-10-CM | POA: Diagnosis not present

## 2019-07-26 DIAGNOSIS — F39 Unspecified mood [affective] disorder: Secondary | ICD-10-CM | POA: Diagnosis present

## 2019-07-26 DIAGNOSIS — N189 Chronic kidney disease, unspecified: Secondary | ICD-10-CM | POA: Diagnosis not present

## 2019-07-26 DIAGNOSIS — F259 Schizoaffective disorder, unspecified: Secondary | ICD-10-CM

## 2019-07-26 LAB — COMPREHENSIVE METABOLIC PANEL
ALT: 15 U/L (ref 0–44)
AST: 17 U/L (ref 15–41)
Albumin: 4.5 g/dL (ref 3.5–5.0)
Alkaline Phosphatase: 91 U/L (ref 38–126)
Anion gap: 9 (ref 5–15)
BUN: 14 mg/dL (ref 8–23)
CO2: 23 mmol/L (ref 22–32)
Calcium: 9.6 mg/dL (ref 8.9–10.3)
Chloride: 104 mmol/L (ref 98–111)
Creatinine, Ser: 0.79 mg/dL (ref 0.61–1.24)
GFR calc Af Amer: >60 mL/min (ref >60)
GFR calc non Af Amer: >60 mL/min (ref >60)
Glucose, Bld: 102 mg/dL — ABNORMAL HIGH (ref 70–99)
Potassium: 4.4 mmol/L (ref 3.5–5.1)
Sodium: 136 mmol/L (ref 135–145)
Total Bilirubin: 0.6 mg/dL (ref 0.3–1.2)
Total Protein: 7.5 g/dL (ref 6.5–8.1)

## 2019-07-26 LAB — URINE DRUG SCREEN, QUALITATIVE (ARMC ONLY)
Amphetamines, Ur Screen: NOT DETECTED
Barbiturates, Ur Screen: NOT DETECTED
Benzodiazepine, Ur Scrn: NOT DETECTED
Cannabinoid 50 Ng, Ur ~~LOC~~: NOT DETECTED
Cocaine Metabolite,Ur ~~LOC~~: NOT DETECTED
MDMA (Ecstasy)Ur Screen: NOT DETECTED
Methadone Scn, Ur: NOT DETECTED
Opiate, Ur Screen: NOT DETECTED
Phencyclidine (PCP) Ur S: NOT DETECTED
Tricyclic, Ur Screen: NOT DETECTED

## 2019-07-26 LAB — CBC
HCT: 41.3 % (ref 39.0–52.0)
Hemoglobin: 14 g/dL (ref 13.0–17.0)
MCH: 31.7 pg (ref 26.0–34.0)
MCHC: 33.9 g/dL (ref 30.0–36.0)
MCV: 93.4 fL (ref 80.0–100.0)
Platelets: 241 10*3/uL (ref 150–400)
RBC: 4.42 MIL/uL (ref 4.22–5.81)
RDW: 13 % (ref 11.5–15.5)
WBC: 7.4 10*3/uL (ref 4.0–10.5)
nRBC: 0 % (ref 0.0–0.2)

## 2019-07-26 LAB — ACETAMINOPHEN LEVEL: Acetaminophen (Tylenol), Serum: <10 ug/mL — ABNORMAL LOW (ref 10–30)

## 2019-07-26 LAB — SALICYLATE LEVEL: Salicylate Lvl: <7 mg/dL (ref 2.8–30.0)

## 2019-07-26 LAB — ETHANOL: Alcohol, Ethyl (B): <10 mg/dL (ref <10)

## 2019-07-26 MED ORDER — LOXAPINE SUCCINATE 5 MG PO CAPS: 25.0000 mg | ORAL_CAPSULE | Freq: Three times a day (TID) | ORAL | Status: DC

## 2019-07-26 MED ORDER — FAMOTIDINE 20 MG PO TABS: 10.0000 mg | ORAL_TABLET | Freq: Two times a day (BID) | ORAL | Status: DC

## 2019-07-26 MED ORDER — VITAMIN D3 25 MCG (1000 UNIT) PO TABS: 2000.0000 [IU] | ORAL_TABLET | Freq: Every day | ORAL | Status: DC

## 2019-07-26 MED ORDER — ASPIRIN EC 81 MG PO TBEC: 81.0000 mg | DELAYED_RELEASE_TABLET | Freq: Every day | ORAL | Status: DC

## 2019-07-26 MED ORDER — METOPROLOL TARTRATE 25 MG PO TABS: 12.5000 mg | ORAL_TABLET | Freq: Two times a day (BID) | ORAL | Status: DC

## 2019-07-26 MED ORDER — LEVETIRACETAM 500 MG PO TABS: 1000.0000 mg | ORAL_TABLET | Freq: Two times a day (BID) | ORAL | Status: DC

## 2019-07-26 MED ORDER — OLANZAPINE 10 MG PO TABS: 20.0000 mg | ORAL_TABLET | Freq: Every day | ORAL | Status: DC
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
## 2019-07-26 NOTE — ED Notes (Signed)
Attempted to contact group home again. No answer. Contacted [place] [employer] to alert staff member to pt needing to be picked up.

## 2019-07-26 NOTE — ED Notes (Addendum)
Patient belongings:  Veleta Miners shoes, blue pants, grey underwear, orange/black/white striped shirt and key on a string.

## 2019-07-26 NOTE — ED Provider Notes (Signed)
Patient seen and cleared by psych.  He is not homicidal or suicidal.  He is not wanting to go back to his group home but apparently there is nothing we can do about that now.  He told Kerry Dory that the women there at the group home tell him what to do when he does not like that.   Nena Polio, MD 07/26/19 L[place]line Hare
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
## 2019-07-26 NOTE — ED Notes (Signed)
IVC PENDING  CONSULT ?

## 2019-07-26 NOTE — BH Assessment (Addendum)
Writer spoke with patient guardian with [facility] [phone]) and she's in agreement with patient discharging back to the Group Home.  Writer called and left a HIPPA Compliant message with Group Home, [facility] [phone]), requesting a return phone call.

## 2019-07-26 NOTE — ED Notes (Signed)
This RN calls group home number [phone]. Phone call is answered and then hung up. 2nd attempt went to voicemail. Called a landline and left a message. TTS called earlier today to speak to group home and  No one has returned the call.

## 2019-07-26 NOTE — Discharge Instructions (Addendum)
Please continue to follow-up with your healthcare providers and take all of your medicines.  Please return as needed.
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
## 2019-07-26 NOTE — ED Notes (Addendum)
[place] PD picked pt up. New number received for new owner of group home. Scot Jun [phone]. This RN gave officer number written on d/c papers for staff to call once she got pt settled for report. Pt states he doesn't want to go back to the group home. Advised pt he was being d/c and that he was not being admitted. He has been cleared by psych and does not meet admission criteria. Pt given belongings by Ed [employer]ant to change clothes.

## 2019-07-28 ENCOUNTER — Telehealth: Payer: Self-pay

## 2019-07-29 ENCOUNTER — Other Ambulatory Visit: Payer: Self-pay | Admitting: Psychiatry

## 2019-07-30 ENCOUNTER — Ambulatory Visit
Admission: RE | Admit: 2019-07-30 | Discharge: 2019-07-30 | Disposition: A | Discharge status: Home / Self Care | Payer: Medicaid Other | Source: Ambulatory Visit | Attending: Psychiatry | Admitting: Psychiatry

## 2019-07-30 ENCOUNTER — Encounter: Payer: Self-pay | Admitting: Anesthesiology

## 2019-07-30 ENCOUNTER — Other Ambulatory Visit
Admission: RE | Admit: 2019-07-30 | Discharge: 2019-07-30 | Disposition: A | Discharge status: Home / Self Care | Payer: Medicaid Other | Source: Ambulatory Visit | Attending: Psychiatry | Admitting: Psychiatry

## 2019-07-30 ENCOUNTER — Other Ambulatory Visit: Payer: Self-pay

## 2019-07-30 DIAGNOSIS — I10 Essential (primary) hypertension: Secondary | ICD-10-CM | POA: Insufficient documentation

## 2019-07-30 DIAGNOSIS — Z880 Allergy status to penicillin: Secondary | ICD-10-CM | POA: Insufficient documentation

## 2019-07-30 DIAGNOSIS — G40909 Epilepsy, unspecified, not intractable, without status epilepticus: Secondary | ICD-10-CM | POA: Diagnosis not present

## 2019-07-30 DIAGNOSIS — F251 Schizoaffective disorder, depressive type: Secondary | ICD-10-CM

## 2019-07-30 DIAGNOSIS — F17210 Nicotine dependence, cigarettes, uncomplicated: Secondary | ICD-10-CM | POA: Insufficient documentation

## 2019-07-30 DIAGNOSIS — F319 Bipolar disorder, unspecified: Secondary | ICD-10-CM | POA: Diagnosis not present

## 2019-07-30 DIAGNOSIS — Z885 Allergy status to narcotic agent status: Secondary | ICD-10-CM | POA: Insufficient documentation

## 2019-07-30 DIAGNOSIS — F259 Schizoaffective disorder, unspecified: Secondary | ICD-10-CM | POA: Insufficient documentation

## 2019-07-30 DIAGNOSIS — Z20828 Contact with and (suspected) exposure to other viral communicable diseases: Secondary | ICD-10-CM | POA: Insufficient documentation

## 2019-07-30 DIAGNOSIS — K219 Gastro-esophageal reflux disease without esophagitis: Secondary | ICD-10-CM | POA: Insufficient documentation

## 2019-07-30 DIAGNOSIS — F411 Generalized anxiety disorder: Secondary | ICD-10-CM | POA: Diagnosis not present

## 2019-07-30 DIAGNOSIS — J449 Chronic obstructive pulmonary disease, unspecified: Secondary | ICD-10-CM | POA: Diagnosis not present

## 2019-07-30 DIAGNOSIS — Z888 Allergy status to other drugs, medicaments and biological substances status: Secondary | ICD-10-CM | POA: Diagnosis not present

## 2019-07-30 DIAGNOSIS — F329 Major depressive disorder, single episode, unspecified: Secondary | ICD-10-CM | POA: Diagnosis present

## 2019-07-30 LAB — SARS CORONAVIRUS 2 BY RT PCR (HOSPITAL ORDER, PERFORMED IN ~~LOC~~ HOSPITAL LAB): SARS Coronavirus 2: NEGATIVE

## 2019-07-30 MED ORDER — GLYCOPYRROLATE 0.2 MG/ML IJ SOLN
0.1000 mg | Freq: Once | INTRAMUSCULAR | Status: AC
  Administered 2019-07-30: 10:00:00 0.1 mg via INTRAVENOUS

## 2019-07-30 MED ORDER — METHOHEXITAL SODIUM 100 MG/10ML IV SOSY
PREFILLED_SYRINGE | INTRAVENOUS | Status: DC | PRN
  Administered 2019-07-30: 70 mg via INTRAVENOUS

## 2019-07-30 MED ORDER — KETOROLAC TROMETHAMINE 30 MG/ML IJ SOLN
INTRAMUSCULAR | Status: AC
  Administered 2019-07-30: 30 mg via INTRAVENOUS
  Filled 2019-07-30: qty 1

## 2019-07-30 MED ORDER — GLYCOPYRROLATE 0.2 MG/ML IJ SOLN
INTRAMUSCULAR | Status: AC
  Administered 2019-07-30: 0.1 mg via INTRAVENOUS
  Filled 2019-07-30: qty 1

## 2019-07-30 MED ORDER — SUCCINYLCHOLINE CHLORIDE 20 MG/ML IJ SOLN
INTRAMUSCULAR | Status: AC
  Filled 2019-07-30: qty 1

## 2019-07-30 MED ORDER — LABETALOL HCL 5 MG/ML IV SOLN
INTRAVENOUS | Status: AC
  Filled 2019-07-30: qty 4

## 2019-07-30 MED ORDER — SODIUM CHLORIDE 0.9 % IV SOLN
500.0000 mL | Freq: Once | INTRAVENOUS | Status: AC
  Administered 2019-07-30: 500 mL via INTRAVENOUS

## 2019-07-30 MED ORDER — LABETALOL HCL 5 MG/ML IV SOLN
INTRAVENOUS | Status: DC | PRN
  Administered 2019-07-30 (×2): 10 mg via INTRAVENOUS

## 2019-07-30 MED ORDER — SUCCINYLCHOLINE CHLORIDE 200 MG/10ML IV SOSY
PREFILLED_SYRINGE | INTRAVENOUS | Status: DC | PRN
  Administered 2019-07-30: 90 mg via INTRAVENOUS

## 2019-07-30 MED ORDER — KETOROLAC TROMETHAMINE 30 MG/ML IJ SOLN
30.0000 mg | Freq: Once | INTRAMUSCULAR | Status: AC
  Administered 2019-07-30: 10:00:00 30 mg via INTRAVENOUS

## 2019-07-30 NOTE — Anesthesia Postprocedure Evaluation (Signed)
Anesthesia Post Note  Patient: Jason Patterson  Procedure(s) Performed: ECT TX  Patient location during evaluation: PACU Anesthesia Type: General Level of consciousness: awake and alert Pain management: pain level controlled Vital Signs Assessment: post-procedure vital signs reviewed and stable Respiratory status: spontaneous breathing, nonlabored ventilation and respiratory function stable Cardiovascular status: blood pressure returned to baseline and stable Postop Assessment: no apparent nausea or vomiting Anesthetic complications: no     Last Vitals:  Vitals:   07/30/19 1113 07/30/19 1151  BP: 125/83   Pulse: 65 65  Resp: 20 18  Temp:  36.6 C  SpO2: 96%     Last Pain:  Vitals:   07/30/19 1151  TempSrc: Oral  PainSc: 0-No pain                 Durenda Hurt

## 2019-07-30 NOTE — Anesthesia Post-op Follow-up Note (Signed)
Anesthesia QCDR form completed.        
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
## 2019-07-30 NOTE — Procedures (Signed)
ECT SERVICES Physician's Interval Evaluation & Treatment Note  Patient Identification: Jason Patterson MRN:  [id number] Date of Evaluation:  07/30/2019 TX #: 20  MADRS:   MMSE:   P.E. Findings:  No change to physical exam  Psychiatric Interval Note:  Chronic anxiety and depression really nothing different than usual  Subjective:  Patient is a 63 y.o. male seen for evaluation for Electroconvulsive Therapy. No specific complaint  Treatment Summary:   [x]   Right Unilateral             []  Bilateral   % Energy : 0.3 ms 100%   Impedance: 2310 ohms  Seizure Energy Index: 11,180 V squared  Postictal Suppression Index: 61%  Seizure Concordance Index: 92%  Medications  Pre Shock: Robinul 0.1 mg Toradol 30 mg Brevital 70 mg succinylcholine 100 mg  Post Shock:    Seizure Duration: 28 seconds EMG 56 seconds EEG   Comments: Follow-up 3 weeks  Lungs:  [x]   Clear to auscultation               []  Other:   Heart:    [x]   Regular rhythm             []  irregular rhythm    [x]   Previous H&P reviewed, patient examined and there are NO CHANGES                 []   Previous H&P reviewed, patient examined and there are changes noted.   Alethia Berthold, MD 9/11/202010:37 AM

## 2019-07-30 NOTE — Anesthesia Procedure Notes (Signed)
Date/Time: 07/30/2019 10:44 AM Performed by: Dionne Bucy, CRNA Pre-anesthesia Checklist: Patient identified, Emergency Drugs available, Suction available and Patient being monitored Patient Re-evaluated:Patient Re-evaluated prior to induction Oxygen Delivery Method: Circle system utilized Preoxygenation: Pre-oxygenation with 100% oxygen Induction Type: IV induction Ventilation: Mask ventilation without difficulty and Mask ventilation throughout procedure Airway Equipment and Method: Bite block Placement Confirmation: positive ETCO2 Dental Injury: Teeth and Oropharynx as per pre-operative assessment

## 2019-07-30 NOTE — Discharge Instructions (Signed)
1)  The drugs that you have been given will stay in your system until tomorrow so for the       next 24 hours you should not:  A. Drive an automobile  B. Make any legal decisions  C. Drink any alcoholic beverages  2)  You may resume your regular meals upon return home.  3)  A responsible adult must take you home.  Someone should stay with you for a few          hours, then be available by phone for the remainder of the treatment day.  4)  You May experience any of the following symptoms:  Headache, Nausea and a dry mouth (due to the medications you were given),  temporary memory loss and some confusion, or sore muscles (a warm bath  should help this).  If you you experience any of these symptoms let us know on                your return visit.  5)  Report any of the following: any acute discomfort, severe headache, or temperature        greater than 100.5 F.   Also report any unusual redness, swelling, drainage, or pain         at your IV site.    You may report Symptoms to:  [place] [facility]          Phone: [phone], ECT Department           or Dr. Prescott Gum office [phone]  6)  Your next ECT Treatment is Friday October 2 at 8:00  We will call 2 days prior to your scheduled appointment for arrival times.  7)  Nothing to eat or drink after midnight the night before your procedure.  8)  Take    With a sip of water the morning of your procedure.  9)  Other Instructions: Call [phone] to cancel the morning of your procedure due         to illness or emergency.  10) We will call within 72 hours to assess how you are feeling.

## 2019-07-30 NOTE — Transfer of Care (Signed)
Immediate Anesthesia Transfer of Care Note  Patient: Jason Patterson  Procedure(s) Performed: ECT TX  Patient Location: PACU  Anesthesia Type:General  Level of Consciousness: sedated  Airway & Oxygen Therapy: Patient Spontanous Breathing and Patient connected to face mask oxygen  Post-op Assessment: Report given to RN and Post -op Vital signs reviewed and stable  Post vital signs: Reviewed and stable  Last Vitals:  Vitals Value Taken Time  BP 131/86 07/30/19 1053  Temp 36.6 C 07/30/19 1053  Pulse 64 07/30/19 1056  Resp 25 07/30/19 1056  SpO2 100 % 07/30/19 1056  Vitals shown include unvalidated device data.  Last Pain:  Vitals:   07/30/19 1053  TempSrc:   PainSc: 0-No pain         Complications: No apparent anesthesia complications
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
## 2019-08-16 ENCOUNTER — Telehealth: Payer: Self-pay

## 2019-08-17 ENCOUNTER — Other Ambulatory Visit: Payer: Self-pay | Admitting: Psychiatry

## 2019-08-18 ENCOUNTER — Other Ambulatory Visit
Admission: RE | Admit: 2019-08-18 | Discharge: 2019-08-18 | Disposition: A | Discharge status: Home / Self Care | Payer: Medicaid Other | Source: Ambulatory Visit | Attending: Psychiatry | Admitting: Psychiatry

## 2019-08-18 ENCOUNTER — Encounter: Payer: Self-pay | Admitting: Anesthesiology

## 2019-08-18 ENCOUNTER — Other Ambulatory Visit: Payer: Self-pay

## 2019-08-18 ENCOUNTER — Encounter
Admission: RE | Admit: 2019-08-18 | Discharge: 2019-08-18 | Disposition: A | Discharge status: Home / Self Care | Payer: Medicaid Other | Source: Ambulatory Visit | Attending: Psychiatry | Admitting: Psychiatry

## 2019-08-18 DIAGNOSIS — F319 Bipolar disorder, unspecified: Secondary | ICD-10-CM | POA: Diagnosis not present

## 2019-08-18 DIAGNOSIS — F411 Generalized anxiety disorder: Secondary | ICD-10-CM | POA: Insufficient documentation

## 2019-08-18 DIAGNOSIS — J449 Chronic obstructive pulmonary disease, unspecified: Secondary | ICD-10-CM | POA: Insufficient documentation

## 2019-08-18 DIAGNOSIS — Z20828 Contact with and (suspected) exposure to other viral communicable diseases: Secondary | ICD-10-CM | POA: Insufficient documentation

## 2019-08-18 DIAGNOSIS — Z888 Allergy status to other drugs, medicaments and biological substances status: Secondary | ICD-10-CM | POA: Insufficient documentation

## 2019-08-18 DIAGNOSIS — F250 Schizoaffective disorder, bipolar type: Secondary | ICD-10-CM | POA: Diagnosis not present

## 2019-08-18 DIAGNOSIS — Z880 Allergy status to penicillin: Secondary | ICD-10-CM | POA: Insufficient documentation

## 2019-08-18 DIAGNOSIS — I10 Essential (primary) hypertension: Secondary | ICD-10-CM | POA: Diagnosis not present

## 2019-08-18 DIAGNOSIS — Z883 Allergy status to other anti-infective agents status: Secondary | ICD-10-CM | POA: Insufficient documentation

## 2019-08-18 DIAGNOSIS — Z01812 Encounter for preprocedural laboratory examination: Secondary | ICD-10-CM | POA: Diagnosis present

## 2019-08-18 DIAGNOSIS — Z886 Allergy status to analgesic agent status: Secondary | ICD-10-CM | POA: Diagnosis not present

## 2019-08-18 DIAGNOSIS — F339 Major depressive disorder, recurrent, unspecified: Secondary | ICD-10-CM | POA: Diagnosis present

## 2019-08-18 DIAGNOSIS — F332 Major depressive disorder, recurrent severe without psychotic features: Secondary | ICD-10-CM | POA: Diagnosis not present

## 2019-08-18 LAB — SARS CORONAVIRUS 2 BY RT PCR (HOSPITAL ORDER, PERFORMED IN ~~LOC~~ HOSPITAL LAB): SARS Coronavirus 2: NEGATIVE

## 2019-08-18 MED ORDER — METHOHEXITAL SODIUM 100 MG/10ML IV SOSY
PREFILLED_SYRINGE | INTRAVENOUS | Status: DC | PRN
  Administered 2019-08-18: 70 mg via INTRAVENOUS

## 2019-08-18 MED ORDER — KETOROLAC TROMETHAMINE 30 MG/ML IJ SOLN
30.0000 mg | Freq: Once | INTRAMUSCULAR | Status: AC
  Administered 2019-08-18: 10:00:00 30 mg via INTRAVENOUS

## 2019-08-18 MED ORDER — LABETALOL HCL 5 MG/ML IV SOLN
INTRAVENOUS | Status: AC
  Filled 2019-08-18: qty 4

## 2019-08-18 MED ORDER — LABETALOL HCL 5 MG/ML IV SOLN
INTRAVENOUS | Status: DC | PRN
  Administered 2019-08-18: 10 mg via INTRAVENOUS

## 2019-08-18 MED ORDER — SODIUM CHLORIDE 0.9 % IV SOLN
INTRAVENOUS | Status: DC | PRN
  Administered 2019-08-18: 11:00:00 via INTRAVENOUS

## 2019-08-18 MED ORDER — SUCCINYLCHOLINE CHLORIDE 200 MG/10ML IV SOSY
PREFILLED_SYRINGE | INTRAVENOUS | Status: DC | PRN
  Administered 2019-08-18: 90 mg via INTRAVENOUS

## 2019-08-18 MED ORDER — SUCCINYLCHOLINE CHLORIDE 20 MG/ML IJ SOLN
INTRAMUSCULAR | Status: AC
  Filled 2019-08-18: qty 1

## 2019-08-18 MED ORDER — GLYCOPYRROLATE 0.2 MG/ML IJ SOLN
INTRAMUSCULAR | Status: AC
  Administered 2019-08-18: 09:00:00 0.1 mg via INTRAVENOUS
  Filled 2019-08-18: qty 1

## 2019-08-18 MED ORDER — SODIUM CHLORIDE 0.9 % IV SOLN
500.0000 mL | Freq: Once | INTRAVENOUS | Status: AC
  Administered 2019-08-18: 09:00:00 500 mL via INTRAVENOUS

## 2019-08-18 MED ORDER — KETOROLAC TROMETHAMINE 30 MG/ML IJ SOLN
INTRAMUSCULAR | Status: AC
  Administered 2019-08-18: 10:00:00 30 mg via INTRAVENOUS
  Filled 2019-08-18: qty 1

## 2019-08-18 MED ORDER — GLYCOPYRROLATE 0.2 MG/ML IJ SOLN
0.1000 mg | Freq: Once | INTRAMUSCULAR | Status: AC
  Administered 2019-08-18: 09:00:00 0.1 mg via INTRAVENOUS

## 2019-08-18 NOTE — Procedures (Signed)
ECT SERVICES Physician's Interval Evaluation & Treatment Note  Patient Identification: Shae Augello MRN:  [id number] Date of Evaluation:  08/18/2019 TX #: 21  MADRS:   MMSE:   P.E. Findings:  No change to physical exam  Psychiatric Interval Note:  Chronic anxiety  Subjective:  Patient is a 63 y.o. male seen for evaluation for Electroconvulsive Therapy. Not different than usual  Treatment Summary:   [x]   Right Unilateral             []  Bilateral   % Energy : 0.3 ms 100%   Impedance: 1650 ohms  Seizure Energy Index: 6444 V squared  Postictal Suppression Index: 63%  Seizure Concordance Index: 96%  Medications  Pre Shock: Robinul 0.1 mg Toradol 30 mg Brevital 70 mg succinylcholine 100 mg  Post Shock:    Seizure Duration: 22 seconds EMG 46 seconds EEG   Comments: Follow-up 3 weeks  Lungs:  [x]   Clear to auscultation               []  Other:   Heart:    [x]   Regular rhythm             []  irregular rhythm    [x]   Previous H&P reviewed, patient examined and there are NO CHANGES                 []   Previous H&P reviewed, patient examined and there are changes noted.   Alethia Berthold, MD 9/30/202011:19 AM

## 2019-08-18 NOTE — Anesthesia Post-op Follow-up Note (Signed)
Anesthesia QCDR form completed.        

## 2019-08-18 NOTE — Anesthesia Preprocedure Evaluation (Signed)
Anesthesia Evaluation  Patient identified by MRN, date of birth, ID band Patient awake    Reviewed: Allergy & Precautions, NPO status , Patient's Chart, lab work & pertinent test results  History of Anesthesia Complications Negative for: history of anesthetic complications  Airway Mallampati: III  TM Distance: >3 FB Neck ROM: Full    Dental  (+) Poor Dentition   Pulmonary COPD,  COPD inhaler, Current Smoker,    breath sounds clear to auscultation- rhonchi (-) wheezing      Cardiovascular hypertension, Pt. on medications (-) CAD, (-) Past MI, (-) Cardiac Stents and (-) CABG Dysrhythmias: RBBB.  Rhythm:Regular Rate:Normal - Systolic murmurs and - Diastolic murmurs    Neuro/Psych  Headaches, Seizures -, Well Controlled,  PSYCHIATRIC DISORDERS Anxiety Depression Bipolar Disorder Schizophrenia    GI/Hepatic Neg liver ROS, GERD  ,  Endo/Other  negative endocrine ROSneg diabetes  Renal/GU negative Renal ROS     Musculoskeletal negative musculoskeletal ROS (+)   Abdominal (+) - obese,   Peds  Hematology negative hematology ROS (+)   Anesthesia Other Findings Past Medical History: No date: Anxiety No date: Bipolar disorder, unspecified (HCC) No date: Coarse tremors No date: COPD (chronic obstructive pulmonary disease) (HCC) No date: Depression No date: Diastolic dysfunction No date: Dysrhythmia No date: Essential hypertension, benign No date: Generalized anxiety disorder No date: GERD (gastroesophageal reflux disease) No date: Headache(784.0) No date: Physical deconditioning No date: Psoriasis No date: Rhabdomyolysis No date: Schizoaffective disorder, unspecified condition No date: Seizure disorder (HCC) No date: Seizures (HCC)     Comment:  NONE SINCE AGE 63 No date: Sepsis (HCC) No date: Shortness of breath     Comment:  W/ EXERTION    Reproductive/Obstetrics                              Anesthesia Physical  Anesthesia Plan  ASA: III  Anesthesia Plan: General   Post-op Pain Management:    Induction: Intravenous  PONV Risk Score and Plan: 0  Airway Management Planned: Mask  Additional Equipment:   Intra-op Plan:   Post-operative Plan:   Informed Consent: I have reviewed the patients History and Physical, chart, labs and discussed the procedure including the risks, benefits and alternatives for the proposed anesthesia with the patient or authorized representative who has indicated his/her understanding and acceptance.     Dental advisory given  Plan Discussed with: CRNA and Anesthesiologist  Anesthesia Plan Comments:         Anesthesia Quick Evaluation  

## 2019-08-18 NOTE — Discharge Instructions (Signed)
1)  The drugs that you have been given will stay in your system until tomorrow so for the       next 24 hours you should not:  A. Drive an automobile  B. Make any legal decisions  C. Drink any alcoholic beverages  2)  You may resume your regular meals upon return home.  3)  A responsible adult must take you home.  Someone should stay with you for a few          hours, then be available by phone for the remainder of the treatment day.  4)  You May experience any of the following symptoms:  Headache, Nausea and a dry mouth (due to the medications you were given),  temporary memory loss and some confusion, or sore muscles (a warm bath  should help this).  If you you experience any of these symptoms let us know on                your return visit.  5)  Report any of the following: any acute discomfort, severe headache, or temperature        greater than 100.5 F.   Also report any unusual redness, swelling, drainage, or pain         at your IV site.    You may report Symptoms to:  [place] at [facility]          Phone: [phone], ECT Department           or Dr. Prescott Gum office [phone]  6)  Your next ECT Treatment is Wednesday October 21 at 8:00 to receive a COVID test prior to your ECT appointment   We will call 2 days prior to your scheduled appointment for arrival times.  7)  Nothing to eat or drink after midnight the night before your procedure.  8)  Take      With a sip of water the morning of your procedure.  9)  Other Instructions: Call [phone] to cancel the morning of your procedure due         to illness or emergency.  10) We will call within 72 hours to assess how you are feeling.

## 2019-08-18 NOTE — Anesthesia Procedure Notes (Signed)
Date/Time: 08/18/2019 11:26 AM Performed by: Dionne Bucy, CRNA Pre-anesthesia Checklist: Patient identified, Emergency Drugs available, Suction available and Patient being monitored Patient Re-evaluated:Patient Re-evaluated prior to induction Oxygen Delivery Method: Circle system utilized Preoxygenation: Pre-oxygenation with 100% oxygen Induction Type: IV induction Ventilation: Mask ventilation without difficulty and Mask ventilation throughout procedure Airway Equipment and Method: Bite block Placement Confirmation: positive ETCO2 Dental Injury: Teeth and Oropharynx as per pre-operative assessment
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
## 2019-08-18 NOTE — Transfer of Care (Signed)
Immediate Anesthesia Transfer of Care Note  Patient: Jason Patterson  Procedure(s) Performed: ECT TX  Patient Location: PACU  Anesthesia Type:General  Level of Consciousness: sedated  Airway & Oxygen Therapy: Patient Spontanous Breathing and Patient connected to face mask oxygen  Post-op Assessment: Report given to RN and Post -op Vital signs reviewed and stable  Post vital signs: Reviewed and stable  Last Vitals:  Vitals Value Taken Time  BP 135/88 08/18/19 1135  Temp    Pulse 61 08/18/19 1136  Resp 17 08/18/19 1136  SpO2 100 % 08/18/19 1136  Vitals shown include unvalidated device data.  Last Pain:  Vitals:   08/18/19 0916  TempSrc: Oral  PainSc: 0-No pain         Complications: No apparent anesthesia complications

## 2019-08-20 ENCOUNTER — Other Ambulatory Visit: Payer: Medicaid Other

## 2019-08-20 ENCOUNTER — Other Ambulatory Visit: Payer: Self-pay

## 2019-08-20 NOTE — Anesthesia Postprocedure Evaluation (Signed)
Anesthesia Post Note  Patient: Jason Patterson  Procedure(s) Performed: ECT TX  Patient location during evaluation: PACU Anesthesia Type: General Level of consciousness: awake and alert Pain management: pain level controlled Vital Signs Assessment: post-procedure vital signs reviewed and stable Respiratory status: spontaneous breathing, nonlabored ventilation and respiratory function stable Cardiovascular status: blood pressure returned to baseline and stable Postop Assessment: no signs of nausea or vomiting Anesthetic complications: no     Last Vitals:  Vitals:   08/18/19 1155 08/18/19 1206  BP: 138/81 (!) 141/90  Pulse: (!) 59 64  Resp: 15 18  Temp: 36.9 C   SpO2: 97% 100%    Last Pain:  Vitals:   08/18/19 1155  TempSrc:   PainSc: 0-No pain                 Shanekqua Schaper

## 2019-08-30 ENCOUNTER — Telehealth: Payer: Self-pay

## 2019-08-30 ENCOUNTER — Other Ambulatory Visit: Payer: Self-pay

## 2019-08-30 ENCOUNTER — Ambulatory Visit (LOCAL_COMMUNITY_HEALTH_CENTER): Payer: Self-pay

## 2019-08-30 DIAGNOSIS — Z111 Encounter for screening for respiratory tuberculosis: Secondary | ICD-10-CM

## 2019-09-02 ENCOUNTER — Ambulatory Visit (LOCAL_COMMUNITY_HEALTH_CENTER): Payer: Medicaid Other

## 2019-09-02 ENCOUNTER — Other Ambulatory Visit: Payer: Self-pay

## 2019-09-02 DIAGNOSIS — Z111 Encounter for screening for respiratory tuberculosis: Secondary | ICD-10-CM

## 2019-09-02 LAB — TB SKIN TEST
Induration: 0 mm
TB Skin Test: NEGATIVE

## 2019-09-07 ENCOUNTER — Other Ambulatory Visit: Payer: Self-pay | Admitting: Psychiatry

## 2019-09-10 ENCOUNTER — Telehealth: Payer: Self-pay

## 2019-09-13 ENCOUNTER — Other Ambulatory Visit
Admission: RE | Admit: 2019-09-13 | Discharge: 2019-09-13 | Disposition: A | Discharge status: Home / Self Care | Payer: Medicaid Other | Source: Ambulatory Visit | Attending: Psychiatry | Admitting: Psychiatry

## 2019-09-13 ENCOUNTER — Other Ambulatory Visit: Payer: Self-pay

## 2019-09-13 DIAGNOSIS — Z20828 Contact with and (suspected) exposure to other viral communicable diseases: Secondary | ICD-10-CM | POA: Insufficient documentation

## 2019-09-13 DIAGNOSIS — Z01812 Encounter for preprocedural laboratory examination: Secondary | ICD-10-CM | POA: Diagnosis not present

## 2019-09-13 LAB — SARS CORONAVIRUS 2 (TAT 6-24 HRS): SARS Coronavirus 2: NEGATIVE

## 2019-09-14 ENCOUNTER — Other Ambulatory Visit: Payer: Self-pay | Admitting: Psychiatry

## 2019-09-15 ENCOUNTER — Ambulatory Visit: Payer: Self-pay | Admitting: Anesthesiology

## 2019-09-15 ENCOUNTER — Encounter
Admission: RE | Admit: 2019-09-15 | Discharge: 2019-09-15 | Disposition: A | Discharge status: Home / Self Care | Payer: Medicaid Other | Source: Ambulatory Visit | Attending: Psychiatry | Admitting: Psychiatry

## 2019-09-15 ENCOUNTER — Encounter: Payer: Self-pay | Admitting: Anesthesiology

## 2019-09-15 ENCOUNTER — Other Ambulatory Visit: Payer: Self-pay

## 2019-09-15 DIAGNOSIS — I10 Essential (primary) hypertension: Secondary | ICD-10-CM | POA: Diagnosis not present

## 2019-09-15 DIAGNOSIS — F339 Major depressive disorder, recurrent, unspecified: Secondary | ICD-10-CM | POA: Diagnosis present

## 2019-09-15 DIAGNOSIS — F319 Bipolar disorder, unspecified: Secondary | ICD-10-CM | POA: Diagnosis not present

## 2019-09-15 DIAGNOSIS — F17210 Nicotine dependence, cigarettes, uncomplicated: Secondary | ICD-10-CM | POA: Diagnosis not present

## 2019-09-15 DIAGNOSIS — J449 Chronic obstructive pulmonary disease, unspecified: Secondary | ICD-10-CM | POA: Diagnosis not present

## 2019-09-15 DIAGNOSIS — F411 Generalized anxiety disorder: Secondary | ICD-10-CM | POA: Diagnosis not present

## 2019-09-15 DIAGNOSIS — F251 Schizoaffective disorder, depressive type: Secondary | ICD-10-CM | POA: Diagnosis not present

## 2019-09-15 DIAGNOSIS — F259 Schizoaffective disorder, unspecified: Secondary | ICD-10-CM | POA: Insufficient documentation

## 2019-09-15 DIAGNOSIS — G40909 Epilepsy, unspecified, not intractable, without status epilepticus: Secondary | ICD-10-CM | POA: Insufficient documentation

## 2019-09-15 DIAGNOSIS — Z883 Allergy status to other anti-infective agents status: Secondary | ICD-10-CM | POA: Diagnosis not present

## 2019-09-15 DIAGNOSIS — Z89429 Acquired absence of other toe(s), unspecified side: Secondary | ICD-10-CM | POA: Insufficient documentation

## 2019-09-15 DIAGNOSIS — Z880 Allergy status to penicillin: Secondary | ICD-10-CM | POA: Diagnosis not present

## 2019-09-15 DIAGNOSIS — Z888 Allergy status to other drugs, medicaments and biological substances status: Secondary | ICD-10-CM | POA: Insufficient documentation

## 2019-09-15 MED ORDER — GLYCOPYRROLATE 0.2 MG/ML IJ SOLN
INTRAMUSCULAR | Status: AC
  Filled 2019-09-15: qty 1

## 2019-09-15 MED ORDER — KETOROLAC TROMETHAMINE 30 MG/ML IJ SOLN
30.0000 mg | Freq: Once | INTRAMUSCULAR | Status: AC
  Administered 2019-09-15: 09:00:00 30 mg via INTRAVENOUS

## 2019-09-15 MED ORDER — LABETALOL HCL 5 MG/ML IV SOLN
INTRAVENOUS | Status: DC | PRN
  Administered 2019-09-15: 10 mg via INTRAVENOUS

## 2019-09-15 MED ORDER — SUCCINYLCHOLINE CHLORIDE 20 MG/ML IJ SOLN
INTRAMUSCULAR | Status: AC
  Filled 2019-09-15: qty 1

## 2019-09-15 MED ORDER — SODIUM CHLORIDE 0.9 % IV SOLN
INTRAVENOUS | Status: DC | PRN
  Administered 2019-09-15: 11:00:00 via INTRAVENOUS

## 2019-09-15 MED ORDER — KETOROLAC TROMETHAMINE 30 MG/ML IJ SOLN
INTRAMUSCULAR | Status: AC
  Administered 2019-09-15: 30 mg via INTRAVENOUS
  Filled 2019-09-15: qty 1

## 2019-09-15 MED ORDER — GLYCOPYRROLATE 0.2 MG/ML IJ SOLN
0.1000 mg | Freq: Once | INTRAMUSCULAR | Status: AC
  Administered 2019-09-15: 0.1 mg via INTRAVENOUS

## 2019-09-15 MED ORDER — SODIUM CHLORIDE 0.9 % IV SOLN
500.0000 mL | Freq: Once | INTRAVENOUS | Status: AC
  Administered 2019-09-15: 500 mL via INTRAVENOUS

## 2019-09-15 MED ORDER — LABETALOL HCL 5 MG/ML IV SOLN
INTRAVENOUS | Status: AC
  Filled 2019-09-15: qty 4

## 2019-09-15 MED ORDER — METHOHEXITAL SODIUM 100 MG/10ML IV SOSY
PREFILLED_SYRINGE | INTRAVENOUS | Status: DC | PRN
  Administered 2019-09-15: 70 mg via INTRAVENOUS

## 2019-09-15 MED ORDER — SUCCINYLCHOLINE CHLORIDE 200 MG/10ML IV SOSY
PREFILLED_SYRINGE | INTRAVENOUS | Status: DC | PRN
  Administered 2019-09-15: 90 mg via INTRAVENOUS

## 2019-09-15 NOTE — Discharge Instructions (Signed)
1)  The drugs that you have been given will stay in your system until tomorrow so for the       next 24 hours you should not:  A. Drive an automobile  B. Make any legal decisions  C. Drink any alcoholic beverages  2)  You may resume your regular meals upon return home.  3)  A responsible adult must take you home.  Someone should stay with you for a few          hours, then be available by phone for the remainder of the treatment day.  4)  You May experience any of the following symptoms:  Headache, Nausea and a dry mouth (due to the medications you were given),  temporary memory loss and some confusion, or sore muscles (a warm bath  should help this).  If you you experience any of these symptoms let us know on                your return visit.  5)  Report any of the following: any acute discomfort, severe headache, or temperature        greater than 100.5 F.   Also report any unusual redness, swelling, drainage, or pain         at your IV site.    You may report Symptoms to:  [place] [facility]          Phone: [phone], ECT Department           or Dr. Prescott Gum office [phone]  6)  Your next ECT Treatment is Wednesday November 18   We will call the week prior to your scheduled appointment for arrival times.  7)  Nothing to eat or drink after midnight the night before your procedure.  8)  Take      With a sip of water the morning of your procedure.  9)  Other Instructions: Call [phone] to cancel the morning of your procedure due         to illness or emergency.  10) We will call within 72 hours to assess how you are feeling.

## 2019-09-15 NOTE — Anesthesia Post-op Follow-up Note (Signed)
Anesthesia QCDR form completed.        

## 2019-09-15 NOTE — Procedures (Signed)
ECT SERVICES Physician's Interval Evaluation & Treatment Note  Patient Identification: Jason Patterson MRN:  [id number] Date of Evaluation:  09/15/2019 TX #: [id number]  MADRS:   MMSE:   P.E. Findings:  No change to physical exam  Psychiatric Interval Note:  Jason Patterson much the same although that means he still has sadness and nervousness out of proportion to any stress  Subjective:  Patient is a 63 y.o. male seen for evaluation for Electroconvulsive Therapy. Sometimes current tearful.  Treatment Summary:   [x]   Right Unilateral             []  Bilateral   % Energy : 0.3 ms 100%   Impedance: 1630 ohms  Seizure Energy Index: 4738 V squared  Postictal Suppression Index: 98 %  Seizure Concordance Index: 93%  Medications  Pre Shock: Robinul 0.1 mg Toradol 30 mg Brevital 70 mg succinylcholine 100 mg  Post Shock:    Seizure Duration: 25 seconds EMG 44 seconds EEG   Comments: Follow-up 3 weeks  Lungs:  [x]   Clear to auscultation               []  Other:   Heart:    [x]   Regular rhythm             []  irregular rhythm    [x]   Previous H&P reviewed, patient examined and there are NO CHANGES                 []   Previous H&P reviewed, patient examined and there are changes noted.   Jason Berthold, MD 10/28/202011:41 AM
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
## 2019-09-15 NOTE — Transfer of Care (Signed)
Immediate Anesthesia Transfer of Care Note  Patient: Jason Patterson  Procedure(s) Performed: ECT TX  Patient Location: PACU  Anesthesia Type:General  Level of Consciousness: sedated  Airway & Oxygen Therapy: Patient Spontanous Breathing and Patient connected to face mask oxygen  Post-op Assessment: Report given to RN and Post -op Vital signs reviewed and stable  Post vital signs: Reviewed and stable  Last Vitals:  Vitals Value Taken Time  BP 136/86 09/15/19 1149  Temp 36 C 09/15/19 1149  Pulse 58 09/15/19 1152  Resp 20 09/15/19 1152  SpO2 100 % 09/15/19 1152  Vitals shown include unvalidated device data.  Last Pain:  Vitals:   09/15/19 1149  TempSrc:   PainSc: 0-No pain         Complications: No apparent anesthesia complications

## 2019-09-15 NOTE — Anesthesia Postprocedure Evaluation (Signed)
Anesthesia Post Note  Patient: Jason Patterson  Procedure(s) Performed: ECT TX  Patient location during evaluation: PACU Anesthesia Type: General Level of consciousness: awake and alert Pain management: pain level controlled Vital Signs Assessment: post-procedure vital signs reviewed and stable Respiratory status: spontaneous breathing, nonlabored ventilation and respiratory function stable Cardiovascular status: blood pressure returned to baseline and stable Postop Assessment: no signs of nausea or vomiting Anesthetic complications: no     Last Vitals:  Vitals:   09/15/19 1210 09/15/19 1214  BP: (!) 135/91 134/80  Pulse: 60 60  Resp: 16 18  Temp:  (!) 36.3 C  SpO2: 99%     Last Pain:  Vitals:   09/15/19 1214  TempSrc: Oral  PainSc: 0-No pain                 Joyice Magda

## 2019-09-15 NOTE — Anesthesia Preprocedure Evaluation (Signed)
Anesthesia Evaluation  Patient identified by MRN, date of birth, ID band Patient awake    Reviewed: Allergy & Precautions, NPO status , Patient's Chart, lab work & pertinent test results  History of Anesthesia Complications Negative for: history of anesthetic complications  Airway Mallampati: III  TM Distance: >3 FB Neck ROM: Full    Dental  (+) Poor Dentition   Pulmonary COPD,  COPD inhaler, Current Smoker,    breath sounds clear to auscultation- rhonchi (-) wheezing      Cardiovascular hypertension, Pt. on medications (-) CAD, (-) Past MI, (-) Cardiac Stents and (-) CABG Dysrhythmias: RBBB.  Rhythm:Regular Rate:Normal - Systolic murmurs and - Diastolic murmurs    Neuro/Psych  Headaches, Seizures -, Well Controlled,  PSYCHIATRIC DISORDERS Anxiety Depression Bipolar Disorder Schizophrenia    GI/Hepatic Neg liver ROS, GERD  ,  Endo/Other  negative endocrine ROSneg diabetes  Renal/GU negative Renal ROS     Musculoskeletal negative musculoskeletal ROS (+)   Abdominal (+) - obese,   Peds  Hematology negative hematology ROS (+)   Anesthesia Other Findings Past Medical History: No date: Anxiety No date: Bipolar disorder, unspecified ([facility]) No date: Coarse tremors No date: COPD (chronic obstructive pulmonary disease) ([facility]) No date: Depression No date: Diastolic dysfunction No date: Dysrhythmia No date: Essential hypertension, benign No date: Generalized anxiety disorder No date: GERD (gastroesophageal reflux disease) No date: Headache(784.0) No date: Physical deconditioning No date: Psoriasis No date: Rhabdomyolysis No date: Schizoaffective disorder, unspecified condition No date: Seizure disorder ([place]) No date: Seizures ([place])     Comment:  NONE SINCE AGE 63 No date: Sepsis ([place]) No date: Shortness of breath     Comment:  W/ EXERTION    Reproductive/Obstetrics                              Anesthesia Physical  Anesthesia Plan  ASA: III  Anesthesia Plan: General   Post-op Pain Management:    Induction: Intravenous  PONV Risk Score and Plan: 0  Airway Management Planned: Mask  Additional Equipment:   Intra-op Plan:   Post-operative Plan:   Informed Consent: I have reviewed the patients History and Physical, chart, labs and discussed the procedure including the risks, benefits and alternatives for the proposed anesthesia with the patient or authorized representative who has indicated his/her understanding and acceptance.     Dental advisory given  Plan Discussed with: CRNA and Anesthesiologist  Anesthesia Plan Comments:         Anesthesia Quick Evaluation

## 2019-09-24 ENCOUNTER — Telehealth: Payer: Self-pay

## 2019-09-27 ENCOUNTER — Telehealth: Payer: Self-pay

## 2019-09-29 ENCOUNTER — Telehealth: Payer: Self-pay

## 2019-10-03 ENCOUNTER — Emergency Department: Payer: Medicaid Other

## 2019-10-03 ENCOUNTER — Other Ambulatory Visit: Payer: Self-pay

## 2019-10-03 ENCOUNTER — Inpatient Hospital Stay
Admission: EM | Admit: 2019-10-03 | Discharge: 2019-10-30 | DRG: 313 | Disposition: A | Discharge status: Skilled Nursing Facility | Payer: Medicaid Other | Attending: Internal Medicine | Admitting: Internal Medicine

## 2019-10-03 ENCOUNTER — Encounter: Payer: Self-pay | Admitting: Emergency Medicine

## 2019-10-03 DIAGNOSIS — F17210 Nicotine dependence, cigarettes, uncomplicated: Secondary | ICD-10-CM | POA: Diagnosis present

## 2019-10-03 DIAGNOSIS — I248 Other forms of acute ischemic heart disease: Secondary | ICD-10-CM | POA: Diagnosis present

## 2019-10-03 DIAGNOSIS — R197 Diarrhea, unspecified: Secondary | ICD-10-CM

## 2019-10-03 DIAGNOSIS — N400 Enlarged prostate without lower urinary tract symptoms: Secondary | ICD-10-CM | POA: Diagnosis present

## 2019-10-03 DIAGNOSIS — F411 Generalized anxiety disorder: Secondary | ICD-10-CM | POA: Diagnosis present

## 2019-10-03 DIAGNOSIS — F250 Schizoaffective disorder, bipolar type: Secondary | ICD-10-CM | POA: Diagnosis present

## 2019-10-03 DIAGNOSIS — K2100 Gastro-esophageal reflux disease with esophagitis, without bleeding: Secondary | ICD-10-CM | POA: Diagnosis present

## 2019-10-03 DIAGNOSIS — Z23 Encounter for immunization: Secondary | ICD-10-CM | POA: Diagnosis not present

## 2019-10-03 DIAGNOSIS — J449 Chronic obstructive pulmonary disease, unspecified: Secondary | ICD-10-CM | POA: Diagnosis present

## 2019-10-03 DIAGNOSIS — Z79899 Other long term (current) drug therapy: Secondary | ICD-10-CM

## 2019-10-03 DIAGNOSIS — R079 Chest pain, unspecified: Secondary | ICD-10-CM

## 2019-10-03 DIAGNOSIS — R1310 Dysphagia, unspecified: Secondary | ICD-10-CM | POA: Diagnosis not present

## 2019-10-03 DIAGNOSIS — R112 Nausea with vomiting, unspecified: Secondary | ICD-10-CM | POA: Diagnosis not present

## 2019-10-03 DIAGNOSIS — R569 Unspecified convulsions: Secondary | ICD-10-CM | POA: Diagnosis not present

## 2019-10-03 DIAGNOSIS — K5900 Constipation, unspecified: Secondary | ICD-10-CM | POA: Diagnosis present

## 2019-10-03 DIAGNOSIS — Z720 Tobacco use: Secondary | ICD-10-CM | POA: Diagnosis not present

## 2019-10-03 DIAGNOSIS — R45851 Suicidal ideations: Secondary | ICD-10-CM | POA: Diagnosis not present

## 2019-10-03 DIAGNOSIS — I10 Essential (primary) hypertension: Secondary | ICD-10-CM | POA: Diagnosis present

## 2019-10-03 DIAGNOSIS — K449 Diaphragmatic hernia without obstruction or gangrene: Secondary | ICD-10-CM | POA: Diagnosis present

## 2019-10-03 DIAGNOSIS — I214 Non-ST elevation (NSTEMI) myocardial infarction: Secondary | ICD-10-CM | POA: Diagnosis not present

## 2019-10-03 DIAGNOSIS — R0789 Other chest pain: Secondary | ICD-10-CM | POA: Diagnosis present

## 2019-10-03 DIAGNOSIS — D638 Anemia in other chronic diseases classified elsewhere: Secondary | ICD-10-CM | POA: Diagnosis present

## 2019-10-03 DIAGNOSIS — H409 Unspecified glaucoma: Secondary | ICD-10-CM | POA: Diagnosis present

## 2019-10-03 DIAGNOSIS — G40909 Epilepsy, unspecified, not intractable, without status epilepticus: Secondary | ICD-10-CM | POA: Diagnosis present

## 2019-10-03 DIAGNOSIS — Z9119 Patient's noncompliance with other medical treatment and regimen: Secondary | ICD-10-CM

## 2019-10-03 DIAGNOSIS — Z7982 Long term (current) use of aspirin: Secondary | ICD-10-CM | POA: Diagnosis not present

## 2019-10-03 DIAGNOSIS — Z20828 Contact with and (suspected) exposure to other viral communicable diseases: Secondary | ICD-10-CM | POA: Diagnosis present

## 2019-10-03 DIAGNOSIS — R7989 Other specified abnormal findings of blood chemistry: Secondary | ICD-10-CM | POA: Diagnosis present

## 2019-10-03 DIAGNOSIS — R4589 Other symptoms and signs involving emotional state: Secondary | ICD-10-CM | POA: Diagnosis not present

## 2019-10-03 DIAGNOSIS — R778 Other specified abnormalities of plasma proteins: Secondary | ICD-10-CM | POA: Diagnosis present

## 2019-10-03 LAB — HEPARIN LEVEL (UNFRACTIONATED)
Heparin Unfractionated: 1.03 IU/mL — ABNORMAL HIGH (ref 0.30–0.70)
Heparin Unfractionated: 0.32 IU/mL (ref 0.30–0.70)
Heparin Unfractionated: 0.23 IU/mL — ABNORMAL LOW (ref 0.30–0.70)

## 2019-10-03 LAB — CBC WITH DIFFERENTIAL/PLATELET
Abs Immature Granulocytes: 0.06 10*3/uL (ref 0.00–0.07)
Basophils Absolute: 0.1 10*3/uL (ref 0.0–0.1)
Basophils Relative: 1 %
Eosinophils Absolute: 0.1 10*3/uL (ref 0.0–0.5)
Eosinophils Relative: 1 %
HCT: 32.7 % — ABNORMAL LOW (ref 39.0–52.0)
Hemoglobin: 10.9 g/dL — ABNORMAL LOW (ref 13.0–17.0)
Immature Granulocytes: 1 %
Lymphocytes Relative: 14 %
Lymphs Abs: 1.8 10*3/uL (ref 0.7–4.0)
MCH: 31.5 pg (ref 26.0–34.0)
MCHC: 33.3 g/dL (ref 30.0–36.0)
MCV: 94.5 fL (ref 80.0–100.0)
Monocytes Absolute: 0.9 10*3/uL (ref 0.1–1.0)
Monocytes Relative: 7 %
Neutro Abs: 10.2 10*3/uL — ABNORMAL HIGH (ref 1.7–7.7)
Neutrophils Relative %: 76 %
Platelets: 223 10*3/uL (ref 150–400)
RBC: 3.46 MIL/uL — ABNORMAL LOW (ref 4.22–5.81)
RDW: 14.4 % (ref 11.5–15.5)
WBC: 13 10*3/uL — ABNORMAL HIGH (ref 4.0–10.5)
nRBC: 0 % (ref 0.0–0.2)

## 2019-10-03 LAB — BASIC METABOLIC PANEL
Anion gap: 8 (ref 5–15)
BUN: 19 mg/dL (ref 8–23)
CO2: 23 mmol/L (ref 22–32)
Calcium: 8.5 mg/dL — ABNORMAL LOW (ref 8.9–10.3)
Chloride: 104 mmol/L (ref 98–111)
Creatinine, Ser: 0.72 mg/dL (ref 0.61–1.24)
GFR calc Af Amer: >60 mL/min (ref >60)
GFR calc non Af Amer: >60 mL/min (ref >60)
Glucose, Bld: 131 mg/dL — ABNORMAL HIGH (ref 70–99)
Potassium: 3.1 mmol/L — ABNORMAL LOW (ref 3.5–5.1)
Sodium: 135 mmol/L (ref 135–145)

## 2019-10-03 LAB — PROTIME-INR
INR: 1.2 (ref 0.8–1.2)
INR: 1.2 (ref 0.8–1.2)
Prothrombin Time: 14.6 seconds (ref 11.4–15.2)
Prothrombin Time: 14.8 seconds (ref 11.4–15.2)

## 2019-10-03 LAB — LIPID PANEL
Cholesterol: 99 mg/dL (ref 0–200)
HDL: 31 mg/dL — ABNORMAL LOW (ref >40)
LDL Cholesterol: 53 mg/dL (ref 0–99)
Total CHOL/HDL Ratio: 3.2 RATIO
Triglycerides: 77 mg/dL (ref <150)
VLDL: 15 mg/dL (ref 0–40)

## 2019-10-03 LAB — COMPREHENSIVE METABOLIC PANEL
ALT: 14 U/L (ref 0–44)
AST: 18 U/L (ref 15–41)
Albumin: 3.4 g/dL — ABNORMAL LOW (ref 3.5–5.0)
Alkaline Phosphatase: 60 U/L (ref 38–126)
Anion gap: 8 (ref 5–15)
BUN: 34 mg/dL — ABNORMAL HIGH (ref 8–23)
CO2: 21 mmol/L — ABNORMAL LOW (ref 22–32)
Calcium: 8.5 mg/dL — ABNORMAL LOW (ref 8.9–10.3)
Chloride: 104 mmol/L (ref 98–111)
Creatinine, Ser: 0.82 mg/dL (ref 0.61–1.24)
GFR calc Af Amer: >60 mL/min (ref >60)
GFR calc non Af Amer: >60 mL/min (ref >60)
Glucose, Bld: 104 mg/dL — ABNORMAL HIGH (ref 70–99)
Potassium: 3.5 mmol/L (ref 3.5–5.1)
Sodium: 133 mmol/L — ABNORMAL LOW (ref 135–145)
Total Bilirubin: 0.6 mg/dL (ref 0.3–1.2)
Total Protein: 6 g/dL — ABNORMAL LOW (ref 6.5–8.1)

## 2019-10-03 LAB — CBC
HCT: 26 % — ABNORMAL LOW (ref 39.0–52.0)
Hemoglobin: 9 g/dL — ABNORMAL LOW (ref 13.0–17.0)
MCH: 31.6 pg (ref 26.0–34.0)
MCHC: 34.6 g/dL (ref 30.0–36.0)
MCV: 91.2 fL (ref 80.0–100.0)
Platelets: 198 10*3/uL (ref 150–400)
RBC: 2.85 MIL/uL — ABNORMAL LOW (ref 4.22–5.81)
RDW: 14.2 % (ref 11.5–15.5)
WBC: 9.8 10*3/uL (ref 4.0–10.5)
nRBC: 0 % (ref 0.0–0.2)

## 2019-10-03 LAB — TROPONIN I (HIGH SENSITIVITY)
Troponin I (High Sensitivity): 150 ng/L (ref <18)
Troponin I (High Sensitivity): 123 ng/L (ref <18)

## 2019-10-03 LAB — SARS CORONAVIRUS 2 (TAT 6-24 HRS): SARS Coronavirus 2: NEGATIVE

## 2019-10-03 LAB — LIPASE, BLOOD: Lipase: 24 U/L (ref 11–51)

## 2019-10-03 LAB — ETHANOL: Alcohol, Ethyl (B): <10 mg/dL (ref <10)

## 2019-10-03 LAB — APTT: aPTT: 38 seconds — ABNORMAL HIGH (ref 24–36)

## 2019-10-03 MED ORDER — ALBUTEROL SULFATE (2.5 MG/3ML) 0.083% IN NEBU: 3.0000 mL | INHALATION_SOLUTION | Freq: Four times a day (QID) | RESPIRATORY_TRACT | Status: DC | PRN

## 2019-10-03 MED ORDER — NITROGLYCERIN 0.4 MG SL SUBL
0.4000 mg | SUBLINGUAL_TABLET | SUBLINGUAL | Status: DC | PRN
  Administered 2019-10-03 (×2): 0.4 mg via SUBLINGUAL
  Filled 2019-10-03 (×2): qty 1

## 2019-10-03 MED ORDER — HEPARIN (PORCINE) 25000 UT/250ML-% IV SOLN
1000.0000 [IU]/h | INTRAVENOUS | Status: DC
  Administered 2019-10-03: 850 [IU]/h via INTRAVENOUS
  Administered 2019-10-04: 1000 [IU]/h via INTRAVENOUS
  Filled 2019-10-03 (×2): qty 250

## 2019-10-03 MED ORDER — ASPIRIN 300 MG RE SUPP: 300.0000 mg | RECTAL | Status: AC

## 2019-10-03 MED ORDER — ACETAMINOPHEN 325 MG PO TABS
650.0000 mg | ORAL_TABLET | ORAL | Status: DC | PRN
  Administered 2019-10-05 – 2019-10-16 (×6): 650 mg via ORAL
  Filled 2019-10-03 (×6): qty 2

## 2019-10-03 MED ORDER — FAMOTIDINE 20 MG PO TABS
10.0000 mg | ORAL_TABLET | Freq: Two times a day (BID) | ORAL | Status: DC
  Administered 2019-10-03 – 2019-10-30 (×54): 10 mg via ORAL
  Filled 2019-10-03 (×54): qty 1

## 2019-10-03 MED ORDER — HEPARIN BOLUS VIA INFUSION
4000.0000 [IU] | Freq: Once | INTRAVENOUS | Status: AC
  Administered 2019-10-03: 4000 [IU] via INTRAVENOUS
  Filled 2019-10-03: qty 4000

## 2019-10-03 MED ORDER — ONDANSETRON HCL 4 MG/2ML IJ SOLN
4.0000 mg | Freq: Four times a day (QID) | INTRAMUSCULAR | Status: DC | PRN
  Administered 2019-10-12 – 2019-10-28 (×18): 4 mg via INTRAVENOUS
  Filled 2019-10-03 (×19): qty 2

## 2019-10-03 MED ORDER — LATANOPROST 0.005 % OP SOLN
1.0000 [drp] | Freq: Every day | OPHTHALMIC | Status: DC
  Administered 2019-10-05 – 2019-10-29 (×20): 1 [drp] via OPHTHALMIC
  Filled 2019-10-03 (×2): qty 2.5

## 2019-10-03 MED ORDER — HEPARIN SODIUM (PORCINE) 5000 UNIT/ML IJ SOLN: 60.0000 [IU]/kg | Freq: Once | INTRAMUSCULAR | Status: DC

## 2019-10-03 MED ORDER — SODIUM CHLORIDE 0.9 % IV SOLN
INTRAVENOUS | Status: DC
  Administered 2019-10-04 – 2019-10-05 (×2): via INTRAVENOUS

## 2019-10-03 MED ORDER — QUETIAPINE FUMARATE 25 MG PO TABS
25.0000 mg | ORAL_TABLET | Freq: Three times a day (TID) | ORAL | Status: DC
  Administered 2019-10-03 – 2019-10-06 (×10): 25 mg via ORAL
  Filled 2019-10-03 (×11): qty 1

## 2019-10-03 MED ORDER — OLANZAPINE 10 MG PO TABS
20.0000 mg | ORAL_TABLET | Freq: Every day | ORAL | Status: DC
  Administered 2019-10-03 – 2019-10-29 (×27): 20 mg via ORAL
  Filled 2019-10-03 (×28): qty 2

## 2019-10-03 MED ORDER — LOXAPINE SUCCINATE 5 MG PO CAPS
25.0000 mg | ORAL_CAPSULE | Freq: Three times a day (TID) | ORAL | Status: DC
  Administered 2019-10-03 – 2019-10-16 (×39): 25 mg via ORAL
  Filled 2019-10-03 (×44): qty 1

## 2019-10-03 MED ORDER — ASPIRIN 81 MG PO CHEW: 324.0000 mg | CHEWABLE_TABLET | Freq: Once | ORAL | Status: DC

## 2019-10-03 MED ORDER — ALPRAZOLAM 0.25 MG PO TABS
0.2500 mg | ORAL_TABLET | Freq: Two times a day (BID) | ORAL | Status: DC | PRN
  Administered 2019-10-03 – 2019-10-05 (×4): 0.25 mg via ORAL
  Filled 2019-10-03 (×4): qty 1

## 2019-10-03 MED ORDER — ZOLPIDEM TARTRATE 5 MG PO TABS
5.0000 mg | ORAL_TABLET | Freq: Every evening | ORAL | Status: DC | PRN
  Administered 2019-10-05 – 2019-10-28 (×12): 5 mg via ORAL
  Filled 2019-10-03 (×13): qty 1

## 2019-10-03 MED ORDER — LEVETIRACETAM 500 MG PO TABS
1000.0000 mg | ORAL_TABLET | Freq: Two times a day (BID) | ORAL | Status: DC
  Administered 2019-10-03 – 2019-10-30 (×55): 1000 mg via ORAL
  Filled 2019-10-03 (×55): qty 2

## 2019-10-03 MED ORDER — VITAMIN D 25 MCG (1000 UNIT) PO TABS
2000.0000 [IU] | ORAL_TABLET | Freq: Every day | ORAL | Status: DC
  Administered 2019-10-04 – 2019-10-30 (×27): 2000 [IU] via ORAL
  Filled 2019-10-03 (×47): qty 2

## 2019-10-03 MED ORDER — NITROGLYCERIN 2 % TD OINT
1.0000 [in_us] | TOPICAL_OINTMENT | Freq: Four times a day (QID) | TRANSDERMAL | Status: DC
  Administered 2019-10-03 – 2019-10-05 (×5): 1 [in_us] via TOPICAL
  Filled 2019-10-03 (×5): qty 1

## 2019-10-03 MED ORDER — METOPROLOL TARTRATE 25 MG PO TABS
12.5000 mg | ORAL_TABLET | Freq: Two times a day (BID) | ORAL | Status: DC
  Administered 2019-10-04 – 2019-10-30 (×46): 12.5 mg via ORAL
  Filled 2019-10-03 (×51): qty 1

## 2019-10-03 MED ORDER — HEPARIN (PORCINE) 25000 UT/250ML-% IV SOLN: 14.0000 [IU]/kg/h | INTRAVENOUS | Status: DC

## 2019-10-03 MED ORDER — ATORVASTATIN CALCIUM 20 MG PO TABS
40.0000 mg | ORAL_TABLET | Freq: Every day | ORAL | Status: DC
  Administered 2019-10-03 – 2019-10-29 (×27): 40 mg via ORAL
  Filled 2019-10-03 (×28): qty 2

## 2019-10-03 MED ORDER — HEPARIN BOLUS VIA INFUSION
1000.0000 [IU] | Freq: Once | INTRAVENOUS | Status: AC
  Administered 2019-10-03: 1000 [IU] via INTRAVENOUS
  Filled 2019-10-03: qty 1000

## 2019-10-03 MED ORDER — ASPIRIN 81 MG PO CHEW: 324.0000 mg | CHEWABLE_TABLET | ORAL | Status: AC

## 2019-10-03 MED ORDER — ASPIRIN EC 81 MG PO TBEC
81.0000 mg | DELAYED_RELEASE_TABLET | Freq: Every day | ORAL | Status: DC
  Administered 2019-10-04 – 2019-10-30 (×27): 81 mg via ORAL
  Filled 2019-10-03 (×27): qty 1

## 2019-10-03 MED ORDER — ONDANSETRON 4 MG PO TBDP
ORAL_TABLET | ORAL | Status: AC
  Administered 2019-10-03: 4 mg
  Filled 2019-10-03: qty 1

## 2019-10-03 MED ORDER — ASPIRIN EC 81 MG PO TBEC: 81.0000 mg | DELAYED_RELEASE_TABLET | Freq: Every day | ORAL | Status: DC

## 2019-10-03 NOTE — ED Notes (Signed)
Date and time results received: 10/03/19 02:59 (use smartphrase ".now" to insert current time)  Test: troponin  Critical Value: 150  Name of Provider Notified: Dr. Beather Arbour  Orders Received? Or Actions Taken?: acknowledged

## 2019-10-03 NOTE — ED Notes (Signed)
Patient assisted with the urinal

## 2019-10-03 NOTE — Progress Notes (Signed)
ANTICOAGULATION CONSULT NOTE - Follow up [place] for Heparin Indication: chest pain/ACS  Allergies  Allergen Reactions  . Paxil [Paroxetine Hcl] Other (See Comments)    Told by MD to discontinue use  . Penicillins Other (See Comments)    Family history of allergies; patient's sister took once and died as a result (FYI).  Amoxicillin is ok  . Tramadol Other (See Comments)    Seizure disorder.  Caused seizure.  . Depakote [Divalproex Sodium] Hives and Swelling  . Acyclovir And Related     Patient Measurements: Height: 5\' 7"  (170.2 cm) Weight: 156 lb 1.4 oz (70.8 kg) IBW/kg (Calculated) : 66.1 HEPARIN DW (KG): 70.8  Vital Signs: BP: 97/83 (11/15 1000) Pulse Rate: 83 (11/15 1000)  Labs: Recent Labs    10/03/19 0141 10/03/19 0345 10/03/19 1313  HGB 10.9*  --   --   HCT 32.7*  --   --   PLT 223  --   --   APTT  --  38*  --   LABPROT  --  14.6  --   INR  --  1.2  --   HEPARINUNFRC  --   --  1.03*  CREATININE 0.82  --   --   TROPONINIHS 150* 123*  --     Estimated Creatinine Clearance: 85.1 mL/min (by C-G formula based on SCr of 0.82 mg/dL).    Assessment: 63 yo male here with chest pain. Pharmacy asked to initiate and monitor Heparin per protocol.  Baseline labs ordered.  Pt not on anticoagulants PTA per med list.    Heparin bolus 4000 units x 1 then 850 units/hr 11/15 1313 HL 1.03 - RN confirms heparin running at 8.5 ml/hr, RN said level was drawn out of IV line with heparin drip paused, flushed, and blood wasted   Goal of Therapy:  Heparin level 0.3-0.7 units/ml Monitor platelets by anticoagulation protocol: Yes   Plan:  Pt on ~12 units/kg/hr, level higher than expected, drawn from line Will order another heparin level to confirm level - RN said she will stick pt this time  Pharmacy will continue to follow.   Rayna Sexton L 10/03/2019,1:52 PM

## 2019-10-03 NOTE — Progress Notes (Signed)
No charge note  Patient seen and examined this morning, admitted by Dr. Sidney Ace, H&P reviewed and agree with the assessment and plan  This is a 63 year old male with COPD, bipolar disorder, schizoaffective disorder, depression, hypertension, seizure disorder who came in with sudden onset of left chest pain/tightness, graded 10/10 in severity.  He denies any similar pain in the past.  On my evaluation this morning he says that he continues to have chest pain, it somewhat positional and worse with movement.  Chest x-ray on admission showed no acute cardiopulmonary disease.  EKG is nonischemic.  His troponin was positive on admission and he was started on heparin for NSTEMI and cardiology was consulted by admitting physician   Chest pain, suspect for non-STEMI -Continue IV heparin as started on admission, however chest pain is reproducible with palpation and somewhat positional.  Cardiology to see -On aspirin, statin  Hypertension -Continue metoprolol  Bipolar disorder/schizoaffective disorder/history of seizure disorder -Continue home medications  BPH -Continue Flomax    Scheduled Meds: . aspirin EC  81 mg Oral Daily  . atorvastatin  40 mg Oral q1800  . cholecalciferol  2,000 Units Oral Daily  . famotidine  10 mg Oral BID  . latanoprost  1 drop Both Eyes QHS  . levETIRAcetam  1,000 mg Oral BID  . loxapine  25 mg Oral TID  . metoprolol tartrate  12.5 mg Oral BID  . nitroGLYCERIN  1 inch Topical Q6H  . OLANZapine  20 mg Oral QHS  . QUEtiapine  25 mg Oral TID   Continuous Infusions: . sodium chloride    . heparin 850 Units/hr (10/03/19 0349)   PRN Meds:.acetaminophen, albuterol, ALPRAZolam, nitroGLYCERIN, ondansetron (ZOFRAN) IV, zolpidem

## 2019-10-03 NOTE — ED Notes (Signed)
Condom catheter placed on patient.

## 2019-10-03 NOTE — ED Triage Notes (Addendum)
Patient brought in by ems from [place] with left side chest pain and shortness breath that started this morning.  Patient was given 324 mg asa by ems.

## 2019-10-03 NOTE — ED Notes (Signed)
Pt questioned about his chest pain and pt stated it "feels better, it just hurts a little bit". Pt is very anxious and stating that he is afraid he is going to have a heart attack and die. Pt reassured that he is doing well and that we are taking care of him. Pt's vital are WDL and is currently in NSR on the monitor.

## 2019-10-03 NOTE — ED Notes (Signed)
Pt cleaned up after incontinent episode. Pt placed in new gown with new bed linens. Condom Cath replaced as well.

## 2019-10-03 NOTE — ED Notes (Signed)
Pt calling out multiple times for water. Pt also called out for his "medicine that he takes in the morning". Pt informed RN waiting for medications to be received from pharmacy.

## 2019-10-03 NOTE — Progress Notes (Signed)
ANTICOAGULATION CONSULT NOTE - Follow up Jason Patterson for Heparin Indication: chest pain/ACS  Allergies  Allergen Reactions  . Paxil [Paroxetine Hcl] Other (See Comments)    Told by MD to discontinue use  . Penicillins Other (See Comments)    Family history of allergies; patient's sister took once and died as a result (FYI).  Amoxicillin is ok  . Tramadol Other (See Comments)    Seizure disorder.  Caused seizure.  . Depakote [Divalproex Sodium] Hives and Swelling  . Acyclovir And Related     Patient Measurements: Height: 5\' 7"  (170.2 cm) Weight: 156 lb 1.4 oz (70.8 kg) IBW/kg (Calculated) : 66.1 HEPARIN DW (KG): 70.8  Vital Signs: BP: 97/83 (11/15 1000) Pulse Rate: 83 (11/15 1000)  Labs: Recent Labs    10/03/19 0141 10/03/19 0345 10/03/19 1313 10/03/19 1407  HGB 10.9*  --   --   --   HCT 32.7*  --   --   --   PLT 223  --   --   --   APTT  --  38*  --   --   LABPROT  --  14.6  --   --   INR  --  1.2  --   --   HEPARINUNFRC  --   --  1.03* 0.32  CREATININE 0.82  --   --   --   TROPONINIHS 150* 123*  --   --     Estimated Creatinine Clearance: 85.1 mL/min (by C-G formula based on SCr of 0.82 mg/dL).    Assessment: 63 yo male here with chest pain. Pharmacy asked to initiate and monitor Heparin per protocol.  Baseline labs ordered.  Pt not on anticoagulants PTA per med list.    Heparin bolus 4000 units x 1 then 850 units/hr 11/15 1313 HL 1.03 - RN confirms heparin running at 8.5 ml/hr, RN said level was drawn out of IV line with heparin drip paused, flushed, and blood wasted 11/15 1407 HL 0.32, therapeutic x1  Goal of Therapy:  Heparin level 0.3-0.7 units/ml Monitor platelets by anticoagulation protocol: Yes   Plan:  Continue heparin drip at current rate Next HL in 6h to confirm  CBC in AM  Pharmacy will continue to follow.   Rayna Sexton L 10/03/2019,3:18 PM

## 2019-10-03 NOTE — ED Notes (Signed)
Patient assisted with urinal   

## 2019-10-03 NOTE — Progress Notes (Signed)
ANTICOAGULATION CONSULT NOTE - Follow up [facility] [place] for Heparin Indication: chest pain/ACS  Allergies  Allergen Reactions  . Paxil [Paroxetine Hcl] Other (See Comments)    Told by MD to discontinue use  . Penicillins Other (See Comments)    Family history of allergies; patient's sister took once and died as a result (FYI).  Amoxicillin is ok  . Tramadol Other (See Comments)    Seizure disorder.  Caused seizure.  . Depakote [Divalproex Sodium] Hives and Swelling  . Acyclovir And Related     Patient Measurements: Height: 5\' 7"  (170.2 cm) Weight: 156 lb 1.4 oz (70.8 kg) IBW/kg (Calculated) : 66.1 HEPARIN DW (KG): 70.8  Vital Signs: BP: 107/68 (11/15 2030) Pulse Rate: 84 (11/15 2030)  Labs: Recent Labs    10/03/19 0141 10/03/19 0345 10/03/19 1313 10/03/19 1407 10/03/19 2004  HGB 10.9*  --   --   --  9.0*  HCT 32.7*  --   --   --  26.0*  PLT 223  --   --   --  198  APTT  --  38*  --   --   --   LABPROT  --  14.6  --   --  14.8  INR  --  1.2  --   --  1.2  HEPARINUNFRC  --   --  1.03* 0.32 0.23*  CREATININE 0.82  --   --   --  0.72  TROPONINIHS 150* 123*  --   --   --     Estimated Creatinine Clearance: 87.2 mL/min (by C-G formula based on SCr of 0.72 mg/dL).    Assessment: 63 yo male here with chest pain. Pharmacy asked to initiate and monitor Heparin per protocol.  Baseline labs ordered.  Pt not on anticoagulants PTA per med list.    Heparin bolus 4000 units x 1 then 850 units/hr 11/15 1313 HL 1.03 - RN confirms heparin running at 8.5 ml/hr, RN said level was drawn out of IV line with heparin drip paused, flushed, and blood wasted 11/15 1407 HL 0.32, therapeutic x1 11/15 2004 HL 0.23 - subtherapeutic @ 850 units/hr  Goal of Therapy:  Heparin level 0.3-0.7 units/ml Monitor platelets by anticoagulation protocol: Yes   Plan:  Will bolus 1000 units and increase heparin drip to 1000 units/hr Next HL in 6h to confirm  CBC in AM  Pharmacy will  continue to follow.   Lu Duffel, PharmD, BCPS Clinical Pharmacist 10/03/2019 9:52 PM
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
## 2019-10-03 NOTE — ED Notes (Signed)
Patient given water at this time.  

## 2019-10-03 NOTE — ED Notes (Signed)
Pt continues to call out for water. Pt given ice chips per EDP to attempt to slow down water intake and maintain electrolytes. Pt upset that he can't have any water. This RN explained why it was necessary to limit/balance his water consumption. Pt told RN to throw the ice chips in the trash.

## 2019-10-03 NOTE — Progress Notes (Signed)
ANTICOAGULATION CONSULT NOTE - Initial Consult  Pharmacy Consult for Heparin Indication: chest pain/ACS  Allergies  Allergen Reactions  . Paxil [Paroxetine Hcl] Other (See Comments)    Told by MD to discontinue use  . Penicillins Other (See Comments)    Family history of allergies; patient's sister took once and died as a result (FYI).  Amoxicillin is ok  . Tramadol Other (See Comments)    Seizure disorder.  Caused seizure.  . Depakote [Divalproex Sodium] Hives and Swelling  . Acyclovir And Related     Patient Measurements: Height: 5\' 7"  (170.2 cm) Weight: 156 lb 1.4 oz (70.8 kg) IBW/kg (Calculated) : 66.1 HEPARIN DW (KG): 70.8  Vital Signs: Temp: 98.5 F (36.9 C) (11/15 0142) Temp Source: Oral (11/15 0142) BP: 95/48 (11/15 0430) Pulse Rate: 78 (11/15 0430)  Labs: Recent Labs    10/03/19 0141 10/03/19 0345  HGB 10.9*  --   HCT 32.7*  --   PLT 223  --   APTT  --  38*  LABPROT  --  14.6  INR  --  1.2  CREATININE 0.82  --   TROPONINIHS 150*  --     Estimated Creatinine Clearance: 85.1 mL/min (by C-G formula based on SCr of 0.82 mg/dL).   Medical History: Past Medical History:  Diagnosis Date  . Anxiety   . Bipolar disorder, unspecified ([place])   . Coarse tremors   . COPD (chronic obstructive pulmonary disease) ([place])   . Depression   . Diastolic dysfunction   . Dysrhythmia   . Essential hypertension, benign   . Generalized anxiety disorder   . GERD (gastroesophageal reflux disease)   . Headache(784.0)   . Physical deconditioning   . Psoriasis   . Rhabdomyolysis   . Schizoaffective disorder, unspecified condition   . Seizure disorder ([place])   . Seizures ([place])    NONE SINCE AGE 26  . Sepsis ([place])   . Shortness of breath    W/ EXERTION     Medications:  (Not in a hospital admission)   Assessment: Pharmacy asked to initiate and monitor Heparin per protocol.  Baseline labs ordered.  Pt not on anticoagulants PTA per med list.    Goal of Therapy:   Heparin level 0.3-0.7 units/ml Monitor platelets by anticoagulation protocol: Yes   Plan:  Heparin bolus 4000 units x 1 then 850 units/hr Check HL 6 hours after heparin started  Hart Robinsons A 10/03/2019,4:58 AM
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
## 2019-10-04 ENCOUNTER — Inpatient Hospital Stay
Admit: 2019-10-04 | Discharge: 2019-10-04 | Disposition: A | Discharge status: Home / Self Care | Payer: Medicaid Other | Attending: Family Medicine | Admitting: Family Medicine

## 2019-10-04 ENCOUNTER — Other Ambulatory Visit
Admission: RE | Admit: 2019-10-04 | Discharge: 2019-10-04 | Disposition: A | Discharge status: Home / Self Care | Payer: Medicaid Other | Source: Ambulatory Visit | Attending: Psychiatry | Admitting: Psychiatry

## 2019-10-04 ENCOUNTER — Inpatient Hospital Stay: Payer: Medicaid Other

## 2019-10-04 ENCOUNTER — Encounter: Payer: Self-pay | Admitting: Radiology

## 2019-10-04 DIAGNOSIS — R778 Other specified abnormalities of plasma proteins: Secondary | ICD-10-CM

## 2019-10-04 DIAGNOSIS — R0789 Other chest pain: Principal | ICD-10-CM

## 2019-10-04 DIAGNOSIS — I214 Non-ST elevation (NSTEMI) myocardial infarction: Secondary | ICD-10-CM

## 2019-10-04 LAB — HEPARIN LEVEL (UNFRACTIONATED): Heparin Unfractionated: 0.47 IU/mL (ref 0.30–0.70)

## 2019-10-04 LAB — CBC
HCT: 25.3 % — ABNORMAL LOW (ref 39.0–52.0)
Hemoglobin: 9 g/dL — ABNORMAL LOW (ref 13.0–17.0)
MCH: 32.1 pg (ref 26.0–34.0)
MCHC: 35.6 g/dL (ref 30.0–36.0)
MCV: 90.4 fL (ref 80.0–100.0)
Platelets: 169 10*3/uL (ref 150–400)
RBC: 2.8 MIL/uL — ABNORMAL LOW (ref 4.22–5.81)
RDW: 14.3 % (ref 11.5–15.5)
WBC: 9.7 10*3/uL (ref 4.0–10.5)
nRBC: 0 % (ref 0.0–0.2)

## 2019-10-04 LAB — COMPREHENSIVE METABOLIC PANEL
ALT: 11 U/L (ref 0–44)
AST: 17 U/L (ref 15–41)
Albumin: 3.1 g/dL — ABNORMAL LOW (ref 3.5–5.0)
Alkaline Phosphatase: 54 U/L (ref 38–126)
Anion gap: 8 (ref 5–15)
BUN: 15 mg/dL (ref 8–23)
CO2: 22 mmol/L (ref 22–32)
Calcium: 8.3 mg/dL — ABNORMAL LOW (ref 8.9–10.3)
Chloride: 103 mmol/L (ref 98–111)
Creatinine, Ser: 0.71 mg/dL (ref 0.61–1.24)
GFR calc Af Amer: >60 mL/min (ref >60)
GFR calc non Af Amer: >60 mL/min (ref >60)
Glucose, Bld: 126 mg/dL — ABNORMAL HIGH (ref 70–99)
Potassium: 3.4 mmol/L — ABNORMAL LOW (ref 3.5–5.1)
Sodium: 133 mmol/L — ABNORMAL LOW (ref 135–145)
Total Bilirubin: 0.6 mg/dL (ref 0.3–1.2)
Total Protein: 5.4 g/dL — ABNORMAL LOW (ref 6.5–8.1)

## 2019-10-04 LAB — ECHOCARDIOGRAM COMPLETE
Height: 67 in
Weight: 2497.37 oz

## 2019-10-04 LAB — FIBRIN DERIVATIVES D-DIMER (ARMC ONLY): Fibrin derivatives D-dimer (ARMC): 633.53 ng/mL (FEU) — ABNORMAL HIGH (ref 0.00–499.00)

## 2019-10-04 MED ORDER — PNEUMOCOCCAL VAC POLYVALENT 25 MCG/0.5ML IJ INJ
0.5000 mL | INJECTION | INTRAMUSCULAR | Status: AC
  Administered 2019-10-05: 0.5 mL via INTRAMUSCULAR
  Filled 2019-10-04 (×2): qty 0.5

## 2019-10-04 MED ORDER — KETOROLAC TROMETHAMINE 30 MG/ML IJ SOLN
15.0000 mg | Freq: Four times a day (QID) | INTRAMUSCULAR | Status: AC | PRN
  Administered 2019-10-04 (×2): 15 mg via INTRAVENOUS
  Filled 2019-10-04 (×2): qty 1

## 2019-10-04 MED ORDER — IOHEXOL 350 MG/ML SOLN
75.0000 mL | Freq: Once | INTRAVENOUS | Status: AC | PRN
  Administered 2019-10-04: 75 mL via INTRAVENOUS
  Filled 2019-10-04: qty 75

## 2019-10-04 MED ORDER — INFLUENZA VAC SPLIT QUAD 0.5 ML IM SUSY
0.5000 mL | PREFILLED_SYRINGE | INTRAMUSCULAR | Status: AC
  Administered 2019-10-05: 0.5 mL via INTRAMUSCULAR
  Filled 2019-10-04: qty 0.5

## 2019-10-04 NOTE — ED Notes (Signed)
Patient assisted with bedpan.

## 2019-10-04 NOTE — Progress Notes (Signed)
Patient Name: Jason Patterson Date of Encounter: 10/04/2019  Hospital Problem List     Active Problems:   Elevated troponin    Patient Profile      Izick Gasbarro is a 63 y.o. male with history of COPD, bipolar disorder, schizoaffective disorder, hypertension and seizure disorder who presented to the emergency room with complaints of left-sided chest pain.  Felt to be superior greater than 10 out of 10 with no radiation.  Electrocardiogram was unremarkable for acute injury or ischemia.  Showed sinus rhythm with nonspecific changes..    Serum high-sensitivity troponin was 150.  Chest x-ray revealed no acute cardiopulmonary disease.  Patient was empirically placed on heparin.   Subjective   Continues to have extremely atypical chest pain.  Pain worsened with deep breathing, deep palpation, rolling from side to side, troponin is flat.  EKG does not show suggest ischemia.  Inpatient Medications    . aspirin EC  81 mg Oral Daily  . atorvastatin  40 mg Oral q1800  . cholecalciferol  2,000 Units Oral Daily  . famotidine  10 mg Oral BID  . latanoprost  1 drop Both Eyes QHS  . levETIRAcetam  1,000 mg Oral BID  . loxapine  25 mg Oral TID  . metoprolol tartrate  12.5 mg Oral BID  . nitroGLYCERIN  1 inch Topical Q6H  . OLANZapine  20 mg Oral QHS  . QUEtiapine  25 mg Oral TID    Vital Signs    Vitals:   10/04/19 0515 10/04/19 0600 10/04/19 0630 10/04/19 0730  BP: 100/62 101/64 109/69 115/80  Pulse: 78 77    Resp: (!) 32 (!) 21 (!) 31 (!) 21  Temp:      TempSrc:      SpO2: 100% 100%    Weight:      Height:        Intake/Output Summary (Last 24 hours) at 10/04/2019 0826 Last data filed at 10/04/2019 0230 Gross per 24 hour  Intake -  Output 1000 ml  Net -1000 ml   Filed Weights   10/03/19 0300  Weight: 70.8 kg    Physical Exam    GEN: Well nourished, well developed, in no acute distress.  HEENT: normal.  Neck: Supple, no JVD, carotid bruits, or  masses. Cardiac: RRR, no murmurs, rubs, or gallops. No clubbing, cyanosis, edema.  Radials/DP/PT 2+ and equal bilaterally.  Respiratory:  Respirations regular and unlabored, clear to auscultation bilaterally. GI: Soft, nontender, nondistended, BS + x 4. MS: no deformity or atrophy. Skin: warm and dry, no rash. Neuro:  Strength and sensation are intact. Psych: Normal affect.  Labs    CBC Recent Labs    10/03/19 0141 10/03/19 2004 10/04/19 0407  WBC 13.0* 9.8 9.7  NEUTROABS 10.2*  --   --   HGB 10.9* 9.0* 9.0*  HCT 32.7* 26.0* 25.3*  MCV 94.5 91.2 90.4  PLT 223 198 169   Basic Metabolic Panel Recent Labs    11/26/30 2004 10/04/19 0407  NA 135 133*  K 3.1* 3.4*  CL 104 103  CO2 23 22  GLUCOSE 131* 126*  BUN 19 15  CREATININE 0.72 0.71  CALCIUM 8.5* 8.3*   Liver Function Tests Recent Labs    10/03/19 0141 10/04/19 0407  AST 18 17  ALT 14 11  ALKPHOS 60 54  BILITOT 0.6 0.6  PROT 6.0* 5.4*  ALBUMIN 3.4* 3.1*   Recent Labs    10/03/19 0141  LIPASE 24  Cardiac Enzymes No results for input(s): CKTOTAL, CKMB, CKMBINDEX, TROPONINI in the last 72 hours. BNP No results for input(s): BNP in the last 72 hours. D-Dimer No results for input(s): DDIMER in the last 72 hours. Hemoglobin A1C No results for input(s): HGBA1C in the last 72 hours. Fasting Lipid Panel Recent Labs    10/03/19 2004  CHOL 99  HDL 31*  LDLCALC 53  TRIG 77  CHOLHDL 3.2   Thyroid Function Tests No results for input(s): TSH, T4TOTAL, T3FREE, THYROIDAB in the last 72 hours.  Invalid input(s): FREET3  Telemetry    Sinus rhythm  ECG    Normal sinus rhythm with no ischemia.  Radiology    Dg Chest Port 1 View  Result Date: 10/03/2019 CLINICAL DATA:  Chest pain EXAM: PORTABLE CHEST 1 VIEW COMPARISON:  02/08/2019 FINDINGS: Heart and mediastinal contours are within normal limits. No focal opacities or effusions. No acute bony abnormality. Old healed left rib fractures. IMPRESSION:  No active disease. Electronically Signed   By: Rolm Baptise M.D.   On: 10/03/2019 01:57    Assessment & Plan    63 y.o. male with history of COPD, bipolar disorder, schizoaffective disorder, hypertension and seizure disorder who presented to the emergency room with complaints of left-sided chest pain.  Felt to be superior greater than 10 out of 10 with no radiation.  Electrocardiogram was unremarkable for acute injury or ischemia.  Showed sinus rhythm with nonspecific changes..    Serum high-sensitivity troponin was 150.  Chest x-ray revealed no acute cardiopulmonary disease.  1. Chest pain-very atypical features.  Echocardiogram 4 years ago revealed preserved LV function.  Echo from this admission pending. Chest CT with contrast revealed no pulmonary embolus with moderate emphysema.  There was scattered coronary calcifications.  No aneurysm.  No dissection.  Currently on heparin.  Does not meet troponin qualification of a non-ST elevation myocardial infarction.  Elevated troponin is likely multifactorial.  Will review echo when available to determine if there is any regional wall motion abnormality or pericardial abnormalities.    Will discontinue heparin and attempt medical management.  Not an ideal candidate for invasive evaluation.  Symptoms are extremely atypical for acute coronary syndrome.  2. Complaint of diarrhea.  On Imodium as needed.  3. Hypertension-continue with Lopressor at 12.5 mg twice daily, blood pressure is controlled at present was 115/80 this morning.  Heart rate was 77.  Low-sodium diet is recommended.  4. Tobacco abuse-smoking cessation discussed.  5. Schizoaffective/bipolar disorder. - Is on Abilify and Seroquel.  We will continue with this for now and follow-up for decompensation.  6. eizure disorder-on Keppra.  No evidence of seizure activity  7. Anxiety.  Anxiolytics as needed.  Signed, Javier Docker Shawndell Varas MD 10/04/2019, 8:26 AM  Pager: [phone]

## 2019-10-04 NOTE — ED Notes (Signed)
Pt provided with kleenex.

## 2019-10-04 NOTE — Progress Notes (Signed)
ANTICOAGULATION CONSULT NOTE - Follow up [facility] for Heparin Indication: chest pain/ACS  Allergies  Allergen Reactions  . Paxil [Paroxetine Hcl] Other (See Comments)    Told by MD to discontinue use  . Penicillins Other (See Comments)    Family history of allergies; patient's sister took once and died as a result (FYI).  Amoxicillin is ok  . Tramadol Other (See Comments)    Seizure disorder.  Caused seizure.  . Depakote [Divalproex Sodium] Hives and Swelling  . Acyclovir And Related     Patient Measurements: Height: 5\' 7"  (170.2 cm) Weight: 156 lb 1.4 oz (70.8 kg) IBW/kg (Calculated) : 66.1 HEPARIN DW (KG): 70.8  Vital Signs: BP: 115/80 (11/16 0730) Pulse Rate: 77 (11/16 0600)  Labs: Recent Labs    10/03/19 0141 10/03/19 0345  10/03/19 1407 10/03/19 2004 10/04/19 0407  HGB 10.9*  --   --   --  9.0* 9.0*  HCT 32.7*  --   --   --  26.0* 25.3*  PLT 223  --   --   --  198 169  APTT  --  38*  --   --   --   --   LABPROT  --  14.6  --   --  14.8  --   INR  --  1.2  --   --  1.2  --   HEPARINUNFRC  --   --    < > 0.32 0.23* 0.47  CREATININE 0.82  --   --   --  0.72 0.71  TROPONINIHS 150* 123*  --   --   --   --    < > = values in this interval not displayed.    Estimated Creatinine Clearance: 87.2 mL/min (by C-G formula based on SCr of 0.71 mg/dL).    Assessment: 63 yo male here with chest pain. Pharmacy asked to initiate and monitor Heparin per protocol.  Baseline labs ordered.  Pt not on anticoagulants PTA per med list.    Heparin bolus 4000 units x 1 then 850 units/hr 11/15 1313 HL 1.03 - RN confirms heparin running at 8.5 ml/hr, RN said level was drawn out of IV line with heparin drip paused, flushed, and blood wasted 11/15 1407 HL 0.32, therapeutic x1 11/15 2004 HL 0.23 - subtherapeutic @ 850 units/hr. Will bolus 1000 units and increase heparin drip to 1000 units/hr 11/16 0407 HL 0.47   Goal of Therapy:  Heparin level 0.3-0.7  units/ml Monitor platelets by anticoagulation protocol: Yes   Plan:  11/16 0407 HL= 0.47. Will continue heparin drip at 1000 units/hr. Hgb 9.0  Plt 169 Next HL in 6h for confirmatory level CBC in AM  11/16 0835 addendum:  Heparin drip now discontinued per cardiology  Pharmacy will continue to follow.   Noralee Space, PharmD, BCPS Clinical Pharmacist 10/04/2019 8:04 AM

## 2019-10-04 NOTE — ED Notes (Signed)
Pt assisted onto bedpan for possible bm.

## 2019-10-04 NOTE — ED Notes (Signed)
Patient given a warm blanket at this time.  

## 2019-10-04 NOTE — ED Notes (Signed)
Pt ringing call bell, states he thought he might have to have a BM, but no longer does. Pt denies other needs.

## 2019-10-04 NOTE — ED Notes (Signed)
Patient given water at this time.  

## 2019-10-04 NOTE — ED Notes (Signed)
Iv attempted x 2; iv team consult ordered. 

## 2019-10-04 NOTE — Progress Notes (Signed)
PROGRESS NOTE  Jason Patterson XNA:[id number] DOB: 03-Sep-1956 DOA: 10/03/2019 PCP: [name], NP   LOS: 1 day   Brief Narrative / Interim history:  63 year old male with COPD, bipolar disorder, schizoaffective disorder, depression, hypertension, seizure disorder who came in with sudden onset of left chest pain/tightness, graded 10/10 in severity.  He denies any similar pain in the past.  On my evaluation this morning he says that he continues to have chest pain, it somewhat positional and worse with movement.  Chest x-ray on admission showed no acute cardiopulmonary disease.  EKG is nonischemic.  His troponin was positive on admission and he was started on heparin for NSTEMI and cardiology was consulted by admitting physician  Subjective / 24h Interval events: Continued complaints of persistent chest pain this morning, he now has had pain over the last 48 hours.  Pain is positional and worse with inspiration as well as reproducible with palpation.  No nausea or vomiting.  Does complain of shortness of breath.  Assessment & Plan: Principal Problem Chest pain, initial suspect for non-STEMI -Cardiology following, discussed with Dr. Lady Gary, he did have elevated troponin but I agree that his chest pain does notsound ischemic, however he is a very poor historian. -2D echo is being done this morning, pending results cardiology will decide further testing -will obtain a D-dimer, if elevated will do a CT angiogram.  Chart review shows that he has had an ED visit about a year ago with chest pain shortness of breath, sounds similar, CT angiogram at that time was negative.  He was on heparin infusion but this was just discontinued this morning  Active Problems Hypertension -Continue metoprolol, blood pressure controlled this morning  Bipolar disorder/schizoaffective disorder/history of seizure disorder -Continue home medications  BPH -Continue Flomax  Tobacco use -will need to quit   Scheduled Meds: . aspirin EC  81 mg Oral Daily  . atorvastatin  40 mg Oral q1800  . cholecalciferol  2,000 Units Oral Daily  . famotidine  10 mg Oral BID  . latanoprost  1 drop Both Eyes QHS  . levETIRAcetam  1,000 mg Oral BID  . loxapine  25 mg Oral TID  . metoprolol tartrate  12.5 mg Oral BID  . nitroGLYCERIN  1 inch Topical Q6H  . OLANZapine  20 mg Oral QHS  . QUEtiapine  25 mg Oral TID   Continuous Infusions: . sodium chloride     PRN Meds:.acetaminophen, albuterol, ALPRAZolam, ketorolac, nitroGLYCERIN, ondansetron (ZOFRAN) IV, zolpidem  DVT prophylaxis: Heparin infusion Code Status: Full code Family Communication: Discussed with patient Disposition Plan: Home when ready  Consultants:  Cardiology   Procedures:  2D echo: pending  Microbiology  None   Antimicrobials: None     Objective: Vitals:   10/04/19 0730 10/04/19 0900 10/04/19 0920 10/04/19 1015  BP: 115/80 (!) 87/68 (!) 89/68 103/70  Pulse:  78 97 81  Resp: (!) 21 (!) 22    Temp:      TempSrc:      SpO2:  98% 100%   Weight:      Height:        Intake/Output Summary (Last 24 hours) at 10/04/2019 1046 Last data filed at 10/04/2019 0230 Gross per 24 hour  Intake -  Output 1000 ml  Net -1000 ml   Filed Weights   10/03/19 0300  Weight: 70.8 kg    Examination:  Constitutional: Appears anxious Eyes: lids and conjunctivae normal, no scleral icterus ENMT: Mucous membranes are moist.  Neck: normal,  supple Respiratory: clear to auscultation bilaterally, no wheezing, no crackles. Normal respiratory effort. Cardiovascular: Regular rate and rhythm, no murmurs / rubs / gallops. No LE edema.  Abdomen: no tenderness. Bowel sounds positive.  Musculoskeletal: no clubbing / cyanosis.  Skin: no rashes Neurologic: CN 2-12 grossly intact. Strength 5/5 in all 4.    Data Reviewed: I have independently reviewed following labs and imaging studies   CBC: Recent Labs  Lab 10/03/19 0141 10/03/19 2004  10/04/19 0407  WBC 13.0* 9.8 9.7  NEUTROABS 10.2*  --   --   HGB 10.9* 9.0* 9.0*  HCT 32.7* 26.0* 25.3*  MCV 94.5 91.2 90.4  PLT 223 198 144   Basic Metabolic Panel: Recent Labs  Lab 10/03/19 0141 10/03/19 2004 10/04/19 0407  NA 133* 135 133*  K 3.5 3.1* 3.4*  CL 104 104 103  CO2 21* 23 22  GLUCOSE 104* 131* 126*  BUN 34* 19 15  CREATININE 0.82 0.72 0.71  CALCIUM 8.5* 8.5* 8.3*   Liver Function Tests: Recent Labs  Lab 10/03/19 0141 10/04/19 0407  AST 18 17  ALT 14 11  ALKPHOS 60 54  BILITOT 0.6 0.6  PROT 6.0* 5.4*  ALBUMIN 3.4* 3.1*   Coagulation Profile: Recent Labs  Lab 10/03/19 0345 10/03/19 2004  INR 1.2 1.2   HbA1C: No results for input(s): HGBA1C in the last 72 hours. CBG: No results for input(s): GLUCAP in the last 168 hours.  Recent Results (from the past 240 hour(s))  SARS CORONAVIRUS 2 (TAT 6-24 HRS) Nasopharyngeal Nasopharyngeal Swab     Status: None   Collection Time: 10/03/19  1:48 AM   Specimen: Nasopharyngeal Swab  Result Value Ref Range Status   SARS Coronavirus 2 NEGATIVE NEGATIVE Final    Comment: (NOTE) SARS-CoV-2 target nucleic acids are NOT DETECTED. The SARS-CoV-2 RNA is generally detectable in upper and lower respiratory specimens during the acute phase of infection. Negative results do not preclude SARS-CoV-2 infection, do not rule out co-infections with other pathogens, and should not be used as the sole basis for treatment or other patient management decisions. Negative results must be combined with clinical observations, patient history, and epidemiological information. The expected result is Negative. Fact Sheet for Patients: SugarRoll.be Fact Sheet for Healthcare Providers: https://www.woods-mathews.com/ This test is not yet approved or cleared by the [place] FDA and  has been authorized for detection and/or diagnosis of SARS-CoV-2 by FDA under an Emergency Use  Authorization (EUA). This EUA will remain  in effect (meaning this test can be used) for the duration of the COVID-19 declaration under Section 56 4(b)(1) of the Act, 21 U.S.C. section 360bbb-3(b)(1), unless the authorization is terminated or revoked sooner. Performed at [facility], [place] [address] [place], [place] [address]      Radiology Studies: No results found.  Marzetta Board, MD, PhD [facility]  Between 7 am - 7 pm I am available, please contact me via Amion or Securechat  Between 7 pm - 7 am I am not available, please contact night coverage MD/APP via Amion

## 2019-10-04 NOTE — Progress Notes (Signed)
*  PRELIMINARY RESULTS* Echocardiogram 2D Echocardiogram has been performed.  Jason Patterson 10/04/2019, 9:07 AM

## 2019-10-04 NOTE — ED Notes (Signed)
Patient given water per MD ok

## 2019-10-04 NOTE — ED Notes (Signed)
Pt ringing call bell x2 to ensure he will receive a breakfast tray. Pt informed that yes he will receive a tray.

## 2019-10-05 LAB — BASIC METABOLIC PANEL
Anion gap: 8 (ref 5–15)
BUN: 12 mg/dL (ref 8–23)
CO2: 21 mmol/L — ABNORMAL LOW (ref 22–32)
Calcium: 8.3 mg/dL — ABNORMAL LOW (ref 8.9–10.3)
Chloride: 107 mmol/L (ref 98–111)
Creatinine, Ser: 0.85 mg/dL (ref 0.61–1.24)
GFR calc Af Amer: >60 mL/min (ref >60)
GFR calc non Af Amer: >60 mL/min (ref >60)
Glucose, Bld: 99 mg/dL (ref 70–99)
Potassium: 3.4 mmol/L — ABNORMAL LOW (ref 3.5–5.1)
Sodium: 136 mmol/L (ref 135–145)

## 2019-10-05 LAB — CBC
HCT: 21.8 % — ABNORMAL LOW (ref 39.0–52.0)
Hemoglobin: 7.6 g/dL — ABNORMAL LOW (ref 13.0–17.0)
MCH: 31.5 pg (ref 26.0–34.0)
MCHC: 34.9 g/dL (ref 30.0–36.0)
MCV: 90.5 fL (ref 80.0–100.0)
Platelets: 156 10*3/uL (ref 150–400)
RBC: 2.41 MIL/uL — ABNORMAL LOW (ref 4.22–5.81)
RDW: 14.2 % (ref 11.5–15.5)
WBC: 5.6 10*3/uL (ref 4.0–10.5)
nRBC: 0 % (ref 0.0–0.2)

## 2019-10-05 LAB — ABO/RH: ABO/RH(D): A POS

## 2019-10-05 LAB — PREPARE RBC (CROSSMATCH)

## 2019-10-05 MED ORDER — PANTOPRAZOLE SODIUM 40 MG IV SOLR
40.0000 mg | Freq: Two times a day (BID) | INTRAVENOUS | Status: DC
  Administered 2019-10-05 – 2019-10-08 (×5): 40 mg via INTRAVENOUS
  Filled 2019-10-05 (×6): qty 40

## 2019-10-05 MED ORDER — SODIUM CHLORIDE 0.9% IV SOLUTION
Freq: Once | INTRAVENOUS | Status: AC
  Administered 2019-10-05: 13:00:00 via INTRAVENOUS

## 2019-10-05 MED ORDER — SENNOSIDES-DOCUSATE SODIUM 8.6-50 MG PO TABS
2.0000 | ORAL_TABLET | Freq: Two times a day (BID) | ORAL | Status: DC
  Administered 2019-10-05 – 2019-10-30 (×26): 2 via ORAL
  Filled 2019-10-05 (×44): qty 2

## 2019-10-05 MED ORDER — SUCRALFATE 1 GM/10ML PO SUSP
1.0000 g | Freq: Three times a day (TID) | ORAL | Status: DC
  Administered 2019-10-05 – 2019-10-30 (×95): 1 g via ORAL
  Filled 2019-10-05 (×98): qty 10

## 2019-10-05 NOTE — Progress Notes (Signed)
PROGRESS NOTE  Jason Patterson [id number] DOB: 09-21-1956 DOA: 10/03/2019 PCP: Terrill Mohr, NP   LOS: 2 days   Brief Narrative / Interim history:  63 year old male with COPD, bipolar disorder, schizoaffective disorder, depression, hypertension, seizure disorder who came in with sudden onset of left chest pain/tightness, graded 10/10 in severity.  He denies any similar pain in the past.  On my evaluation this morning he says that he continues to have chest pain, it somewhat positional and worse with movement.  Chest x-ray on admission showed no acute cardiopulmonary disease.  EKG is nonischemic.  His troponin was positive on admission and he was started on heparin for NSTEMI and cardiology was consulted by admitting physician  Subjective / 24h Interval events: Chest pain is improving.  Patient feels like after the Nitropaste.  No nausea or vomiting.  No dysphagia or problems with eating  Assessment & Plan: Principal Problem Chest pain, initial suspect for non-STEMI now ruled out, potential esophagitis -Cardiology following, discussed with Dr. Ubaldo Glassing, he did have elevated troponin but I agree that his chest pain does notsound ischemic, however he is a very poor historian. -2D echo done showed normal EF 55-60%, -D-dimer was elevated and patient underwent a CT angiogram which was negative for PE however it showed circumferential esophageal wall thickening with reactive adenopathy suggestive of esophagitis.  Gastroenterology was consulted and upon evaluation on 11/17 patient refused any further GI work-up.  I have discussed he may be able to be discharged home but he does not wish to do so because his chest is still hurting.  He may need GI evaluation I explained that to the patient, and he is now agreeable to have gastroenterology evaluate the etiology of his chest pain.  I messaged Dr. Allen Norris  Active Problems Anemia -Of chronic disease, however his hemoglobin has dropped from 10.9 on  admission to 7.6 (along with all the other cell lines which may suggest dilutional component) however he was on a heparin drip for at least 24 hours.  With his CT findings, GI was consulted as above  Hypertension -Continue metoprolol, blood pressure controlled this morning  Bipolar disorder/schizoaffective disorder/history of seizure disorder -Continue home medications  BPH -Continue Flomax  Tobacco use -will need to quit   Scheduled Meds:  sodium chloride   Intravenous Once   aspirin EC  81 mg Oral Daily   atorvastatin  40 mg Oral q1800   cholecalciferol  2,000 Units Oral Daily   famotidine  10 mg Oral BID   latanoprost  1 drop Both Eyes QHS   levETIRAcetam  1,000 mg Oral BID   loxapine  25 mg Oral TID   metoprolol tartrate  12.5 mg Oral BID   OLANZapine  20 mg Oral QHS   pantoprazole (PROTONIX) IV  40 mg Intravenous Q12H   QUEtiapine  25 mg Oral TID   sucralfate  1 g Oral TID WC & HS   Continuous Infusions:  PRN Meds:.acetaminophen, albuterol, ALPRAZolam, ketorolac, nitroGLYCERIN, ondansetron (ZOFRAN) IV, zolpidem  DVT prophylaxis: Heparin infusion Code Status: Full code Family Communication: Discussed with patient and called patient's guardian Freda Munro [phone] Disposition Plan: Home when ready  Consultants:  Cardiology   Procedures:  2D echo: pending IMPRESSIONS    1. Left ventricular ejection fraction, by visual estimation, is 55 to 60%. The left ventricle has normal function. There is no left ventricular hypertrophy.  2. Global right ventricle has normal systolic function.The right ventricular size is normal. No increase in right ventricular wall thickness.  3. Left atrial size was normal.  4. Right atrial size was normal.  5. The mitral valve is normal in structure. Trace mitral valve regurgitation. No evidence of mitral stenosis.  6. The tricuspid valve is normal in structure. Tricuspid valve regurgitation is trivial.  7. The aortic  valve is normal in structure. Aortic valve regurgitation is not visualized. No evidence of aortic valve sclerosis or stenosis.  8. The pulmonic valve was normal in structure. Pulmonic valve regurgitation is not visualized.  9. The inferior vena cava is normal in size with greater than 50% respiratory variability, suggesting right atrial pressure of 3 mmHg.   Microbiology  None   Antimicrobials: None     Objective: Vitals:   10/04/19 1810 10/04/19 1938 10/05/19 0321 10/05/19 0857  BP: 106/78 111/62 94/68 (!) 99/51  Pulse: 79 81 66 67  Resp: 19 18 18    Temp: (!) 100.4 F (38 C) 98.2 F (36.8 C) 99.3 F (37.4 C) 99.1 F (37.3 C)  TempSrc: Oral Oral Oral Oral  SpO2: 97% 98% 97% 97%  Weight: 69.4 kg  69.9 kg   Height: 5\' 7"  (1.702 m)       Intake/Output Summary (Last 24 hours) at 10/05/2019 1247 Last data filed at 10/05/2019 1104 Gross per 24 hour  Intake --  Output 1701 ml  Net -1701 ml   Filed Weights   10/03/19 0300 10/04/19 1810 10/05/19 0321  Weight: 70.8 kg 69.4 kg 69.9 kg    Examination:  Constitutional: No distress, eating breakfast Eyes: No scleral icterus ENMT: Mucous membranes are moist.  Neck: normal, supple Respiratory: Clear bilaterally without wheezing or crackles, normal respiratory effort Cardiovascular: Regular rate and rhythm, no murmurs, no edema Abdomen: Soft, nontender, nondistended, bowel sounds positive Musculoskeletal: no clubbing / cyanosis.  Skin: No rashes seen Neurologic: No focal deficits, equal strength   Data Reviewed: I have independently reviewed following labs and imaging studies   CBC: Recent Labs  Lab 10/03/19 0141 10/03/19 2004 10/04/19 0407 10/05/19 0450  WBC 13.0* 9.8 9.7 5.6  NEUTROABS 10.2*  --   --   --   HGB 10.9* 9.0* 9.0* 7.6*  HCT 32.7* 26.0* 25.3* 21.8*  MCV 94.5 91.2 90.4 90.5  PLT 223 198 169 156   Basic Metabolic Panel: Recent Labs  Lab 10/03/19 0141 10/03/19 2004 10/04/19 0407 10/05/19 0450   NA 133* 135 133* 136  K 3.5 3.1* 3.4* 3.4*  CL 104 104 103 107  CO2 21* 23 22 21*  GLUCOSE 104* 131* 126* 99  BUN 34* 19 15 12   CREATININE 0.82 0.72 0.71 0.85  CALCIUM 8.5* 8.5* 8.3* 8.3*   Liver Function Tests: Recent Labs  Lab 10/03/19 0141 10/04/19 0407  AST 18 17  ALT 14 11  ALKPHOS 60 54  BILITOT 0.6 0.6  PROT 6.0* 5.4*  ALBUMIN 3.4* 3.1*   Coagulation Profile: Recent Labs  Lab 10/03/19 0345 10/03/19 2004  INR 1.2 1.2   HbA1C: No results for input(s): HGBA1C in the last 72 hours. CBG: No results for input(s): GLUCAP in the last 168 hours.  Recent Results (from the past 240 hour(s))  SARS CORONAVIRUS 2 (TAT 6-24 HRS) Nasopharyngeal Nasopharyngeal Swab     Status: None   Collection Time: 10/03/19  1:48 AM   Specimen: Nasopharyngeal Swab  Result Value Ref Range Status   SARS Coronavirus 2 NEGATIVE NEGATIVE Final    Comment: (NOTE) SARS-CoV-2 target nucleic acids are NOT DETECTED. The SARS-CoV-2 RNA is generally detectable in upper  and lower respiratory specimens during the acute phase of infection. Negative results do not preclude SARS-CoV-2 infection, do not rule out co-infections with other pathogens, and should not be used as the sole basis for treatment or other patient management decisions. Negative results must be combined with clinical observations, patient history, and epidemiological information. The expected result is Negative. Fact Sheet for Patients: HairSlick.no Fact Sheet for Healthcare Providers: quierodirigir.com This test is not yet approved or cleared by the [place] FDA and  has been authorized for detection and/or diagnosis of SARS-CoV-2 by FDA under an Emergency Use Authorization (EUA). This EUA will remain  in effect (meaning this test can be used) for the duration of the COVID-19 declaration under Section 56 4(b)(1) of the Act, 21 U.S.C. section 360bbb-3(b)(1), unless the  authorization is terminated or revoked sooner. Performed at [facility], [address], [place], [place] [address]      Radiology Studies: Ct Angio Chest Pe W Or Wo Contrast  Result Date: 10/04/2019 CLINICAL DATA:  Positive D-dimer, PE suspected, sudden onset left chest pain tightness, 10/10 in severity EXAM: CT ANGIOGRAPHY CHEST WITH CONTRAST TECHNIQUE: Multidetector CT imaging of the chest was performed using the standard protocol during bolus administration of intravenous contrast. Multiplanar CT image reconstructions and MIPs were obtained to evaluate the vascular anatomy. CONTRAST:  52mL OMNIPAQUE IOHEXOL 350 MG/ML SOLN COMPARISON:  CTA chest 08/27/2018 FINDINGS: Cardiovascular: Satisfactory opacification the pulmonary arteries to the segmental level. No pulmonary arterial filling defects are identified. Central pulmonary arteries are normal caliber. Flattening of the intraventricular septum and right cardiac enlargement is similar to comparison CTs. Coronary artery calcifications are present. The aorta is minimally calcified but otherwise unremarkable and normal caliber. Normal 3 vessel branching of the arch. Mediastinum/Nodes: There is circumferential esophageal wall thickening and a small to moderate hiatal hernia with a small amount of adjacent lower mediastinal low-attenuation fluid and reactive adenopathy. Diffuse secretions are seen throughout the airways and trachea. No pathologically enlarged mediastinal, hilar or axillary nodes. Thyroid gland and thoracic inlet are unremarkable. Lungs/Pleura: Diffuse paraseptal and centrilobular emphysematous changes are present throughout the lungs. Scattered calcified granulomata are noted. Evaluation for nodules and masses is somewhat limited due to respiratory motion artifact though no concerning focal abnormality is seen. There is diffuse airways thickening. No pneumothorax. No effusion. Upper Abdomen: No acute abnormalities present in the  visualized portions of the upper abdomen. Musculoskeletal: Remote posttraumatic deformity of the superior third sternum Multilevel degenerative changes are present in the imaged portions of the spine. No acute osseous abnormality or suspicious osseous lesion. No suspicious chest wall lesions. Review of the MIP images confirms the above findings. IMPRESSION: 1. No evidence of pulmonary embolism. 2. Circumferential esophageal wall thickening and a small to moderate hiatal hernia with adjacent lower mediastinal low-attenuation fluid and reactive adenopathy, suggestive of esophagitis. Consider correlation with endoscopy. 3. Chronic right atrial and ventricular enlargement with slight flattening of the septum may reflect elevated right heart pressures. 4. Diffuse secretions throughout the airways and trachea. 5. Sequela of remote granulomatous disease. 6. Emphysema (ICD10-J43.9). 7. Aortic Atherosclerosis (ICD10-I70.0) Electronically Signed   By: Kreg Shropshire M.D.   On: 10/04/2019 15:43    Pamella Pert, MD, PhD [facility]  Between 7 am - 7 pm I am available, please contact me via Amion or Securechat  Between 7 pm - 7 am I am not available, please contact night coverage MD/APP via Amion

## 2019-10-05 NOTE — Plan of Care (Signed)
  Problem: Health Behavior/Discharge Planning: Goal: Ability to manage health-related needs will improve Outcome: Progressing   Problem: Activity: Goal: Risk for activity intolerance will decrease Outcome: Progressing   

## 2019-10-05 NOTE — Progress Notes (Signed)
Patient Name: Jason Patterson Date of Encounter: 10/05/2019  Hospital Problem List     Active Problems:   Elevated troponin    Patient Profile     Koji Niehoff Blanchardis a 63 y.o.malewith history ofCOPD, bipolar disorder, schizoaffective disorder, hypertension and seizure disorder who presented to the emergency room with complaints of left-sided chest pain. Felt to be superior greater than 10 out of 10 with no radiation. Electrocardiogram was unremarkable for acute injury or ischemia. Showed sinus rhythm with nonspecific changes.. Serum high-sensitivity troponin was 150. Chest x-ray revealed no acute cardiopulmonary disease. Pain remained atypical, reproduced with deep breathing and with any palpation of the chest anterior and posterior. ECHO showed no wall motion abnormality with normal lv function. Chest CT negative for pe. Suggestive of esophagitis.   Subjective   Continued chest pain with very atypical features.   Inpatient Medications    . aspirin EC  81 mg Oral Daily  . atorvastatin  40 mg Oral q1800  . cholecalciferol  2,000 Units Oral Daily  . famotidine  10 mg Oral BID  . influenza vac split quadrivalent PF  0.5 mL Intramuscular Tomorrow-1000  . latanoprost  1 drop Both Eyes QHS  . levETIRAcetam  1,000 mg Oral BID  . loxapine  25 mg Oral TID  . metoprolol tartrate  12.5 mg Oral BID  . nitroGLYCERIN  1 inch Topical Q6H  . OLANZapine  20 mg Oral QHS  . pneumococcal 23 valent vaccine  0.5 mL Intramuscular Tomorrow-1000  . QUEtiapine  25 mg Oral TID    Vital Signs    Vitals:   10/04/19 1736 10/04/19 1810 10/04/19 1938 10/05/19 0321  BP: 110/66 106/78 111/62 94/68  Pulse: 76 79 81 66  Resp: 17 19 18 18   Temp:  (!) 100.4 F (38 C) 98.2 F (36.8 C) 99.3 F (37.4 C)  TempSrc:  Oral Oral Oral  SpO2: 99% 97% 98% 97%  Weight:  69.4 kg  69.9 kg  Height:  5\' 7"  (1.702 m)      Intake/Output Summary (Last 24 hours) at 10/05/2019 Last data filed  at 10/05/2019 0330 Gross per 24 hour  Intake -  Output 851 ml  Net -851 ml   Filed Weights   10/03/19 0300 10/04/19 1810 10/05/19 0321  Weight: 70.8 kg 69.4 kg 69.9 kg    Physical Exam    GEN: Well nourished, well developed, in no acute distress.  HEENT: normal.  Neck: Supple, no JVD, carotid bruits, or masses. Cardiac: RRR, no murmurs, rubs, or gallops. No clubbing, cyanosis, edema.  Radials/DP/PT 2+ and equal bilaterally.  Respiratory:  Respirations regular and unlabored, clear to auscultation bilaterally. GI: Soft, nontender, nondistended, BS + x 4. MS: no deformity or atrophy. Skin: warm and dry, no rash. Neuro:  Strength and sensation are intact. Psych: Normal affect.  Labs    CBC Recent Labs    10/03/19 0141  10/04/19 0407 10/05/19 0450  WBC 13.0*   < > 9.7 5.6  NEUTROABS 10.2*  --   --   --   HGB 10.9*   < > 9.0* 7.6*  HCT 32.7*   < > 25.3* 21.8*  MCV 94.5   < > 90.4 90.5  PLT 223   < > 169 156   < > = values in this interval not displayed.   Basic Metabolic Panel Recent Labs    10/06/19 0407 10/05/19 0450  NA 133* 136  K 3.4* 3.4*  CL 103 107  CO2 22 21*  GLUCOSE 126* 99  BUN 15 12  CREATININE 0.71 0.85  CALCIUM 8.3* 8.3*   Liver Function Tests Recent Labs    10/03/19 0141 10/04/19 0407  AST 18 17  ALT 14 11  ALKPHOS 60 54  BILITOT 0.6 0.6  PROT 6.0* 5.4*  ALBUMIN 3.4* 3.1*   Recent Labs    10/03/19 0141  LIPASE 24   Cardiac Enzymes No results for input(s): CKTOTAL, CKMB, CKMBINDEX, TROPONINI in the last 72 hours. BNP No results for input(s): BNP in the last 72 hours. D-Dimer No results for input(s): DDIMER in the last 72 hours. Hemoglobin A1C No results for input(s): HGBA1C in the last 72 hours. Fasting Lipid Panel Recent Labs    10/03/19 2004  CHOL 99  HDL 31*  LDLCALC 53  TRIG 77  CHOLHDL 3.2   Thyroid Function Tests No results for input(s): TSH, T4TOTAL, T3FREE, THYROIDAB in the last 72 hours.  Invalid input(s):  FREET3  Telemetry    nsr  ECG    nsr with no ischemia  Radiology    Ct Angio Chest Pe W Or Wo Contrast  Result Date: 10/04/2019 CLINICAL DATA:  Positive D-dimer, PE suspected, sudden onset left chest pain tightness, 10/10 in severity EXAM: CT ANGIOGRAPHY CHEST WITH CONTRAST TECHNIQUE: Multidetector CT imaging of the chest was performed using the standard protocol during bolus administration of intravenous contrast. Multiplanar CT image reconstructions and MIPs were obtained to evaluate the vascular anatomy. CONTRAST:  75mL OMNIPAQUE IOHEXOL 350 MG/ML SOLN COMPARISON:  CTA chest 08/27/2018 FINDINGS: Cardiovascular: Satisfactory opacification the pulmonary arteries to the segmental level. No pulmonary arterial filling defects are identified. Central pulmonary arteries are normal caliber. Flattening of the intraventricular septum and right cardiac enlargement is similar to comparison CTs. Coronary artery calcifications are present. The aorta is minimally calcified but otherwise unremarkable and normal caliber. Normal 3 vessel branching of the arch. Mediastinum/Nodes: There is circumferential esophageal wall thickening and a small to moderate hiatal hernia with a small amount of adjacent lower mediastinal low-attenuation fluid and reactive adenopathy. Diffuse secretions are seen throughout the airways and trachea. No pathologically enlarged mediastinal, hilar or axillary nodes. Thyroid gland and thoracic inlet are unremarkable. Lungs/Pleura: Diffuse paraseptal and centrilobular emphysematous changes are present throughout the lungs. Scattered calcified granulomata are noted. Evaluation for nodules and masses is somewhat limited due to respiratory motion artifact though no concerning focal abnormality is seen. There is diffuse airways thickening. No pneumothorax. No effusion. Upper Abdomen: No acute abnormalities present in the visualized portions of the upper abdomen. Musculoskeletal: Remote posttraumatic  deformity of the superior third sternum Multilevel degenerative changes are present in the imaged portions of the spine. No acute osseous abnormality or suspicious osseous lesion. No suspicious chest wall lesions. Review of the MIP images confirms the above findings. IMPRESSION: 1. No evidence of pulmonary embolism. 2. Circumferential esophageal wall thickening and a small to moderate hiatal hernia with adjacent lower mediastinal low-attenuation fluid and reactive adenopathy, suggestive of esophagitis. Consider correlation with endoscopy. 3. Chronic right atrial and ventricular enlargement with slight flattening of the septum may reflect elevated right heart pressures. 4. Diffuse secretions throughout the airways and trachea. 5. Sequela of remote granulomatous disease. 6. Emphysema (ICD10-J43.9). 7. Aortic Atherosclerosis (ICD10-I70.0) Electronically Signed   By: Kreg ShropshirePrice  DeHay M.D.   On: 10/04/2019 15:43   Dg Chest Port 1 View  Result Date: 10/03/2019 CLINICAL DATA:  Chest pain EXAM: PORTABLE CHEST 1 VIEW COMPARISON:  02/08/2019 FINDINGS: Heart and mediastinal  contours are within normal limits. No focal opacities or effusions. No acute bony abnormality. Old healed left rib fractures. IMPRESSION: No active disease. Electronically Signed   By: Rolm Baptise M.D.   On: 10/03/2019 01:57    Assessment & Plan    63 y.o.malewith history ofCOPD, bipolar disorder, schizoaffective disorder, hypertension and seizure disorder who presented to the emergency room with complaints of left-sided chest pain. Felt to be superior greater than 10 out of 10 with no radiation. Electrocardiogram was unremarkable for acute injury or ischemia. Showed sinus rhythm with no evidence of ischemia or injury.  Serum high-sensitivity troponin was 150. Chest x-ray revealed no acute cardiopulmonary disease. No pe on chest ct.   1. Chest pain-very atypical features and improved today. Echocardiogram revealed normal lv function  with no wall motion abnormalities. No significant valvular abnormalities.  Chest CT with contrast revealed no pulmonary embolus with moderate emphysema. There were scattered coronary calcifications. No aneurysm. No dissection. Currently on heparin. Does not meet troponin qualification of a non-ST elevation myocardial infarction. Elevated troponin is likely multifactorial and very atypical of ACS.Marland Kitchen Symptoms are extremely atypical for acute coronary syndrome. Will continue with current meds including asa at 81 mg daily, atorvastatin 40 mg daily, metoprolol tartrate 12.5 mg bid. Will discontinue ntg ointment and follow symptoms. Ambulate today and follow for symptoms.   2. Complaint of diarrhea. stable at present.   3. Hypertension-continue with Lopressor at 12.5 mg twice daily, blood pressure is controlled at present was 94/68  this morning.  Heart rate was 80. Will d/c ntg ointment and follow pressures.   4. Tobacco abuse-smoking cessation discussed.  5. Schizoaffective/bipolar disorder. -Is on Abilify and Seroquel.  We will continue with this for now and follow-up for decompensation.  6. Seizure disorder-on Keppra. No evidence of seizure activity  7. Anxiety.  Anxiolytics as needed.  8. Esophagitis- Continue with pepcid   Signed, Javier Docker. Derrien Anschutz MD 10/05/2019, 7:22 AM  Pager: [phone]

## 2019-10-05 NOTE — Progress Notes (Signed)
Patient  reveiving unit of PRBC as per order, no reaction noted

## 2019-10-05 NOTE — Progress Notes (Signed)
PRBC transfusion completed, patient tolerated, no reaction noted, patient resting in the bed at this time .
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
## 2019-10-06 ENCOUNTER — Encounter
Admission: EM | Disposition: A | Discharge status: Skilled Nursing Facility | Payer: Self-pay | Source: Home / Self Care | Attending: Internal Medicine

## 2019-10-06 ENCOUNTER — Encounter: Payer: Self-pay | Admitting: Anesthesiology

## 2019-10-06 ENCOUNTER — Inpatient Hospital Stay: Admission: RE | Admit: 2019-10-06 | Payer: Medicaid Other | Source: Ambulatory Visit

## 2019-10-06 ENCOUNTER — Inpatient Hospital Stay: Payer: Medicaid Other

## 2019-10-06 LAB — BASIC METABOLIC PANEL
Anion gap: 5 (ref 5–15)
BUN: 11 mg/dL (ref 8–23)
CO2: 22 mmol/L (ref 22–32)
Calcium: 8.4 mg/dL — ABNORMAL LOW (ref 8.9–10.3)
Chloride: 111 mmol/L (ref 98–111)
Creatinine, Ser: 0.76 mg/dL (ref 0.61–1.24)
GFR calc Af Amer: >60 mL/min (ref >60)
GFR calc non Af Amer: >60 mL/min (ref >60)
Glucose, Bld: 90 mg/dL (ref 70–99)
Potassium: 3.6 mmol/L (ref 3.5–5.1)
Sodium: 138 mmol/L (ref 135–145)

## 2019-10-06 LAB — CBC
HCT: 26.5 % — ABNORMAL LOW (ref 39.0–52.0)
Hemoglobin: 8.9 g/dL — ABNORMAL LOW (ref 13.0–17.0)
MCH: 31 pg (ref 26.0–34.0)
MCHC: 33.6 g/dL (ref 30.0–36.0)
MCV: 92.3 fL (ref 80.0–100.0)
Platelets: 178 10*3/uL (ref 150–400)
RBC: 2.87 MIL/uL — ABNORMAL LOW (ref 4.22–5.81)
RDW: 14.6 % (ref 11.5–15.5)
WBC: 4.4 10*3/uL (ref 4.0–10.5)
nRBC: 0 % (ref 0.0–0.2)

## 2019-10-06 LAB — TYPE AND SCREEN
ABO/RH(D): A POS
Antibody Screen: NEGATIVE
Unit division: 0

## 2019-10-06 LAB — BPAM RBC
Blood Product Expiration Date: 202012182359
ISSUE DATE / TIME: 202011171553
Unit Type and Rh: 6200

## 2019-10-06 SURGERY — ESOPHAGOGASTRODUODENOSCOPY (EGD) WITH PROPOFOL
Anesthesia: General

## 2019-10-06 MED ORDER — ALPRAZOLAM 0.25 MG PO TABS
0.2500 mg | ORAL_TABLET | Freq: Three times a day (TID) | ORAL | Status: DC | PRN
  Administered 2019-10-06 – 2019-10-12 (×11): 0.25 mg via ORAL
  Filled 2019-10-06 (×11): qty 1
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
## 2019-10-06 NOTE — Plan of Care (Signed)
  Problem: Education: Goal: Knowledge of General Education information will improve Description: Including pain rating scale, medication(s)/side effects and non-pharmacologic comfort measures Outcome: Not Progressing Note: Patient is schizophrenic and bipolar with a reported cognitive delay from a group home. Barely redirectable. Will continue to monitor neurological status for the remainder of the shift. Wenda Low [facility]
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
## 2019-10-06 NOTE — Progress Notes (Signed)
Patient extremely impulsive, even with chair alarm on. Only redirectable sporadically. Patient with a variety of emotions, sometimes at the same time. Sometimes doesn't respond to any questioning, for reasons unknown. Will continue to monitor. Wenda Low [facility]

## 2019-10-06 NOTE — Progress Notes (Signed)
Pt throuhout the evening refused to wear his ekg monitor. Notified NPand made aware.

## 2019-10-06 NOTE — Plan of Care (Signed)
  Problem: Education: Goal: Knowledge of General Education information will improve Description: Including pain rating scale, medication(s)/side effects and non-pharmacologic comfort measures Outcome: Progressing   Problem: Safety: Goal: Ability to remain free from injury will improve Outcome: Not Progressing   

## 2019-10-06 NOTE — Progress Notes (Addendum)
PROGRESS NOTE  Jason Patterson UUV:[id number] DOB: [birth year] DOA: 10/03/2019 PCP: Terrill Mohr, NP   LOS: 3 days   Brief Narrative / Interim history:  63 year old male with COPD, bipolar disorder, schizoaffective disorder, depression, hypertension, seizure disorder who came in with sudden onset of left chest pain/tightness, graded 10/10 in severity.  He denies any similar pain in the past.  On my evaluation this morning he says that he continues to have chest pain, it somewhat positional and worse with movement.  Chest x-ray on admission showed no acute cardiopulmonary disease.  EKG is nonischemic.  His troponin was positive on admission and he was started on heparin for NSTEMI and cardiology was consulted by admitting physician  Subjective / 24h Interval events: Has recurrent chest pain this morning.  Quite upset about being n.p.o. and in fact had some water from the faucet.  After initially answering a couple of my questions he is not making eye contact, sitting in the chair, and refusing to interact or answer any more questions  Assessment & Plan: Principal Problem Chest pain, initial suspect for non-STEMI now ruled out, potential esophagitis -Cardiology following, discussed with Dr. Ubaldo Glassing, he did have elevated troponin but I agree that his chest pain does notsound ischemic, however he is a very poor historian. -2D echo done showed normal EF 55-60%, -D-dimer was elevated and patient underwent a CT angiogram which was negative for PE however it showed circumferential esophageal wall thickening with reactive adenopathy suggestive of esophagitis. -Gastroenterology consulted, patient was n.p.o. this morning however because of drinking water his EGD was canceled -Discussed with Dr. Allen Norris, obtain a barium swallow, potential reattempt on EGD later today or tomorrow morning -I have extensively discussed and counseled patient regarding need for n.p.o., need for the procedure as to ways to  explain his chest pain and identify adequate treatment  Active Problems Anemia -Of chronic disease, however his hemoglobin has dropped from 10.9 on admission to 7.6 (along with all the other cell lines which may suggest dilutional component) however he was on a heparin drip for at least 24 hours. -Received a unit of packed red blood cells on 11/17, hemoglobin improved appropriately today  Hypertension -He is on metoprolol, blood pressure on the low normal this morning, will add holding parameters  Bipolar disorder/schizoaffective disorder/history of seizure disorder -Continue home medications  BPH -Continue Flomax  Tobacco use -will need to quit   Scheduled Meds: . aspirin EC  81 mg Oral Daily  . atorvastatin  40 mg Oral q1800  . cholecalciferol  2,000 Units Oral Daily  . famotidine  10 mg Oral BID  . latanoprost  1 drop Both Eyes QHS  . levETIRAcetam  1,000 mg Oral BID  . loxapine  25 mg Oral TID  . metoprolol tartrate  12.5 mg Oral BID  . OLANZapine  20 mg Oral QHS  . pantoprazole (PROTONIX) IV  40 mg Intravenous Q12H  . QUEtiapine  25 mg Oral TID  . senna-docusate  2 tablet Oral BID  . sucralfate  1 g Oral TID WC & HS   Continuous Infusions:  PRN Meds:.acetaminophen, albuterol, ALPRAZolam, ketorolac, nitroGLYCERIN, ondansetron (ZOFRAN) IV, zolpidem  DVT prophylaxis: Heparin infusion Code Status: Full code Family Communication: Discussed with patient and called patient's guardian Freda Munro [phone] on 10/05/2019 Disposition Plan: Home when ready  Consultants:  Cardiology   Procedures:  2D echo: pending IMPRESSIONS  1. Left ventricular ejection fraction, by visual estimation, is 55 to 60%. The left ventricle has normal function. There  is no left ventricular hypertrophy.  2. Global right ventricle has normal systolic function.The right ventricular size is normal. No increase in right ventricular wall thickness.  3. Left atrial size was normal.  4. Right atrial  size was normal.  5. The mitral valve is normal in structure. Trace mitral valve regurgitation. No evidence of mitral stenosis.  6. The tricuspid valve is normal in structure. Tricuspid valve regurgitation is trivial.  7. The aortic valve is normal in structure. Aortic valve regurgitation is not visualized. No evidence of aortic valve sclerosis or stenosis.  8. The pulmonic valve was normal in structure. Pulmonic valve regurgitation is not visualized.  9. The inferior vena cava is normal in size with greater than 50% respiratory variability, suggesting right atrial pressure of 3 mmHg.   Microbiology  None   Antimicrobials: None     Objective: Vitals:   10/05/19 1618 10/05/19 1843 10/06/19 0532 10/06/19 0845  BP: 97/69 (!) 94/59 95/67 (!) 126/94  Pulse: 68 74 68 67  Resp: 18 18 16    Temp: 99.4 F (37.4 C) 99.7 F (37.6 C) 98.7 F (37.1 C) 97.8 F (36.6 C)  TempSrc: Oral Oral Oral Oral  SpO2: 98% 97% 95% 100%  Weight:   69.5 kg   Height:        Intake/Output Summary (Last 24 hours) at 10/06/2019 1136 Last data filed at 10/06/2019 0647 Gross per 24 hour  Intake 860 ml  Output 2545 ml  Net -1685 ml   Filed Weights   10/04/19 1810 10/05/19 0321 10/06/19 0532  Weight: 69.4 kg 69.9 kg 69.5 kg    Examination:  Constitutional: Quite upset as described above, minimally interactive with me but no apparent distress Eyes: No scleral icterus seen ENMT: Moist mucous membranes  Patient defers physical exam today  Data Reviewed: I have independently reviewed following labs and imaging studies   CBC: Recent Labs  Lab 10/03/19 0141 10/03/19 2004 10/04/19 0407 10/05/19 0450 10/06/19 0611  WBC 13.0* 9.8 9.7 5.6 4.4  NEUTROABS 10.2*  --   --   --   --   HGB 10.9* 9.0* 9.0* 7.6* 8.9*  HCT 32.7* 26.0* 25.3* 21.8* 26.5*  MCV 94.5 91.2 90.4 90.5 92.3  PLT 223 198 169 156 178   Basic Metabolic Panel: Recent Labs  Lab 10/03/19 0141 10/03/19 2004 10/04/19 0407 10/05/19  0450 10/06/19 0611  NA 133* 135 133* 136 138  K 3.5 3.1* 3.4* 3.4* 3.6  CL 104 104 103 107 111  CO2 21* 23 22 21* 22  GLUCOSE 104* 131* 126* 99 90  BUN 34* 19 15 12 11   CREATININE 0.82 0.72 0.71 0.85 0.76  CALCIUM 8.5* 8.5* 8.3* 8.3* 8.4*   Liver Function Tests: Recent Labs  Lab 10/03/19 0141 10/04/19 0407  AST 18 17  ALT 14 11  ALKPHOS 60 54  BILITOT 0.6 0.6  PROT 6.0* 5.4*  ALBUMIN 3.4* 3.1*   Coagulation Profile: Recent Labs  Lab 10/03/19 0345 10/03/19 2004  INR 1.2 1.2   HbA1C: No results for input(s): HGBA1C in the last 72 hours. CBG: No results for input(s): GLUCAP in the last 168 hours.  Recent Results (from the past 240 hour(s))  SARS CORONAVIRUS 2 (TAT 6-24 HRS) Nasopharyngeal Nasopharyngeal Swab     Status: None   Collection Time: 10/03/19  1:48 AM   Specimen: Nasopharyngeal Swab  Result Value Ref Range Status   SARS Coronavirus 2 NEGATIVE NEGATIVE Final    Comment: (NOTE) SARS-CoV-2 target nucleic  acids are NOT DETECTED. The SARS-CoV-2 RNA is generally detectable in upper and lower respiratory specimens during the acute phase of infection. Negative results do not preclude SARS-CoV-2 infection, do not rule out co-infections with other pathogens, and should not be used as the sole basis for treatment or other patient management decisions. Negative results must be combined with clinical observations, patient history, and epidemiological information. The expected result is Negative. Fact Sheet for Patients: HairSlick.nohttps://www.fda.gov/media/138098/download Fact Sheet for Healthcare Providers: quierodirigir.comhttps://www.fda.gov/media/138095/download This test is not yet approved or cleared by the [place] FDA and  has been authorized for detection and/or diagnosis of SARS-CoV-2 by FDA under an Emergency Use Authorization (EUA). This EUA will remain  in effect (meaning this test can be used) for the duration of the COVID-19 declaration under Section 56 4(b)(1) of the  Act, 21 U.S.C. section 360bbb-3(b)(1), unless the authorization is terminated or revoked sooner. Performed at [facility], [address] [place], [place] [address]      Radiology Studies: Dg Esophagus W Double Cm (hd)  Result Date: 10/06/2019 CLINICAL DATA:  Chest discomfort, heartburn EXAM: ESOPHOGRAM / BARIUM SWALLOW / BARIUM TABLET STUDY TECHNIQUE: Combined double contrast and single contrast examination performed using effervescent crystals, thick barium liquid, and thin barium liquid. The patient was observed with fluoroscopy swallowing a 13 mm barium sulphate tablet. FLUOROSCOPY TIME:  Fluoroscopy Time:  1.1 minute Radiation Exposure Index (if provided by the fluoroscopic device): 8.4 mGy Number of Acquired Spot Images: 0 COMPARISON:  None. FINDINGS: Degenerative disc disease with disc height loss at C4-5, C5-6 and C6-7 on the lateral view. Normal pharyngeal anatomy and motility. Contrast flowed freely through the esophagus without evidence of a stricture or mass. Normal esophageal mucosa without evidence of irregularity or ulceration. Esophageal motility was normal. No evidence of reflux. No definite hiatal hernia was demonstrated. At the end of the examination a 13 mm barium tablet was administered which transited through the esophagus and esophagogastric junction without delay. IMPRESSION: Somewhat limited examination as the patient was moving during the the entirety of the exam. No large esophageal stricture, mass, dysmotility or gastroesophageal reflux. Electronically Signed   By: Elige KoHetal  Patel   On: 10/06/2019 10:48   Time spent: 25 minutes, more than 50% at bedside and 2 separate visits discussing with the patient regarding need for endoscopy and reasoning behind n.p.o. status  Pamella Pertostin Gherghe, MD, PhD [facility]  Between 7 am - 7 pm I am available, please contact me via Amion or Securechat  Between 7 pm - 7 am I am not available, please contact night coverage MD/APP  via Amion

## 2019-10-06 NOTE — Progress Notes (Addendum)
Patient now found wandering the halls, for no reason. Chair alarm has gone off over 10 times in the last hour. Wenda Low Jayda White  Have walked with the patient FOUR times around the nurse's station. Wenda Low [facility]

## 2019-10-06 NOTE — Progress Notes (Signed)
Patient upset upon arrival to the room, multiple complaints. Refuses heart monitor and vital signs, and nearly refuses assessment. Cannot understand why EGD was delayed, even after multiple attempts to explain to the patient why. Multiple complaints about the doctor and hospital. "I'm going to die. I want to eat and drink." Offered to have doctor call patient and explain NPO status. Eventually patient refused to answer any more questions and even to answer the phone if the doctor calls. Will inform physician of developments. Wenda Low [facility]

## 2019-10-06 NOTE — TOC Progression Note (Signed)
Transition of Care [facility]) - Progression Note    Patient Details  Name: Jaymie Misch MRN: [id number] Date of Birth: [birth year]  Transition of Care [facility]) CM/SW Contact  Ross Ludwig, [place] Phone Number: 10/06/2019, 1:03 PM  Clinical Narrative:     Patient is from [facility].  Patient has a legal guardian through [place], [place], [phone].  CSW was informed that patient states he does not want to return to group home because he is scared.  CSW attempted to call patient's legal guardian to inform her of his concerns.  CSW had to leave a message awaiting for call back.     Expected Discharge Plan and Services                                                 Social Determinants of Health (SDOH) Interventions    Readmission Risk Interventions No flowsheet data found.

## 2019-10-06 NOTE — Progress Notes (Signed)
Patient Name: Jason Patterson Date of Encounter: 10/06/2019  Hospital Problem List     Active Problems:   Elevated troponin    Patient Profile     Jason Gubser Blanchardis a 63 y.o.malewith history ofCOPD, bipolar disorder, schizoaffective disorder, hypertension and seizure disorder who presented to the emergency room with complaints of left-sided chest pain. Felt to be  greater than 10 out of 10 with no radiation. Electrocardiogram was unremarkable for acute injury or ischemia. Serum high-sensitivity troponin was 150. Chest x-ray revealed no acute cardiopulmonary disease. Pain remained atypical, reproduced with deep breathing and with any palpation of the chest anterior and posterior. ECHO showed no wall motion abnormality with normal lv function. Chest CT negative for pe and was suggestive of esophagitis. HGB dropped significantly and has improved with transfusion.  Subjective   Still with very atypical chest pain. Not compliant with npo order so egd was cancelled.   Inpatient Medications    . aspirin EC  81 mg Oral Daily  . atorvastatin  40 mg Oral q1800  . cholecalciferol  2,000 Units Oral Daily  . famotidine  10 mg Oral BID  . latanoprost  1 drop Both Eyes QHS  . levETIRAcetam  1,000 mg Oral BID  . loxapine  25 mg Oral TID  . metoprolol tartrate  12.5 mg Oral BID  . OLANZapine  20 mg Oral QHS  . pantoprazole (PROTONIX) IV  40 mg Intravenous Q12H  . QUEtiapine  25 mg Oral TID  . senna-docusate  2 tablet Oral BID  . sucralfate  1 g Oral TID WC & HS    Vital Signs    Vitals:   10/05/19 1618 10/05/19 1843 10/06/19 0532 10/06/19 0845  BP: 97/69 (!) 94/59 95/67 (!) 126/94  Pulse: 68 74 68 67  Resp: 18 18 16    Temp: 99.4 F (37.4 C) 99.7 F (37.6 C) 98.7 F (37.1 C) 97.8 F (36.6 C)  TempSrc: Oral Oral Oral Oral  SpO2: 98% 97% 95% 100%  Weight:   69.5 kg   Height:        Intake/Output Summary (Last 24 hours) at 10/06/2019 1330 Last data filed at  10/06/2019 0647 Gross per 24 hour  Intake 860 ml  Output 1770 ml  Net -910 ml   Filed Weights   10/04/19 1810 10/05/19 0321 10/06/19 0532  Weight: 69.4 kg 69.9 kg 69.5 kg    Physical Exam    GEN: Well nourished, well developed, in no acute distress.  HEENT: normal.  Neck: Supple, no JVD, carotid bruits, or masses. Cardiac: RRR, no murmurs, rubs, or gallops. No clubbing, cyanosis, edema.  Radials/DP/PT 2+ and equal bilaterally.  Respiratory:  Respirations regular and unlabored, clear to auscultation bilaterally. GI: Soft, nontender, nondistended, BS + x 4. MS: no deformity or atrophy. Skin: warm and dry, no rash. Neuro:  Strength and sensation are intact. Psych: Normal affect.  Labs    CBC Recent Labs    10/05/19 0450 10/06/19 0611  WBC 5.6 4.4  HGB 7.6* 8.9*  HCT 21.8* 26.5*  MCV 90.5 92.3  PLT 156 616   Basic Metabolic Panel Recent Labs    10/05/19 0450 10/06/19 0611  NA 136 138  K 3.4* 3.6  CL 107 111  CO2 21* 22  GLUCOSE 99 90  BUN 12 11  CREATININE 0.85 0.76  CALCIUM 8.3* 8.4*   Liver Function Tests Recent Labs    10/04/19 0407  AST 17  ALT 11  ALKPHOS 54  BILITOT 0.6  PROT 5.4*  ALBUMIN 3.1*   No results for input(s): LIPASE, AMYLASE in the last 72 hours. Cardiac Enzymes No results for input(s): CKTOTAL, CKMB, CKMBINDEX, TROPONINI in the last 72 hours. BNP No results for input(s): BNP in the last 72 hours. D-Dimer No results for input(s): DDIMER in the last 72 hours. Hemoglobin A1C No results for input(s): HGBA1C in the last 72 hours. Fasting Lipid Panel Recent Labs    10/03/19 2004  CHOL 99  HDL 31*  LDLCALC 53  TRIG 77  CHOLHDL 3.2   Thyroid Function Tests No results for input(s): TSH, T4TOTAL, T3FREE, THYROIDAB in the last 72 hours.  Invalid input(s): FREET3  Telemetry    nsr  ECG    nsr with no ischemia  Radiology    Ct Angio Chest Pe W Or Wo Contrast  Result Date: 10/04/2019 CLINICAL DATA:  Positive D-dimer,  PE suspected, sudden onset left chest pain tightness, 10/10 in severity EXAM: CT ANGIOGRAPHY CHEST WITH CONTRAST TECHNIQUE: Multidetector CT imaging of the chest was performed using the standard protocol during bolus administration of intravenous contrast. Multiplanar CT image reconstructions and MIPs were obtained to evaluate the vascular anatomy. CONTRAST:  75mL OMNIPAQUE IOHEXOL 350 MG/ML SOLN COMPARISON:  CTA chest 08/27/2018 FINDINGS: Cardiovascular: Satisfactory opacification the pulmonary arteries to the segmental level. No pulmonary arterial filling defects are identified. Central pulmonary arteries are normal caliber. Flattening of the intraventricular septum and right cardiac enlargement is similar to comparison CTs. Coronary artery calcifications are present. The aorta is minimally calcified but otherwise unremarkable and normal caliber. Normal 3 vessel branching of the arch. Mediastinum/Nodes: There is circumferential esophageal wall thickening and a small to moderate hiatal hernia with a small amount of adjacent lower mediastinal low-attenuation fluid and reactive adenopathy. Diffuse secretions are seen throughout the airways and trachea. No pathologically enlarged mediastinal, hilar or axillary nodes. Thyroid gland and thoracic inlet are unremarkable. Lungs/Pleura: Diffuse paraseptal and centrilobular emphysematous changes are present throughout the lungs. Scattered calcified granulomata are noted. Evaluation for nodules and masses is somewhat limited due to respiratory motion artifact though no concerning focal abnormality is seen. There is diffuse airways thickening. No pneumothorax. No effusion. Upper Abdomen: No acute abnormalities present in the visualized portions of the upper abdomen. Musculoskeletal: Remote posttraumatic deformity of the superior third sternum Multilevel degenerative changes are present in the imaged portions of the spine. No acute osseous abnormality or suspicious osseous  lesion. No suspicious chest wall lesions. Review of the MIP images confirms the above findings. IMPRESSION: 1. No evidence of pulmonary embolism. 2. Circumferential esophageal wall thickening and a small to moderate hiatal hernia with adjacent lower mediastinal low-attenuation fluid and reactive adenopathy, suggestive of esophagitis. Consider correlation with endoscopy. 3. Chronic right atrial and ventricular enlargement with slight flattening of the septum may reflect elevated right heart pressures. 4. Diffuse secretions throughout the airways and trachea. 5. Sequela of remote granulomatous disease. 6. Emphysema (ICD10-J43.9). 7. Aortic Atherosclerosis (ICD10-I70.0) Electronically Signed   By: Kreg ShropshirePrice  DeHay M.D.   On: 10/04/2019 15:43   Dg Chest Port 1 View  Result Date: 10/03/2019 CLINICAL DATA:  Chest pain EXAM: PORTABLE CHEST 1 VIEW COMPARISON:  02/08/2019 FINDINGS: Heart and mediastinal contours are within normal limits. No focal opacities or effusions. No acute bony abnormality. Old healed left rib fractures. IMPRESSION: No active disease. Electronically Signed   By: Charlett NoseKevin  Dover M.D.   On: 10/03/2019 01:57   Dg Esophagus W Double Cm (hd)  Result Date: 10/06/2019 CLINICAL  DATA:  Chest discomfort, heartburn EXAM: ESOPHOGRAM / BARIUM SWALLOW / BARIUM TABLET STUDY TECHNIQUE: Combined double contrast and single contrast examination performed using effervescent crystals, thick barium liquid, and thin barium liquid. The patient was observed with fluoroscopy swallowing a 13 mm barium sulphate tablet. FLUOROSCOPY TIME:  Fluoroscopy Time:  1.1 minute Radiation Exposure Index (if provided by the fluoroscopic device): 8.4 mGy Number of Acquired Spot Images: 0 COMPARISON:  None. FINDINGS: Degenerative disc disease with disc height loss at C4-5, C5-6 and C6-7 on the lateral view. Normal pharyngeal anatomy and motility. Contrast flowed freely through the esophagus without evidence of a stricture or mass. Normal  esophageal mucosa without evidence of irregularity or ulceration. Esophageal motility was normal. No evidence of reflux. No definite hiatal hernia was demonstrated. At the end of the examination a 13 mm barium tablet was administered which transited through the esophagus and esophagogastric junction without delay. IMPRESSION: Somewhat limited examination as the patient was moving during the the entirety of the exam. No large esophageal stricture, mass, dysmotility or gastroesophageal reflux. Electronically Signed   By: Elige Ko   On: 10/06/2019 10:48    Assessment & Plan    63 y.o.malewith history ofCOPD, bipolar disorder, schizoaffective disorder, hypertension and seizure disorder who presented to the emergency room with complaints of left-sided chest pain. Felt to be superior greater than 10 out of 10 with no radiation. Electrocardiogram was unremarkable for acute injury or ischemia. Showed sinus rhythm with no evidence of ischemia or injury.  Serum high-sensitivity troponin was 150. Chest x-ray revealed no acute cardiopulmonary disease. No pe on chest ct.   1.Chest pain-veryatypical features but still is present.Non verbal today.  Echocardiogram revealed normal lv function with no wall motion abnormalities. No significant valvular abnormalities. Chest CT with contrast revealed no pulmonary embolus with moderate emphysema. There were scattered coronary calcifications. No aneurysm. No dissection.  Elevated troponin is likely multifactorial and very atypical of ACS.Marland Kitchen Symptoms are extremely atypical for acute coronary syndrome. Will continue with current meds including asa at 81 mg daily, atorvastatin 40 mg daily, metoprolol tartrate 12.5 mg bid.  Gi work up pending. Symptoms somewhat atypical for gi as well. Continue with current protocol. Not candidate for invasive cardiac evaluation  2.Complaint of diarrhea. stable at present.   3.Hypertension-continue with Lopressorat 12.5  mg twice daily, blood pressure is controlled at present was 94/68  this morning. Heart rate was 80. Will d/c ntg ointment and follow pressures.   4.Tobacco abuse-smoking cessationdiscussed.  5.Schizoaffective/bipolar disorder.-Is on Abilify and Seroquel.We will continue with this for now and follow-up for decompensation.  6.Seizure disorder-on Keppra.No evidence of seizure activity  7.Anxiety.Anxiolytics as needed.  8. Esophagitis- GI workup in place.  9. Anemia-transfusion. No obvious bleeding  Signed, Darlin Priestly. Randal Yepiz MD 10/06/2019, 1:30 PM  Pager: [phone]

## 2019-10-07 LAB — CBC
HCT: 35 % — ABNORMAL LOW (ref 39.0–52.0)
Hemoglobin: 12 g/dL — ABNORMAL LOW (ref 13.0–17.0)
MCH: 31.6 pg (ref 26.0–34.0)
MCHC: 34.3 g/dL (ref 30.0–36.0)
MCV: 92.1 fL (ref 80.0–100.0)
Platelets: 153 10*3/uL (ref 150–400)
RBC: 3.8 MIL/uL — ABNORMAL LOW (ref 4.22–5.81)
RDW: 14.8 % (ref 11.5–15.5)
WBC: 5.3 10*3/uL (ref 4.0–10.5)
nRBC: 0 % (ref 0.0–0.2)

## 2019-10-07 LAB — BASIC METABOLIC PANEL
Anion gap: 11 (ref 5–15)
BUN: 13 mg/dL (ref 8–23)
CO2: 22 mmol/L (ref 22–32)
Calcium: 9.2 mg/dL (ref 8.9–10.3)
Chloride: 104 mmol/L (ref 98–111)
Creatinine, Ser: 0.88 mg/dL (ref 0.61–1.24)
GFR calc Af Amer: >60 mL/min (ref >60)
GFR calc non Af Amer: >60 mL/min (ref >60)
Glucose, Bld: 74 mg/dL (ref 70–99)
Potassium: 4.4 mmol/L (ref 3.5–5.1)
Sodium: 137 mmol/L (ref 135–145)

## 2019-10-07 NOTE — TOC Initial Note (Addendum)
Transition of Care [facility]) - Initial/Assessment Note    Patient Details  Name: Jason Patterson MRN: [id number] Date of Birth: 26-Jan-1956  Transition of Care [facility]) CM/SW Contact:    Ross Ludwig, [place] Phone Number: 10/06/19 9:25am  Clinical Narrative:                  Patient is a 63 year old male who is alert and oriented x3 and lives in a group home called [facility].   Patient has a legal guardian through [place], her name is Freda Munro, [phone].  Patient has schizoaffective disorder, bipolar, anxiety, and major depression.  CSW received a phone call from patient's legal guardian, she was informed by group home that patient is not able to come back.  CSW informed legal guardian that group home needs to give a 30 day notice.  CSW informed legal guardian to contact DSS and she said her supervisor is also trying to work APS.  CSW attempted to call group home 3 times, and unable to get through to anyone.  Legal guardian stated that group home states they are not able to manage his behaviors anymore.  Group home stated that patient wanders from the house at night.  CSW met with patient to discuss his situation, patient stated that he used to be homeless, then he spent some time in [facility] and then after that he went to a group home which he was there for about a year and a half.  Then he was discharged from the previous group home, and then sent to the current one about a month ago.  Patient states he does not get along well with the people at his current group home, patient states they take his cigarettes from him, and he does not want to return.  CSW informed patient that this CSW will update the legal guardian about his concerns.   Expected Discharge Plan: Group Home Barriers to Discharge: Continued Medical Work up   Patient Goals and CMS Choice Patient states their goals for this hospitalization and ongoing recovery are:: To return back to the group  home. CMS Medicare.gov Compare Post Acute Care list provided to:: Patient Choice offered to / list presented to : [place] / Guardian  Expected Discharge Plan and Services Expected Discharge Plan: Group Home In-house Referral: Clinical Social Work Discharge Planning Services: NA   Living arrangements for the past 2 months: Group Home                           HH Arranged: NA [place] Agency: NA        Prior Living Arrangements/Services Living arrangements for the past 2 months: Group Home Lives with:: Facility Resident Patient language and need for interpreter reviewed:: Yes Do you feel safe going back to the place where you live?: No   Patient states he does not feel safe going back to the group home he is at.  Need for Family Participation in Patient Care: Yes (Comment) Care giver support system in place?: No (comment)   Criminal Activity/Legal Involvement Pertinent to Current Situation/Hospitalization: No - Comment as needed  Activities of Daily Living Home Assistive Devices/Equipment: None ADL Screening (condition at time of admission) Patient's cognitive ability adequate to safely complete daily activities?: Yes Is the patient deaf or have difficulty hearing?: No Does the patient have difficulty seeing, even when wearing glasses/contacts?: No Does the patient have difficulty concentrating, remembering, or  making decisions?: No Patient able to express need for assistance with ADLs?: No Does the patient have difficulty dressing or bathing?: No Independently performs ADLs?: Yes (appropriate for developmental age) Does the patient have difficulty walking or climbing stairs?: No Weakness of Legs: None Weakness of Arms/Hands: None  Permission Sought/Granted Permission sought to share information with : Facility Art therapist granted to share information with : Yes, Verbal Permission Granted  Share Information with NAME: Freda Munro legal guardian [phone]  [phone]           Emotional Assessment Appearance:: Appears stated age Attitude/Demeanor/Rapport: Complaining, Suspicious Affect (typically observed): Afraid/Fearful, Anxious, Apprehensive, Hopeless, Irritable, Restless Orientation: : Oriented to Place, Oriented to Self, Oriented to  Time Alcohol / Substance Use: Not Applicable Psych Involvement: Yes (comment)  Admission diagnosis:  Chest wall pain [R07.89] NSTEMI (non-ST elevated myocardial infarction) ([place]) [I21.4] Chest pain, unspecified type [R07.9] Patient Active Problem List   Diagnosis Date Noted  . Elevated troponin 10/03/2019  . MDD (major depressive disorder), recurrent severe, without psychosis ([place]) 02/10/2019  . Major depressive disorder, recurrent, severe w/o psychotic behavior ([place]) 11/29/2018  . Respiratory distress 08/28/2018  . Hyperglycemia 08/28/2018  . Hyponatremia 08/28/2018  . Acute kidney injury superimposed on chronic kidney disease ([place]) 08/28/2018  . Rhabdomyolysis 08/28/2018  . Elevated transaminase level 08/28/2018  . Seizure ([place]) 08/27/2018  . Suicidal ideation   . Gastritis 03/25/2015  . Esophagitis determined by endoscopy 03/25/2015  . Seizures ([place]) 03/24/2015  . Coffee ground emesis   . Syncope 03/23/2015  . Fall 02/16/2015  . Traumatic pneumothorax 02/15/2015  . Rib fractures 02/15/2015  . Sepsis due to urinary tract infection ([place]) 02/03/2015  . Altered mental status 02/03/2015  . SIRS (systemic inflammatory response syndrome) ([place]) 02/03/2015  . Protein-calorie malnutrition, severe ([place]) 02/03/2015  . UTI (lower urinary tract infection)   . Ulnar neuropathy at elbow of left upper extremity 06/23/2014  . Schizoaffective disorder ([place]) 05/02/2014  . Chest pain 05/01/2014  . Major depression, chronic 04/26/2014  . Schizoaffective disorder, bipolar type ([place]) 04/26/2014  . Generalized anxiety disorder 05/01/2009  . Essential hypertension 05/01/2009  . RIGHT BUNDLE BRANCH BLOCK  05/01/2009  . COPD (chronic obstructive pulmonary disease) ([place]) 05/01/2009  . GERD 05/01/2009  . PSORIASIS 05/01/2009  . Convulsions ([place]) 05/01/2009  . CHEST PAIN UNSPECIFIED 05/01/2009   PCP:  Terrill Mohr, NP Pharmacy:   [facility] [address] [place] [address] Phone: [phone] Fax: [place], [place] [address] [place] [address] Phone: [phone] Fax: [phone]     Social Determinants of Health (SDOH) Interventions    Readmission Risk Interventions No flowsheet data found.

## 2019-10-07 NOTE — Progress Notes (Signed)
The patient was supposed to have an EGD yesterday but had drank water before the procedure and then refused to stay n.p.o. to have the procedure done later in the day.  Because of that a barium swallow was ordered and was reported to show:  "Normal pharyngeal anatomy and motility. Contrast flowed freely through the esophagus without evidence of a stricture or mass."  Nothing further to do from a GI point of view since the patient has refused any work-up and the barium swallow did not show any masses or strictures.

## 2019-10-07 NOTE — Progress Notes (Signed)
PROGRESS NOTE  Jason Patterson HKV:[id number] DOB: 08/26/1956 DOA: 10/03/2019 PCP: Terrill Mohr, NP   LOS: 4 days   Brief Narrative / Interim history:  63 year old male with COPD, bipolar disorder, schizoaffective disorder, depression, hypertension, seizure disorder who came in with sudden onset of left chest pain/tightness, graded 10/10 in severity.  He denies any similar pain in the past.  On my evaluation this morning he says that he continues to have chest pain, it somewhat positional and worse with movement.  Chest x-ray on admission showed no acute cardiopulmonary disease.  EKG is nonischemic.  His troponin was positive on admission and he was started on heparin for NSTEMI.  Cardiology consulted but eventually Heparin was discontinued as he was ruled out for NSTEMI.  D-dimer was elevated and underwent a CT angiogram which was negative for PE however it did show esophageal thickening which can be the source of his continues chest pain.  Gastroenterology was consulted, they plan to do an EGD on 11/18 however patient became extremely agitated due to being n.p.o. and drank water, and thus EGD was canceled.  Barium swallow was done afterwards that was unremarkable  Subjective / 24h Interval events: Standing in the door looking for the phlebotomist, he initially refused blood work this morning but now he wants blood work done.  Does not really want to talk to me and he wants phlebotomy to come back to his room ASAP.  He eventually calmed down, we walked back to the room, and complaints of chest pain which is significantly better but still tender.  No problems eating.  Assessment & Plan: Principal Problem Chest pain, initial suspect for non-STEMI now ruled out, potential esophagitis -Cardiology following, discussed with Dr. Ubaldo Glassing, he did have elevated troponin but I agree that his chest pain does notsound ischemic, however he is a very poor historian. -2D echo done showed normal EF 55-60%,  -D-dimer was elevated and patient underwent a CT angiogram which was negative for PE however it showed circumferential esophageal wall thickening with reactive adenopathy suggestive of esophagitis. -Gastroenterology consulted, patient was n.p.o. on 11/18 however because of drinking water his EGD was canceled -Discussed with Dr. Allen Norris, barium swallow unremarkable 11/19, no further GI work-up -Continue Carafate, continue PPI, chest pain improving -His chest pain is reproducible with palpation and movement, suspect musculoskeletal versus alternatively esophageal etiology  Active Problems Anemia -Of chronic disease, however his hemoglobin has dropped from 10.9 on admission to 7.6 (along with all the other cell lines which may suggest dilutional component) however he was on a heparin drip for at least 24 hours. -Received a unit of packed red blood cells on 11/17, hemoglobin improved and rising today.  Hypertension -Continue metoprolol, blood pressure stable  Bipolar disorder/schizoaffective disorder/history of seizure disorder -Continue home medications, he is getting ECT as an outpatient.  He is now past due, and usually per his POA he gets extremely agitated once a certain time has passed.  Psychiatry was consulted, evaluated patient on 11/18, appreciate input  BPH -Continue Flomax  Tobacco use -will need to quit   Scheduled Meds: . aspirin EC  81 mg Oral Daily  . atorvastatin  40 mg Oral q1800  . cholecalciferol  2,000 Units Oral Daily  . famotidine  10 mg Oral BID  . latanoprost  1 drop Both Eyes QHS  . levETIRAcetam  1,000 mg Oral BID  . loxapine  25 mg Oral TID  . metoprolol tartrate  12.5 mg Oral BID  . OLANZapine  20  mg Oral QHS  . pantoprazole (PROTONIX) IV  40 mg Intravenous Q12H  . senna-docusate  2 tablet Oral BID  . sucralfate  1 g Oral TID WC & HS   Continuous Infusions:  PRN Meds:.acetaminophen, albuterol, ALPRAZolam, ketorolac, nitroGLYCERIN, ondansetron (ZOFRAN)  IV, zolpidem  DVT prophylaxis: Heparin infusion Code Status: Full code Family Communication: Discussed with patient and called patient's guardian Velna HatchetSheila [phone] on 10/05/2019 Disposition Plan: Home when ready  Consultants:  Cardiology   Procedures:  2D echo: pending IMPRESSIONS  1. Left ventricular ejection fraction, by visual estimation, is 55 to 60%. The left ventricle has normal function. There is no left ventricular hypertrophy.  2. Global right ventricle has normal systolic function.The right ventricular size is normal. No increase in right ventricular wall thickness.  3. Left atrial size was normal.  4. Right atrial size was normal.  5. The mitral valve is normal in structure. Trace mitral valve regurgitation. No evidence of mitral stenosis.  6. The tricuspid valve is normal in structure. Tricuspid valve regurgitation is trivial.  7. The aortic valve is normal in structure. Aortic valve regurgitation is not visualized. No evidence of aortic valve sclerosis or stenosis.  8. The pulmonic valve was normal in structure. Pulmonic valve regurgitation is not visualized.  9. The inferior vena cava is normal in size with greater than 50% respiratory variability, suggesting right atrial pressure of 3 mmHg.   Microbiology  None   Antimicrobials: None     Objective: Vitals:   10/06/19 2116 10/07/19 0632 10/07/19 0731 10/07/19 0731  BP: 102/65 105/65 127/78 127/78  Pulse: 81 64 62 64  Resp:  16    Temp:  98.4 F (36.9 C) (!) 97.4 F (36.3 C) (!) 97.4 F (36.3 C)  TempSrc:  Oral Oral Oral  SpO2:  100% 100% 100%  Weight:  69.4 kg    Height:        Intake/Output Summary (Last 24 hours) at 10/07/2019 1116 Last data filed at 10/07/2019 0800 Gross per 24 hour  Intake 240 ml  Output 1400 ml  Net -1160 ml   Filed Weights   10/05/19 0321 10/06/19 0532 10/07/19 0632  Weight: 69.9 kg 69.5 kg 69.4 kg    Examination: Constitutional: NAD, calm, comfortable Eyes: PERRL,  lids and conjunctivae normal ENMT: Mucous membranes are moist. Neck: normal, supple, no masses, no thyromegaly Respiratory: clear to auscultation bilaterally, no wheezing, no crackles. Normal respiratory effort.  Cardiovascular: Regular rate and rhythm, no murmurs / rubs / gallops. No extremity edema. Abdomen: no tenderness, no masses palpated. Bowel sounds positive.  Musculoskeletal: no clubbing / cyanosis Neurologic: Nonfocal, ambulatory   Data Reviewed: I have independently reviewed following labs and imaging studies   CBC: Recent Labs  Lab 10/03/19 0141 10/03/19 2004 10/04/19 0407 10/05/19 0450 10/06/19 0611 10/07/19 0919  WBC 13.0* 9.8 9.7 5.6 4.4 5.3  NEUTROABS 10.2*  --   --   --   --   --   HGB 10.9* 9.0* 9.0* 7.6* 8.9* 12.0*  HCT 32.7* 26.0* 25.3* 21.8* 26.5* 35.0*  MCV 94.5 91.2 90.4 90.5 92.3 92.1  PLT 223 198 169 156 178 153   Basic Metabolic Panel: Recent Labs  Lab 10/03/19 2004 10/04/19 0407 10/05/19 0450 10/06/19 0611 10/07/19 0919  NA 135 133* 136 138 137  K 3.1* 3.4* 3.4* 3.6 4.4  CL 104 103 107 111 104  CO2 23 22 21* 22 22  GLUCOSE 131* 126* 99 90 74  BUN 19 15 12  11  13  CREATININE 0.72 0.71 0.85 0.76 0.88  CALCIUM 8.5* 8.3* 8.3* 8.4* 9.2   Liver Function Tests: Recent Labs  Lab 10/03/19 0141 10/04/19 0407  AST 18 17  ALT 14 11  ALKPHOS 60 54  BILITOT 0.6 0.6  PROT 6.0* 5.4*  ALBUMIN 3.4* 3.1*   Coagulation Profile: Recent Labs  Lab 10/03/19 0345 10/03/19 2004  INR 1.2 1.2   HbA1C: No results for input(s): HGBA1C in the last 72 hours. CBG: No results for input(s): GLUCAP in the last 168 hours.  Recent Results (from the past 240 hour(s))  SARS CORONAVIRUS 2 (TAT 6-24 HRS) Nasopharyngeal Nasopharyngeal Swab     Status: None   Collection Time: 10/03/19  1:48 AM   Specimen: Nasopharyngeal Swab  Result Value Ref Range Status   SARS Coronavirus 2 NEGATIVE NEGATIVE Final    Comment: (NOTE) SARS-CoV-2 target nucleic acids are NOT  DETECTED. The SARS-CoV-2 RNA is generally detectable in upper and lower respiratory specimens during the acute phase of infection. Negative results do not preclude SARS-CoV-2 infection, do not rule out co-infections with other pathogens, and should not be used as the sole basis for treatment or other patient management decisions. Negative results must be combined with clinical observations, patient history, and epidemiological information. The expected result is Negative. Fact Sheet for Patients: HairSlick.no Fact Sheet for Healthcare Providers: quierodirigir.com This test is not yet approved or cleared by the [place] FDA and  has been authorized for detection and/or diagnosis of SARS-CoV-2 by FDA under an Emergency Use Authorization (EUA). This EUA will remain  in effect (meaning this test can be used) for the duration of the COVID-19 declaration under Section 56 4(b)(1) of the Act, 21 U.S.C. section 360bbb-3(b)(1), unless the authorization is terminated or revoked sooner. Performed at [facility], [address], [place], [place] [address]      Radiology Studies: No results found.   Pamella Pert, MD, PhD [facility]  Between 7 am - 7 pm I am available, please contact me via Amion or Securechat  Between 7 pm - 7 am I am not available, please contact night coverage MD/APP via Amion
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
## 2019-10-07 NOTE — Progress Notes (Signed)
Pt is refusing labs this AM.

## 2019-10-08 DIAGNOSIS — R4589 Other symptoms and signs involving emotional state: Secondary | ICD-10-CM

## 2019-10-08 LAB — CBC
HCT: 32.5 % — ABNORMAL LOW (ref 39.0–52.0)
Hemoglobin: 10.8 g/dL — ABNORMAL LOW (ref 13.0–17.0)
MCH: 31.6 pg (ref 26.0–34.0)
MCHC: 33.2 g/dL (ref 30.0–36.0)
MCV: 95 fL (ref 80.0–100.0)
Platelets: 240 10*3/uL (ref 150–400)
RBC: 3.42 MIL/uL — ABNORMAL LOW (ref 4.22–5.81)
RDW: 14.6 % (ref 11.5–15.5)
WBC: 6.2 10*3/uL (ref 4.0–10.5)
nRBC: 0 % (ref 0.0–0.2)

## 2019-10-08 LAB — BASIC METABOLIC PANEL
Anion gap: 10 (ref 5–15)
BUN: 13 mg/dL (ref 8–23)
CO2: 24 mmol/L (ref 22–32)
Calcium: 9 mg/dL (ref 8.9–10.3)
Chloride: 104 mmol/L (ref 98–111)
Creatinine, Ser: 0.81 mg/dL (ref 0.61–1.24)
GFR calc Af Amer: >60 mL/min (ref >60)
GFR calc non Af Amer: >60 mL/min (ref >60)
Glucose, Bld: 96 mg/dL (ref 70–99)
Potassium: 4.1 mmol/L (ref 3.5–5.1)
Sodium: 138 mmol/L (ref 135–145)

## 2019-10-08 MED ORDER — ENOXAPARIN SODIUM 40 MG/0.4ML ~~LOC~~ SOLN: 40.0000 mg | SUBCUTANEOUS | Status: DC

## 2019-10-08 NOTE — Progress Notes (Signed)
Pt states " nobody likes me...im scared im going to die"....emotional support offered/ pt denies SI at this time/ psych consult ordered / will continue to monitor.

## 2019-10-08 NOTE — TOC Progression Note (Signed)
Transition of Care [facility]) - Progression Note    Patient Details  Name: Jason Patterson MRN: [id number] Date of Birth: August 07, 1956  [facility]) CM/SW Contact  Ross Ludwig, [place] Phone Number: 10/08/2019, 12:01 PM  Clinical Narrative:    Patient came to this CSW and he said he is tired of living, and would like the doctors to end it all for him.  CSW talked with patient to find out the reason he is thinking this, and he stated that he is tired of everything, and nobody likes him.  CSW discussed with patient that there are other people who have been in similar situations and they have been helped.  CSW informed patient that CSW is working with his legal guardian and DSS to try to find a new group home for him.  CSW also notified attending physician about patient's statements of wanting to end his life.  Per legal guardian, patient has history of saying things and attempting suicide.  Patient has a long history of placement in behavioral health.  CSW attempted to contact Freda Munro patients legal guardian, left a message on her voice mail, awaiting for call back. CSW to continue to follow patient's progress throughout discharge planning.   Expected Discharge Plan: Group Home Barriers to Discharge: Continued Medical Work up  Expected Discharge Plan and Services Expected Discharge Plan: Group Home In-house Referral: Clinical Social Work Discharge Planning Services: NA   Living arrangements for the past 2 months: Group Home                           HH Arranged: NA [place] Agency: NA         Social Determinants of Health (SDOH) Interventions    Readmission Risk Interventions No flowsheet data found.

## 2019-10-08 NOTE — Progress Notes (Signed)
PROGRESS NOTE  Jason Patterson DGU:[id number] DOB: 04/02/1956 DOA: 10/03/2019 PCP: [name], NP  HPI/Recap of past 14 hours: 63 year old male with past medical history of COPD, bipolar disorder, schizoaffective disorder, hypertension and seizure disorder presented to the emergency room on 11/15 with complaints of left-sided chest pain.  Troponin was elevated on admission and patient started on heparin.  Chest x-ray and EKG unrevealing.  Cardiology consulted but he was ruled out for non-STEMI and heparin discontinued.  No evidence of PE by CT.  Plan was for patient to get EGD by GI, but patient became agitated due to being n.p.o. and will drink water, EGD canceled.  Barium swallow done after unremarkable.  Patient ready for discharge, however his group home does not want to take him back.  [facility] looking for placement.  Patient is overdue for ECT.  Psychiatry consulted.  Patient with no complaints to me.  Denies any chest pain or shortness of breath.  However, later on this afternoon, he did tell the social worker that he was tired of living and wanted to kill himself.  Apparently, the patient does have a previous history of doing this and has required inpatient placement in the past.  Assessment/Plan: Active Problems: Chest pain: Noncardiac, non-STEMI now ruled out.  Possibly esophagitis, since resolved.  2D echo unrevealing.  Elevated D-dimer, but CT angiogram negative for PE.  Noted circumferential esophageal wall thickening suggestive of esophagitis, but patient did not agree to being n.p.o. for EGD.  Barium swallow unrevealing.  Also possible, chest pain could be musculoskeletal given that it was reproducible with palpation and movement.  It appears to have resolved since.  Essential hypertension: Continue metoprolol, blood pressure stable.  Anemia of chronic disease: Patient's hemoglobin dropped from 10.9 on admission down to 7.6.  However some of this may be more from  hemoconcentration.  He was on a heparin drip for this 24 hours.  Received 1 unit packed red blood cells on 11/17.  Hemoglobin has since improved and remained stable.  Bipolar disorder/schizoaffective disorder: Patient now threatening suicide.  He was previously given ECT as an outpatient.  Psychiatry consulted.  Have asked them to resee patient following suicidal threats.  BPH: Continue Flomax.   Code Status: Full code  Family Communication: Left message for sister  Disposition Plan: Needs new facility versus inpatient.   Consultants:  Psychiatry  Cardiology  Procedures:  Echocardiogram noting ejection fraction 55-60%.  No evidence of diastolic dysfunction.  Trace mitral valve regurgitation.  Otherwise no valvular dysfunction.  No evidence of diastolic dysfunction.  Status post 1 unit packed red blood cell transfusion 11/17  Antimicrobials:  None  DVT prophylaxis: Lovenox which patient refuses.  Ambulatory   Objective: Vitals:   10/08/19 0625 10/08/19 0731  BP: (!) 91/58 97/67  Pulse: 70 72  Resp: 18 19  Temp: 98.4 F (36.9 C) 98.1 F (36.7 C)  SpO2: 97% 98%    Intake/Output Summary (Last 24 hours) at 10/08/2019 1522 Last data filed at 10/08/2019 0100 Gross per 24 hour  Intake -  Output 0 ml  Net 0 ml   Filed Weights   10/06/19 0532 10/07/19 0632 10/08/19 0625  Weight: 69.5 kg 69.4 kg 68.9 kg   Body mass index is 23.78 kg/m.  Exam:   General: Alert and oriented x2, no acute distress  HEENT: Normocephalic and atraumatic, mucous membranes are moist  Neck: Supple, no JVD  Cardiovascular: Regular rate and rhythm, S1-S2  Respiratory: Clear to auscultation bilaterally  Abdomen:  Soft, nontender, nondistended, positive bowel sounds  Musculoskeletal: Soft, nontender, nondistended, positive bowel sounds  Skin: No skin breaks, tears or lesions  Psychiatry: No evidence of acute psychosis, but flattened affect   Data Reviewed: CBC: Recent Labs   Lab 10/03/19 0141  10/04/19 0407 10/05/19 0450 10/06/19 0611 10/07/19 0919 10/08/19 0718  WBC 13.0*   < > 9.7 5.6 4.4 5.3 6.2  NEUTROABS 10.2*  --   --   --   --   --   --   HGB 10.9*   < > 9.0* 7.6* 8.9* 12.0* 10.8*  HCT 32.7*   < > 25.3* 21.8* 26.5* 35.0* 32.5*  MCV 94.5   < > 90.4 90.5 92.3 92.1 95.0  PLT 223   < > 169 156 178 153 240   < > = values in this interval not displayed.   Basic Metabolic Panel: Recent Labs  Lab 10/04/19 0407 10/05/19 0450 10/06/19 0611 10/07/19 0919 10/08/19 0718  NA 133* 136 138 137 138  K 3.4* 3.4* 3.6 4.4 4.1  CL 103 107 111 104 104  CO2 22 21* 22 22 24   GLUCOSE 126* 99 90 74 96  BUN 15 12 11 13 13   CREATININE 0.71 0.85 0.76 0.88 0.81  CALCIUM 8.3* 8.3* 8.4* 9.2 9.0   GFR: Estimated Creatinine Clearance: 86.1 mL/min (by C-G formula based on SCr of 0.81 mg/dL). Liver Function Tests: Recent Labs  Lab 10/03/19 0141 10/04/19 0407  AST 18 17  ALT 14 11  ALKPHOS 60 54  BILITOT 0.6 0.6  PROT 6.0* 5.4*  ALBUMIN 3.4* 3.1*   Recent Labs  Lab 10/03/19 0141  LIPASE 24   No results for input(s): AMMONIA in the last 168 hours. Coagulation Profile: Recent Labs  Lab 10/03/19 0345 10/03/19 2004  INR 1.2 1.2   Cardiac Enzymes: No results for input(s): CKTOTAL, CKMB, CKMBINDEX, TROPONINI in the last 168 hours. BNP (last 3 results) No results for input(s): PROBNP in the last 8760 hours. HbA1C: No results for input(s): HGBA1C in the last 72 hours. CBG: No results for input(s): GLUCAP in the last 168 hours. Lipid Profile: No results for input(s): CHOL, HDL, LDLCALC, TRIG, CHOLHDL, LDLDIRECT in the last 72 hours. Thyroid Function Tests: No results for input(s): TSH, T4TOTAL, FREET4, T3FREE, THYROIDAB in the last 72 hours. Anemia Panel: No results for input(s): VITAMINB12, FOLATE, FERRITIN, TIBC, IRON, RETICCTPCT in the last 72 hours. Urine analysis:    Component Value Date/Time   COLORURINE YELLOW 08/27/2018 1700   APPEARANCEUR  CLOUDY (A) 08/27/2018 1700   APPEARANCEUR Clear 04/28/2012 1940   LABSPEC 1.008 08/27/2018 1700   LABSPEC 1.006 04/28/2012 1940   PHURINE 6.0 08/27/2018 1700   GLUCOSEU 50 (A) 08/27/2018 1700   GLUCOSEU Negative 04/28/2012 1940   HGBUR LARGE (A) 08/27/2018 1700   BILIRUBINUR NEGATIVE 08/27/2018 1700   BILIRUBINUR Negative 04/28/2012 1940   KETONESUR NEGATIVE 08/27/2018 1700   PROTEINUR NEGATIVE 08/27/2018 1700   UROBILINOGEN 0.2 05/31/2015 0220   NITRITE NEGATIVE 08/27/2018 1700   LEUKOCYTESUR NEGATIVE 08/27/2018 1700   LEUKOCYTESUR Negative 04/28/2012 1940   Sepsis Labs: @LABRCNTIP (procalcitonin:4,lacticidven:4)  ) Recent Results (from the past 240 hour(s))  SARS CORONAVIRUS 2 (TAT 6-24 HRS) Nasopharyngeal Nasopharyngeal Swab     Status: None   Collection Time: 10/03/19  1:48 AM   Specimen: Nasopharyngeal Swab  Result Value Ref Range Status   SARS Coronavirus 2 NEGATIVE NEGATIVE Final    Comment: (NOTE) SARS-CoV-2 target nucleic acids are NOT DETECTED. The SARS-CoV-2  RNA is generally detectable in upper and lower respiratory specimens during the acute phase of infection. Negative results do not preclude SARS-CoV-2 infection, do not rule out co-infections with other pathogens, and should not be used as the sole basis for treatment or other patient management decisions. Negative results must be combined with clinical observations, patient history, and epidemiological information. The expected result is Negative. Fact Sheet for Patients: HairSlick.nohttps://www.fda.gov/media/138098/download Fact Sheet for Healthcare Providers: quierodirigir.comhttps://www.fda.gov/media/138095/download This test is not yet approved or cleared by the [place] FDA and  has been authorized for detection and/or diagnosis of SARS-CoV-2 by FDA under an Emergency Use Authorization (EUA). This EUA will remain  in effect (meaning this test can be used) for the duration of the COVID-19 declaration under Section 56 4(b)(1)  of the Act, 21 U.S.C. section 360bbb-3(b)(1), unless the authorization is terminated or revoked sooner. Performed at [facility], [address] [place], [place] [address]       Studies: No results found.  Scheduled Meds: . aspirin EC  81 mg Oral Daily  . atorvastatin  40 mg Oral q1800  . cholecalciferol  2,000 Units Oral Daily  . famotidine  10 mg Oral BID  . latanoprost  1 drop Both Eyes QHS  . levETIRAcetam  1,000 mg Oral BID  . loxapine  25 mg Oral TID  . metoprolol tartrate  12.5 mg Oral BID  . OLANZapine  20 mg Oral QHS  . senna-docusate  2 tablet Oral BID  . sucralfate  1 g Oral TID WC & HS    Continuous Infusions:   LOS: 5 days     Hollice EspySendil K Donelda Mailhot, MD [facility]  To reach me or the doctor on call, go to: [email or web address] Password [email or web address]  10/08/2019, 3:22 PM

## 2019-10-08 NOTE — Progress Notes (Signed)
Patient Name: Jason Patterson Date of Encounter: 10/08/2019  Hospital Problem List     Active Problems:   Elevated troponin    Patient Profile     Heston Widener Blanchardis a 63 y.o.malewith history ofCOPD, bipolar disorder, schizoaffective disorder, hypertension and seizure disorder who presented to the emergency room with complaints of left-sided chest pain. Felt to be  greater than 10 out of 10 with no radiation. Electrocardiogram was unremarkable for acute injury or ischemia. Serum high-sensitivity troponin was 150. Chest x-ray revealed no acute cardiopulmonary disease. Pain remained atypical, reproduced with deep breathing and with any palpation of the chest anterior and posterior. ECHO showed no wall motion abnormality with normal lv function. Chest CT negative for pe and was suggestive of esophagitis.  Subjective   A little more verbal this am. Denies chest pain to me.   Inpatient Medications    . aspirin EC  81 mg Oral Daily  . atorvastatin  40 mg Oral q1800  . cholecalciferol  2,000 Units Oral Daily  . famotidine  10 mg Oral BID  . latanoprost  1 drop Both Eyes QHS  . levETIRAcetam  1,000 mg Oral BID  . loxapine  25 mg Oral TID  . metoprolol tartrate  12.5 mg Oral BID  . OLANZapine  20 mg Oral QHS  . pantoprazole (PROTONIX) IV  40 mg Intravenous Q12H  . senna-docusate  2 tablet Oral BID  . sucralfate  1 g Oral TID WC & HS    Vital Signs    Vitals:   10/07/19 1952 10/07/19 1953 10/08/19 0625 10/08/19 0731  BP: (!) 83/52 (!) 97/50 (!) 91/58 97/67  Pulse: 67 66 70 72  Resp:   18 19  Temp: 98.1 F (36.7 C)  98.4 F (36.9 C) 98.1 F (36.7 C)  TempSrc: Oral  Oral Oral  SpO2: 98% 96% 97% 98%  Weight:   68.9 kg   Height:        Intake/Output Summary (Last 24 hours) at 10/08/2019 0843 Last data filed at 10/08/2019 0100 Gross per 24 hour  Intake 240 ml  Output 0 ml  Net 240 ml   Filed Weights   10/06/19 0532 10/07/19 0632 10/08/19 0625   Weight: 69.5 kg 69.4 kg 68.9 kg    Physical Exam    GEN: Well nourished, well developed, in no acute distress.  HEENT: normal.  Neck: Supple, no JVD, carotid bruits, or masses. Cardiac: RRR, no murmurs, rubs, or gallops. No clubbing, cyanosis, edema.  Radials/DP/PT 2+ and equal bilaterally.  Respiratory:  Respirations regular and unlabored, clear to auscultation bilaterally. GI: Soft, nontender, nondistended, BS + x 4. MS: no deformity or atrophy. Skin: warm and dry, no rash. Neuro:  Strength and sensation are intact. Psych: Normal affect.  Labs    CBC Recent Labs    10/07/19 0919 10/08/19 0718  WBC 5.3 6.2  HGB 12.0* 10.8*  HCT 35.0* 32.5*  MCV 92.1 95.0  PLT 153 503   Basic Metabolic Panel Recent Labs    10/07/19 0919 10/08/19 0718  NA 137 138  K 4.4 4.1  CL 104 104  CO2 22 24  GLUCOSE 74 96  BUN 13 13  CREATININE 0.88 0.81  CALCIUM 9.2 9.0   Liver Function Tests No results for input(s): AST, ALT, ALKPHOS, BILITOT, PROT, ALBUMIN in the last 72 hours. No results for input(s): LIPASE, AMYLASE in the last 72 hours. Cardiac Enzymes No results for input(s): CKTOTAL, CKMB, CKMBINDEX, TROPONINI in the  last 72 hours. BNP No results for input(s): BNP in the last 72 hours. D-Dimer No results for input(s): DDIMER in the last 72 hours. Hemoglobin A1C No results for input(s): HGBA1C in the last 72 hours. Fasting Lipid Panel No results for input(s): CHOL, HDL, LDLCALC, TRIG, CHOLHDL, LDLDIRECT in the last 72 hours. Thyroid Function Tests No results for input(s): TSH, T4TOTAL, T3FREE, THYROIDAB in the last 72 hours.  Invalid input(s): FREET3  Telemetry    nsr  ECG    nsr with no ischemia  Radiology    Ct Angio Chest Pe W Or Wo Contrast  Result Date: 10/04/2019 CLINICAL DATA:  Positive D-dimer, PE suspected, sudden onset left chest pain tightness, 10/10 in severity EXAM: CT ANGIOGRAPHY CHEST WITH CONTRAST TECHNIQUE: Multidetector CT imaging of the chest  was performed using the standard protocol during bolus administration of intravenous contrast. Multiplanar CT image reconstructions and MIPs were obtained to evaluate the vascular anatomy. CONTRAST:  59mL OMNIPAQUE IOHEXOL 350 MG/ML SOLN COMPARISON:  CTA chest 08/27/2018 FINDINGS: Cardiovascular: Satisfactory opacification the pulmonary arteries to the segmental level. No pulmonary arterial filling defects are identified. Central pulmonary arteries are normal caliber. Flattening of the intraventricular septum and right cardiac enlargement is similar to comparison CTs. Coronary artery calcifications are present. The aorta is minimally calcified but otherwise unremarkable and normal caliber. Normal 3 vessel branching of the arch. Mediastinum/Nodes: There is circumferential esophageal wall thickening and a small to moderate hiatal hernia with a small amount of adjacent lower mediastinal low-attenuation fluid and reactive adenopathy. Diffuse secretions are seen throughout the airways and trachea. No pathologically enlarged mediastinal, hilar or axillary nodes. Thyroid gland and thoracic inlet are unremarkable. Lungs/Pleura: Diffuse paraseptal and centrilobular emphysematous changes are present throughout the lungs. Scattered calcified granulomata are noted. Evaluation for nodules and masses is somewhat limited due to respiratory motion artifact though no concerning focal abnormality is seen. There is diffuse airways thickening. No pneumothorax. No effusion. Upper Abdomen: No acute abnormalities present in the visualized portions of the upper abdomen. Musculoskeletal: Remote posttraumatic deformity of the superior third sternum Multilevel degenerative changes are present in the imaged portions of the spine. No acute osseous abnormality or suspicious osseous lesion. No suspicious chest wall lesions. Review of the MIP images confirms the above findings. IMPRESSION: 1. No evidence of pulmonary embolism. 2. Circumferential  esophageal wall thickening and a small to moderate hiatal hernia with adjacent lower mediastinal low-attenuation fluid and reactive adenopathy, suggestive of esophagitis. Consider correlation with endoscopy. 3. Chronic right atrial and ventricular enlargement with slight flattening of the septum may reflect elevated right heart pressures. 4. Diffuse secretions throughout the airways and trachea. 5. Sequela of remote granulomatous disease. 6. Emphysema (ICD10-J43.9). 7. Aortic Atherosclerosis (ICD10-I70.0) Electronically Signed   By: Kreg Shropshire M.D.   On: 10/04/2019 15:43   Dg Chest Port 1 View  Result Date: 10/03/2019 CLINICAL DATA:  Chest pain EXAM: PORTABLE CHEST 1 VIEW COMPARISON:  02/08/2019 FINDINGS: Heart and mediastinal contours are within normal limits. No focal opacities or effusions. No acute bony abnormality. Old healed left rib fractures. IMPRESSION: No active disease. Electronically Signed   By: Charlett Nose M.D.   On: 10/03/2019 01:57   Dg Esophagus W Double Cm (hd)  Result Date: 10/06/2019 CLINICAL DATA:  Chest discomfort, heartburn EXAM: ESOPHOGRAM / BARIUM SWALLOW / BARIUM TABLET STUDY TECHNIQUE: Combined double contrast and single contrast examination performed using effervescent crystals, thick barium liquid, and thin barium liquid. The patient was observed with fluoroscopy swallowing a 13  mm barium sulphate tablet. FLUOROSCOPY TIME:  Fluoroscopy Time:  1.1 minute Radiation Exposure Index (if provided by the fluoroscopic device): 8.4 mGy Number of Acquired Spot Images: 0 COMPARISON:  None. FINDINGS: Degenerative disc disease with disc height loss at C4-5, C5-6 and C6-7 on the lateral view. Normal pharyngeal anatomy and motility. Contrast flowed freely through the esophagus without evidence of a stricture or mass. Normal esophageal mucosa without evidence of irregularity or ulceration. Esophageal motility was normal. No evidence of reflux. No definite hiatal hernia was demonstrated. At  the end of the examination a 13 mm barium tablet was administered which transited through the esophagus and esophagogastric junction without delay. IMPRESSION: Somewhat limited examination as the patient was moving during the the entirety of the exam. No large esophageal stricture, mass, dysmotility or gastroesophageal reflux. Electronically Signed   By: Elige KoHetal  Patel   On: 10/06/2019 10:48    Assessment & Plan    1.Chest pain-veryatypical featuresbut still is present.Non verbal today.  Echocardiogramrevealed normal lv function with no wall motion abnormalities. No significant valvular abnormalities.Chest CT with contrast revealed no pulmonary embolus with moderate emphysema. There werescattered coronary calcifications. No aneurysm. No dissection.  Elevated troponin is likely multifactorialand very atypical of ACS.Marland Kitchen. Symptoms are extremely atypical for acute coronary syndrome.Will continue with current meds including asa at 81 mg daily, atorvastatin 40 mg daily, metoprolol tartrate 12.5 mg bid.  Pain appears to be musculoskeletal. Not candidate for invasive evaluation.   2.Complaint of diarrhea. stable at present.  3.Hypertension-continue with metoprololat 12.5 mg twice daily, blood pressure is controlled at present was97/67this morning. Heart rate was80.  4.Tobacco abuse-smoking cessationdiscussed.  5.Schizoaffective/bipolar disorder.-Is on Abilify and Seroquel.We will continue with this for now and follow-up for decompensation.  6.Seizure disorder-on Keppra.No evidence of seizure activity  7.Anxiety.Anxiolytics as needed.  8. Esophagitis- GI workup in place.  9. Anemia-transfusion. No obvious bleeding   Signed, Darlin PriestlyKenneth A. Willie Plain MD 10/08/2019, 8:43 AM  Pager: [phone]

## 2019-10-09 DIAGNOSIS — F411 Generalized anxiety disorder: Secondary | ICD-10-CM | POA: Diagnosis not present

## 2019-10-09 DIAGNOSIS — F250 Schizoaffective disorder, bipolar type: Secondary | ICD-10-CM

## 2019-10-09 LAB — BASIC METABOLIC PANEL
Anion gap: 8 (ref 5–15)
BUN: 13 mg/dL (ref 8–23)
CO2: 21 mmol/L — ABNORMAL LOW (ref 22–32)
Calcium: 8.6 mg/dL — ABNORMAL LOW (ref 8.9–10.3)
Chloride: 104 mmol/L (ref 98–111)
Creatinine, Ser: 0.8 mg/dL (ref 0.61–1.24)
GFR calc Af Amer: >60 mL/min (ref >60)
GFR calc non Af Amer: >60 mL/min (ref >60)
Glucose, Bld: 95 mg/dL (ref 70–99)
Potassium: 4.3 mmol/L (ref 3.5–5.1)
Sodium: 133 mmol/L — ABNORMAL LOW (ref 135–145)

## 2019-10-09 LAB — CBC
HCT: 29 % — ABNORMAL LOW (ref 39.0–52.0)
Hemoglobin: 10.1 g/dL — ABNORMAL LOW (ref 13.0–17.0)
MCH: 31.2 pg (ref 26.0–34.0)
MCHC: 34.8 g/dL (ref 30.0–36.0)
MCV: 89.5 fL (ref 80.0–100.0)
Platelets: 248 10*3/uL (ref 150–400)
RBC: 3.24 MIL/uL — ABNORMAL LOW (ref 4.22–5.81)
RDW: 14.2 % (ref 11.5–15.5)
WBC: 6.7 10*3/uL (ref 4.0–10.5)
nRBC: 0 % (ref 0.0–0.2)

## 2019-10-09 MED ORDER — SODIUM CHLORIDE 0.9% FLUSH
3.0000 mL | Freq: Two times a day (BID) | INTRAVENOUS | Status: DC
  Administered 2019-10-09 – 2019-10-30 (×37): 3 mL via INTRAVENOUS

## 2019-10-09 NOTE — Progress Notes (Signed)
[facility] Cardiology [facility] Encounter Note  Patient: Jason Patterson / Admit Date: 10/03/2019 / Date of Encounter: 10/09/2019, 6:09 AM   Subjective: Patient slept well last night no evidence of chest pain overnight or this morning.  No evidence of congestive heart failure or other significant cardiovascular symptoms.  Patient has had minimal elevation of troponin to 150 consistent with demand ischemia rather than acute coronary syndrome.  Patient tolerating metoprolol and aspirin with atorvastatin without issue  Review of Systems: Positive for: None Negative for: Vision change, hearing change, syncope, dizziness, nausea, vomiting,diarrhea, bloody stool, stomach pain, cough, congestion, diaphoresis, urinary frequency, urinary pain,skin lesions, skin rashes Others previously listed  Objective: Telemetry: Normal sinus rhythm Physical Exam: Blood pressure 109/75, pulse 80, temperature 98.4 F (36.9 C), temperature source Oral, resp. rate 19, height 5\' 7"  (1.702 m), weight 69.9 kg, SpO2 97 %. Body mass index is 24.12 kg/m. General: Well developed, well nourished, in no acute distress. Head: Normocephalic, atraumatic, sclera non-icteric, no xanthomas, nares are without discharge. Neck: No apparent masses Lungs: Normal respirations with no wheezes, no rhonchi, no rales , no crackles   Heart: Regular rate and rhythm, normal S1 S2, no murmur, no rub, no gallop, PMI is normal size and placement, carotid upstroke normal without bruit, jugular venous pressure normal Abdomen: Soft, non-tender, non-distended with normoactive bowel sounds. No hepatosplenomegaly. Abdominal aorta is normal size without bruit Extremities: No edema, no clubbing, no cyanosis, no ulcers,  Peripheral: 2+ radial, 2+ femoral, 2+ dorsal pedal pulses Neuro: Alert and oriented. Moves all extremities spontaneously. Psych:  Responds to questions appropriately with a normal affect.   Intake/Output Summary (Last 24  hours) at 10/09/2019 6387 Last data filed at 10/09/2019 0122 Gross per 24 hour  Intake -  Output 0 ml  Net 0 ml    Inpatient Medications:  . aspirin EC  81 mg Oral Daily  . atorvastatin  40 mg Oral q1800  . cholecalciferol  2,000 Units Oral Daily  . famotidine  10 mg Oral BID  . latanoprost  1 drop Both Eyes QHS  . levETIRAcetam  1,000 mg Oral BID  . loxapine  25 mg Oral TID  . metoprolol tartrate  12.5 mg Oral BID  . OLANZapine  20 mg Oral QHS  . senna-docusate  2 tablet Oral BID  . sucralfate  1 g Oral TID WC & HS   Infusions:   Labs: Recent Labs    10/08/19 0718 10/09/19 0458  NA 138 133*  K 4.1 4.3  CL 104 104  CO2 24 21*  GLUCOSE 96 95  BUN 13 13  CREATININE 0.81 0.80  CALCIUM 9.0 8.6*   No results for input(s): AST, ALT, ALKPHOS, BILITOT, PROT, ALBUMIN in the last 72 hours. Recent Labs    10/08/19 0718 10/09/19 0458  WBC 6.2 6.7  HGB 10.8* 10.1*  HCT 32.5* 29.0*  MCV 95.0 89.5  PLT 240 248   No results for input(s): CKTOTAL, CKMB, TROPONINI in the last 72 hours. Invalid input(s): POCBNP No results for input(s): HGBA1C in the last 72 hours.   Weights: Filed Weights   10/07/19 5643 10/08/19 0625 10/09/19 0508  Weight: 69.4 kg 68.9 kg 69.9 kg     Radiology/Studies:  Ct Angio Chest Pe W Or Wo Contrast  Result Date: 10/04/2019 CLINICAL DATA:  Positive D-dimer, PE suspected, sudden onset left chest pain tightness, 10/10 in severity EXAM: CT ANGIOGRAPHY CHEST WITH CONTRAST TECHNIQUE: Multidetector CT imaging of the chest was performed using the  standard protocol during bolus administration of intravenous contrast. Multiplanar CT image reconstructions and MIPs were obtained to evaluate the vascular anatomy. CONTRAST:  75mL OMNIPAQUE IOHEXOL 350 MG/ML SOLN COMPARISON:  CTA chest 08/27/2018 FINDINGS: Cardiovascular: Satisfactory opacification the pulmonary arteries to the segmental level. No pulmonary arterial filling defects are identified. Central  pulmonary arteries are normal caliber. Flattening of the intraventricular septum and right cardiac enlargement is similar to comparison CTs. Coronary artery calcifications are present. The aorta is minimally calcified but otherwise unremarkable and normal caliber. Normal 3 vessel branching of the arch. Mediastinum/Nodes: There is circumferential esophageal wall thickening and a small to moderate hiatal hernia with a small amount of adjacent lower mediastinal low-attenuation fluid and reactive adenopathy. Diffuse secretions are seen throughout the airways and trachea. No pathologically enlarged mediastinal, hilar or axillary nodes. Thyroid gland and thoracic inlet are unremarkable. Lungs/Pleura: Diffuse paraseptal and centrilobular emphysematous changes are present throughout the lungs. Scattered calcified granulomata are noted. Evaluation for nodules and masses is somewhat limited due to respiratory motion artifact though no concerning focal abnormality is seen. There is diffuse airways thickening. No pneumothorax. No effusion. Upper Abdomen: No acute abnormalities present in the visualized portions of the upper abdomen. Musculoskeletal: Remote posttraumatic deformity of the superior third sternum Multilevel degenerative changes are present in the imaged portions of the spine. No acute osseous abnormality or suspicious osseous lesion. No suspicious chest wall lesions. Review of the MIP images confirms the above findings. IMPRESSION: 1. No evidence of pulmonary embolism. 2. Circumferential esophageal wall thickening and a small to moderate hiatal hernia with adjacent lower mediastinal low-attenuation fluid and reactive adenopathy, suggestive of esophagitis. Consider correlation with endoscopy. 3. Chronic right atrial and ventricular enlargement with slight flattening of the septum may reflect elevated right heart pressures. 4. Diffuse secretions throughout the airways and trachea. 5. Sequela of remote granulomatous  disease. 6. Emphysema (ICD10-J43.9). 7. Aortic Atherosclerosis (ICD10-I70.0) Electronically Signed   By: Kreg ShropshirePrice  DeHay M.D.   On: 10/04/2019 15:43   Dg Chest Port 1 View  Result Date: 10/03/2019 CLINICAL DATA:  Chest pain EXAM: PORTABLE CHEST 1 VIEW COMPARISON:  02/08/2019 FINDINGS: Heart and mediastinal contours are within normal limits. No focal opacities or effusions. No acute bony abnormality. Old healed left rib fractures. IMPRESSION: No active disease. Electronically Signed   By: Charlett NoseKevin  Dover M.D.   On: 10/03/2019 01:57   Dg Esophagus W Double Cm (hd)  Result Date: 10/06/2019 CLINICAL DATA:  Chest discomfort, heartburn EXAM: ESOPHOGRAM / BARIUM SWALLOW / BARIUM TABLET STUDY TECHNIQUE: Combined double contrast and single contrast examination performed using effervescent crystals, thick barium liquid, and thin barium liquid. The patient was observed with fluoroscopy swallowing a 13 mm barium sulphate tablet. FLUOROSCOPY TIME:  Fluoroscopy Time:  1.1 minute Radiation Exposure Index (if provided by the fluoroscopic device): 8.4 mGy Number of Acquired Spot Images: 0 COMPARISON:  None. FINDINGS: Degenerative disc disease with disc height loss at C4-5, C5-6 and C6-7 on the lateral view. Normal pharyngeal anatomy and motility. Contrast flowed freely through the esophagus without evidence of a stricture or mass. Normal esophageal mucosa without evidence of irregularity or ulceration. Esophageal motility was normal. No evidence of reflux. No definite hiatal hernia was demonstrated. At the end of the examination a 13 mm barium tablet was administered which transited through the esophagus and esophagogastric junction without delay. IMPRESSION: Somewhat limited examination as the patient was moving during the the entirety of the exam. No large esophageal stricture, mass, dysmotility or gastroesophageal reflux. Electronically Signed  By: Elige Ko   On: 10/06/2019 10:48     Assessment and Recommendation  63  y.o. male with known hypertension hyperlipidemia and atypical chest discomfort without evidence of myocardial infarction or congestive heart failure 1.  Continue metoprolol medication management for hypertension control and risk reduction in cardiovascular event 2.  No change in aspirin for further risk reduction 3.  High intensity cholesterol therapy without change today 4.  Begin ambulation and follow for improvements of symptoms but no further diagnostic testing necessary at this time due to atypical chest discomfort and no evidence of myocardial infarction 5.  Okay for discharged home from cardiac standpoint with follow-up in 1 to 2 weeks for further adjustments of medication management 6.  Call if further questions  Signed, Arnoldo Hooker M.D. FACC

## 2019-10-09 NOTE — Progress Notes (Addendum)
Patient verbalized to nurse tech that he was having suicidal thoughts. This RN went in to assess patient. Asked what he was feeling. Patient verbalized that he was scared and was having thoughts of hurting himself. I asked if he had spoken to the doctor today and he stated no. Then he verbalized that there was nothing in there room to hurt himself with so he could not hurt himself. NP Waylan Boga paged to notify. Per NP he was not having suicidal thoughts when she assessed him. Notified her of what he told nursing staff. No new orders.   Update 1706: Patient states he is not having any suicidal thoughts right now. Resting in bed after eating dinner. Evening meds given. No concerns at this time. Will continue to monitor.
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
## 2019-10-09 NOTE — Progress Notes (Signed)
PROGRESS NOTE  Jason Patterson ZHY:[id number] DOB: 1956/01/25 DOA: 10/03/2019 PCP: Koren BoundMatrone, Andrew, NP  HPI/Recap of past 224 hours: 63 year old male with past medical history of COPD, bipolar disorder, schizoaffective disorder, hypertension and seizure disorder presented to the emergency room on 11/15 with complaints of left-sided chest pain.  Troponin was elevated on admission and patient started on heparin.  Chest x-ray and EKG unrevealing.  Cardiology consulted but he was ruled out for non-STEMI and heparin discontinued.  No evidence of PE by CT.  Plan was for patient to get EGD by GI, but patient became agitated due to being n.p.o. and will drink water, EGD canceled.  Barium swallow done after unremarkable.  Patient ready for discharge, however his group home does not want to take him back.  [facility] looking for placement.  Patient is overdue for ECT.  Psychiatry consulted.  On afternoon of 11/20, patient told the social worker that he was tired of living and wanted to kill himself.  Apparently, the patient does have a previous history of doing this and has required inpatient placement in the past.  Psychiatry consulted who saw patient on 11/21 and felt that he was not a suicide risk.    Patient complains of a headache.  He says he just feels depressed and feels that nobody cares about him.  I tried to reassure him.  Assessment/Plan: Active Problems: Chest pain: Noncardiac, non-STEMI now ruled out.  Possibly esophagitis, since resolved.  2D echo unrevealing.  Elevated D-dimer, but CT angiogram negative for PE.  Noted circumferential esophageal wall thickening suggestive of esophagitis, but patient did not agree to being n.p.o. for EGD.  Barium swallow unrevealing.  Also possible, chest pain could be musculoskeletal given that it was reproducible with palpation and movement.  It appears to have resolved since.  Essential hypertension: Continue metoprolol, blood pressure stable.  Anemia of  chronic disease: Patient's hemoglobin dropped from 10.9 on admission down to 7.6.  However some of this may be more from hemoconcentration.  He was on a heparin drip for this 24 hours.  Received 1 unit packed red blood cells on 11/17.  Hemoglobin has since improved and remained stable.  Bipolar disorder/schizoaffective disorder: Patient now threatening suicide.  He was previously given ECT as an outpatient.  Psychiatry followed up and feels patient is not suicidal risk.  BPH: Continue Flomax.   Code Status: Full code  Family Communication: Left message for sister  Disposition Plan: Needs new facility.   Consultants:  Psychiatry  Cardiology  Procedures:  Echocardiogram noting ejection fraction 55-60%.  No evidence of diastolic dysfunction.  Trace mitral valve regurgitation.  Otherwise no valvular dysfunction.  No evidence of diastolic dysfunction.  Status post 1 unit packed red blood cell transfusion 11/17  Antimicrobials:  None  DVT prophylaxis: Lovenox which patient refuses.  Ambulatory   Objective: Vitals:   10/09/19 0810 10/09/19 1001  BP: 108/80 105/72  Pulse: 81 73  Resp: 18   Temp: 97.7 F (36.5 C)   SpO2: 100%     Intake/Output Summary (Last 24 hours) at 10/09/2019 1444 Last data filed at 10/09/2019 1300 Gross per 24 hour  Intake 240 ml  Output 0 ml  Net 240 ml   Filed Weights   10/07/19 0632 10/08/19 0625 10/09/19 0508  Weight: 69.4 kg 68.9 kg 69.9 kg   Body mass index is 24.12 kg/m.  Exam:   General: Alert and oriented x2, fatigued  HEENT: Normocephalic and atraumatic, mucous membranes are moist  Neck:  Supple, no JVD  Cardiovascular: Regular rate and rhythm, S1-S2  Respiratory: Clear to auscultation bilaterally  Abdomen: Soft, nontender, nondistended, positive bowel sounds  Musculoskeletal: Soft, nontender, nondistended, positive bowel sounds  Skin: No skin breaks, tears or lesions  Psychiatry: No evidence of acute psychosis, but  flattened affect   Data Reviewed: CBC: Recent Labs  Lab 10/03/19 0141  10/05/19 0450 10/06/19 0611 10/07/19 0919 10/08/19 0718 10/09/19 0458  WBC 13.0*   < > 5.6 4.4 5.3 6.2 6.7  NEUTROABS 10.2*  --   --   --   --   --   --   HGB 10.9*   < > 7.6* 8.9* 12.0* 10.8* 10.1*  HCT 32.7*   < > 21.8* 26.5* 35.0* 32.5* 29.0*  MCV 94.5   < > 90.5 92.3 92.1 95.0 89.5  PLT 223   < > 156 178 153 240 248   < > = values in this interval not displayed.   Basic Metabolic Panel: Recent Labs  Lab 10/05/19 0450 10/06/19 0611 10/07/19 0919 10/08/19 0718 10/09/19 0458  NA 136 138 137 138 133*  K 3.4* 3.6 4.4 4.1 4.3  CL 107 111 104 104 104  CO2 21* 22 22 24  21*  GLUCOSE 99 90 74 96 95  BUN 12 11 13 13 13   CREATININE 0.85 0.76 0.88 0.81 0.80  CALCIUM 8.3* 8.4* 9.2 9.0 8.6*   GFR: Estimated Creatinine Clearance: 87.2 mL/min (by C-G formula based on SCr of 0.8 mg/dL). Liver Function Tests: Recent Labs  Lab 10/03/19 0141 10/04/19 0407  AST 18 17  ALT 14 11  ALKPHOS 60 54  BILITOT 0.6 0.6  PROT 6.0* 5.4*  ALBUMIN 3.4* 3.1*   Recent Labs  Lab 10/03/19 0141  LIPASE 24   No results for input(s): AMMONIA in the last 168 hours. Coagulation Profile: Recent Labs  Lab 10/03/19 0345 10/03/19 2004  INR 1.2 1.2   Cardiac Enzymes: No results for input(s): CKTOTAL, CKMB, CKMBINDEX, TROPONINI in the last 168 hours. BNP (last 3 results) No results for input(s): PROBNP in the last 8760 hours. HbA1C: No results for input(s): HGBA1C in the last 72 hours. CBG: No results for input(s): GLUCAP in the last 168 hours. Lipid Profile: No results for input(s): CHOL, HDL, LDLCALC, TRIG, CHOLHDL, LDLDIRECT in the last 72 hours. Thyroid Function Tests: No results for input(s): TSH, T4TOTAL, FREET4, T3FREE, THYROIDAB in the last 72 hours. Anemia Panel: No results for input(s): VITAMINB12, FOLATE, FERRITIN, TIBC, IRON, RETICCTPCT in the last 72 hours. Urine analysis:    Component Value  Date/Time   COLORURINE YELLOW 08/27/2018 1700   APPEARANCEUR CLOUDY (A) 08/27/2018 1700   APPEARANCEUR Clear 04/28/2012 1940   LABSPEC 1.008 08/27/2018 1700   LABSPEC 1.006 04/28/2012 1940   PHURINE 6.0 08/27/2018 1700   GLUCOSEU 50 (A) 08/27/2018 1700   GLUCOSEU Negative 04/28/2012 1940   HGBUR LARGE (A) 08/27/2018 1700   BILIRUBINUR NEGATIVE 08/27/2018 1700   BILIRUBINUR Negative 04/28/2012 1940   KETONESUR NEGATIVE 08/27/2018 1700   PROTEINUR NEGATIVE 08/27/2018 1700   UROBILINOGEN 0.2 05/31/2015 0220   NITRITE NEGATIVE 08/27/2018 1700   LEUKOCYTESUR NEGATIVE 08/27/2018 1700   LEUKOCYTESUR Negative 04/28/2012 1940   Sepsis Labs: @LABRCNTIP (procalcitonin:4,lacticidven:4)  ) Recent Results (from the past 240 hour(s))  SARS CORONAVIRUS 2 (TAT 6-24 HRS) Nasopharyngeal Nasopharyngeal Swab     Status: None   Collection Time: 10/03/19  1:48 AM   Specimen: Nasopharyngeal Swab  Result Value Ref Range Status   SARS Coronavirus  2 NEGATIVE NEGATIVE Final    Comment: (NOTE) SARS-CoV-2 target nucleic acids are NOT DETECTED. The SARS-CoV-2 RNA is generally detectable in upper and lower respiratory specimens during the acute phase of infection. Negative results do not preclude SARS-CoV-2 infection, do not rule out co-infections with other pathogens, and should not be used as the sole basis for treatment or other patient management decisions. Negative results must be combined with clinical observations, patient history, and epidemiological information. The expected result is Negative. Fact Sheet for Patients: HairSlick.no Fact Sheet for Healthcare Providers: quierodirigir.com This test is not yet approved or cleared by the [place] FDA and  has been authorized for detection and/or diagnosis of SARS-CoV-2 by FDA under an Emergency Use Authorization (EUA). This EUA will remain  in effect (meaning this test can be used) for the  duration of the COVID-19 declaration under Section 56 4(b)(1) of the Act, 21 U.S.C. section 360bbb-3(b)(1), unless the authorization is terminated or revoked sooner. Performed at [facility], [address] [place], [place] [address]       Studies: No results found.  Scheduled Meds: . aspirin EC  81 mg Oral Daily  . atorvastatin  40 mg Oral q1800  . cholecalciferol  2,000 Units Oral Daily  . famotidine  10 mg Oral BID  . latanoprost  1 drop Both Eyes QHS  . levETIRAcetam  1,000 mg Oral BID  . loxapine  25 mg Oral TID  . metoprolol tartrate  12.5 mg Oral BID  . OLANZapine  20 mg Oral QHS  . senna-docusate  2 tablet Oral BID  . sucralfate  1 g Oral TID WC & HS    Continuous Infusions:   LOS: 6 days     Hollice Espy, MD [facility]  To reach me or the doctor on call, go to: [email or web address] Password [email or web address]  10/09/2019, 2:44 PM

## 2019-10-10 LAB — CBC
HCT: 36.9 % — ABNORMAL LOW (ref 39.0–52.0)
Hemoglobin: 12 g/dL — ABNORMAL LOW (ref 13.0–17.0)
MCH: 30.8 pg (ref 26.0–34.0)
MCHC: 32.5 g/dL (ref 30.0–36.0)
MCV: 94.6 fL (ref 80.0–100.0)
Platelets: 300 10*3/uL (ref 150–400)
RBC: 3.9 MIL/uL — ABNORMAL LOW (ref 4.22–5.81)
RDW: 14.1 % (ref 11.5–15.5)
WBC: 6.3 10*3/uL (ref 4.0–10.5)
nRBC: 0 % (ref 0.0–0.2)

## 2019-10-10 NOTE — Progress Notes (Signed)
PROGRESS NOTE  Jason Patterson OYD:[id number] DOB: [birth year] DOA: 10/03/2019 PCP: Terrill Mohr, NP  HPI/Recap of past 12 hours: 63 year old male with past medical history of COPD, bipolar disorder, schizoaffective disorder, hypertension and seizure disorder presented to the emergency room on 11/15 with complaints of left-sided chest pain.  Troponin was elevated on admission and patient started on heparin.  Chest x-ray and EKG unrevealing.  Cardiology consulted but he was ruled out for non-STEMI and heparin discontinued.  No evidence of PE by CT.  Plan was for patient to get EGD by GI, but patient became agitated due to being n.p.o. and will drink water, EGD canceled.  Barium swallow done after unremarkable.  Patient ready for discharge, however his group home does not want to take him back.  [facility] looking for placement.  Patient is overdue for ECT.  Psychiatry consulted.  On afternoon of 11/20, patient told the social worker that he was tired of living and wanted to kill himself.  Apparently, the patient does have a previous history of doing this and has required inpatient placement in the past.  Psychiatry consulted who saw patient on 11/21 and felt that he was not a suicide risk.    Patient complains of a headache and feels anxious.  Assessment/Plan: Active Problems: Chest pain: Noncardiac, non-STEMI now ruled out.  Possibly esophagitis, since resolved.  2D echo unrevealing.  Elevated D-dimer, but CT angiogram negative for PE.  Noted circumferential esophageal wall thickening suggestive of esophagitis, but patient did not agree to being n.p.o. for EGD.  Barium swallow unrevealing.  Also possible, chest pain could be musculoskeletal given that it was reproducible with palpation and movement.  It appears to have resolved since.  Essential hypertension: Continue metoprolol, blood pressure stable.  Anemia of chronic disease: Patient's hemoglobin dropped from 10.9 on admission down to 7.6.   However some of this may be more from hemoconcentration.  He was on a heparin drip for this 24 hours.  Received 1 unit packed red blood cells on 11/17.  Hemoglobin has since improved and remained stable.  Bipolar disorder/schizoaffective disorder: Patient now threatening suicide.  He was previously given ECT as an outpatient.  Psychiatry followed up and feels patient is not suicidal risk.  BPH: Continue Flomax.   Code Status: Full code  Family Communication: Left message for power of attorney on Friday  Disposition Plan: Looking at placement options  Consultants:  Psychiatry  Cardiology  Procedures:  Echocardiogram noting ejection fraction 55-60%.  No evidence of diastolic dysfunction.  Trace mitral valve regurgitation.  Otherwise no valvular dysfunction.  No evidence of diastolic dysfunction.  Status post 1 unit packed red blood cell transfusion 11/17  Antimicrobials:  None  DVT prophylaxis: Lovenox which patient refuses.  Ambulatory   Objective: Vitals:   10/10/19 0549 10/10/19 0738  BP: 112/75 128/80  Pulse: 61 70  Resp: 18 16  Temp: 98.4 F (36.9 C) 97.9 F (36.6 C)  SpO2: 100% 100%    Intake/Output Summary (Last 24 hours) at 10/10/2019 1321 Last data filed at 10/09/2019 1700 Gross per 24 hour  Intake 240 ml  Output 0 ml  Net 240 ml   Filed Weights   10/08/19 0625 10/09/19 0508 10/10/19 0601  Weight: 68.9 kg 69.9 kg 67.8 kg   Body mass index is 23.4 kg/m.  Exam: Unchanged from previous day  General: Alert and oriented x2, fatigued  HEENT: Normocephalic and atraumatic, mucous membranes are moist  Cardiovascular: Regular rate and rhythm, S1-S2  Respiratory:  Clear to auscultation bilaterally  Psychiatry: No evidence of acute psychosis, but flattened affect   Data Reviewed: CBC: Recent Labs  Lab 10/06/19 0611 10/07/19 0919 10/08/19 0718 10/09/19 0458 10/10/19 0827  WBC 4.4 5.3 6.2 6.7 6.3  HGB 8.9* 12.0* 10.8* 10.1* 12.0*  HCT 26.5*  35.0* 32.5* 29.0* 36.9*  MCV 92.3 92.1 95.0 89.5 94.6  PLT 178 153 240 248 300   Basic Metabolic Panel: Recent Labs  Lab 10/05/19 0450 10/06/19 0611 10/07/19 0919 10/08/19 0718 10/09/19 0458  NA 136 138 137 138 133*  K 3.4* 3.6 4.4 4.1 4.3  CL 107 111 104 104 104  CO2 21* 22 22 24  21*  GLUCOSE 99 90 74 96 95  BUN 12 11 13 13 13   CREATININE 0.85 0.76 0.88 0.81 0.80  CALCIUM 8.3* 8.4* 9.2 9.0 8.6*   GFR: Estimated Creatinine Clearance: 87.2 mL/min (by C-G formula based on SCr of 0.8 mg/dL). Liver Function Tests: Recent Labs  Lab 10/04/19 0407  AST 17  ALT 11  ALKPHOS 54  BILITOT 0.6  PROT 5.4*  ALBUMIN 3.1*   No results for input(s): LIPASE, AMYLASE in the last 168 hours. No results for input(s): AMMONIA in the last 168 hours. Coagulation Profile: Recent Labs  Lab 10/03/19 2004  INR 1.2   Cardiac Enzymes: No results for input(s): CKTOTAL, CKMB, CKMBINDEX, TROPONINI in the last 168 hours. BNP (last 3 results) No results for input(s): PROBNP in the last 8760 hours. HbA1C: No results for input(s): HGBA1C in the last 72 hours. CBG: No results for input(s): GLUCAP in the last 168 hours. Lipid Profile: No results for input(s): CHOL, HDL, LDLCALC, TRIG, CHOLHDL, LDLDIRECT in the last 72 hours. Thyroid Function Tests: No results for input(s): TSH, T4TOTAL, FREET4, T3FREE, THYROIDAB in the last 72 hours. Anemia Panel: No results for input(s): VITAMINB12, FOLATE, FERRITIN, TIBC, IRON, RETICCTPCT in the last 72 hours. Urine analysis:    Component Value Date/Time   COLORURINE YELLOW 08/27/2018 1700   APPEARANCEUR CLOUDY (A) 08/27/2018 1700   APPEARANCEUR Clear 04/28/2012 1940   LABSPEC 1.008 08/27/2018 1700   LABSPEC 1.006 04/28/2012 1940   PHURINE 6.0 08/27/2018 1700   GLUCOSEU 50 (A) 08/27/2018 1700   GLUCOSEU Negative 04/28/2012 1940   HGBUR LARGE (A) 08/27/2018 1700   BILIRUBINUR NEGATIVE 08/27/2018 1700   BILIRUBINUR Negative 04/28/2012 1940   KETONESUR  NEGATIVE 08/27/2018 1700   PROTEINUR NEGATIVE 08/27/2018 1700   UROBILINOGEN 0.2 05/31/2015 0220   NITRITE NEGATIVE 08/27/2018 1700   LEUKOCYTESUR NEGATIVE 08/27/2018 1700   LEUKOCYTESUR Negative 04/28/2012 1940   Sepsis Labs: @LABRCNTIP (procalcitonin:4,lacticidven:4)  ) Recent Results (from the past 240 hour(s))  SARS CORONAVIRUS 2 (TAT 6-24 HRS) Nasopharyngeal Nasopharyngeal Swab     Status: None   Collection Time: 10/03/19  1:48 AM   Specimen: Nasopharyngeal Swab  Result Value Ref Range Status   SARS Coronavirus 2 NEGATIVE NEGATIVE Final    Comment: (NOTE) SARS-CoV-2 target nucleic acids are NOT DETECTED. The SARS-CoV-2 RNA is generally detectable in upper and lower respiratory specimens during the acute phase of infection. Negative results do not preclude SARS-CoV-2 infection, do not rule out co-infections with other pathogens, and should not be used as the sole basis for treatment or other patient management decisions. Negative results must be combined with clinical observations, patient history, and epidemiological information. The expected result is Negative. Fact Sheet for Patients: 06/28/2012 Fact Sheet for Healthcare Providers: This test is not yet approved or cleared by the 10/05/19 FDA  and  has been authorized for detection and/or diagnosis of SARS-CoV-2 by FDA under an Emergency Use Authorization (EUA). This EUA will remain  in effect (meaning this test can be used) for the duration of the COVID-19 declaration under Section 56 4(b)(1) of the Act, 21 U.S.C. section 360bbb-3(b)(1), unless the authorization is terminated or revoked sooner. Performed at [facility], [address] [place], [place] [address]       Studies: No results found.  Scheduled Meds: . aspirin EC  81 mg Oral Daily  . atorvastatin  40 mg Oral q1800  . cholecalciferol  2,000 Units Oral Daily  .  famotidine  10 mg Oral BID  . latanoprost  1 drop Both Eyes QHS  . levETIRAcetam  1,000 mg Oral BID  . loxapine  25 mg Oral TID  . metoprolol tartrate  12.5 mg Oral BID  . OLANZapine  20 mg Oral QHS  . senna-docusate  2 tablet Oral BID  . sodium chloride flush  3 mL Intravenous Q12H  . sucralfate  1 g Oral TID WC & HS    Continuous Infusions:   LOS: 7 days     Hollice EspySendil K Arie Gable, MD [facility]  To reach me or the doctor on call, go to: [email or web address] Password [email or web address]  10/10/2019, 1:21 PM

## 2019-10-11 NOTE — Progress Notes (Signed)
PROGRESS NOTE  Jason Patterson KPT:[id number] DOB: 05/15/1956 DOA: 10/03/2019 PCP: [name], NP  HPI/Recap of past 16 hours: 63 year old male with past medical history of COPD, bipolar disorder, schizoaffective disorder, hypertension and seizure disorder presented to the emergency room on 11/15 with complaints of left-sided chest pain.  Troponin was elevated on admission and patient started on heparin.  Chest x-ray and EKG unrevealing.  Cardiology consulted but he was ruled out for non-STEMI and heparin discontinued.  No evidence of PE by CT.  Plan was for patient to get EGD by GI, but patient became agitated due to being n.p.o. and will drink water, EGD canceled.  Barium swallow done after unremarkable.  Patient ready for discharge, however his group home does not want to take him back.  APS looking for placement.  Patient is overdue for ECT.  Psychiatry consulted.  On afternoon of 11/20, patient told the social worker that he was tired of living and wanted to kill himself.  Apparently, the patient does have a previous history of doing this and has required inpatient placement in the past.  Psychiatry consulted who saw patient on 11/21 and felt that he was not a suicide risk.    Today, patient says he feels a little bit anxious, but better from yesterday.  No headache.  Assessment/Plan: Active Problems: Chest pain: Noncardiac, non-STEMI now ruled out.  Possibly esophagitis, since resolved.  2D echo unrevealing.  Elevated D-dimer, but CT angiogram negative for PE.  Noted circumferential esophageal wall thickening suggestive of esophagitis, but patient did not agree to being n.p.o. for EGD.  Barium swallow unrevealing.  Also possible, chest pain could be musculoskeletal given that it was reproducible with palpation and movement.  It appears to have resolved since.  Essential hypertension: Continue metoprolol, blood pressure stable.  Anemia of chronic disease: Patient's hemoglobin  dropped from 10.9 on admission down to 7.6.  However some of this may be more from hemoconcentration.  He was on a heparin drip for this 24 hours.  Received 1 unit packed red blood cells on 11/17.  Hemoglobin has since improved and remained stable.  Bipolar disorder/schizoaffective disorder: Patient now threatening suicide.  He was previously given ECT as an outpatient.  Psychiatry followed up and feels patient is not suicidal risk.  BPH: Continue Flomax.   Code Status: Full code  Family Communication: Left message for power of attorney on Friday  Disposition Plan: Looking at placement options  Consultants:  Psychiatry  Cardiology  Procedures:  Echocardiogram noting ejection fraction 55-60%.  No evidence of diastolic dysfunction.  Trace mitral valve regurgitation.  Otherwise no valvular dysfunction.  No evidence of diastolic dysfunction.  Status post 1 unit packed red blood cell transfusion 11/17  Antimicrobials:  None  DVT prophylaxis: Lovenox which patient refuses.  Ambulatory   Objective: Vitals:   10/11/19 0343 10/11/19 0718  BP: 116/81 121/74  Pulse: 75 74  Resp:  18  Temp: 98.4 F (36.9 C) 98.4 F (36.9 C)  SpO2: 100% 100%    Intake/Output Summary (Last 24 hours) at 10/11/2019 1351 Last data filed at 10/11/2019 0600 Gross per 24 hour  Intake 480 ml  Output 400 ml  Net 80 ml   Filed Weights   10/09/19 0508 10/10/19 0601 10/11/19 0343  Weight: 69.9 kg 67.8 kg 68.7 kg   Body mass index is 23.71 kg/m.  Exam: Unchanged from previous day  General: Alert and oriented x  HEENT: Normocephalic and atraumatic, mucous membranes are moist  Cardiovascular:  Regular rate and rhythm, S1-S2  Respiratory: Clear to auscultation bilaterally  Psychiatry: No evidence of acute psychosis, but flattened affect   Data Reviewed: CBC: Recent Labs  Lab 10/06/19 0611 10/07/19 0919 10/08/19 0718 10/09/19 0458 10/10/19 0827  WBC 4.4 5.3 6.2 6.7 6.3  HGB 8.9*  12.0* 10.8* 10.1* 12.0*  HCT 26.5* 35.0* 32.5* 29.0* 36.9*  MCV 92.3 92.1 95.0 89.5 94.6  PLT 178 153 240 248 300   Basic Metabolic Panel: Recent Labs  Lab 10/05/19 0450 10/06/19 0611 10/07/19 0919 10/08/19 0718 10/09/19 0458  NA 136 138 137 138 133*  K 3.4* 3.6 4.4 4.1 4.3  CL 107 111 104 104 104  CO2 21* 22 22 24  21*  GLUCOSE 99 90 74 96 95  BUN 12 11 13 13 13   CREATININE 0.85 0.76 0.88 0.81 0.80  CALCIUM 8.3* 8.4* 9.2 9.0 8.6*   GFR: Estimated Creatinine Clearance: 87.2 mL/min (by C-G formula based on SCr of 0.8 mg/dL). Liver Function Tests: No results for input(s): AST, ALT, ALKPHOS, BILITOT, PROT, ALBUMIN in the last 168 hours. No results for input(s): LIPASE, AMYLASE in the last 168 hours. No results for input(s): AMMONIA in the last 168 hours. Coagulation Profile: No results for input(s): INR, PROTIME in the last 168 hours. Cardiac Enzymes: No results for input(s): CKTOTAL, CKMB, CKMBINDEX, TROPONINI in the last 168 hours. BNP (last 3 results) No results for input(s): PROBNP in the last 8760 hours. HbA1C: No results for input(s): HGBA1C in the last 72 hours. CBG: No results for input(s): GLUCAP in the last 168 hours. Lipid Profile: No results for input(s): CHOL, HDL, LDLCALC, TRIG, CHOLHDL, LDLDIRECT in the last 72 hours. Thyroid Function Tests: No results for input(s): TSH, T4TOTAL, FREET4, T3FREE, THYROIDAB in the last 72 hours. Anemia Panel: No results for input(s): VITAMINB12, FOLATE, FERRITIN, TIBC, IRON, RETICCTPCT in the last 72 hours. Urine analysis:    Component Value Date/Time   COLORURINE YELLOW 08/27/2018 1700   APPEARANCEUR CLOUDY (A) 08/27/2018 1700   APPEARANCEUR Clear 04/28/2012 1940   LABSPEC 1.008 08/27/2018 1700   LABSPEC 1.006 04/28/2012 1940   PHURINE 6.0 08/27/2018 1700   GLUCOSEU 50 (A) 08/27/2018 1700   GLUCOSEU Negative 04/28/2012 1940   HGBUR LARGE (A) 08/27/2018 1700   BILIRUBINUR NEGATIVE 08/27/2018 1700   BILIRUBINUR  Negative 04/28/2012 1940   KETONESUR NEGATIVE 08/27/2018 1700   PROTEINUR NEGATIVE 08/27/2018 1700   UROBILINOGEN 0.2 05/31/2015 0220   NITRITE NEGATIVE 08/27/2018 1700   LEUKOCYTESUR NEGATIVE 08/27/2018 1700   LEUKOCYTESUR Negative 04/28/2012 1940   Sepsis Labs: @LABRCNTIP (procalcitonin:4,lacticidven:4)  ) Recent Results (from the past 240 hour(s))  SARS CORONAVIRUS 2 (TAT 6-24 HRS) Nasopharyngeal Nasopharyngeal Swab     Status: None   Collection Time: 10/03/19  1:48 AM   Specimen: Nasopharyngeal Swab  Result Value Ref Range Status   SARS Coronavirus 2 NEGATIVE NEGATIVE Final    Comment: (NOTE) SARS-CoV-2 target nucleic acids are NOT DETECTED. The SARS-CoV-2 RNA is generally detectable in upper and lower respiratory specimens during the acute phase of infection. Negative results do not preclude SARS-CoV-2 infection, do not rule out co-infections with other pathogens, and should not be used as the sole basis for treatment or other patient management decisions. Negative results must be combined with clinical observations, patient history, and epidemiological information. The expected result is Negative. Fact Sheet for Patients: HairSlick.nohttps://www.fda.gov/media/138098/download Fact Sheet for Healthcare Providers: quierodirigir.comhttps://www.fda.gov/media/138095/download This test is not yet approved or cleared by the [place] FDA and  has been  authorized for detection and/or diagnosis of SARS-CoV-2 by FDA under an Emergency Use Authorization (EUA). This EUA will remain  in effect (meaning this test can be used) for the duration of the COVID-19 declaration under Section 56 4(b)(1) of the Act, 21 U.S.C. section 360bbb-3(b)(1), unless the authorization is terminated or revoked sooner. Performed at [facility], [place] [address], [place], [place] [address]       Studies: No results found.  Scheduled Meds: . aspirin EC  81 mg Oral Daily  . atorvastatin  40 mg Oral q1800  .  cholecalciferol  2,000 Units Oral Daily  . famotidine  10 mg Oral BID  . latanoprost  1 drop Both Eyes QHS  . levETIRAcetam  1,000 mg Oral BID  . loxapine  25 mg Oral TID  . metoprolol tartrate  12.5 mg Oral BID  . OLANZapine  20 mg Oral QHS  . senna-docusate  2 tablet Oral BID  . sodium chloride flush  3 mL Intravenous Q12H  . sucralfate  1 g Oral TID WC & HS    Continuous Infusions:   LOS: 8 days     Annita Brod, MD [facility]  To reach me or the doctor on call, go to: [email or web address] Password [email or web address]  10/11/2019, 1:51 PM

## 2019-10-11 NOTE — NC FL2 (Signed)
[place] LEVEL OF CARE SCREENING TOOL     IDENTIFICATION  Patient Name: Jason Patterson Birthdate: 02/22/1956 Sex: male Admission Date (Current Location): 10/03/2019  [place] and [place] Number:  [name] [id number] [place] and Address:  [facility], [address], [place], [place] [address]      Provider Number: [id number]  Attending Physician Name and Address:  Annita Brod, MD  Relative Name and Phone Number:  Empowering,Lives Guardianship  ([place]  [phone] or  Guardian, [place]   [phone]       Current Level of Care: Hospital Recommended Level of Care: [facility] Prior Approval Number:    Date Approved/Denied:   PASRR Number:    Discharge Plan: Other (Comment)(Needs a family care home)    Current Diagnoses: Patient Active Problem List   Diagnosis Date Noted  . Elevated troponin 10/03/2019  . Respiratory distress 08/28/2018  . Hyperglycemia 08/28/2018  . Acute kidney injury superimposed on chronic kidney disease ([place]) 08/28/2018  . Elevated transaminase level 08/28/2018  . Seizure ([place]) 08/27/2018  . Gastritis 03/25/2015  . Seizures ([place]) 03/24/2015  . Coffee ground emesis   . Syncope 03/23/2015  . Fall 02/16/2015  . Traumatic pneumothorax 02/15/2015  . Rib fractures 02/15/2015  . Sepsis due to urinary tract infection ([place]) 02/03/2015  . Altered mental status 02/03/2015  . SIRS (systemic inflammatory response syndrome) ([place]) 02/03/2015  . Protein-calorie malnutrition, severe ([place]) 02/03/2015  . UTI (lower urinary tract infection)   . Ulnar neuropathy at elbow of left upper extremity 06/23/2014  . Chest pain 05/01/2014  . Schizoaffective disorder, bipolar type ([place]) 04/26/2014  . Generalized anxiety disorder 05/01/2009  . Essential hypertension 05/01/2009  . RIGHT BUNDLE BRANCH BLOCK 05/01/2009  . COPD (chronic obstructive pulmonary  disease) ([place]) 05/01/2009  . GERD 05/01/2009  . PSORIASIS 05/01/2009  . Convulsions ([place]) 05/01/2009  . CHEST PAIN UNSPECIFIED 05/01/2009    Orientation RESPIRATION BLADDER Height & Weight     Self, Time, Situation, Place  Normal Continent Weight: 151 lb 6.4 oz (68.7 kg) Height:  5\' 7"  (170.2 cm)  BEHAVIORAL SYMPTOMS/MOOD NEUROLOGICAL BOWEL NUTRITION STATUS  Other (Comment)(Patient has some paranoia)   Continent Diet(Regular diet)  AMBULATORY STATUS COMMUNICATION OF NEEDS Skin   Independent Verbally Normal                       Personal Care Assistance Level of Assistance  Bathing, Dressing, Feeding Bathing Assistance: Independent Feeding assistance: Independent Dressing Assistance: Independent     Functional Limitations Info  Sight, Hearing, Speech Sight Info: Adequate Hearing Info: Adequate Speech Info: Adequate    SPECIAL CARE FACTORS FREQUENCY                       Contractures      Additional Factors Info  Code Status, Allergies, Psychotropic Code Status Info: Full Code Allergies Info: Paxil, Penicillins Tramadol Depakote, Acyclovir And Related Psychotropic Info: OLANZapine (ZYPREXA) tablet 20 mg         Current Medications (10/11/2019):  This is the current hospital active medication list Current Facility-Administered Medications  Medication Dose Route Frequency Provider Last Rate Last Dose  . acetaminophen (TYLENOL) tablet 650 mg  650 mg Oral Q4H PRN Mansy, Jan A, MD   650 mg at 10/11/19 1407  . albuterol (PROVENTIL) (2.5 MG/3ML) 0.083% nebulizer solution 3 mL  3 mL Inhalation Q6H PRN Mansy, Arvella Merles, MD      .  ALPRAZolam Prudy Feeler) tablet 0.25 mg  0.25 mg Oral TID PRN Clement Sayres, MD   0.25 mg at 10/11/19 6440  . aspirin EC tablet 81 mg  81 mg Oral Daily Mansy, Jan A, MD   81 mg at 10/11/19 0906  . atorvastatin (LIPITOR) tablet 40 mg  40 mg Oral q1800 Mansy, Jan A, MD   40 mg at 10/10/19 1700  . cholecalciferol (VITAMIN D) tablet 2,000 Units   2,000 Units Oral Daily Mansy, Vernetta Honey, MD   2,000 Units at 10/11/19 0906  . famotidine (PEPCID) tablet 10 mg  10 mg Oral BID Mansy, Jan A, MD   10 mg at 10/11/19 0906  . latanoprost (XALATAN) 0.005 % ophthalmic solution 1 drop  1 drop Both Eyes QHS Mansy, Jan A, MD   1 drop at 10/08/19 2006  . levETIRAcetam (KEPPRA) tablet 1,000 mg  1,000 mg Oral BID Mansy, Jan A, MD   1,000 mg at 10/11/19 0906  . loxapine (LOXITANE) capsule 25 mg  25 mg Oral TID Mansy, Jan A, MD   25 mg at 10/11/19 3474  . metoprolol tartrate (LOPRESSOR) tablet 12.5 mg  12.5 mg Oral BID Leatha Gilding, MD   12.5 mg at 10/11/19 0906  . nitroGLYCERIN (NITROSTAT) SL tablet 0.4 mg  0.4 mg Sublingual Q5 Min x 3 PRN Mansy, Jan A, MD   0.4 mg at 10/03/19 0936  . OLANZapine (ZYPREXA) tablet 20 mg  20 mg Oral QHS Mansy, Jan A, MD   20 mg at 10/10/19 2046  . ondansetron (ZOFRAN) injection 4 mg  4 mg Intravenous Q6H PRN Mansy, Jan A, MD      . senna-docusate (Senokot-S) tablet 2 tablet  2 tablet Oral BID Leatha Gilding, MD   2 tablet at 10/11/19 0906  . sodium chloride flush (NS) 0.9 % injection 3 mL  3 mL Intravenous Q12H Hollice Espy, MD   3 mL at 10/11/19 0908  . sucralfate (CARAFATE) 1 GM/10ML suspension 1 g  1 g Oral TID WC & HS Leatha Gilding, MD   1 g at 10/11/19 1408  . zolpidem (AMBIEN) tablet 5 mg  5 mg Oral QHS PRN,MR X 1 Mansy, Jan A, MD   5 mg at 10/08/19 2007     Discharge Medications: Please see discharge summary for a list of discharge medications.  Relevant Imaging Results:  Relevant Lab Results:   Additional Information SSN [id number]  Darleene Cleaver, LCSW

## 2019-10-11 NOTE — TOC Progression Note (Signed)
Transition of Care [facility]) - Progression Note    Patient Details  Name: Jason Patterson MRN: [id number] Date of Birth: [birth year]  [facility]) CM/SW Contact  Ross Ludwig, Ross Phone Number: 10/11/2019, 5:34 PM  Clinical Narrative:     CSW spoke with legal guardian Freda Munro, she is still working on trying to find new placement for patient  CSW contacted her supervisor [name] who is on call today, they both requested CSW fax clinicals and FL2 to them.  CSW faxed clinicals to [phone], currently no other options for patient yet.  Patient continues to walk around the hall, but has remained relatively calm.  CSW to continue to follow patient's progress throughout discharge planning.   Expected Discharge Plan: Group Home Barriers to Discharge: Continued Medical Work up  Expected Discharge Plan and Services Expected Discharge Plan: Group Home In-house Referral: Clinical Social Work Discharge Planning Services: NA   Living arrangements for the past 2 months: Group Home                           HH Arranged: NA [place] Agency: NA         Social Determinants of Health (SDOH) Interventions    Readmission Risk Interventions No flowsheet data found.

## 2019-10-11 NOTE — Plan of Care (Signed)
  Problem: Activity: Goal: Risk for activity intolerance will decrease Outcome: Progressing   

## 2019-10-12 MED ORDER — ALPRAZOLAM 0.25 MG PO TABS
0.2500 mg | ORAL_TABLET | ORAL | Status: DC | PRN
  Administered 2019-10-12 – 2019-10-16 (×8): 0.25 mg via ORAL
  Filled 2019-10-12 (×8): qty 1

## 2019-10-12 NOTE — Progress Notes (Signed)
PROGRESS NOTE  Jason Patterson ZOX:[id number] DOB: 1955/11/27 DOA: 10/03/2019 PCP: Koren BoundMatrone, Andrew, NP  HPI/Recap of past 824 hours: 63 year old male with past medical history of COPD, bipolar disorder, schizoaffective disorder, hypertension and seizure disorder presented to the emergency room on 11/15 with complaints of left-sided chest pain.  Troponin was elevated on admission and patient started on heparin.  Chest x-ray and EKG unrevealing.  Cardiology consulted but he was ruled out for non-STEMI and heparin discontinued.  No evidence of PE by CT.  Plan was for patient to get EGD by GI, but patient became agitated due to being n.p.o. and will drink water, EGD canceled.  Barium swallow done after unremarkable.  Patient ready for discharge, however his group home does not want to take him back.  APS looking for placement.  Patient is overdue for ECT.  Psychiatry consulted.  On afternoon of 11/20, patient told the social worker that he was tired of living and wanted to kill himself.  Apparently, the patient does have a previous history of doing this and has required inpatient placement in the past.  Psychiatry consulted who saw patient on 11/21 and felt that he was not a suicide risk.    Patient having a bit more anxiety than usual.  Denies wanting to hurt himself though.  Assessment/Plan: Active Problems: Chest pain: Noncardiac, non-STEMI now ruled out.  Possibly esophagitis, since resolved.  2D echo unrevealing.  Elevated D-dimer, but CT angiogram negative for PE.  Noted circumferential esophageal wall thickening suggestive of esophagitis, but patient did not agree to being n.p.o. for EGD.  Barium swallow unrevealing.  Also possible, chest pain could be musculoskeletal given that it was reproducible with palpation and movement.  It appears to have resolved since.  Essential hypertension: Continue metoprolol, blood pressure stable.  Anemia of chronic disease: Patient's hemoglobin dropped  from 10.9 on admission down to 7.6.  However some of this may be more from hemoconcentration.  He was on a heparin drip for this 24 hours.  Received 1 unit packed red blood cells on 11/17.  Hemoglobin has since improved and remained stable.  Bipolar disorder/schizoaffective disorder/anxiety disorder: Patient now threatening suicide.  He was previously given ECT as an outpatient.  Psychiatry followed up and feels patient is not suicidal risk.  Will increase low-dose Xanax frequency  BPH: Continue Flomax.   Code Status: Full code  Family Communication: Left message for power of attorney on Friday  Disposition Plan: Social work looking at different placement options  Consultants:  Psychiatry  Cardiology  Procedures:  Echocardiogram noting ejection fraction 55-60%.  No evidence of diastolic dysfunction.  Trace mitral valve regurgitation.  Otherwise no valvular dysfunction.  No evidence of diastolic dysfunction.  Status post 1 unit packed red blood cell transfusion 11/17  Antimicrobials:  None  DVT prophylaxis: Lovenox which patient refuses.  Ambulatory   Objective: Vitals:   10/12/19 0443 10/12/19 0759  BP: 113/73 102/74  Pulse: 68 68  Resp: 20 17  Temp: 98.6 F (37 C) 98.2 F (36.8 C)  SpO2: 100% 100%    Intake/Output Summary (Last 24 hours) at 10/12/2019 1532 Last data filed at 10/12/2019 1030 Gross per 24 hour  Intake 240 ml  Output 500 ml  Net -260 ml   Filed Weights   10/09/19 0508 10/10/19 0601 10/11/19 0343  Weight: 69.9 kg 67.8 kg 68.7 kg   Body mass index is 23.71 kg/m.  Exam:   General: Alert and oriented x 2, mild distress from anxiety  HEENT: Normocephalic and atraumatic, mucous membranes are moist  Cardiovascular: Regular rate and rhythm, S1-S2  Respiratory: Clear to auscultation bilaterally  Psychiatry: No evidence of acute psychosis, but flattened affect   Data Reviewed: CBC: Recent Labs  Lab 10/06/19 0611 10/07/19 0919 10/08/19  0718 10/09/19 0458 10/10/19 0827  WBC 4.4 5.3 6.2 6.7 6.3  HGB 8.9* 12.0* 10.8* 10.1* 12.0*  HCT 26.5* 35.0* 32.5* 29.0* 36.9*  MCV 92.3 92.1 95.0 89.5 94.6  PLT 178 153 240 248 517   Basic Metabolic Panel: Recent Labs  Lab 10/06/19 0611 10/07/19 0919 10/08/19 0718 10/09/19 0458  NA 138 137 138 133*  K 3.6 4.4 4.1 4.3  CL 111 104 104 104  CO2 22 22 24  21*  GLUCOSE 90 74 96 95  BUN 11 13 13 13   CREATININE 0.76 0.88 0.81 0.80  CALCIUM 8.4* 9.2 9.0 8.6*   GFR: Estimated Creatinine Clearance: 87.2 mL/min (by C-G formula based on SCr of 0.8 mg/dL). Liver Function Tests: No results for input(s): AST, ALT, ALKPHOS, BILITOT, PROT, ALBUMIN in the last 168 hours. No results for input(s): LIPASE, AMYLASE in the last 168 hours. No results for input(s): AMMONIA in the last 168 hours. Coagulation Profile: No results for input(s): INR, PROTIME in the last 168 hours. Cardiac Enzymes: No results for input(s): CKTOTAL, CKMB, CKMBINDEX, TROPONINI in the last 168 hours. BNP (last 3 results) No results for input(s): PROBNP in the last 8760 hours. HbA1C: No results for input(s): HGBA1C in the last 72 hours. CBG: No results for input(s): GLUCAP in the last 168 hours. Lipid Profile: No results for input(s): CHOL, HDL, LDLCALC, TRIG, CHOLHDL, LDLDIRECT in the last 72 hours. Thyroid Function Tests: No results for input(s): TSH, T4TOTAL, FREET4, T3FREE, THYROIDAB in the last 72 hours. Anemia Panel: No results for input(s): VITAMINB12, FOLATE, FERRITIN, TIBC, IRON, RETICCTPCT in the last 72 hours. Urine analysis:    Component Value Date/Time   COLORURINE YELLOW 08/27/2018 1700   APPEARANCEUR CLOUDY (A) 08/27/2018 1700   APPEARANCEUR Clear 04/28/2012 1940   LABSPEC 1.008 08/27/2018 1700   LABSPEC 1.006 04/28/2012 1940   PHURINE 6.0 08/27/2018 1700   GLUCOSEU 50 (A) 08/27/2018 1700   GLUCOSEU Negative 04/28/2012 1940   HGBUR LARGE (A) 08/27/2018 1700   BILIRUBINUR NEGATIVE 08/27/2018  1700   BILIRUBINUR Negative 04/28/2012 1940   KETONESUR NEGATIVE 08/27/2018 1700   PROTEINUR NEGATIVE 08/27/2018 1700   UROBILINOGEN 0.2 05/31/2015 0220   NITRITE NEGATIVE 08/27/2018 1700   LEUKOCYTESUR NEGATIVE 08/27/2018 1700   LEUKOCYTESUR Negative 04/28/2012 1940   Sepsis Labs: @LABRCNTIP (procalcitonin:4,lacticidven:4)  ) Recent Results (from the past 240 hour(s))  SARS CORONAVIRUS 2 (TAT 6-24 HRS) Nasopharyngeal Nasopharyngeal Swab     Status: None   Collection Time: 10/03/19  1:48 AM   Specimen: Nasopharyngeal Swab  Result Value Ref Range Status   SARS Coronavirus 2 NEGATIVE NEGATIVE Final    Comment: (NOTE) SARS-CoV-2 target nucleic acids are NOT DETECTED. The SARS-CoV-2 RNA is generally detectable in upper and lower respiratory specimens during the acute phase of infection. Negative results do not preclude SARS-CoV-2 infection, do not rule out co-infections with other pathogens, and should not be used as the sole basis for treatment or other patient management decisions. Negative results must be combined with clinical observations, patient history, and epidemiological information. The expected result is Negative. Fact Sheet for Patients: SugarRoll.be Fact Sheet for Healthcare Providers: https://www.woods-mathews.com/ This test is not yet approved or cleared by the [place] FDA and  has been  authorized for detection and/or diagnosis of SARS-CoV-2 by FDA under an Emergency Use Authorization (EUA). This EUA will remain  in effect (meaning this test can be used) for the duration of the COVID-19 declaration under Section 56 4(b)(1) of the Act, 21 U.S.C. section 360bbb-3(b)(1), unless the authorization is terminated or revoked sooner. Performed at [facility], [address] [place], [place] [address]       Studies: No results found.  Scheduled Meds: . aspirin EC  81 mg Oral Daily  . atorvastatin  40 mg Oral  q1800  . cholecalciferol  2,000 Units Oral Daily  . famotidine  10 mg Oral BID  . latanoprost  1 drop Both Eyes QHS  . levETIRAcetam  1,000 mg Oral BID  . loxapine  25 mg Oral TID  . metoprolol tartrate  12.5 mg Oral BID  . OLANZapine  20 mg Oral QHS  . senna-docusate  2 tablet Oral BID  . sodium chloride flush  3 mL Intravenous Q12H  . sucralfate  1 g Oral TID WC & HS    Continuous Infusions:   LOS: 9 days     Hollice Espy, MD [facility]  To reach me or the doctor on call, go to: [email or web address] Password [facility]  10/12/2019, 3:32 PM

## 2019-10-12 NOTE — Progress Notes (Signed)
Nutrition Brief Note  Patient identified on the Malnutrition Screening Tool (MST) Report  63 year old male with past medical history of COPD, bipolar disorder, schizoaffective disorder, hypertension and seizure disorder presented to the emergency room on 11/15 with complaints of left-sided chest pain.   Wt Readings from Last 15 Encounters:  10/11/19 68.7 kg  07/26/19 70.3 kg  11/28/18 72.6 kg  11/27/18 72.6 kg  10/06/18 78 kg  08/27/18 78 kg  03/18/16 72.6 kg  03/18/16 74.8 kg  02/23/16 74.8 kg  01/19/16 77.6 kg  01/13/16 77.6 kg  01/07/16 81.2 kg  11/15/15 93.4 kg  05/31/15 67.1 kg  04/06/15 67.1 kg    Body mass index is 23.71 kg/m. Patient meets criteria for normal weight based on current BMI.   Current diet order is regular, patient is consuming approximately 100% of meals at this time. Labs and medications reviewed.   No nutrition interventions warranted at this time. If nutrition issues arise, please consult RD.   Koleen Distance MS, RD, LDN Pager #- [phone] Office#- [phone] After Hours Pager: [phone]

## 2019-10-13 DIAGNOSIS — R569 Unspecified convulsions: Secondary | ICD-10-CM

## 2019-10-13 DIAGNOSIS — J449 Chronic obstructive pulmonary disease, unspecified: Secondary | ICD-10-CM

## 2019-10-13 NOTE — Progress Notes (Signed)
[place] at [place] NAME: Jason Patterson    MR#:  [id number]  DATE OF BIRTH:  04/01/1956  SUBJECTIVE:   Patient is asking me for him to have several cans of soda during the daytime. He is a bit upset and wants to leave the hospital. No new complaints per RN refuses to wear monitor REVIEW OF SYSTEMS:   Review of Systems  Unable to perform ROS: Psychiatric disorder   Tolerating Diet:yes Tolerating PT: ambulates in the hallways  DRUG ALLERGIES:   Allergies  Allergen Reactions  . Paxil [Paroxetine Hcl] Other (See Comments)    Told by MD to discontinue use  . Penicillins Other (See Comments)    Family history of allergies; patient's sister took once and died as a result (FYI).  Amoxicillin is ok  . Tramadol Other (See Comments)    Seizure disorder.  Caused seizure.  . Depakote [Divalproex Sodium] Hives and Swelling  . Acyclovir And Related     VITALS:  Blood pressure 124/75, pulse 65, temperature 98.4 F (36.9 C), temperature source Oral, resp. rate 18, height 5\' 7"  (1.702 m), weight 69.6 kg, SpO2 100 %.  PHYSICAL EXAMINATION:   Physical Exam  Jason:  63 y.o.-year-old patient lying in the bed with no acute distress.  EYES: Pupils equal, round, reactive to light and accommodation. No scleral icterus. Extraocular muscles intact.  HEENT: Head atraumatic, normocephalic. Oropharynx and nasopharynx clear.  NECK:  Supple, no jugular venous distention. No thyroid enlargement, no tenderness.  LUNGS: Normal breath sounds bilaterally, no wheezing, rales, rhonchi. No use of accessory muscles of respiration.  CARDIOVASCULAR: S1, S2 normal. No murmurs, rubs, or gallops.  ABDOMEN: Soft, nontender, nondistended. Bowel sounds present. No organomegaly or mass.  EXTREMITIES: No cyanosis, clubbing or edema b/l.    NEUROLOGIC: Neuro exam grossly nonfocal No focal Motor or sensory deficits b/l.   PSYCHIATRIC:  patient is alert .  SKIN: No  obvious rash, lesion, or ulcer.   LABORATORY PANEL:  CBC Recent Labs  Lab 10/10/19 0827  WBC 6.3  HGB 12.0*  HCT 36.9*  PLT 300    Chemistries  Recent Labs  Lab 10/09/19 0458  NA 133*  K 4.3  CL 104  CO2 21*  GLUCOSE 95  BUN 13  CREATININE 0.80  CALCIUM 8.6*   Cardiac Enzymes No results for input(s): TROPONINI in the last 168 hours. RADIOLOGY:  No results found. ASSESSMENT AND PLAN:  63 year old male with past medical history of COPD, bipolar disorder, schizoaffective disorder, hypertension and seizure disorder presented to the emergency room on 11/15 with complaints of left-sided chest pain  * Chest pain/Atypical/Noncardiac, non-STEMI now ruled out.  - Possibly esophagitis--now resolved  -2D echo unrevealing.  - Elevated D-dimer, but CT angiogram negative for PE.  Noted circumferential esophageal wall thickening suggestive of esophagitis, but patient did not agree to being n.p.o. for EGD.  Barium swallow unrevealing.   *Essential hypertension: Continue metoprolol, blood pressure stable.  *Anemia of chronic disease: Patient's hemoglobin dropped from 10.9 on admission down to 7.6.  However some of this may be more from hemoconcentration.  He was on a heparin drip for this 24 hours.  Received 1 unit packed red blood cells on 11/17.  Hemoglobin has since improved and remained stable.  *Bipolar disorder/schizoaffective disorder/anxiety disorder - Patient was threatening suicide.  He was previously given ECT as an outpatient. - Psychiatry followed up and feels patient is not suicidal risk. Psych has signed off  and they don't feel there is any indication fro pt to go to [facility] -Will increase low-dose Xanax frequency  *BPH: Continue Flomax.  Code Status: Full code Family Communication: CSW updated power of attorney for d/c plans Disposition Plan: Social work looking at different placement options DVT Prophylaxis :ambulation  TOTAL TIME TAKING CARE OF THIS PATIENT:  *25* minutes.  >50% time spent on counselling and coordination of care  POSSIBLE D/C IN *?* DAYS, DEPENDING ON CLINICAL CONDITION.  Note: This dictation was prepared with Dragon dictation along with smaller phrase technology. Any transcriptional errors that result from this process are unintentional.  Enedina Finner M.D on 10/13/2019 at 4:03 PM  Between 7am to 6pm - Pager - [phone]  After 6pm go to [email or web address]  [facility]   CC: Primary care physician; [name], NPPatient ID: Jason Patterson, male   DOB: 1956/06/12, 63 y.o.   MRN: [id number]

## 2019-10-13 NOTE — TOC Progression Note (Signed)
Transition of Care [facility]) - Progression Note    Patient Details  Name: Jason Patterson MRN: [id number] Date of Birth: 08-19-1956  Transition of [facility]) CM/SW Contact  Ross Ludwig, [place] Phone Number: 10/13/2019, 10:33 AM  Clinical Narrative:     CSW spoke with Freda Munro patient's legal guardian [phone], to see if there were any updates on placement.  CSW was informed that there are three possibilities of placement, however one of them said that patient will need his ECT treatments closer to where the group home is located at.  Legal guardian, stated that she tried contacting patient's current ECT treatment team Dr. Weber Cooks and the ECT coordinator to work on trying to switch ECT treatment facilities, however they are out of town this week.  Legal guardian stated she will probably not hear anything from the group homes till early next week.  CSW discussed if APS has been involved and she said her supervisor has already spoken to them.  Patient's legal guardian has also contacted ACT team and [employer] to try to get higher funding for patient due to his increased level of care due to his Borderline Personality Disorder and other diagnoses.  CSW updated Lead Karna Christmas Craft who was going to call Burnetta Sabin at APS who is following up on the case as well.     Expected Discharge Plan: Group Home Barriers to Discharge: Continued Medical Work up  Expected Discharge Plan and Services Expected Discharge Plan: Group Home In-house Referral: Clinical Social Work Discharge Planning Services: NA   Living arrangements for the past 2 months: Group Home                           HH Arranged: NA [place] Agency: NA         Social Determinants of Health (SDOH) Interventions    Readmission Risk Interventions No flowsheet data found.

## 2019-10-14 MED ORDER — LORAZEPAM 1 MG PO TABS
1.0000 mg | ORAL_TABLET | Freq: Once | ORAL | Status: AC
  Administered 2019-10-14: 1 mg via ORAL
  Filled 2019-10-14: qty 1

## 2019-10-14 NOTE — Progress Notes (Signed)
[facility]  - [place] at [facility]   PATIENT NAME: Jason Patterson    MR#:  [id number]  DATE OF BIRTH:  [birth year]  SUBJECTIVE:   Patient is asking me for him to have several cans of soda during the daytime.  No new complaints per RN refuses to wear monitor. Patient complains of nausea every now and then and keeps asking for his Zofran. No vomiting. He tolerated his diet well. Ambulates around the nurses station. REVIEW OF SYSTEMS:   Review of Systems  Unable to perform ROS: Psychiatric disorder   Tolerating Diet:yes Tolerating PT: ambulates in the hallways  DRUG ALLERGIES:   Allergies  Allergen Reactions  . Paxil [Paroxetine Hcl] Other (See Comments)    Told by MD to discontinue use  . Penicillins Other (See Comments)    Family history of allergies; patient's sister took once and died as a result (FYI).  Amoxicillin is ok  . Tramadol Other (See Comments)    Seizure disorder.  Caused seizure.  . Depakote [Divalproex Sodium] Hives and Swelling  . Acyclovir And Related     VITALS:  Blood pressure 117/73, pulse 79, temperature 98.1 F (36.7 C), temperature source Oral, resp. rate 20, height 5\' 7"  (1.702 m), weight 70 kg, SpO2 100 %.  PHYSICAL EXAMINATION:   Physical Exam  GENERAL:  63 y.o.-year-old patient lying in the bed with no acute distress.  EYES: Pupils equal, round, reactive to light and accommodation. No scleral icterus. Extraocular muscles intact.  HEENT: Head atraumatic, normocephalic. Oropharynx and nasopharynx clear.  NECK:  Supple, no jugular venous distention. No thyroid enlargement, no tenderness.  LUNGS: Normal breath sounds bilaterally, no wheezing, rales, rhonchi. No use of accessory muscles of respiration.  CARDIOVASCULAR: S1, S2 normal. No murmurs, rubs, or gallops.  ABDOMEN: Soft, nontender, nondistended. Bowel sounds present. No organomegaly or mass.  EXTREMITIES: No cyanosis, clubbing or edema b/l.    NEUROLOGIC: Neuro exam  grossly nonfocal No focal Motor or sensory deficits b/l.   PSYCHIATRIC:  patient is alert .  SKIN: No obvious rash, lesion, or ulcer.   LABORATORY PANEL:  CBC Recent Labs  Lab 10/10/19 0827  WBC 6.3  HGB 12.0*  HCT 36.9*  PLT 300    Chemistries  Recent Labs  Lab 10/09/19 0458  NA 133*  K 4.3  CL 104  CO2 21*  GLUCOSE 95  BUN 13  CREATININE 0.80  CALCIUM 8.6*   Cardiac Enzymes No results for input(s): TROPONINI in the last 168 hours. RADIOLOGY:  No results found. ASSESSMENT AND PLAN:  63 year old male with past medical history of COPD, bipolar disorder, schizoaffective disorder, hypertension and seizure disorder presented to the emergency room on 11/15 with complaints of left-sided chest pain  * Chest pain/Atypical/Noncardiac, non-STEMI now ruled out.  - Possibly esophagitis--now resolved  -2D echo unrevealing.  - Elevated D-dimer, but CT angiogram negative for PE.  Noted circumferential esophageal wall thickening suggestive of esophagitis, but patient did not agree to being n.p.o. for EGD.  Barium swallow unrevealing.   *Essential hypertension: Continue metoprolol, blood pressure stable.  *Anemia of chronic disease: Patient's hemoglobin dropped from 10.9 on admission down to 7.6.  However some of this may be more from hemoconcentration.  He was on a heparin drip for this 24 hours.  Received 1 unit packed red blood cells on 11/17.  Hemoglobin has since improved and remained stable.  *Bipolar disorder/schizoaffective disorder/anxiety disorder - Patient was threatening suicide.  He was previously given ECT as an  outpatient. - Psychiatry followed up and feels patient is not suicidal risk. Psych has signed off and they don't feel there is any indication fro pt to go to [place] -Will increase low-dose Xanax frequency  *BPH: Continue Flomax.  Code Status: Full code Family Communication: CSW updated power of attorney for d/c plans Disposition Plan: Social work looking  at different placement options DVT Prophylaxis :ambulation  TOTAL TIME TAKING CARE OF THIS PATIENT: *20* minutes.  >50% time spent on counselling and coordination of care  POSSIBLE D/C IN *?* DAYS, DEPENDING ON CLINICAL CONDITION-- awaiting placement.  Note: This dictation was prepared with Dragon dictation along with smaller phrase technology. Any transcriptional errors that result from this process are unintentional.  Fritzi Mandes M.D on 10/14/2019 at 12:10 PM  Between 7am to 6pm - Pager - [phone]  After 6pm go to [email or web address]  [facility]   CC: Primary care physician; Terrill Mohr, NPPatient ID: Jason Patterson, male   DOB: Dec 12, 1955, 63 y.o.   MRN: [id number]

## 2019-10-14 NOTE — Plan of Care (Signed)
  Problem: Education: Goal: Knowledge of General Education information will improve Description Including pain rating scale, medication(s)/side effects and non-pharmacologic comfort measures Outcome: Progressing   

## 2019-10-14 NOTE — Progress Notes (Signed)
Throughout shift, pt ruminating about dying today.  Pt c/o headache; tylenol given at 0855 .  Pt c/o nausea; zofran given at 0947.  Pt c/o anxiety; medicated with xanax at 1004.  Pt continued to make paranoid and doomsday comments.  Dr. Posey Pronto informed, and ativan 1mg  PO x1 dose ordered; given at 1404. Relaxation and guided imagery attempted.  Pt rested quiety x90 minutes.

## 2019-10-15 DIAGNOSIS — F250 Schizoaffective disorder, bipolar type: Secondary | ICD-10-CM | POA: Diagnosis not present

## 2019-10-15 NOTE — Progress Notes (Addendum)
[place] at [place] NAME: Jason Patterson    MR#:  [id number]  DATE OF BIRTH:  09-22-1956  SUBJECTIVE:   No new complaints per RN. Patient complains of nausea every now and then and asking for his Zofran. No vomiting. He tolerated his diet well. Ambulates around the nurses station. Calm REVIEW OF SYSTEMS:   Review of Systems  Unable to perform ROS: Psychiatric disorder   Tolerating Diet:yes Tolerating PT: ambulates in the hallways  DRUG ALLERGIES:   Allergies  Allergen Reactions  . Paxil [Paroxetine Hcl] Other (See Comments)    Told by MD to discontinue use  . Penicillins Other (See Comments)    Family history of allergies; patient's sister took once and died as a result (FYI).  Amoxicillin is ok  . Tramadol Other (See Comments)    Seizure disorder.  Caused seizure.  . Depakote [Divalproex Sodium] Hives and Swelling  . Acyclovir And Related     VITALS:  Blood pressure (!) 89/67, pulse 68, temperature 98.3 F (36.8 C), temperature source Oral, resp. rate 20, height 5\' 7"  (1.702 m), weight 71.9 kg, SpO2 97 %.  PHYSICAL EXAMINATION:   Physical Exam  GENERAL:  63 y.o.-year-old patient lying in the bed with no acute distress.  EYES: Pupils equal, round, reactive to light and accommodation. No scleral icterus. Extraocular muscles intact.  HEENT: Head atraumatic, normocephalic. Oropharynx and nasopharynx clear.  NECK:  Supple, no jugular venous distention. No thyroid enlargement, no tenderness.  LUNGS: Normal breath sounds bilaterally, no wheezing, rales, rhonchi. No use of accessory muscles of respiration.  CARDIOVASCULAR: S1, S2 normal. No murmurs, rubs, or gallops.  ABDOMEN: Soft, nontender, nondistended. Bowel sounds present. No organomegaly or mass.  EXTREMITIES: No cyanosis, clubbing or edema b/l.    NEUROLOGIC: Neuro exam grossly nonfocal No focal Motor or sensory deficits b/l.   PSYCHIATRIC:  patient is alert .  SKIN: No  obvious rash, lesion, or ulcer.   LABORATORY PANEL:  CBC Recent Labs  Lab 10/10/19 0827  WBC 6.3  HGB 12.0*  HCT 36.9*  PLT 300    Chemistries  Recent Labs  Lab 10/09/19 0458  NA 133*  K 4.3  CL 104  CO2 21*  GLUCOSE 95  BUN 13  CREATININE 0.80  CALCIUM 8.6*   Cardiac Enzymes No results for input(s): TROPONINI in the last 168 hours. RADIOLOGY:  No results found. ASSESSMENT AND PLAN:  63 year old male with past medical history of COPD, bipolar disorder, schizoaffective disorder, hypertension and seizure disorder presented to the emergency room on 11/15 with complaints of left-sided chest pain  * Chest pain/Atypical/Noncardiac, non-STEMI now ruled out.  - Possibly esophagitis--now resolved  -2D echo unrevealing.  - CT angiogram negative for PE.  Noted circumferential esophageal wall thickening suggestive of esophagitis -  Barium swallow unrevealing.   *Essential hypertension: Continue metoprolol, blood pressure stable.  *Anemia of chronic disease: Patient's hemoglobin dropped from 10.9 on admission down to 7.6.  However some of this may be more from hemoconcentration.  He was on a heparin drip for this 24 hours.  Received 1 unit packed red blood cells on 11/17.  Hemoglobin has since improved and remained stable.  *Bipolar disorder/schizoaffective disorder/anxiety disorder - Patient was threatening suicide.  He was previously given ECT as an outpatient. - Psychiatry followed up and feels patient is not suicidal risk. - Psych has signed off and they don't feel there is any indication for pt to go to [facility] -  on Loxitane and Zyprexa -prn xanax  *BPH: Continue Flomax.  Code Status: Full code Family Communication: CSW updated power of attorney for d/c plans Disposition Plan: Social work looking at different placement options DVT Prophylaxis :ambulation  TOTAL TIME TAKING CARE OF THIS PATIENT: *20* minutes.  >50% time spent on counselling and coordination of  care  POSSIBLE D/C IN *?* DAYS, DEPENDING ON CLINICAL CONDITION-- awaiting placement.  Note: This dictation was prepared with Dragon dictation along with smaller phrase technology. Any transcriptional errors that result from this process are unintentional.  Enedina Finner M.D on 10/15/2019 at 9:48 AM  Between 7am to 6pm - Pager - [phone]  After 6pm go to [email or web address]  [facility]   CC: Primary care physician; [name], NPPatient ID: Jason Patterson, male   DOB: Jan 25, 1956, 63 y.o.   MRN: [id number]

## 2019-10-15 NOTE — Progress Notes (Signed)
   10/15/19 1400  Clinical Encounter Type  Visited With Patient  Visit Type Initial;Spiritual support  Referral From Nurse  Consult/Referral To Chaplain  Spiritual Encounters  Spiritual Needs Emotional  Stress Factors  Patient Stress Factors Loss  Chaplain received a page for patient being lonely and needed someone to talk to. Upon arrival patient was at the nurse's desk and talking to CNA. Chaplain introduced herself and asked patient if he would like to visit in his room. Patient said yes and we walked to room. Patient began to talk about his care at the hospital. Patient felt as if he was being ignored by those on the floor and that the care team did not like him. Chaplain assured patient that was not the case and that she was a part of his care team as a Chaplain and I was sorry that he feels this way and shared that this behavior has not been her experience with any of our staff. Chaplain apologize on behalf or care team and explain to him that the care team has several patient to take care of and this is the reason they call the Chaplain. Chaplain explained to patient that as a Chaplain we are a little flexible and had more time to sit and talk with him. Patient understood at that moment, however, Chaplain is not sure how long that understanding would remain.Chaplain sat for a while and during that time, patient went to bathroom 4 times. Chaplain let nurse know and she explain that patient ate ice cream and had milk and that was probably his problem. Chaplain went back in to continue visit before another page and patient ask for medicine for diarrhea. Chaplain went to nurse's station and nurse was not there. Chaplain relayed message to clerk sec. Chaplain will follow up later.
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
## 2019-10-15 NOTE — Progress Notes (Signed)
Pt pacing up and down hallway. HR 125, BP 167/142, spO2 98%. Patel notified.

## 2019-10-15 NOTE — Progress Notes (Addendum)
Spoke with jeanette on 2C and given report. Pt was transsferred to [place]. States was pt legal guardian now but unable to get number to notify them about the transfer because it cannot be found on the chart. Notify RN [employer].

## 2019-10-16 MED ORDER — LORAZEPAM 0.5 MG PO TABS
0.5000 mg | ORAL_TABLET | Freq: Three times a day (TID) | ORAL | Status: DC | PRN
  Administered 2019-10-16 – 2019-10-17 (×2): 0.5 mg via ORAL
  Filled 2019-10-16 (×3): qty 1

## 2019-10-16 MED ORDER — DIPHENOXYLATE-ATROPINE 2.5-0.025 MG PO TABS
1.0000 | ORAL_TABLET | Freq: Three times a day (TID) | ORAL | Status: DC | PRN
  Administered 2019-10-16 – 2019-10-18 (×3): 1 via ORAL
  Filled 2019-10-16 (×3): qty 1

## 2019-10-16 NOTE — Progress Notes (Signed)
Patient ID: Jason Patterson, male   DOB: 02-Dec-1955, 63 y.o.   MRN: [id number] Psychiatrist evaluation from Dr Danella Sensing 10/15/2019 says no sitter needed. RN can continue safety rounds. D/c sitter

## 2019-10-16 NOTE — Progress Notes (Signed)
[place] [facility] [place] NAME: Jason Patterson    MR#:  [id number]  DATE OF BIRTH:  02/05/1956  SUBJECTIVE:   No new complaints per RN.  He tolerated his diet well. Ambulates around the nurses station. Calm REVIEW OF SYSTEMS:   Review of Systems  Unable to perform ROS: Psychiatric disorder   Tolerating Diet:yes Tolerating PT: ambulates in the hallways  DRUG ALLERGIES:   Allergies  Allergen Reactions  . Paxil [Paroxetine Hcl] Other (See Comments)    Told by MD to discontinue use  . Penicillins Other (See Comments)    Family history of allergies; patient's sister took once and died as a result (FYI).  Amoxicillin is ok  . Tramadol Other (See Comments)    Seizure disorder.  Caused seizure.  . Depakote [Divalproex Sodium] Hives and Swelling  . Acyclovir And Related     VITALS:  Blood pressure 121/66, pulse 72, temperature 97.9 F (36.6 C), temperature source Oral, resp. rate 18, height 5\' 7"  (1.702 m), weight 71.9 kg, SpO2 100 %.  PHYSICAL EXAMINATION:   Physical Exam  GENERAL:  63 y.o.-year-old patient lying in the bed with no acute distress.  EYES: Pupils equal, round, reactive to light and accommodation. No scleral icterus. Extraocular muscles intact.  HEENT: Head atraumatic, normocephalic. Oropharynx and nasopharynx clear.  NECK:  Supple, no jugular venous distention. No thyroid enlargement, no tenderness.  LUNGS: Normal breath sounds bilaterally, no wheezing, rales, rhonchi. No use of accessory muscles of respiration.  CARDIOVASCULAR: S1, S2 normal. No murmurs, rubs, or gallops.  ABDOMEN: Soft, nontender, nondistended. Bowel sounds present. No organomegaly or mass.  EXTREMITIES: No cyanosis, clubbing or edema b/l.    NEUROLOGIC: Neuro exam grossly nonfocal No focal Motor or sensory deficits b/l.   PSYCHIATRIC:  patient is alert .  SKIN: No obvious rash, lesion, or ulcer.   LABORATORY PANEL:  CBC Recent Labs  Lab  10/10/19 0827  WBC 6.3  HGB 12.0*  HCT 36.9*  PLT 300    Chemistries  No results for input(s): NA, K, CL, CO2, GLUCOSE, BUN, CREATININE, CALCIUM, MG, AST, ALT, ALKPHOS, BILITOT in the last 168 hours.  Invalid input(s): GFRCGP Cardiac Enzymes No results for input(s): TROPONINI in the last 168 hours. RADIOLOGY:  No results found. ASSESSMENT AND PLAN:  63 year old male with past medical history of COPD, bipolar disorder, schizoaffective disorder, hypertension and seizure disorder presented to the emergency room on 11/15 with complaints of left-sided chest pain  * Chest pain/Atypical/Noncardiac, non-STEMI now ruled out.  - Possibly esophagitis--now resolved  -2D echo unrevealing.  - CT angiogram negative for PE.  Noted circumferential esophageal wall thickening suggestive of esophagitis -  Barium swallow unrevealing.   *Essential hypertension: Continue metoprolol, blood pressure stable.  *Anemia of chronic disease: Patient's hemoglobin dropped from 10.9 on admission down to 7.6.  However some of this may be more from hemoconcentration.  He was on a heparin drip for this 24 hours.  Received 1 unit packed red blood cells on 11/17.  Hemoglobin has since improved and remained stable.  *Bipolar disorder/schizoaffective disorder/anxiety disorder - Patient was threatening suicide.  He was previously given ECT as an outpatient. - Psychiatry followed up and feels patient is not suicidal risk. - Psych has signed off and they don't feel there is any indication for pt to go to [facility] -on Loxitane and Zyprexa -prn xanax--now changed to ativan prn -pt has tendency to vocalize suicidal threats to staff. He was  evaluated by Dr Lenon Ahmadi Psych on 10/16/2019 and recommend cont present meds and change xanax to ativan  *BPH: Continue Flomax.  Code Status: Full code Family Communication: CSW updated power of attorney for d/c plans Disposition Plan: Social work looking at different placement  options DVT Prophylaxis :ambulation  TOTAL TIME TAKING CARE OF THIS PATIENT: *20* minutes.  >50% time spent on counselling and coordination of care  POSSIBLE D/C IN *?* DAYS, DEPENDING ON CLINICAL CONDITION-- awaiting placement.  Note: This dictation was prepared with Dragon dictation along with smaller phrase technology. Any transcriptional errors that result from this process are unintentional.  Enedina Finner M.D on 10/16/2019 at 1:08 PM  Between 7am to 6pm - Pager - [phone]  After 6pm go to [email or web address]  [facility]   CC: Primary care physician; Jason Patterson, NPPatient ID: Jason Patterson, male   DOB: 05/18/1956, 63 y.o.   MRN: [id number]

## 2019-10-17 ENCOUNTER — Encounter: Payer: Self-pay | Admitting: Internal Medicine

## 2019-10-17 MED ORDER — LOXAPINE SUCCINATE 5 MG PO CAPS
25.0000 mg | ORAL_CAPSULE | Freq: Three times a day (TID) | ORAL | Status: DC
  Administered 2019-10-17 – 2019-10-21 (×11): 25 mg via ORAL
  Filled 2019-10-17 (×18): qty 5

## 2019-10-17 MED ORDER — LORAZEPAM 0.5 MG PO TABS
0.5000 mg | ORAL_TABLET | Freq: Four times a day (QID) | ORAL | Status: DC | PRN
  Administered 2019-10-17 – 2019-10-30 (×21): 0.5 mg via ORAL
  Filled 2019-10-17 (×21): qty 1

## 2019-10-17 NOTE — Progress Notes (Signed)
Progress Note    Jason Patterson  DJT:[id number] DOB: 10-16-1956  DOA: 10/03/2019 PCP: [name], NP      Brief Narrative:    Medical records reviewed and are as summarized below:  Jason Patterson is an 63 y.o. male with history of hypertension, anemia of chronic disease, bipolar disorder, schizoaffective disorder, anxiety disorder, COPD, remote history of seizure disorder was brought to the hospital because of chest pain and shortness of breath.  Troponin was elevated on admission.  2D echo was unremarkable.  ACS was ruled out.  He was transfused with 1 unit of packed red blood cells because of worsening anemia.  He was evaluated by the psychiatrist for agitation and medical nonadherence.  He is awaiting placement     Assessment/Plan:   Active Problems:   Generalized anxiety disorder   Essential hypertension   COPD (chronic obstructive pulmonary disease) (HCC)   CHEST PAIN UNSPECIFIED   Schizoaffective disorder, bipolar type (HCC)   Seizures (HCC)   Elevated troponin   Body mass index is 24.83 kg/m.   Atypical chest pain: Resolved  Hypertension: Continue antihypertensives  Anemia of chronic disease: s/p transfusion with 1 unit of packed red blood cells on 1117.  H&H stable.  Bipolar disorder/schizoaffective disorder/anxiety disorder: Continue psychotropics.  Ativan as needed for anxiety.  BPH: Continue Flomax   Family Communication/Anticipated D/C date and plan/Code Status   DVT prophylaxis: Patient is ambulatory Code Status: Full code Family Communication: Plan discussed with the patient Disposition Plan: Awaiting placement.      Subjective:   I had to call patient to come to his room since he was ambulating in the hallway.  He does not provide much history.  He said "you people are trying to kill me".  He does not also my questions    Objective:    Vitals:   10/16/19 1049 10/16/19 2047 10/17/19 0453 10/17/19 0920  BP: 121/66  118/71 95/61 118/73  Pulse: 72 67 63 76  Resp:  18 18   Temp: 97.9 F (36.6 C) 97.7 F (36.5 C) (!) 97.4 F (36.3 C) 98.2 F (36.8 C)  TempSrc: Oral Oral Oral Oral  SpO2: 100% 99% 100% 100%  Weight:      Height:        Intake/Output Summary (Last 24 hours) at 10/17/2019 1137 Last data filed at 10/17/2019 0932 Gross per 24 hour  Intake 963 ml  Output -  Net 963 ml   Filed Weights   10/13/19 0502 10/14/19 0307 10/15/19 0016  Weight: 69.6 kg 70 kg 71.9 kg    Exam:  He refuses to be examined.    He is not in any acute respiratory distress.    Data Reviewed:   I have personally reviewed following labs and imaging studies:  Labs: Labs show the following:   Basic Metabolic Panel: No results for input(s): NA, K, CL, CO2, GLUCOSE, BUN, CREATININE, CALCIUM, MG, PHOS in the last 168 hours. GFR Estimated Creatinine Clearance: 87.2 mL/min (by C-G formula based on SCr of 0.8 mg/dL). Liver Function Tests: No results for input(s): AST, ALT, ALKPHOS, BILITOT, PROT, ALBUMIN in the last 168 hours. No results for input(s): LIPASE, AMYLASE in the last 168 hours. No results for input(s): AMMONIA in the last 168 hours. Coagulation profile No results for input(s): INR, PROTIME in the last 168 hours.  CBC: No results for input(s): WBC, NEUTROABS, HGB, HCT, MCV, PLT in the last 168 hours. Cardiac Enzymes: No results for  input(s): CKTOTAL, CKMB, CKMBINDEX, TROPONINI in the last 168 hours. BNP (last 3 results) No results for input(s): PROBNP in the last 8760 hours. CBG: No results for input(s): GLUCAP in the last 168 hours. D-Dimer: No results for input(s): DDIMER in the last 72 hours. Hgb A1c: No results for input(s): HGBA1C in the last 72 hours. Lipid Profile: No results for input(s): CHOL, HDL, LDLCALC, TRIG, CHOLHDL, LDLDIRECT in the last 72 hours. Thyroid function studies: No results for input(s): TSH, T4TOTAL, T3FREE, THYROIDAB in the last 72 hours.  Invalid input(s):  FREET3 Anemia work up: No results for input(s): VITAMINB12, FOLATE, FERRITIN, TIBC, IRON, RETICCTPCT in the last 72 hours. Sepsis Labs: No results for input(s): PROCALCITON, WBC, LATICACIDVEN in the last 168 hours.  Microbiology No results found for this or any previous visit (from the past 240 hour(s)).  Procedures and diagnostic studies:  No results found.  Medications:   . aspirin EC  81 mg Oral Daily  . atorvastatin  40 mg Oral q1800  . cholecalciferol  2,000 Units Oral Daily  . famotidine  10 mg Oral BID  . latanoprost  1 drop Both Eyes QHS  . levETIRAcetam  1,000 mg Oral BID  . loxapine  25 mg Oral TID  . metoprolol tartrate  12.5 mg Oral BID  . OLANZapine  20 mg Oral QHS  . senna-docusate  2 tablet Oral BID  . sodium chloride flush  3 mL Intravenous Q12H  . sucralfate  1 g Oral TID WC & HS   Continuous Infusions:   LOS: 14 days   Geovana Gebel  [facility]   *Please refer to [email or web address], password [email or web address] to get updated schedule on who will round on this patient, as hospitalists switch teams weekly. If 7PM-7AM, please contact night-coverage at [email or web address], password [email or web address] for any overnight needs.  10/17/2019, 11:37 AM

## 2019-10-17 NOTE — Progress Notes (Signed)
Patient was very irritable and anxious today. Patient continuously asked for juice and milk despite getting GI distress from excessive consumption. I explained to patient the importance of not over consuming these beverages and he responded "you all are trying to kill me." Attempted to reassure patient and use calming techniques (music, verbal reassurance, anxiety medication) but he remained very paranoid and irritable throughout shift.  Marry Guan, RN 10/17/2019 8:05 PM

## 2019-10-18 NOTE — Progress Notes (Signed)
Pt has been paranoid and anxious today. He asked this primary nurse several times if he is gonna die tonight because he is so nervous!  RN re- assured pt that he is safe and in good hands.

## 2019-10-18 NOTE — Progress Notes (Signed)
Progress Note    Jason Patterson  TDH:[id number] DOB: May 29, 1956  DOA: 10/03/2019 PCP: [name], NP      Brief Narrative:    Medical records reviewed and are as summarized below:  Oluwatimileyin Vivier is an 63 y.o. male with history of hypertension, anemia of chronic disease, bipolar disorder, schizoaffective disorder, anxiety disorder, COPD, remote history of seizure disorder was brought to the hospital because of chest pain and shortness of breath.  Troponin was elevated on admission.  2D echo was unremarkable.  ACS was ruled out.  He was transfused with 1 unit of packed red blood cells because of worsening anemia.  He was evaluated by the psychiatrist for agitation and medical nonadherence.  He is awaiting placement     Assessment/Plan:   Active Problems:   Generalized anxiety disorder   Essential hypertension   COPD (chronic obstructive pulmonary disease) (HCC)   CHEST PAIN UNSPECIFIED   Schizoaffective disorder, bipolar type (HCC)   Seizures (HCC)   Elevated troponin   Body mass index is 24.83 kg/m.   Atypical chest pain: Resolved  Hypertension: Continue antihypertensives  Anemia of chronic disease: s/p transfusion with 1 unit of packed red blood cells on 1117.  H&H stable.  Bipolar disorder/schizoaffective disorder/anxiety disorder: Continue psychotropics. Ativan as needed for anxiety.  BPH: Continue Flomax   Family Communication/Anticipated D/C date and plan/Code Status   DVT prophylaxis: Patient is ambulatory Code Status: Full code Family Communication: Plan discussed with the patient Disposition Plan: Awaiting placement.      Subjective:   He does not provide any history.  He was sitting on his bed when I walked into his room.  He still believes that the staff and other people are trying to kill him.     Objective:    Vitals:   10/17/19 1202 10/17/19 2050 10/18/19 0516 10/18/19 1218  BP: 107/73 99/69 117/73 96/70  Pulse:  75 72 72 75  Resp: 18 20 20 16   Temp: 98.3 F (36.8 C) 97.9 F (36.6 C) 98.6 F (37 C) 98.3 F (36.8 C)  TempSrc: Oral Oral Oral Oral  SpO2: 97% 99% 100% 97%  Weight:      Height:        Intake/Output Summary (Last 24 hours) at 10/18/2019 1429 Last data filed at 10/18/2019 1300 Gross per 24 hour  Intake 530 ml  Output 0 ml  Net 530 ml   Filed Weights   10/13/19 0502 10/14/19 0307 10/15/19 0016  Weight: 69.6 kg 70 kg 71.9 kg    Exam:  He does not want to be examined.  He is not in any acute respiratory distress.    Data Reviewed:   I have personally reviewed following labs and imaging studies:  Labs: Labs show the following:   Basic Metabolic Panel: No results for input(s): NA, K, CL, CO2, GLUCOSE, BUN, CREATININE, CALCIUM, MG, PHOS in the last 168 hours. GFR Estimated Creatinine Clearance: 87.2 mL/min (by C-G formula based on SCr of 0.8 mg/dL). Liver Function Tests: No results for input(s): AST, ALT, ALKPHOS, BILITOT, PROT, ALBUMIN in the last 168 hours. No results for input(s): LIPASE, AMYLASE in the last 168 hours. No results for input(s): AMMONIA in the last 168 hours. Coagulation profile No results for input(s): INR, PROTIME in the last 168 hours.  CBC: No results for input(s): WBC, NEUTROABS, HGB, HCT, MCV, PLT in the last 168 hours. Cardiac Enzymes: No results for input(s): CKTOTAL, CKMB, CKMBINDEX, TROPONINI in the  last 168 hours. BNP (last 3 results) No results for input(s): PROBNP in the last 8760 hours. CBG: No results for input(s): GLUCAP in the last 168 hours. D-Dimer: No results for input(s): DDIMER in the last 72 hours. Hgb A1c: No results for input(s): HGBA1C in the last 72 hours. Lipid Profile: No results for input(s): CHOL, HDL, LDLCALC, TRIG, CHOLHDL, LDLDIRECT in the last 72 hours. Thyroid function studies: No results for input(s): TSH, T4TOTAL, T3FREE, THYROIDAB in the last 72 hours.  Invalid input(s): FREET3 Anemia work up: No  results for input(s): VITAMINB12, FOLATE, FERRITIN, TIBC, IRON, RETICCTPCT in the last 72 hours. Sepsis Labs: No results for input(s): PROCALCITON, WBC, LATICACIDVEN in the last 168 hours.  Microbiology No results found for this or any previous visit (from the past 240 hour(s)).  Procedures and diagnostic studies:  No results found.  Medications:   . aspirin EC  81 mg Oral Daily  . atorvastatin  40 mg Oral q1800  . cholecalciferol  2,000 Units Oral Daily  . famotidine  10 mg Oral BID  . latanoprost  1 drop Both Eyes QHS  . levETIRAcetam  1,000 mg Oral BID  . loxapine  25 mg Oral TID  . metoprolol tartrate  12.5 mg Oral BID  . OLANZapine  20 mg Oral QHS  . senna-docusate  2 tablet Oral BID  . sodium chloride flush  3 mL Intravenous Q12H  . sucralfate  1 g Oral TID WC & HS   Continuous Infusions:   LOS: 15 days   Jerre Diguglielmo  [facility]   *Please refer to [email or web address], password [email or web address] to get updated schedule on who will round on this patient, as hospitalists switch teams weekly. If 7PM-7AM, please contact night-coverage at [email or web address], password [email or web address] for any overnight needs.  10/18/2019, 2:29 PM

## 2019-10-19 NOTE — Progress Notes (Signed)
Nutrition Brief Note  Patient identified for LOS  62 y.o. male with history of hypertension, anemia of chronic disease, bipolar disorder, schizoaffective disorder, anxiety disorder, COPD, remote history of seizure disorder was brought to the hospital because of chest pain and shortness of breath. Pt currently awaiting placement.   Wt Readings from Last 15 Encounters:  10/15/19 71.9 kg  07/26/19 70.3 kg  11/28/18 72.6 kg  11/27/18 72.6 kg  10/06/18 78 kg  08/27/18 78 kg  03/18/16 72.6 kg  03/18/16 74.8 kg  02/23/16 74.8 kg  01/19/16 77.6 kg  01/13/16 77.6 kg  01/07/16 81.2 kg  11/15/15 93.4 kg  05/31/15 67.1 kg  04/06/15 67.1 kg    Body mass index is 24.83 kg/m. Patient meets criteria for normal weight based on current BMI.   Current diet order is regular, patient is consuming approximately 100% of meals at this time. Labs and medications reviewed.   No nutrition interventions warranted at this time. If nutrition issues arise, please consult RD.   Koleen Distance MS, RD, LDN Pager #- [phone] Office#- [phone] After Hours Pager: [phone]

## 2019-10-19 NOTE — Progress Notes (Signed)
   10/19/19 1200  Clinical Encounter Type  Visited With Patient  Visit Type Follow-up;Spiritual support  Referral From Chaplain  Consult/Referral To Chaplain  Spiritual Encounters  Spiritual Needs Emotional  Chaplain tried to follow up with patient, however he declined visit with Chaplain. Patient did mention staff did not like him. Chaplain told patient that staff did like him and that is why he decline visit. Patient appeared to be agitated.

## 2019-10-19 NOTE — Progress Notes (Addendum)
Progress Note    [name]  BTD:[id number] DOB: Jason 08, 1957  DOA: 10/03/2019 PCP: Terrill Mohr, NP      Brief Narrative:    Medical records reviewed and are as summarized below:  Jason Patterson is an 63 y.o. male with history of hypertension, anemia of chronic disease, bipolar disorder, schizoaffective disorder, anxiety disorder, COPD, remote history of seizure disorder was brought to the hospital because of chest pain and shortness of breath.  Troponin was elevated on admission.  2D echo was unremarkable.  ACS was ruled out.  He was transfused with 1 unit of packed red blood cells because of worsening anemia.  He was evaluated by the psychiatrist for agitation and medical nonadherence.  He is awaiting placement     Assessment/Plan:   Active Problems:   Generalized anxiety disorder   Essential hypertension   COPD (chronic obstructive pulmonary disease) (HCC)   CHEST PAIN UNSPECIFIED   Schizoaffective disorder, bipolar type (HCC)   Seizures (HCC)   Elevated troponin   Body mass index is 24.83 kg/m.   Atypical chest pain: Resolved  Hypertension: Continue antihypertensives  Anemia of chronic disease: s/p transfusion with 1 unit of packed red blood cells on 1117.  H&H stable.  Bipolar disorder/schizoaffective disorder/anxiety disorder: Continue psychotropics. Ativan as needed for anxiety.  Seizure disorder: Continue Keppra  BPH: Continue Flomax   Family Communication/Anticipated D/C date and plan/Code Status   DVT prophylaxis: Patient is ambulatory Code Status: Full code Family Communication: Patient informed of the care plan Disposition Plan: Awaiting placement to assisted living facility.      Subjective:   Patient was standing in his room when I walked in.  I heard him talking to the nurse assistant.  However, when I asked him a question, he just kept collecting stool still/motionless.  He did not answer any of my questions.     Objective:    Vitals:   10/18/19 1218 10/18/19 2047 10/18/19 2055 10/19/19 0530  BP: 96/70 100/60 100/60 102/64  Pulse: 75 76 76 68  Resp: 16  18 16   Temp: 98.3 F (36.8 C)  98.9 F (37.2 C) 98.5 F (36.9 C)  TempSrc: Oral  Oral Oral  SpO2: 97%  95% 97%  Weight:      Height:        Intake/Output Summary (Last 24 hours) at 10/19/2019 1235 Last data filed at 10/19/2019 0917 Gross per 24 hour  Intake 720 ml  Output 0 ml  Net 720 ml   Filed Weights   10/13/19 0502 10/14/19 0307 10/15/19 0016  Weight: 69.6 kg 70 kg 71.9 kg    Exam:  He was standing by the bedside initially when I walked in.  Her CNA and nurse were present in the room as well.  He refused physical examination.   Data Reviewed:   I have personally reviewed following labs and imaging studies:  Labs: Labs show the following:   Basic Metabolic Panel: No results for input(s): NA, K, CL, CO2, GLUCOSE, BUN, CREATININE, CALCIUM, MG, PHOS in the last 168 hours. GFR Estimated Creatinine Clearance: 87.2 mL/min (by C-G formula based on SCr of 0.8 mg/dL). Liver Function Tests: No results for input(s): AST, ALT, ALKPHOS, BILITOT, PROT, ALBUMIN in the last 168 hours. No results for input(s): LIPASE, AMYLASE in the last 168 hours. No results for input(s): AMMONIA in the last 168 hours. Coagulation profile No results for input(s): INR, PROTIME in the last 168 hours.  CBC: No results  for input(s): WBC, NEUTROABS, HGB, HCT, MCV, PLT in the last 168 hours. Cardiac Enzymes: No results for input(s): CKTOTAL, CKMB, CKMBINDEX, TROPONINI in the last 168 hours. BNP (last 3 results) No results for input(s): PROBNP in the last 8760 hours. CBG: No results for input(s): GLUCAP in the last 168 hours. D-Dimer: No results for input(s): DDIMER in the last 72 hours. Hgb A1c: No results for input(s): HGBA1C in the last 72 hours. Lipid Profile: No results for input(s): CHOL, HDL, LDLCALC, TRIG, CHOLHDL, LDLDIRECT in the last  72 hours. Thyroid function studies: No results for input(s): TSH, T4TOTAL, T3FREE, THYROIDAB in the last 72 hours.  Invalid input(s): FREET3 Anemia work up: No results for input(s): VITAMINB12, FOLATE, FERRITIN, TIBC, IRON, RETICCTPCT in the last 72 hours. Sepsis Labs: No results for input(s): PROCALCITON, WBC, LATICACIDVEN in the last 168 hours.  Microbiology No results found for this or any previous visit (from the past 240 hour(s)).  Procedures and diagnostic studies:  No results found.  Medications:   . aspirin EC  81 mg Oral Daily  . atorvastatin  40 mg Oral q1800  . cholecalciferol  2,000 Units Oral Daily  . famotidine  10 mg Oral BID  . latanoprost  1 drop Both Eyes QHS  . levETIRAcetam  1,000 mg Oral BID  . loxapine  25 mg Oral TID  . metoprolol tartrate  12.5 mg Oral BID  . OLANZapine  20 mg Oral QHS  . senna-docusate  2 tablet Oral BID  . sodium chloride flush  3 mL Intravenous Q12H  . sucralfate  1 g Oral TID WC & HS   Continuous Infusions:   LOS: 16 days   Natnael Biederman  [facility]   *Please refer to [email or web address], password [email or web address] to get updated schedule on who will round on this patient, as hospitalists switch teams weekly. If 7PM-7AM, please contact night-coverage at [email or web address], password [email or web address] for any overnight needs.  10/19/2019, 12:35 PM

## 2019-10-20 MED ORDER — TUBERCULIN PPD 5 UNIT/0.1ML ID SOLN
5.0000 [IU] | Freq: Once | INTRADERMAL | Status: AC
  Administered 2019-10-20: 5 [IU] via INTRADERMAL
  Filled 2019-10-20: qty 0.1

## 2019-10-20 NOTE — Care Plan (Signed)
Returned a phone call from Celene Skeen legal guardian. She is reaching out to this staff member for transfer of services of the patient to [facility] for the patient to have a continuation of ECT. We have collaborated and found a physician in which she will reach out to Dr. Minette Brine to request acceptance of the patient. Dr. Weber Cooks will be consulted regarding the next plan for the patient to receive ECT.

## 2019-10-20 NOTE — TOC Progression Note (Addendum)
Transition of Care [facility]) - Progression Note    Patient Details  Name: Jason Patterson MRN: [id number] Date of Birth: 1956/11/07  [facility] CM/SW [place], [place] Phone Number: 10/20/2019, 3:32 PM  Clinical Narrative:     CSW received a call back from pt's legal guardian, Freda Munro. Freda Munro stated that she is waiting on Dr. Kathreen Cosier and Sharl Ma to call her back to coordinate a new ECT provider since they are moving. Group home needs a new ECT provider. [facility] and Sharl Ma were out all last week and came back today. Will not be able to contact Freda Munro until end of today/tomorrow. Freda Munro is waiting on that.  Patient will need ECT before leaving the hospital. [facility] does ECT again on Friday.  Patient will need an updated COVID test and TB test for group home.   Expected Discharge Plan: Group Home Barriers to Discharge: Continued Medical Work up  Expected Discharge Plan and Services Expected Discharge Plan: Group Home In-house Referral: Clinical Social Work Discharge Planning Services: NA   Living arrangements for the past 2 months: Group Home                           HH Arranged: NA [place] Agency: NA         Social Determinants of Health (SDOH) Interventions    Readmission Risk Interventions No flowsheet data found.

## 2019-10-20 NOTE — Progress Notes (Signed)
Progress Note    [name]  PIR:[id number] DOB: 11-10-1956  DOA: 10/03/2019 PCP: Terrill Mohr, NP      Brief Narrative:    Medical records reviewed and are as summarized below:  Jason Patterson is an 63 y.o. male with history of hypertension, anemia of chronic disease, bipolar disorder, schizoaffective disorder, anxiety disorder, COPD, remote history of seizure disorder was brought to the hospital because of chest pain and shortness of breath.  Troponin was elevated on admission.  2D echo was unremarkable.  ACS was ruled out.  He was transfused with 1 unit of packed red blood cells because of worsening anemia.  He was evaluated by the psychiatrist for agitation and medical nonadherence.  He is awaiting placement     Assessment/Plan:   Active Problems:   Generalized anxiety disorder   Essential hypertension   COPD (chronic obstructive pulmonary disease) (HCC)   CHEST PAIN UNSPECIFIED   Schizoaffective disorder, bipolar type (HCC)   Seizures (HCC)   Elevated troponin   Body mass index is 24.83 kg/m.   Atypical chest pain: Resolved  Hypertension: Continue antihypertensives  Anemia of chronic disease: s/p transfusion with 1 unit of packed red blood cells on 1117.  H&H stable.  Bipolar disorder/schizoaffective disorder/anxiety disorder: Continue psychotropics. Ativan as needed for anxiety. Patient will require ECT prior to discharge.  Psychiatry working on that.  Seizure disorder: Continue Keppra  BPH: Continue Flomax   Family Communication/Anticipated D/C date and plan/Code Status   DVT prophylaxis: Patient is ambulatory Code Status: Full code Family Communication: Patient informed of the care plan Disposition Plan: Awaiting placement to assisted living facility. Patient will go to another group home facility.  We will get PPD testing as well as Covid testing.   Subjective:   Walking around the hallways.  Denies any acute complaint.   Reports that he is feeling scared in his room.  No nausea no vomiting no chest pain abdominal pain.  Objective:    Vitals:   10/19/19 1304 10/19/19 2024 10/20/19 0551 10/20/19 1100  BP: 97/64 102/69 108/67 106/74  Pulse: 87 79 62 75  Resp:  18 20 18   Temp: 97.8 F (36.6 C) 98.7 F (37.1 C) 98.1 F (36.7 C) 98 F (36.7 C)  TempSrc:  Oral Oral Oral  SpO2: 98% 95% 98% 97%  Weight:      Height:        Intake/Output Summary (Last 24 hours) at 10/20/2019 1935 Last data filed at 10/20/2019 1600 Gross per 24 hour  Intake 760 ml  Output -  Net 760 ml   Filed Weights   10/13/19 0502 10/14/19 0307 10/15/19 0016  Weight: 69.6 kg 70 kg 71.9 kg    Exam:  General: alert and oriented to place, and person. Appear in mild distress, affect anxious Eyes: PERRL, Conjunctiva normal ENT: Oral Mucosa Clear, moist  Neck: no JVD, no Abnormal Mass Or lumps Cardiovascular: S1 and S2 Present, no Murmur, peripheral pulses symmetrical Respiratory: good respiratory effort, Bilateral Air entry equal and Decreased, no signs of accessory muscle use, Clear to Auscultation, no Crackles, no wheezes Abdomen: Bowel Sound present, Soft and no tenderness, no hernia Skin: no rashes  Extremities: no Pedal edema, no calf tenderness Neurologic: without any new focal findings Gait not checked due to patient safety concerns    Data Reviewed:   I have personally reviewed following labs and imaging studies:  Labs: Labs show the following:   Basic Metabolic Panel: No results  for input(s): NA, K, CL, CO2, GLUCOSE, BUN, CREATININE, CALCIUM, MG, PHOS in the last 168 hours. GFR Estimated Creatinine Clearance: 87.2 mL/min (by C-G formula based on SCr of 0.8 mg/dL). Liver Function Tests: No results for input(s): AST, ALT, ALKPHOS, BILITOT, PROT, ALBUMIN in the last 168 hours. No results for input(s): LIPASE, AMYLASE in the last 168 hours. No results for input(s): AMMONIA in the last 168 hours. Coagulation  profile No results for input(s): INR, PROTIME in the last 168 hours.  CBC: No results for input(s): WBC, NEUTROABS, HGB, HCT, MCV, PLT in the last 168 hours. Cardiac Enzymes: No results for input(s): CKTOTAL, CKMB, CKMBINDEX, TROPONINI in the last 168 hours. BNP (last 3 results) No results for input(s): PROBNP in the last 8760 hours. CBG: No results for input(s): GLUCAP in the last 168 hours. D-Dimer: No results for input(s): DDIMER in the last 72 hours. Hgb A1c: No results for input(s): HGBA1C in the last 72 hours. Lipid Profile: No results for input(s): CHOL, HDL, LDLCALC, TRIG, CHOLHDL, LDLDIRECT in the last 72 hours. Thyroid function studies: No results for input(s): TSH, T4TOTAL, T3FREE, THYROIDAB in the last 72 hours.  Invalid input(s): FREET3 Anemia work up: No results for input(s): VITAMINB12, FOLATE, FERRITIN, TIBC, IRON, RETICCTPCT in the last 72 hours. Sepsis Labs: No results for input(s): PROCALCITON, WBC, LATICACIDVEN in the last 168 hours.  Microbiology No results found for this or any previous visit (from the past 240 hour(s)).  Procedures and diagnostic studies:  No results found.  Medications:   . aspirin EC  81 mg Oral Daily  . atorvastatin  40 mg Oral q1800  . cholecalciferol  2,000 Units Oral Daily  . famotidine  10 mg Oral BID  . latanoprost  1 drop Both Eyes QHS  . levETIRAcetam  1,000 mg Oral BID  . loxapine  25 mg Oral TID  . metoprolol tartrate  12.5 mg Oral BID  . OLANZapine  20 mg Oral QHS  . senna-docusate  2 tablet Oral BID  . sodium chloride flush  3 mL Intravenous Q12H  . sucralfate  1 g Oral TID WC & HS  . tuberculin  5 Units Intradermal Once   Continuous Infusions:   LOS: 17 days   Tanette Chauca  [facility]   *Please refer to [email or web address], password [email or web address] to get updated schedule on who will round on this patient, as hospitalists switch teams weekly. If 7PM-7AM, please contact night-coverage at [email or web address], password [email or web address] for  any overnight needs.  10/20/2019, 7:35 PM

## 2019-10-21 ENCOUNTER — Other Ambulatory Visit: Payer: Self-pay | Admitting: Psychiatry

## 2019-10-21 LAB — SARS CORONAVIRUS 2 (TAT 6-24 HRS): SARS Coronavirus 2: NEGATIVE

## 2019-10-21 MED ORDER — LOXAPINE SUCCINATE 5 MG PO CAPS
25.0000 mg | ORAL_CAPSULE | Freq: Three times a day (TID) | ORAL | Status: DC
  Administered 2019-10-21 – 2019-10-22 (×3): 25 mg via ORAL
  Filled 2019-10-21 (×5): qty 1

## 2019-10-21 MED ORDER — SODIUM CHLORIDE 0.9 % IV SOLN
500.0000 mL | Freq: Once | INTRAVENOUS | Status: AC
  Administered 2019-10-22: 10:00:00 via INTRAVENOUS

## 2019-10-21 NOTE — Progress Notes (Signed)
Progress Note    Jason Patterson  WOE:[id number] DOB: 28-Feb-1956  DOA: 10/03/2019 PCP: [name], NP    Brief Narrative:    Medical records reviewed and are as summarized below:  Jason Patterson is an 63 y.o. male with history of hypertension, anemia of chronic disease, bipolar disorder, schizoaffective disorder, anxiety disorder, COPD, remote history of seizure disorder was brought to the hospital because of chest pain and shortness of breath.  Troponin was elevated on admission.  2D echo was unremarkable.  ACS was ruled out.  He was transfused with 1 unit of packed red blood cells because of worsening anemia.  He was evaluated by the psychiatrist for agitation and medical nonadherence.  He is awaiting placement   Assessment/Plan:   Active Problems:   Generalized anxiety disorder   Essential hypertension   COPD (chronic obstructive pulmonary disease) (HCC)   CHEST PAIN UNSPECIFIED   Schizoaffective disorder, bipolar type (HCC)   Seizures (HCC)   Elevated troponin   Body mass index is 24.83 kg/m.   Atypical chest pain: Resolved  Hypertension: Continue antihypertensives  Anemia of chronic disease: s/p transfusion with 1 unit of packed red blood cells on 1117.  H&H stable.  Bipolar disorder/schizoaffective disorder/anxiety disorder: Continue psychotropics. Ativan as needed for anxiety. Patient will require ECT prior to discharge.  Psychiatry working on that.  Seizure disorder: Continue Keppra  BPH: Continue Flomax   Family Communication/Anticipated D/C date and plan/Code Status   DVT prophylaxis: Patient is ambulatory Code Status: Full code Family Communication: Patient informed of the care plan Disposition Plan: Patient will go to another group home facility.  We will get PPD testing as well as Covid testing.   Subjective:   No acute complaint no nausea no vomiting no acute events.   Objective:    Vitals:   10/20/19 2105 10/21/19 0600  10/21/19 0933 10/21/19 1155  BP: 101/72 97/62 112/76 110/69  Pulse: 73 67 76 81  Resp: 18 16  14   Temp: 98 F (36.7 C) 99.3 F (37.4 C)  98.1 F (36.7 C)  TempSrc: Oral Oral  Oral  SpO2: 96% 95%  99%  Weight:      Height:        Intake/Output Summary (Last 24 hours) at 10/21/2019 1847 Last data filed at 10/21/2019 1439 Gross per 24 hour  Intake 303 ml  Output -  Net 303 ml   Filed Weights   10/13/19 0502 10/14/19 0307 10/15/19 0016  Weight: 69.6 kg 70 kg 71.9 kg    Exam:  General: alert and oriented to place, and person. Appear in mild distress, affect anxious Eyes: PERRL, Conjunctiva normal ENT: Oral Mucosa Clear, moist  Neck: no JVD, no Abnormal Mass Or lumps Cardiovascular: S1 and S2 Present, no Murmur, peripheral pulses symmetrical Respiratory: good respiratory effort, Bilateral Air entry equal and Decreased, no signs of accessory muscle use, Clear to Auscultation, no Crackles, no wheezes Abdomen: Bowel Sound present, Soft and no tenderness, no hernia Skin: no rashes  Extremities: no Pedal edema, no calf tenderness Neurologic: without any new focal findings Gait not checked due to patient safety concerns    Data Reviewed:   I have personally reviewed following labs and imaging studies:  Labs: Labs show the following:   Basic Metabolic Panel: No results for input(s): NA, K, CL, CO2, GLUCOSE, BUN, CREATININE, CALCIUM, MG, PHOS in the last 168 hours. GFR Estimated Creatinine Clearance: 87.2 mL/min (by C-G formula based on SCr of 0.8  mg/dL). Liver Function Tests: No results for input(s): AST, ALT, ALKPHOS, BILITOT, PROT, ALBUMIN in the last 168 hours. No results for input(s): LIPASE, AMYLASE in the last 168 hours. No results for input(s): AMMONIA in the last 168 hours. Coagulation profile No results for input(s): INR, PROTIME in the last 168 hours.  CBC: No results for input(s): WBC, NEUTROABS, HGB, HCT, MCV, PLT in the last 168 hours. Cardiac Enzymes: No  results for input(s): CKTOTAL, CKMB, CKMBINDEX, TROPONINI in the last 168 hours. BNP (last 3 results) No results for input(s): PROBNP in the last 8760 hours. CBG: No results for input(s): GLUCAP in the last 168 hours. D-Dimer: No results for input(s): DDIMER in the last 72 hours. Hgb A1c: No results for input(s): HGBA1C in the last 72 hours. Lipid Profile: No results for input(s): CHOL, HDL, LDLCALC, TRIG, CHOLHDL, LDLDIRECT in the last 72 hours. Thyroid function studies: No results for input(s): TSH, T4TOTAL, T3FREE, THYROIDAB in the last 72 hours.  Invalid input(s): FREET3 Anemia work up: No results for input(s): VITAMINB12, FOLATE, FERRITIN, TIBC, IRON, RETICCTPCT in the last 72 hours. Sepsis Labs: No results for input(s): PROCALCITON, WBC, LATICACIDVEN in the last 168 hours.  Microbiology Recent Results (from the past 240 hour(s))  SARS CORONAVIRUS 2 (TAT 6-24 HRS) Nasopharyngeal Nasopharyngeal Swab     Status: None   Collection Time: 10/20/19 11:03 PM   Specimen: Nasopharyngeal Swab  Result Value Ref Range Status   SARS Coronavirus 2 NEGATIVE NEGATIVE Final    Comment: (NOTE) SARS-CoV-2 target nucleic acids are NOT DETECTED. The SARS-CoV-2 RNA is generally detectable in upper and lower respiratory specimens during the acute phase of infection. Negative results do not preclude SARS-CoV-2 infection, do not rule out co-infections with other pathogens, and should not be used as the sole basis for treatment or other patient management decisions. Negative results must be combined with clinical observations, patient history, and epidemiological information. The expected result is Negative. Fact Sheet for Patients: HairSlick.nohttps://www.fda.gov/media/138098/download Fact Sheet for Healthcare Providers: quierodirigir.comhttps://www.fda.gov/media/138095/download This test is not yet approved or cleared by the [place] FDA and  has been authorized for detection and/or diagnosis of SARS-CoV-2 by FDA  under an Emergency Use Authorization (EUA). This EUA will remain  in effect (meaning this test can be used) for the duration of the COVID-19 declaration under Section 56 4(b)(1) of the Act, 21 U.S.C. section 360bbb-3(b)(1), unless the authorization is terminated or revoked sooner. Performed at [facility], [address] [place], [place] [address]     Procedures and diagnostic studies:  No results found.  Medications:   . aspirin EC  81 mg Oral Daily  . atorvastatin  40 mg Oral q1800  . cholecalciferol  2,000 Units Oral Daily  . famotidine  10 mg Oral BID  . latanoprost  1 drop Both Eyes QHS  . levETIRAcetam  1,000 mg Oral BID  . loxapine  25 mg Oral TID  . metoprolol tartrate  12.5 mg Oral BID  . OLANZapine  20 mg Oral QHS  . senna-docusate  2 tablet Oral BID  . sodium chloride flush  3 mL Intravenous Q12H  . sucralfate  1 g Oral TID WC & HS  . tuberculin  5 Units Intradermal Once   Continuous Infusions:   LOS: 18 days      [facility]   *Please refer to [email or web address], password [email or web address] to get updated schedule on who will round on this patient, as hospitalists switch teams weekly. If 7PM-7AM, please contact  night-coverage at [email or web address], password [email or web address] for any overnight needs.  10/21/2019, 6:47 PM

## 2019-10-21 NOTE — Consult Note (Signed)
  ECT: Patient seen chart reviewed.  Contact made with current treatment team.  64 year old man very well-known to me with schizoaffective disorder with chronic and recurrent depressive symptoms.  Patient had been receiving maintenance electroconvulsive therapy treatment usually somewhere between every 3-4 or 5 weeks as a chronic part of his treatment to keep him from slipping into severe depression.  Came to see patient this afternoon.  He looked in good spirits.  He said he was feeling good.  Denied suicidal thoughts or hallucinations.  Made good eye contact.  Seem to be neatly groomed and appropriately interactive.  I informed him that we were recommending that he get ECT tomorrow morning as it looks like discharge is imminent.  Patient claimed to know nothing about his discharge plan.  I told him that as far as I knew he was going to be going to a new group home but I was not certain about the exact place or time but that we may need to arrange for different ECT providers.  Patient seemed to understand that.  I have put in orders for him to be n.p.o. tonight and we will call for him between 9 and 10:00 tomorrow morning.  Patient was perfectly agreeable to ECT treatment

## 2019-10-22 ENCOUNTER — Inpatient Hospital Stay: Payer: Medicaid Other | Admitting: Anesthesiology

## 2019-10-22 ENCOUNTER — Telehealth: Payer: Self-pay

## 2019-10-22 DIAGNOSIS — F250 Schizoaffective disorder, bipolar type: Secondary | ICD-10-CM | POA: Diagnosis not present

## 2019-10-22 MED ORDER — LOXAPINE SUCCINATE 5 MG PO CAPS
25.0000 mg | ORAL_CAPSULE | Freq: Three times a day (TID) | ORAL | Status: DC
  Administered 2019-10-22 – 2019-10-30 (×23): 25 mg via ORAL
  Filled 2019-10-22 (×27): qty 1

## 2019-10-22 MED ORDER — LOXAPINE SUCCINATE 10 MG PO CAPS: 20.0000 mg | ORAL_CAPSULE | ORAL | Status: DC

## 2019-10-22 MED ORDER — FENTANYL CITRATE (PF) 100 MCG/2ML IJ SOLN: 25.0000 ug | INTRAMUSCULAR | Status: DC | PRN

## 2019-10-22 MED ORDER — METHOHEXITAL SODIUM 100 MG/10ML IV SOSY
PREFILLED_SYRINGE | INTRAVENOUS | Status: DC | PRN
  Administered 2019-10-22: 70 mg via INTRAVENOUS

## 2019-10-22 MED ORDER — ONDANSETRON HCL 4 MG/2ML IJ SOLN: 4.0000 mg | Freq: Once | INTRAMUSCULAR | Status: DC | PRN

## 2019-10-22 MED ORDER — LOXAPINE SUCCINATE 10 MG PO CAPS: 10.0000 mg | ORAL_CAPSULE | ORAL | Status: DC

## 2019-10-22 MED ORDER — SUCCINYLCHOLINE CHLORIDE 20 MG/ML IJ SOLN
INTRAMUSCULAR | Status: DC | PRN
  Administered 2019-10-22: 90 mg via INTRAVENOUS

## 2019-10-22 MED ORDER — LABETALOL HCL 5 MG/ML IV SOLN
INTRAVENOUS | Status: DC | PRN
  Administered 2019-10-22: 10 mg via INTRAVENOUS

## 2019-10-22 NOTE — Anesthesia Postprocedure Evaluation (Signed)
Anesthesia Post Note  Patient: Jason Patterson  Procedure(s) Performed: ECT TX  Patient location during evaluation: PACU Anesthesia Type: General Level of consciousness: awake and alert and oriented Pain management: pain level controlled Vital Signs Assessment: post-procedure vital signs reviewed and stable Respiratory status: spontaneous breathing Cardiovascular status: blood pressure returned to baseline Anesthetic complications: no     Last Vitals:  Vitals:   10/22/19 1045 10/22/19 1047  BP: 108/73 108/73  Pulse: 78   Resp: 20   Temp: 36.5 C   SpO2: 93%     Last Pain:  Vitals:   10/22/19 1045  TempSrc:   PainSc: 0-No pain                 Render Marley

## 2019-10-22 NOTE — Transfer of Care (Signed)
Immediate Anesthesia Transfer of Care Note  Patient: Jason Patterson  Procedure(s) Performed: ECT TX  Patient Location: PACU  Anesthesia Type:General  Level of Consciousness: drowsy and patient cooperative  Airway & Oxygen Therapy: Patient Spontanous Breathing and Patient connected to face mask oxygen  Post-op Assessment: Report given to RN  Post vital signs: Reviewed and stable  Last Vitals:  Vitals Value Taken Time  BP 124/83 10/22/19 1025  Temp    Pulse 75 10/22/19 1027  Resp 16 10/22/19 1027  SpO2 100 % 10/22/19 1027  Vitals shown include unvalidated device data.  Last Pain:  Vitals:   10/22/19 0937  TempSrc:   PainSc: 0-No pain      Patients Stated Pain Goal: Other (Comment)(patient refused pain medication) (44/01/02 7253)  Complications: No apparent anesthesia complications

## 2019-10-22 NOTE — Progress Notes (Signed)
Progress Note    Jason Patterson  HKV:[id number] DOB: May 08, 1956  DOA: 10/03/2019 PCP: [name], NP    Brief Narrative:    Medical records reviewed and are as summarized below:  Jason Patterson is an 63 y.o. male with history of hypertension, anemia of chronic disease, bipolar disorder, schizoaffective disorder, anxiety disorder, COPD, remote history of seizure disorder was brought to the hospital because of chest pain and shortness of breath.  Troponin was elevated on admission.  2D echo was unremarkable.  ACS was ruled out.  He was transfused with 1 unit of packed red blood cells because of worsening anemia.  He was evaluated by the psychiatrist for agitation and medical nonadherence.  He is awaiting placement   Assessment/Plan:   Active Problems:   Generalized anxiety disorder   Essential hypertension   COPD (chronic obstructive pulmonary disease) (HCC)   CHEST PAIN UNSPECIFIED   Schizoaffective disorder, bipolar type (HCC)   Seizures (HCC)   Elevated troponin   Body mass index is 24.83 kg/m.   Atypical chest pain: Resolved  Hypertension: Continue antihypertensives  Anemia of chronic disease: s/p transfusion with 1 unit of packed red blood cells on 1117.  H&H stable.  Bipolar disorder/schizoaffective disorder/anxiety disorder: Continue psychotropics. Ativan as needed for anxiety. Patient will require ECT prior to discharge. Appreciate psychiatry's assistance. Patient SP ECT. Currently working on group home in [place].  Seizure disorder: Continue Keppra  BPH: Continue Flomax   Family Communication/Anticipated D/C date and plan/Code Status   DVT prophylaxis: Patient is ambulatory Code Status: Full code Family Communication: Patient informed of the care plan Disposition Plan: Patient will go to another group home facility.  We will get PPD testing as well as Covid testing.   Subjective:   No acute events.  No nausea no vomiting.   Awake.   Objective:    Vitals:   10/22/19 1035 10/22/19 1045 10/22/19 1047 10/22/19 1251  BP: 116/81 108/73 108/73 107/77  Pulse: 75 78  66  Resp: (!) 24 20  16   Temp:  97.7 F (36.5 C)    TempSrc:      SpO2: 94% 93%  98%  Weight:      Height:        Intake/Output Summary (Last 24 hours) at 10/22/2019 1938 Last data filed at 10/22/2019 1023 Gross per 24 hour  Intake 340 ml  Output -  Net 340 ml   Filed Weights   10/13/19 0502 10/14/19 0307 10/15/19 0016  Weight: 69.6 kg 70 kg 71.9 kg    Exam:  General: alert and oriented to place, and person. Appear in mild distress, affect anxious Eyes: PERRL, Conjunctiva normal ENT: Oral Mucosa Clear, moist  Neck: no JVD, no Abnormal Mass Or lumps Cardiovascular: S1 and S2 Present, no Murmur, peripheral pulses symmetrical Respiratory: good respiratory effort, Bilateral Air entry equal and Decreased, no signs of accessory muscle use, Clear to Auscultation, no Crackles, no wheezes Abdomen: Bowel Sound present, Soft and no tenderness, no hernia Skin: no rashes  Extremities: no Pedal edema, no calf tenderness Neurologic: without any new focal findings Gait not checked due to patient safety concerns    Data Reviewed:   I have personally reviewed following labs and imaging studies:  Labs: Labs show the following:   Basic Metabolic Panel: No results for input(s): NA, K, CL, CO2, GLUCOSE, BUN, CREATININE, CALCIUM, MG, PHOS in the last 168 hours. GFR Estimated Creatinine Clearance: 87.2 mL/min (by C-G formula based  on SCr of 0.8 mg/dL). Liver Function Tests: No results for input(s): AST, ALT, ALKPHOS, BILITOT, PROT, ALBUMIN in the last 168 hours. No results for input(s): LIPASE, AMYLASE in the last 168 hours. No results for input(s): AMMONIA in the last 168 hours. Coagulation profile No results for input(s): INR, PROTIME in the last 168 hours.  CBC: No results for input(s): WBC, NEUTROABS, HGB, HCT, MCV, PLT in the last 168  hours. Cardiac Enzymes: No results for input(s): CKTOTAL, CKMB, CKMBINDEX, TROPONINI in the last 168 hours. BNP (last 3 results) No results for input(s): PROBNP in the last 8760 hours. CBG: No results for input(s): GLUCAP in the last 168 hours. D-Dimer: No results for input(s): DDIMER in the last 72 hours. Hgb A1c: No results for input(s): HGBA1C in the last 72 hours. Lipid Profile: No results for input(s): CHOL, HDL, LDLCALC, TRIG, CHOLHDL, LDLDIRECT in the last 72 hours. Thyroid function studies: No results for input(s): TSH, T4TOTAL, T3FREE, THYROIDAB in the last 72 hours.  Invalid input(s): FREET3 Anemia work up: No results for input(s): VITAMINB12, FOLATE, FERRITIN, TIBC, IRON, RETICCTPCT in the last 72 hours. Sepsis Labs: No results for input(s): PROCALCITON, WBC, LATICACIDVEN in the last 168 hours.  Microbiology Recent Results (from the past 240 hour(s))  SARS CORONAVIRUS 2 (TAT 6-24 HRS) Nasopharyngeal Nasopharyngeal Swab     Status: None   Collection Time: 10/20/19 11:03 PM   Specimen: Nasopharyngeal Swab  Result Value Ref Range Status   SARS Coronavirus 2 NEGATIVE NEGATIVE Final    Comment: (NOTE) SARS-CoV-2 target nucleic acids are NOT DETECTED. The SARS-CoV-2 RNA is generally detectable in upper and lower respiratory specimens during the acute phase of infection. Negative results do not preclude SARS-CoV-2 infection, do not rule out co-infections with other pathogens, and should not be used as the sole basis for treatment or other patient management decisions. Negative results must be combined with clinical observations, patient history, and epidemiological information. The expected result is Negative. Fact Sheet for Patients: SugarRoll.be Fact Sheet for Healthcare Providers: https://www.woods-mathews.com/ This test is not yet approved or cleared by the [place] FDA and  has been authorized for detection and/or  diagnosis of SARS-CoV-2 by FDA under an Emergency Use Authorization (EUA). This EUA will remain  in effect (meaning this test can be used) for the duration of the COVID-19 declaration under Section 56 4(b)(1) of the Act, 21 U.S.C. section 360bbb-3(b)(1), unless the authorization is terminated or revoked sooner. Performed at [facility], [place] [address], [place], [place] [address]     Procedures and diagnostic studies:  No results found.  Medications:   . aspirin EC  81 mg Oral Daily  . atorvastatin  40 mg Oral q1800  . cholecalciferol  2,000 Units Oral Daily  . famotidine  10 mg Oral BID  . latanoprost  1 drop Both Eyes QHS  . levETIRAcetam  1,000 mg Oral BID  . loxapine  25 mg Oral TID  . metoprolol tartrate  12.5 mg Oral BID  . OLANZapine  20 mg Oral QHS  . senna-docusate  2 tablet Oral BID  . sodium chloride flush  3 mL Intravenous Q12H  . sucralfate  1 g Oral TID WC & HS  . tuberculin  5 Units Intradermal Once   Continuous Infusions:   LOS: 19 days   [facility]   *Please refer to [email or web address], password [email or web address] to get updated schedule on who will round on this patient, as hospitalists switch teams weekly.  If 7PM-7AM, please contact night-coverage at [email or web address], password [email or web address] for any overnight needs.  10/22/2019, 7:38 PM
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
## 2019-10-22 NOTE — TOC Progression Note (Signed)
Transition of Care [facility]) - Progression Note    Patient Details  Name: Jason Patterson MRN: [id number] Date of Birth: March 13, 1956  [facility] CM/SW Contact  Beverly Sessions, RN Phone Number: 10/22/2019, 5:02 PM  Clinical Narrative:     Per secure chat message psych MD has Dr. Antonieta Pert is a Psychiatry Specialist in [place], [place]. MIA is the intake person at the office   Voicemail left for guardian Freda Munro to update   Expected Discharge Plan: Group Home Barriers to Discharge: Continued Medical Work up  Expected Discharge Plan and Services Expected Discharge Plan: Group Home In-house Referral: Clinical Social Work Discharge Planning Services: NA   Living arrangements for the past 2 months: Group Home                           HH Arranged: NA [place] Agency: NA         Social Determinants of Health (SDOH) Interventions    Readmission Risk Interventions No flowsheet data found.

## 2019-10-22 NOTE — Anesthesia Post-op Follow-up Note (Signed)
Anesthesia QCDR form completed.        

## 2019-10-22 NOTE — Procedures (Signed)
ECT SERVICES Physician's Interval Evaluation & Treatment Note  Patient Identification: Jason Patterson MRN:  [id number] Date of Evaluation:  10/22/2019 TX #: 23 in this series  MADRS:   MMSE:   P.E. Findings:  No change to physical exam.  Vital stable.  Psychiatric Interval Note:  Patient is anxious as usual but cooperative not threatening and not obviously psychotic  Subjective:  Patient is a 63 y.o. male seen for evaluation for Electroconvulsive Therapy. Complains of anxiety especially regarding discharge  Treatment Summary:   [x]   Right Unilateral             []  Bilateral   % Energy : 0.3 ms 100%   Impedance: 1200 ohms  Seizure Energy Index: 6377 V squared  Postictal Suppression Index: 83%  Seizure Concordance Index: 88%  Medications  Pre Shock: Robinul 0.1 mg, Toradol 30 mg, labetalol 10 mg, Brevital 70 mg, succinylcholine 100 mg  Post Shock: None  Seizure Duration: 17 seconds by motor movement 29 seconds by EEG   Comments: Patient does best when he has treatments every 3 to 4 weeks at most.  I am hoping that guardian and others looking out for him will be able to arrange follow-up  Lungs:  [x]   Clear to auscultation               []  Other:   Heart:    [x]   Regular rhythm             []  irregular rhythm    [x]   Previous H&P reviewed, patient examined and there are NO CHANGES                 []   Previous H&P reviewed, patient examined and there are changes noted.   Alethia Berthold, MD 12/4/20204:18 PM

## 2019-10-22 NOTE — Anesthesia Preprocedure Evaluation (Addendum)
Anesthesia Evaluation  Patient identified by MRN, date of birth, ID band Patient awake    Reviewed: Allergy & Precautions, NPO status , Patient's Chart, lab work & pertinent test results  History of Anesthesia Complications Negative for: history of anesthetic complications  Airway Mallampati: III  TM Distance: >3 FB Neck ROM: Full    Dental  (+) Poor Dentition   Pulmonary shortness of breath and with exertion, COPD,  COPD inhaler, Current Smoker,    breath sounds clear to auscultation- rhonchi (-) wheezing      Cardiovascular hypertension, Pt. on medications (-) CAD, (-) Past MI, (-) Cardiac Stents and (-) CABG Dysrhythmias: RBBB.  Rhythm:Regular Rate:Normal - Systolic murmurs and - Diastolic murmurs    Neuro/Psych  Headaches, Seizures -, Well Controlled,  PSYCHIATRIC DISORDERS Anxiety Depression Bipolar Disorder Schizophrenia  Neuromuscular disease    GI/Hepatic Neg liver ROS, GERD  ,  Endo/Other  negative endocrine ROSneg diabetes  Renal/GU Renal InsufficiencyRenal disease     Musculoskeletal negative musculoskeletal ROS (+)   Abdominal (+) - obese,   Peds  Hematology negative hematology ROS (+)   Anesthesia Other Findings Past Medical History: No date: Anxiety No date: Bipolar disorder, unspecified ([employer]) No date: Coarse tremors No date: COPD (chronic obstructive pulmonary disease) (HCC) No date: Depression No date: Diastolic dysfunction No date: Dysrhythmia No date: Essential hypertension, benign No date: Generalized anxiety disorder No date: GERD (gastroesophageal reflux disease) No date: Headache(784.0) No date: Physical deconditioning No date: Psoriasis No date: Rhabdomyolysis No date: Schizoaffective disorder, unspecified condition No date: Seizure disorder ([place]) No date: Seizures ([place])     Comment:  NONE SINCE AGE 14 No date: Sepsis ([place]) No date: Shortness of breath     Comment:  W/ EXERTION   Chest pain evaluated mid November and ruled out for cardiac etiology.  Reproductive/Obstetrics                           Anesthesia Physical  Anesthesia Plan  ASA: III  Anesthesia Plan: General   Post-op Pain Management:    Induction: Intravenous  PONV Risk Score and Plan: 0  Airway Management Planned: Mask  Additional Equipment:   Intra-op Plan:   Post-operative Plan:   Informed Consent: I have reviewed the patients History and Physical, chart, labs and discussed the procedure including the risks, benefits and alternatives for the proposed anesthesia with the patient or authorized representative who has indicated his/her understanding and acceptance.     Dental advisory given  Plan Discussed with: CRNA and Anesthesiologist  Anesthesia Plan Comments:         Anesthesia Quick Evaluation
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
## 2019-10-23 NOTE — Progress Notes (Signed)
Progress Note    Jason Patterson  WNI:[id number] DOB: 02/03/1956  DOA: 10/03/2019 PCP: [name], NP    Brief Narrative:    Medical records reviewed and are as summarized below:  Jason Patterson is an 63 y.o. male with history of hypertension, anemia of chronic disease, bipolar disorder, schizoaffective disorder, anxiety disorder, COPD, remote history of seizure disorder was brought to the hospital because of chest pain and shortness of breath.  Troponin was elevated on admission.  2D echo was unremarkable.  ACS was ruled out.  He was transfused with 1 unit of packed red blood cells because of worsening anemia.  He was evaluated by the psychiatrist for agitation and medical nonadherence.  He is awaiting placement   Assessment/Plan:   Active Problems:   Generalized anxiety disorder   Essential hypertension   COPD (chronic obstructive pulmonary disease) (HCC)   CHEST PAIN UNSPECIFIED   Schizoaffective disorder, bipolar type (HCC)   Seizures (HCC)   Elevated troponin   Body mass index is 24.83 kg/m.   Atypical chest pain: Resolved no evidence of STEMI or non-STEMI.  Hypertension: Continue antihypertensives  Anemia of chronic disease: s/p transfusion with 1 unit of packed red blood cells on 1117.  H&H stable.  Bipolar disorder/schizoaffective disorder/anxiety disorder: Continue psychotropics. Ativan as needed for anxiety. Patient underwent ECT prior to discharge.  On 10/22/2019 Appreciate psychiatry's assistance. Currently working on getting the patient to go new group home in [facility].  Seizure disorder: No active seizures. Continue Keppra  BPH: Continue Flomax   Family Communication/Anticipated D/C date and plan/Code Status   DVT prophylaxis: Patient is ambulatory Code Status: Full code Family Communication: Patient informed of the care plan Disposition Plan: Patient will go to another group home facility.  We will get PPD testing as well  as Covid testing.   Subjective:   Denies any acute complaint.  No nausea no vomiting.  Ambulating in the hallway.   Objective:    Vitals:   10/22/19 1251 10/22/19 2114 10/23/19 0602 10/23/19 1131  BP: 107/77 96/61 97/62  105/70  Pulse: 66 77 67 76  Resp: 16 20 20 18   Temp:  98.2 F (36.8 C) 98 F (36.7 C) 97.6 F (36.4 C)  TempSrc:    Oral  SpO2: 98% 97% 99% 100%  Weight:      Height:        Intake/Output Summary (Last 24 hours) at 10/23/2019 1236 Last data filed at 10/23/2019 0900 Gross per 24 hour  Intake 480 ml  Output -  Net 480 ml   Filed Weights   10/13/19 0502 10/14/19 0307 10/15/19 0016  Weight: 69.6 kg 70 kg 71.9 kg    Exam:  General: alert and oriented to place, and person. Appear in no distress, affect appropriate Eyes: PERRL, Conjunctiva normal ENT: Oral Mucosa Clear, moist  Neck: no JVD, no Abnormal Mass Or lumps Cardiovascular: S1 and S2 Present, no Murmur, peripheral pulses symmetrical Respiratory: good respiratory effort, Bilateral Air entry equal and Decreased, no signs of accessory muscle use, Clear to Auscultation, no Crackles, no wheezes Abdomen: Bowel Sound present, Soft and no tenderness, no hernia Skin: no rashes  Extremities: no Pedal edema, no calf tenderness Neurologic: without any new focal findings Gait not checked due to patient safety concerns    Data Reviewed:   I have personally reviewed following labs and imaging studies:  Labs: Labs show the following:   Basic Metabolic Panel: No results for input(s): NA, K,  CL, CO2, GLUCOSE, BUN, CREATININE, CALCIUM, MG, PHOS in the last 168 hours. GFR Estimated Creatinine Clearance: 87.2 mL/min (by C-G formula based on SCr of 0.8 mg/dL). Liver Function Tests: No results for input(s): AST, ALT, ALKPHOS, BILITOT, PROT, ALBUMIN in the last 168 hours. No results for input(s): LIPASE, AMYLASE in the last 168 hours. No results for input(s): AMMONIA in the last 168 hours. Coagulation  profile No results for input(s): INR, PROTIME in the last 168 hours.  CBC: No results for input(s): WBC, NEUTROABS, HGB, HCT, MCV, PLT in the last 168 hours. Cardiac Enzymes: No results for input(s): CKTOTAL, CKMB, CKMBINDEX, TROPONINI in the last 168 hours. BNP (last 3 results) No results for input(s): PROBNP in the last 8760 hours. CBG: No results for input(s): GLUCAP in the last 168 hours. D-Dimer: No results for input(s): DDIMER in the last 72 hours. Hgb A1c: No results for input(s): HGBA1C in the last 72 hours. Lipid Profile: No results for input(s): CHOL, HDL, LDLCALC, TRIG, CHOLHDL, LDLDIRECT in the last 72 hours. Thyroid function studies: No results for input(s): TSH, T4TOTAL, T3FREE, THYROIDAB in the last 72 hours.  Invalid input(s): FREET3 Anemia work up: No results for input(s): VITAMINB12, FOLATE, FERRITIN, TIBC, IRON, RETICCTPCT in the last 72 hours. Sepsis Labs: No results for input(s): PROCALCITON, WBC, LATICACIDVEN in the last 168 hours.  Microbiology Recent Results (from the past 240 hour(s))  SARS CORONAVIRUS 2 (TAT 6-24 HRS) Nasopharyngeal Nasopharyngeal Swab     Status: None   Collection Time: 10/20/19 11:03 PM   Specimen: Nasopharyngeal Swab  Result Value Ref Range Status   SARS Coronavirus 2 NEGATIVE NEGATIVE Final    Comment: (NOTE) SARS-CoV-2 target nucleic acids are NOT DETECTED. The SARS-CoV-2 RNA is generally detectable in upper and lower respiratory specimens during the acute phase of infection. Negative results do not preclude SARS-CoV-2 infection, do not rule out co-infections with other pathogens, and should not be used as the sole basis for treatment or other patient management decisions. Negative results must be combined with clinical observations, patient history, and epidemiological information. The expected result is Negative. Fact Sheet for Patients: HairSlick.nohttps://www.fda.gov/media/138098/download Fact Sheet for Healthcare Providers:  quierodirigir.comhttps://www.fda.gov/media/138095/download This test is not yet approved or cleared by the [place] FDA and  has been authorized for detection and/or diagnosis of SARS-CoV-2 by FDA under an Emergency Use Authorization (EUA). This EUA will remain  in effect (meaning this test can be used) for the duration of the COVID-19 declaration under Section 56 4(b)(1) of the Act, 21 U.S.C. section 360bbb-3(b)(1), unless the authorization is terminated or revoked sooner. Performed at [facility], [address] [place], [place] [address]     Procedures and diagnostic studies:  No results found.  Medications:   . aspirin EC  81 mg Oral Daily  . atorvastatin  40 mg Oral q1800  . cholecalciferol  2,000 Units Oral Daily  . famotidine  10 mg Oral BID  . latanoprost  1 drop Both Eyes QHS  . levETIRAcetam  1,000 mg Oral BID  . loxapine  25 mg Oral TID  . metoprolol tartrate  12.5 mg Oral BID  . OLANZapine  20 mg Oral QHS  . senna-docusate  2 tablet Oral BID  . sodium chloride flush  3 mL Intravenous Q12H  . sucralfate  1 g Oral TID WC & HS   Continuous Infusions:   LOS: 20 days   Lynden OxfordPranav Karsen Fellows  [facility]   *Please refer to [email or web address], password [email or web address] to  get updated schedule on who will round on this patient, as hospitalists switch teams weekly. If 7PM-7AM, please contact night-coverage at [email or web address], password [email or web address] for any overnight needs.  10/23/2019, 12:36 PM

## 2019-10-24 NOTE — Progress Notes (Signed)
Progress Note    Jason ShadeGordon Lee Aggarwal  WUJ:[id number] DOB: 09-22-1956  DOA: 10/03/2019 PCP: Koren BoundMatrone, Andrew, NP    Brief Narrative:    Medical records reviewed and are as summarized below:  Jason Patterson is an 63 y.o. male with history of hypertension, anemia of chronic disease, bipolar disorder, schizoaffective disorder, anxiety disorder, COPD, remote history of seizure disorder was brought to the hospital because of chest pain and shortness of breath.  Troponin was elevated on admission.  2D echo was unremarkable.  ACS was ruled out.  He was transfused with 1 unit of packed red blood cells because of worsening anemia.  He was evaluated by the psychiatrist for agitation and medical nonadherence.  He is awaiting placement. No change in pt condition.   Assessment/Plan:   Active Problems:   Generalized anxiety disorder   Essential hypertension   COPD (chronic obstructive pulmonary disease) (HCC)   CHEST PAIN UNSPECIFIED   Schizoaffective disorder, bipolar type (HCC)   Seizures (HCC)   Elevated troponin   Body mass index is 24.83 kg/m.   Atypical chest pain: Resolved no evidence of STEMI or non-STEMI.  Hypertension: Continue antihypertensives  Anemia of chronic disease: s/p transfusion with 1 unit of packed red blood cells on 1117.  H&H stable.  Bipolar disorder/schizoaffective disorder/anxiety disorder: Continue psychotropics. Ativan as needed for anxiety. Patient underwent ECT prior to discharge.  On 10/22/2019 Appreciate psychiatry's assistance. Currently working on getting the patient [place] [facility] [place].  Seizure disorder: No active seizures. Continue Keppra  BPH: Continue Flomax   Family Communication/Anticipated D/C date and plan/Code Status   DVT prophylaxis: Patient is ambulatory Code Status: Full code Family Communication: Patient informed of the care plan Disposition Plan: Patient will go to another group home  [facility]ility[facility] Tuesday per the caregiver.    Subjective:   Denies any acute complaint.   Objective:    Vitals:   10/23/19 2142 10/24/19 0605 10/24/19 1022 10/24/19 1201  BP: 106/68 96/67 112/66 113/73  Pulse: 80 64 85 74  Resp: (!) 24 18  16   Temp: 98.2 F (36.8 C) 98 F (36.7 C)  97.8 F (36.6 C)  TempSrc: Oral Oral  Oral  SpO2: 98% 96% 100% 99%  Weight:      Height:        Intake/Output Summary (Last 24 hours) at 10/24/2019 1828 Last data filed at 10/24/2019 1300 Gross per 24 hour  Intake 1200 ml  Output -  Net 1200 ml   Filed Weights   10/13/19 0502 10/14/19 0307 10/15/19 0016  Weight: 69.6 kg 70 kg 71.9 kg    Exam:  General: alert and oriented to place, and person. Appear in no distress, affect appropriate Eyes: PERRL, Conjunctiva normal ENT: Oral Mucosa Clear, moist  Neck: no JVD, no Abnormal Mass Or lumps Cardiovascular: S1 and S2 Present, no Murmur, peripheral pulses symmetrical Respiratory: good respiratory effort, Bilateral Air entry equal and Decreased, no signs of accessory muscle use, Clear to Auscultation, no Crackles, no wheezes Abdomen: Bowel Sound present, Soft and no tenderness, no hernia Skin: no rashes  Extremities: no Pedal edema, no calf tenderness Neurologic: without any new focal findings Gait not checked due to patient safety concerns    Data Reviewed:   I have personally reviewed following labs and imaging studies:  Labs: Labs show the following:   Basic Metabolic Panel: No results for input(s): NA, K, CL, CO2, GLUCOSE, BUN, CREATININE, CALCIUM, MG, PHOS in the  last 168 hours. GFR Estimated Creatinine Clearance: 87.2 mL/min (by C-G formula based on SCr of 0.8 mg/dL). Liver Function Tests: No results for input(s): AST, ALT, ALKPHOS, BILITOT, PROT, ALBUMIN in the last 168 hours. No results for input(s): LIPASE, AMYLASE in the last 168 hours. No results for input(s): AMMONIA in the last 168 hours. Coagulation profile No results  for input(s): INR, PROTIME in the last 168 hours.  CBC: No results for input(s): WBC, NEUTROABS, HGB, HCT, MCV, PLT in the last 168 hours. Cardiac Enzymes: No results for input(s): CKTOTAL, CKMB, CKMBINDEX, TROPONINI in the last 168 hours. BNP (last 3 results) No results for input(s): PROBNP in the last 8760 hours. CBG: No results for input(s): GLUCAP in the last 168 hours. D-Dimer: No results for input(s): DDIMER in the last 72 hours. Hgb A1c: No results for input(s): HGBA1C in the last 72 hours. Lipid Profile: No results for input(s): CHOL, HDL, LDLCALC, TRIG, CHOLHDL, LDLDIRECT in the last 72 hours. Thyroid function studies: No results for input(s): TSH, T4TOTAL, T3FREE, THYROIDAB in the last 72 hours.  Invalid input(s): FREET3 Anemia work up: No results for input(s): VITAMINB12, FOLATE, FERRITIN, TIBC, IRON, RETICCTPCT in the last 72 hours. Sepsis Labs: No results for input(s): PROCALCITON, WBC, LATICACIDVEN in the last 168 hours.  Microbiology Recent Results (from the past 240 hour(s))  SARS CORONAVIRUS 2 (TAT 6-24 HRS) Nasopharyngeal Nasopharyngeal Swab     Status: None   Collection Time: 10/20/19 11:03 PM   Specimen: Nasopharyngeal Swab  Result Value Ref Range Status   SARS Coronavirus 2 NEGATIVE NEGATIVE Final    Comment: (NOTE) SARS-CoV-2 target nucleic acids are NOT DETECTED. The SARS-CoV-2 RNA is generally detectable in upper and lower respiratory specimens during the acute phase of infection. Negative results do not preclude SARS-CoV-2 infection, do not rule out co-infections with other pathogens, and should not be used as the sole basis for treatment or other patient management decisions. Negative results must be combined with clinical observations, patient history, and epidemiological information. The expected result is Negative. Fact Sheet for Patients: HairSlick.no Fact Sheet for Healthcare Providers:  quierodirigir.com This test is not yet approved or cleared by the [place] FDA and  has been authorized for detection and/or diagnosis of SARS-CoV-2 by FDA under an Emergency Use Authorization (EUA). This EUA will remain  in effect (meaning this test can be used) for the duration of the COVID-19 declaration under Section 56 4(b)(1) of the Act, 21 U.S.C. section 360bbb-3(b)(1), unless the authorization is terminated or revoked sooner. Performed at [facility], [address], [place], [place] [address]     Procedures and diagnostic studies:  No results found.  Medications:   . aspirin EC  81 mg Oral Daily  . atorvastatin  40 mg Oral q1800  . cholecalciferol  2,000 Units Oral Daily  . famotidine  10 mg Oral BID  . latanoprost  1 drop Both Eyes QHS  . levETIRAcetam  1,000 mg Oral BID  . loxapine  25 mg Oral TID  . metoprolol tartrate  12.5 mg Oral BID  . OLANZapine  20 mg Oral QHS  . senna-docusate  2 tablet Oral BID  . sodium chloride flush  3 mL Intravenous Q12H  . sucralfate  1 g Oral TID WC & HS   Continuous Infusions:   LOS: 21 days   Avan Gullett  [facility]   *Please refer to [email or web address], password [email or web address] to get updated schedule on who will round on this patient,  as hospitalists switch teams weekly. If 7PM-7AM, please contact night-coverage at [email or web address], password [email or web address] for any overnight needs.  10/24/2019, 6:28 PM

## 2019-10-25 NOTE — [place] FL2 (Signed)
[place] MEDICAID FL2 LEVEL OF CARE SCREENING TOOL     IDENTIFICATION  Patient Name: Jason Patterson Birthdate: 10-01-1956 Sex: male Admission Date (Current Location): 10/03/2019  [place] and [place] Number:  [address] [id number] [facility] Facility and Address:  [facility], [address], [place], [place] [address]      Provider Number: [id number]  Attending Physician Name and Address:  Rolly Salter, MD  Relative Name and Phone Number:  Empowering,Lives Guardianship  [facility] Legal Guardian  [phone] or    Current Level of Care: Hospital Recommended Level of Care: Assisted Living Facility Prior Approval Number:    Date Approved/Denied:   PASRR Number:    Discharge Plan: Other (Comment)(Assisted Living Facility)    Current Diagnoses: Patient Active Problem List   Diagnosis Date Noted  . Elevated troponin 10/03/2019  . Respiratory distress 08/28/2018  . Hyperglycemia 08/28/2018  . Acute kidney injury superimposed on chronic kidney disease (HCC) 08/28/2018  . Elevated transaminase level 08/28/2018  . Seizure (HCC) 08/27/2018  . Gastritis 03/25/2015  . Seizures (HCC) 03/24/2015  . Coffee ground emesis   . Syncope 03/23/2015  . Fall 02/16/2015  . Traumatic pneumothorax 02/15/2015  . Rib fractures 02/15/2015  . Sepsis due to urinary tract infection (HCC) 02/03/2015  . Altered mental status 02/03/2015  . SIRS (systemic inflammatory response syndrome) (HCC) 02/03/2015  . Protein-calorie malnutrition, severe (HCC) 02/03/2015  . UTI (lower urinary tract infection)   . Ulnar neuropathy at elbow of left upper extremity 06/23/2014  . Chest pain 05/01/2014  . Schizoaffective disorder, bipolar type (HCC) 04/26/2014  . Generalized anxiety disorder 05/01/2009  . Essential hypertension 05/01/2009  . RIGHT BUNDLE BRANCH BLOCK 05/01/2009  . COPD (chronic obstructive pulmonary disease) (HCC) 05/01/2009  . GERD 05/01/2009  .  PSORIASIS 05/01/2009  . Convulsions (HCC) 05/01/2009  . CHEST PAIN UNSPECIFIED 05/01/2009    Orientation RESPIRATION BLADDER Height & Weight     Self, Time, Situation, Place  Normal Continent Weight: 158 lb 8.2 oz (71.9 kg) Height:  5\' 7"  (170.2 cm)(per pt)  BEHAVIORAL SYMPTOMS/MOOD NEUROLOGICAL BOWEL NUTRITION STATUS  Other (Comment)(Paranoia)   Continent Diet(regular)  AMBULATORY STATUS COMMUNICATION OF NEEDS Skin   Independent Verbally Normal                       Personal Care Assistance Level of Assistance  Bathing, Dressing, Feeding Bathing Assistance: Independent Feeding assistance: Independent Dressing Assistance: Independent     Functional Limitations Info  Sight, Hearing, Speech Sight Info: Adequate Hearing Info: Adequate Speech Info: Adequate    SPECIAL CARE FACTORS FREQUENCY                       Contractures      Additional Factors Info  Code Status, Allergies, Psychotropic Code Status Info: Full Code Allergies Info: Paxil, Penicillins Tramadol Depakote, Acyclovir And Related Psychotropic Info: OLANZapine (ZYPREXA) tablet 20 mg         Current Medications (10/25/2019):  This is the current hospital active medication list Current Facility-Administered Medications  Medication Dose Route Frequency Provider Last Rate Last Dose  . acetaminophen (TYLENOL) tablet 650 mg  650 mg Oral Q4H PRN Mansy, Jan A, MD   650 mg at 10/16/19 1812  . albuterol (PROVENTIL) (2.5 MG/3ML) 0.083% nebulizer solution 3 mL  3 mL Inhalation Q6H PRN Mansy, Jan A, MD      . aspirin EC tablet 81 mg  81 mg Oral Daily Mansy,  Arvella Merles, MD   81 mg at 10/25/19 0843  . atorvastatin (LIPITOR) tablet 40 mg  40 mg Oral q1800 Mansy, Jan A, MD   40 mg at 10/24/19 1701  . cholecalciferol (VITAMIN D3) tablet 2,000 Units  2,000 Units Oral Daily Mansy, Arvella Merles, MD   2,000 Units at 10/25/19 [phone]  . diphenoxylate-atropine (LOMOTIL) 2.5-0.025 MG per tablet 1 tablet  1 tablet Oral TID PRN Fritzi Mandes, MD   1 tablet at 10/18/19 0806  . famotidine (PEPCID) tablet 10 mg  10 mg Oral BID Mansy, Jan A, MD   10 mg at 10/25/19 0842  . latanoprost (XALATAN) 0.005 % ophthalmic solution 1 drop  1 drop Both Eyes QHS Mansy, Jan A, MD   1 drop at 10/24/19 2156  . levETIRAcetam (KEPPRA) tablet 1,000 mg  1,000 mg Oral BID Mansy, Jan A, MD   1,000 mg at 10/25/19 0843  . LORazepam (ATIVAN) tablet 0.5 mg  0.5 mg Oral Q6H PRN Jennye Boroughs, MD   0.5 mg at 10/25/19 0843  . loxapine (LOXITANE) capsule 25 mg  25 mg Oral TID Clapacs, Madie Reno, MD   25 mg at 10/25/19 [phone]  . metoprolol tartrate (LOPRESSOR) tablet 12.5 mg  12.5 mg Oral BID Caren Griffins, MD   12.5 mg at 10/25/19 0842  . nitroGLYCERIN (NITROSTAT) SL tablet 0.4 mg  0.4 mg Sublingual Q5 Min x 3 PRN Mansy, Jan A, MD   0.4 mg at 10/03/19 0936  . OLANZapine (ZYPREXA) tablet 20 mg  20 mg Oral QHS Mansy, Jan A, MD   20 mg at 10/24/19 2159  . ondansetron (ZOFRAN) injection 4 mg  4 mg Intravenous Q6H PRN Mansy, Jan A, MD   4 mg at 10/24/19 1840  . senna-docusate (Senokot-S) tablet 2 tablet  2 tablet Oral BID Caren Griffins, MD   2 tablet at 10/25/19 0936  . sodium chloride flush (NS) 0.9 % injection 3 mL  3 mL Intravenous Q12H Annita Brod, MD   3 mL at 10/25/19 0844  . sucralfate (CARAFATE) 1 GM/10ML suspension 1 g  1 g Oral TID WC & HS Caren Griffins, MD   1 g at 10/25/19 0842  . zolpidem (AMBIEN) tablet 5 mg  5 mg Oral QHS PRN,MR X 1 Mansy, Jan A, MD   5 mg at 10/24/19 2158     Discharge Medications: Please see discharge summary for a list of discharge medications.  Relevant Imaging Results:  Relevant Lab Results:   Additional Information SSN [id number]  Shade Flood, LCSW

## 2019-10-25 NOTE — Progress Notes (Signed)
Progress Note    Jason Patterson  WGN:[id number] DOB: [birth year]  DOA: 10/03/2019 PCP: Koren BoundMatrone, Andrew, NP    Brief Narrative:    Medical records reviewed and are as summarized below:  Jason Patterson is an 63 y.o. male with history of hypertension, anemia of chronic disease, bipolar disorder, schizoaffective disorder, anxiety disorder, COPD, remote history of seizure disorder was brought to the hospital because of chest pain and shortness of breath.  Troponin was elevated on admission.  2D echo was unremarkable.  ACS was ruled out.  He was transfused with 1 unit of packed red blood cells because of worsening anemia.  He was evaluated by the psychiatrist for agitation and medical nonadherence.  He is awaiting placement. No change in pt condition.  Currently awaiting transfer to group home and acceptance for admission there.  Assessment/Plan:   Active Problems:   Generalized anxiety disorder   Essential hypertension   COPD (chronic obstructive pulmonary disease) (HCC)   CHEST PAIN UNSPECIFIED   Schizoaffective disorder, bipolar type (HCC)   Seizures (HCC)   Elevated troponin   Body mass index is 24.83 kg/m.   Atypical chest pain: Resolved no evidence of STEMI or non-STEMI.  Hypertension: Continue antihypertensives  Anemia of chronic disease: s/p transfusion with 1 unit of packed red blood cells on 1117.  H&H stable.  Bipolar disorder/schizoaffective disorder/anxiety disorder: Continue psychotropics. Ativan as needed for anxiety. Patient underwent ECT prior to discharge.  On 10/22/2019 Appreciate psychiatry's assistance. Currently working [place] getting the patient [place] [facility] [place].  Seizure disorder: No active seizures. Continue Keppra  BPH: Continue Flomax   Family Communication/Anticipated D/C date and plan/Code Status   DVT prophylaxis: Patient is ambulatory Code Status: Full code Family Communication: Patient informed of the  care plan Disposition Plan: Patient will go to another group [facility] facility[facility] Tuesday per the caregiver.    Subjective:   No acute complaint no nausea no vomiting.  No fever no chills.  Eating okay.  Objective:    Vitals:   10/24/19 1201 10/24/19 2200 10/25/19 0640 10/25/19 1219  BP: 113/73 125/87 104/68 108/76  Pulse: 74 75 72 69  Resp: 16 18 17 16   Temp: 97.8 F (36.6 C) 98.4 F (36.9 C) 98.9 F (37.2 C) 98.5 F (36.9 C)  TempSrc: Oral Oral Oral Oral  SpO2: 99% 99% 96% 100%  Weight:      Height:        Intake/Output Summary (Last 24 hours) at 10/25/2019 1857 Last data filed at 10/25/2019 1700 Gross per 24 hour  Intake 1440 ml  Output -  Net 1440 ml   Filed Weights   10/13/19 0502 10/14/19 0307 10/15/19 0016  Weight: 69.6 kg 70 kg 71.9 kg    Exam:  General: alert and oriented to place, and person. Appear in no distress, affect appropriate Eyes: PERRL, Conjunctiva normal ENT: Oral Mucosa Clear, moist  Neck: no JVD, no Abnormal Mass Or lumps Cardiovascular: S1 and S2 Present, no Murmur, peripheral pulses symmetrical Respiratory: good respiratory effort, Bilateral Air entry equal and Decreased, no signs of accessory muscle use, Clear to Auscultation, no Crackles, no wheezes Abdomen: Bowel Sound present, Soft and no tenderness, no hernia Skin: no rashes  Extremities: no Pedal edema, no calf tenderness Neurologic: without any new focal findings Gait not checked due to patient safety concerns    Data Reviewed:   I have personally reviewed following labs and imaging studies:  Labs: Labs show  the following:   Basic Metabolic Panel: No results for input(s): NA, K, CL, CO2, GLUCOSE, BUN, CREATININE, CALCIUM, MG, PHOS in the last 168 hours. GFR Estimated Creatinine Clearance: 87.2 mL/min (by C-G formula based on SCr of 0.8 mg/dL). Liver Function Tests: No results for input(s): AST, ALT, ALKPHOS, BILITOT, PROT, ALBUMIN in the last 168 hours. No results for  input(s): LIPASE, AMYLASE in the last 168 hours. No results for input(s): AMMONIA in the last 168 hours. Coagulation profile No results for input(s): INR, PROTIME in the last 168 hours.  CBC: No results for input(s): WBC, NEUTROABS, HGB, HCT, MCV, PLT in the last 168 hours. Cardiac Enzymes: No results for input(s): CKTOTAL, CKMB, CKMBINDEX, TROPONINI in the last 168 hours. BNP (last 3 results) No results for input(s): PROBNP in the last 8760 hours. CBG: No results for input(s): GLUCAP in the last 168 hours. D-Dimer: No results for input(s): DDIMER in the last 72 hours. Hgb A1c: No results for input(s): HGBA1C in the last 72 hours. Lipid Profile: No results for input(s): CHOL, HDL, LDLCALC, TRIG, CHOLHDL, LDLDIRECT in the last 72 hours. Thyroid function studies: No results for input(s): TSH, T4TOTAL, T3FREE, THYROIDAB in the last 72 hours.  Invalid input(s): FREET3 Anemia work up: No results for input(s): VITAMINB12, FOLATE, FERRITIN, TIBC, IRON, RETICCTPCT in the last 72 hours. Sepsis Labs: No results for input(s): PROCALCITON, WBC, LATICACIDVEN in the last 168 hours.  Microbiology Recent Results (from the past 240 hour(s))  SARS CORONAVIRUS 2 (TAT 6-24 HRS) Nasopharyngeal Nasopharyngeal Swab     Status: None   Collection Time: 10/20/19 11:03 PM   Specimen: Nasopharyngeal Swab  Result Value Ref Range Status   SARS Coronavirus 2 NEGATIVE NEGATIVE Final    Comment: (NOTE) SARS-CoV-2 target nucleic acids are NOT DETECTED. The SARS-CoV-2 RNA is generally detectable in upper and lower respiratory specimens during the acute phase of infection. Negative results do not preclude SARS-CoV-2 infection, do not rule out co-infections with other pathogens, and should not be used as the sole basis for treatment or other patient management decisions. Negative results must be combined with clinical observations, patient history, and epidemiological information. The expected result is  Negative. Fact Sheet for Patients: SugarRoll.be Fact Sheet for Healthcare Providers: https://www.woods-mathews.com/ This test is not yet approved or cleared by the [place] FDA and  has been authorized for detection and/or diagnosis of SARS-CoV-2 by FDA under an Emergency Use Authorization (EUA). This EUA will remain  in effect (meaning this test can be used) for the duration of the COVID-19 declaration under Section 56 4(b)(1) of the Act, 21 U.S.C. section 360bbb-3(b)(1), unless the authorization is terminated or revoked sooner. Performed at [facility], [place] [address] [place], [place] [address]     Procedures and diagnostic studies:  No results found.  Medications:   . aspirin EC  81 mg Oral Daily  . atorvastatin  40 mg Oral q1800  . cholecalciferol  2,000 Units Oral Daily  . famotidine  10 mg Oral BID  . latanoprost  1 drop Both Eyes QHS  . levETIRAcetam  1,000 mg Oral BID  . loxapine  25 mg Oral TID  . metoprolol tartrate  12.5 mg Oral BID  . OLANZapine  20 mg Oral QHS  . senna-docusate  2 tablet Oral BID  . sodium chloride flush  3 mL Intravenous Q12H  . sucralfate  1 g Oral TID WC & HS   Continuous Infusions:   LOS: 22 days   Shamela Haydon  Ethelyne Erich  [facility]   *Please refer to [email or web address], password [email or web address] to get updated schedule on who will round on this patient, as hospitalists switch teams weekly. If 7PM-7AM, please contact night-coverage at [email or web address], password [email or web address] for any overnight needs.  10/25/2019, 6:57 PM

## 2019-10-25 NOTE — Progress Notes (Signed)
Nutrition Brief Note  Patient identified for LOS  63 y.o. male with history of hypertension, anemia of chronic disease, bipolar disorder, schizoaffective disorder, anxiety disorder, COPD, remote history of seizure disorder was brought to the hospital because of chest pain and shortness of breath. Pt currently awaiting placement.   Wt Readings from Last 15 Encounters:  10/15/19 71.9 kg  07/26/19 70.3 kg  11/28/18 72.6 kg  11/27/18 72.6 kg  10/06/18 78 kg  08/27/18 78 kg  03/18/16 72.6 kg  03/18/16 74.8 kg  02/23/16 74.8 kg  01/19/16 77.6 kg  01/13/16 77.6 kg  01/07/16 81.2 kg  11/15/15 93.4 kg  05/31/15 67.1 kg  04/06/15 67.1 kg    Body mass index is 24.83 kg/m. Patient meets criteria for normal weight based on current BMI.   Current diet order is regular, patient is consuming approximately 100% of meals at this time. Labs and medications reviewed.   No nutrition interventions warranted at this time. If nutrition issues arise, please consult RD.   Nimrit Kehres MS, RD, LDN Pager #- [phone] Office#- [phone] After Hours Pager: [phone]   

## 2019-10-25 NOTE — TOC Progression Note (Signed)
Transition of Care [facility]) - Progression Note    Patient Details  Name: Gene Glazebrook MRN: [id number] Date of Birth: October 22, 1956  [facility]) CM/SW Contact  Shade Flood, LCSW Phone Number: 10/25/2019, 1:58 PM  Clinical Narrative:     TOC following. Spoke with pt's guardian, Freda Munro, by phone to inquire on status of placement. Faxed COVID results, TB skin test results, and FL2 to Freda Munro at her request. She stated that she will send to the administrator at the ALF pt will be going to at [facility]. Once she sends the information, she will await notification from them on when pt can admit. She will update TOC once she knows.   Assigned TOC will follow.  Expected Discharge Plan: Group Home Barriers to Discharge: Barriers Unresolved (comment)(Awaiting ALF placement)  Expected Discharge Plan and Services Expected Discharge Plan: Group Home In-house Referral: Clinical Social Work Discharge Planning Services: NA   Living arrangements for the past 2 months: Group Home                           HH Arranged: NA [place] Agency: NA         Social Determinants of Health (SDOH) Interventions    Readmission Risk Interventions No flowsheet data found.

## 2019-10-26 ENCOUNTER — Inpatient Hospital Stay: Payer: Medicaid Other

## 2019-10-26 LAB — BASIC METABOLIC PANEL
Anion gap: 10 (ref 5–15)
BUN: 16 mg/dL (ref 8–23)
CO2: 21 mmol/L — ABNORMAL LOW (ref 22–32)
Calcium: 8.2 mg/dL — ABNORMAL LOW (ref 8.9–10.3)
Chloride: 100 mmol/L (ref 98–111)
Creatinine, Ser: 0.81 mg/dL (ref 0.61–1.24)
GFR calc Af Amer: >60 mL/min (ref >60)
GFR calc non Af Amer: >60 mL/min (ref >60)
Glucose, Bld: 108 mg/dL — ABNORMAL HIGH (ref 70–99)
Potassium: 3.3 mmol/L — ABNORMAL LOW (ref 3.5–5.1)
Sodium: 131 mmol/L — ABNORMAL LOW (ref 135–145)

## 2019-10-26 LAB — MAGNESIUM: Magnesium: 1.9 mg/dL (ref 1.7–2.4)

## 2019-10-26 LAB — SARS CORONAVIRUS 2 (TAT 6-24 HRS): SARS Coronavirus 2: NEGATIVE

## 2019-10-26 LAB — GLUCOSE, CAPILLARY: Glucose-Capillary: 92 mg/dL (ref 70–99)

## 2019-10-26 MED ORDER — POTASSIUM CHLORIDE CRYS ER 20 MEQ PO TBCR
20.0000 meq | EXTENDED_RELEASE_TABLET | Freq: Once | ORAL | Status: AC
  Administered 2019-10-26: 22:00:00 20 meq via ORAL
  Filled 2019-10-26: qty 1

## 2019-10-26 MED ORDER — PROMETHAZINE HCL 25 MG PO TABS: 25.0000 mg | ORAL_TABLET | Freq: Once | ORAL | Status: DC

## 2019-10-26 MED ORDER — ONDANSETRON 4 MG PO TBDP
8.0000 mg | ORAL_TABLET | Freq: Once | ORAL | Status: DC
  Administered 2019-10-26: 8 mg via ORAL

## 2019-10-26 MED ORDER — POLYETHYLENE GLYCOL 3350 17 G PO PACK
17.0000 g | PACK | Freq: Every day | ORAL | Status: DC
  Administered 2019-10-26: 22:00:00 17 g via ORAL
  Filled 2019-10-26 (×4): qty 1

## 2019-10-26 MED ORDER — ONDANSETRON 4 MG PO TBDP
4.0000 mg | ORAL_TABLET | Freq: Three times a day (TID) | ORAL | Status: DC | PRN
  Administered 2019-10-26 – 2019-10-30 (×5): 4 mg via ORAL
  Filled 2019-10-26 (×6): qty 1

## 2019-10-26 NOTE — Progress Notes (Signed)
Pt IV was leaking so I removed it. I asked Dr. Posey Pronto if it was ok to keep out because the patient did not want another IV but the patient was feeling some nausea. Dr. Posey Pronto ordered Zofran PO. I gave that and the patient threw up a little while later and I asked Dr. Posey Pronto about something else for nausea. He ordered Zofran and then Phenergan but said he was concerned about giving phenergan because of the other medications the patient was taking. I gave the Zofran and not the phenergan since Dr. Posey Pronto was concerned about giving it.

## 2019-10-26 NOTE — TOC Progression Note (Signed)
Transition of Care [facility]) - Progression Note    Patient Details  Name: Jason Patterson MRN: [id number] Date of Birth: [birth year]  [facility]) CM/SW Contact  Beverly Sessions, RN Phone Number: 10/26/2019, 4:20 PM  Clinical Narrative:    Current medication  list faxed to [address] with ACT team   Expected Discharge Plan: Group Home Barriers to Discharge: Barriers Unresolved (comment)(Awaiting ALF placement)  Expected Discharge Plan and Services Expected Discharge Plan: Group Home In-house Referral: Clinical Social Work Discharge Planning Services: NA   Living arrangements for the past 2 months: Group Home                           HH Arranged: NA [place] Agency: NA         Social Determinants of Health (SDOH) Interventions    Readmission Risk Interventions No flowsheet data found.

## 2019-10-26 NOTE — Progress Notes (Signed)
Progress Note    Jason Patterson  XNA:[id number] DOB: 06-26-1956  DOA: 10/03/2019 PCP: [name], NP    Brief Narrative:    Medical records reviewed and are as summarized below:  Jason Patterson is an 63 y.o. male with history of hypertension, anemia of chronic disease, bipolar disorder, schizoaffective disorder, anxiety disorder, COPD, remote history of seizure disorder was brought to the hospital because of chest pain and shortness of breath.  Troponin was elevated on admission.  2D echo was unremarkable.  ACS was ruled out.  He was transfused with 1 unit of packed red blood cells because of worsening anemia.  He was evaluated by the psychiatrist for agitation and medical nonadherence.  He is awaiting placement. No change in pt condition.  Currently awaiting transfer to group home and acceptance for admission there.  Assessment/Plan:   Active Problems:   Generalized anxiety disorder   Essential hypertension   COPD (chronic obstructive pulmonary disease) (HCC)   CHEST PAIN UNSPECIFIED   Schizoaffective disorder, bipolar type (HCC)   Seizures (HCC)   Elevated troponin   Body mass index is 24.83 kg/m.   Atypical chest pain: Resolved no evidence of STEMI or non-STEMI.  Hypertension: Continue antihypertensives  Anemia of chronic disease:  s/p transfusion with 1 unit of packed red blood cells on 1117.  H&H stable. No active bleeding.  Bipolar disorder/schizoaffective disorder/anxiety disorder:  Continue psychotropics. Ativan as needed for anxiety. Patient underwent ECT prior to discharge.  On 10/22/2019 Appreciate psychiatry's assistance. Currently working on getting the patient to go new group home in [facility]  Seizure disorder: No active seizures. Continue Keppra  BPH: Continue Flomax  Hiatal hernia. GERD. Constipation.   Continue bowel regimen continue PPI continue Carafate. Patient had some episode of nausea as well as 1 episode of  vomiting on 10/26/2019.  X-ray shows no evidence of acute abnormality. No further work-up for now. Added MiraLAX.   Family Communication/Anticipated D/C date and plan/Code Status   DVT prophylaxis: Patient is ambulatory Code Status: Full code Family Communication: Patient informed of the care plan Disposition Plan: Patient will go to another group home facility at [facility].awaiting acceptance to the group home.   Subjective:  1 episode of vomiting 2 episodes of nausea.  No acute complaints.  Able to tolerate dinner after medication.  Objective:    Vitals:   10/25/19 2012 10/26/19 0539 10/26/19 0921 10/26/19 1337  BP: 123/76 98/67 130/79 123/83  Pulse: 75 72 75 85  Resp: 16 16    Temp: 99.7 F (37.6 C) 99.4 F (37.4 C)  98.6 F (37 C)  TempSrc: Oral Oral  Oral  SpO2: 100% 97%  100%  Weight:      Height:        Intake/Output Summary (Last 24 hours) at 10/26/2019 1912 Last data filed at 10/26/2019 1848 Gross per 24 hour  Intake 720 ml  Output 0 ml  Net 720 ml   Filed Weights   10/13/19 0502 10/14/19 0307 10/15/19 0016  Weight: 69.6 kg 70 kg 71.9 kg    Exam:  General: alert and oriented to place, and person. Appear in no distress, affect appropriate Eyes: PERRL, Conjunctiva normal ENT: Oral Mucosa Clear, moist  Neck: no JVD, no Abnormal Mass Or lumps Cardiovascular: S1 and S2 Present, no Murmur, peripheral pulses symmetrical Respiratory: good respiratory effort, Bilateral Air entry equal and Decreased, no signs of accessory muscle use, Clear to Auscultation, no Crackles, no wheezes Abdomen: Bowel  Sound present, Soft and no tenderness, no hernia Skin: no rashes  Extremities: no Pedal edema, no calf tenderness Neurologic: without any new focal findings Gait not checked due to patient safety concerns    Data Reviewed:   I have personally reviewed following labs and imaging studies:  Labs: Labs show the following:   Basic Metabolic Panel: Recent Labs    Lab 10/26/19 1717  NA 131*  K 3.3*  CL 100  CO2 21*  GLUCOSE 108*  BUN 16  CREATININE 0.81  CALCIUM 8.2*  MG 1.9   GFR Estimated Creatinine Clearance: 86.1 mL/min (by C-G formula based on SCr of 0.81 mg/dL). Liver Function Tests: No results for input(s): AST, ALT, ALKPHOS, BILITOT, PROT, ALBUMIN in the last 168 hours. No results for input(s): LIPASE, AMYLASE in the last 168 hours. No results for input(s): AMMONIA in the last 168 hours. Coagulation profile No results for input(s): INR, PROTIME in the last 168 hours.  CBC: No results for input(s): WBC, NEUTROABS, HGB, HCT, MCV, PLT in the last 168 hours. Cardiac Enzymes: No results for input(s): CKTOTAL, CKMB, CKMBINDEX, TROPONINI in the last 168 hours. BNP (last 3 results) No results for input(s): PROBNP in the last 8760 hours. CBG: Recent Labs  Lab 10/26/19 0542  GLUCAP 92   D-Dimer: No results for input(s): DDIMER in the last 72 hours. Hgb A1c: No results for input(s): HGBA1C in the last 72 hours. Lipid Profile: No results for input(s): CHOL, HDL, LDLCALC, TRIG, CHOLHDL, LDLDIRECT in the last 72 hours. Thyroid function studies: No results for input(s): TSH, T4TOTAL, T3FREE, THYROIDAB in the last 72 hours.  Invalid input(s): FREET3 Anemia work up: No results for input(s): VITAMINB12, FOLATE, FERRITIN, TIBC, IRON, RETICCTPCT in the last 72 hours. Sepsis Labs: No results for input(s): PROCALCITON, WBC, LATICACIDVEN in the last 168 hours.  Microbiology Recent Results (from the past 240 hour(s))  SARS CORONAVIRUS 2 (TAT 6-24 HRS) Nasopharyngeal Nasopharyngeal Swab     Status: None   Collection Time: 10/20/19 11:03 PM   Specimen: Nasopharyngeal Swab  Result Value Ref Range Status   SARS Coronavirus 2 NEGATIVE NEGATIVE Final    Comment: (NOTE) SARS-CoV-2 target nucleic acids are NOT DETECTED. The SARS-CoV-2 RNA is generally detectable in upper and lower respiratory specimens during the acute phase of infection.  Negative results do not preclude SARS-CoV-2 infection, do not rule out co-infections with other pathogens, and should not be used as the sole basis for treatment or other patient management decisions. Negative results must be combined with clinical observations, patient history, and epidemiological information. The expected result is Negative. Fact Sheet for Patients: HairSlick.nohttps://www.fda.gov/media/138098/download Fact Sheet for Healthcare Providers: quierodirigir.comhttps://www.fda.gov/media/138095/download This test is not yet approved or cleared by the [place] FDA and  has been authorized for detection and/or diagnosis of SARS-CoV-2 by FDA under an Emergency Use Authorization (EUA). This EUA will remain  in effect (meaning this test can be used) for the duration of the COVID-19 declaration under Section 56 4(b)(1) of the Act, 21 U.S.C. section 360bbb-3(b)(1), unless the authorization is terminated or revoked sooner. Performed at [facility], [address] [place], [place] [address]   SARS CORONAVIRUS 2 (TAT 6-24 HRS) Nasopharyngeal Nasopharyngeal Swab     Status: None   Collection Time: 10/25/19 12:57 PM   Specimen: Nasopharyngeal Swab  Result Value Ref Range Status   SARS Coronavirus 2 NEGATIVE NEGATIVE Final    Comment: (NOTE) SARS-CoV-2 target nucleic acids are NOT DETECTED. The SARS-CoV-2 RNA is generally  detectable in upper and lower respiratory specimens during the acute phase of infection. Negative results do not preclude SARS-CoV-2 infection, do not rule out co-infections with other pathogens, and should not be used as the sole basis for treatment or other patient management decisions. Negative results must be combined with clinical observations, patient history, and epidemiological information. The expected result is Negative. Fact Sheet for Patients: SugarRoll.be Fact Sheet for Healthcare  Providers: https://www.woods-mathews.com/ This test is not yet approved or cleared by the [place] FDA and  has been authorized for detection and/or diagnosis of SARS-CoV-2 by FDA under an Emergency Use Authorization (EUA). This EUA will remain  in effect (meaning this test can be used) for the duration of the COVID-19 declaration under Section 56 4(b)(1) of the Act, 21 U.S.C. section 360bbb-3(b)(1), unless the authorization is terminated or revoked sooner. Performed at [facility], [place] [address] [place], [place] [address]     Procedures and diagnostic studies:  Dg Abd Portable 1v  Result Date: 10/26/2019 CLINICAL DATA:  History of hypertension.  Anemia. EXAM: PORTABLE ABDOMEN - 1 VIEW COMPARISON:  July 10, 2018 FINDINGS: Moderate fecal loading in the colon. Degenerative changes in the lower lumbar spine and hips. No evidence of bowel obstruction. No renal or ureteral stones noted. Lung bases are normal. No other acute abnormalities. IMPRESSION: Moderate fecal loading in the colon.  No other abnormalities. Electronically Signed   By: Dorise Bullion III M.D   On: 10/26/2019 16:07    Medications:    aspirin EC  81 mg Oral Daily   atorvastatin  40 mg Oral q1800   cholecalciferol  2,000 Units Oral Daily   famotidine  10 mg Oral BID   latanoprost  1 drop Both Eyes QHS   levETIRAcetam  1,000 mg Oral BID   loxapine  25 mg Oral TID   metoprolol tartrate  12.5 mg Oral BID   OLANZapine  20 mg Oral QHS   senna-docusate  2 tablet Oral BID   sodium chloride flush  3 mL Intravenous Q12H   sucralfate  1 g Oral TID WC & HS   Continuous Infusions:   LOS: 23 days   Anna Livers  [facility]   *Please refer to [email or web address], password [email or web address] to get updated schedule on who will round on this patient, as hospitalists switch teams weekly. If 7PM-7AM, please contact night-coverage at [email or web address], password [email or web address] for any overnight needs.  10/26/2019, 7:12  PM

## 2019-10-27 MED ORDER — SODIUM CHLORIDE 0.9 % IV BOLUS: 500.0000 mL | Freq: Once | INTRAVENOUS | Status: DC

## 2019-10-27 NOTE — Progress Notes (Signed)
Lab tech notified me that pt refused lab work this morning.

## 2019-10-27 NOTE — Progress Notes (Signed)
Patient is refusing all vital signs and lab draws.

## 2019-10-27 NOTE — Progress Notes (Signed)
Pt's BP is 79/53. Notified Jason Falco, NP, and was given orders for a 500 mL NS bolus. Pt was educated, and still refused IV insertion, and fluids. Notified [place], NP.

## 2019-10-27 NOTE — Progress Notes (Signed)
PROGRESS NOTE    Jason ShadeGordon Lee Spada  JXB:[id number] DOB: 1956/01/02 DOA: 10/03/2019 PCP: Koren BoundMatrone, Andrew, NP   Brief Narrative:  Jason Patterson is an 63 y.o. male with history of hypertension, anemia of chronic disease, bipolar disorder, schizoaffective disorder, anxiety disorder, COPD, remote history of seizure disorder was brought to the hospital because of chest pain and shortness of breath.  Troponin was elevated on admission.  2D echo was unremarkable.  ACS was ruled out.  He was transfused with 1 unit of packed red blood cells because of worsening anemia.  He was evaluated by the psychiatrist for agitation and medical nonadherence.  He is awaiting placement. No change in pt condition.  Currently awaiting transfer to group home and acceptance for admission there.  Subjective: Patient refused to talk or do a physical exam, stating that you are not my doctor and get out of my room.  Assessment & Plan:   Active Problems:   Generalized anxiety disorder   Essential hypertension   COPD (chronic obstructive pulmonary disease) (HCC)   CHEST PAIN UNSPECIFIED   Schizoaffective disorder, bipolar type (HCC)   Seizures (HCC)   Elevated troponin  Bipolar disorder/schizoaffective disorder/anxiety disorder:  Continue psychotropics. Ativan as needed for anxiety. Patient underwent ECT prior to discharge.  On 10/22/2019 Appreciate psychiatry's assistance. Currently working on getting the patient to [place] [facility] [place] [facility].  Atypical chest pain.  Resolved.  ACS ruled out.  Hypertension.  Softer blood pressure today. -Holding home antihypertensives. -Continue to monitor.  Anemia of chronic disease:  s/p transfusion with 1 unit of packed red blood cells on 1117.  H&H stable. No active bleeding.  Seizure disorder.  No current seizure-like activity. -Continue Keppra.  BPH. Continue Flomax.  Hiatal hernia/GERD/constipation. Continue PPI and Carafate. Continue MiraLAX.   Objective: Vitals:   10/26/19 2119 10/27/19 0419 10/27/19 0553 10/27/19 1001  BP: 111/71 (!) 79/53 (!) 88/62 117/71  Pulse: 76 66 70 76  Resp: 17 16    Temp:  98.1 F (36.7 C)    TempSrc:  Oral    SpO2: 97% 96%    Weight:      Height:        Intake/Output Summary (Last 24 hours) at 10/27/2019 1527 Last data filed at 10/27/2019 0356 Gross per 24 hour  Intake 240 ml  Output 0 ml  Net 240 ml   Filed Weights   10/13/19 0502 10/14/19 0307 10/15/19 0016  Weight: 69.6 kg 70 kg 71.9 kg    Examination: Patient refused examination today.  General exam: Appears agitated, in no acute distress.   DVT prophylaxis:  Code Status: Full Family Communication: No family at bedside Disposition Plan: Pending group home placement.  Consultants:     Procedures:  Antimicrobials:   Data Reviewed: I have personally reviewed following labs and imaging studies  CBC: No results for input(s): WBC, NEUTROABS, HGB, HCT, MCV, PLT in the last 168 hours. Basic Metabolic Panel: Recent Labs  Lab 10/26/19 1717  NA 131*  K 3.3*  CL 100  CO2 21*  GLUCOSE 108*  BUN 16  CREATININE 0.81  CALCIUM 8.2*  MG 1.9   GFR: Estimated Creatinine Clearance: 86.1 mL/min (by C-G formula based on SCr of 0.81 mg/dL). Liver Function Tests: No results for input(s): AST, ALT, ALKPHOS, BILITOT, PROT, ALBUMIN in the last 168 hours. No results for input(s): LIPASE, AMYLASE in the last 168 hours. No results for input(s): AMMONIA in the last 168 hours. Coagulation Profile: No results  for input(s): INR, PROTIME in the last 168 hours. Cardiac Enzymes: No results for input(s): CKTOTAL, CKMB, CKMBINDEX, TROPONINI in the last 168 hours. BNP (last 3 results) No results for input(s): PROBNP in the last 8760 hours. HbA1C: No results for input(s): HGBA1C in the last 72 hours. CBG: Recent Labs  Lab 10/26/19 0542  GLUCAP 92   Lipid Profile: No results for input(s): CHOL, HDL, LDLCALC, TRIG, CHOLHDL, LDLDIRECT in  the last 72 hours. Thyroid Function Tests: No results for input(s): TSH, T4TOTAL, FREET4, T3FREE, THYROIDAB in the last 72 hours. Anemia Panel: No results for input(s): VITAMINB12, FOLATE, FERRITIN, TIBC, IRON, RETICCTPCT in the last 72 hours. Sepsis Labs: No results for input(s): PROCALCITON, LATICACIDVEN in the last 168 hours.  Recent Results (from the past 240 hour(s))  SARS CORONAVIRUS 2 (TAT 6-24 HRS) Nasopharyngeal Nasopharyngeal Swab     Status: None   Collection Time: 10/20/19 11:03 PM   Specimen: Nasopharyngeal Swab  Result Value Ref Range Status   SARS Coronavirus 2 NEGATIVE NEGATIVE Final    Comment: (NOTE) SARS-CoV-2 target nucleic acids are NOT DETECTED. The SARS-CoV-2 RNA is generally detectable in upper and lower respiratory specimens during the acute phase of infection. Negative results do not preclude SARS-CoV-2 infection, do not rule out co-infections with other pathogens, and should not be used as the sole basis for treatment or other patient management decisions. Negative results must be combined with clinical observations, patient history, and epidemiological information. The expected result is Negative. Fact Sheet for Patients: SugarRoll.be Fact Sheet for Healthcare Providers: https://www.woods-mathews.com/ This test is not yet approved or cleared by the [place] FDA and  has been authorized for detection and/or diagnosis of SARS-CoV-2 by FDA under an Emergency Use Authorization (EUA). This EUA will remain  in effect (meaning this test can be used) for the duration of the COVID-19 declaration under Section 56 4(b)(1) of the Act, 21 U.S.C. section 360bbb-3(b)(1), unless the authorization is terminated or revoked sooner. Performed at [facility], [place] [address], [place], [place] [address]   SARS CORONAVIRUS 2 (TAT 6-24 HRS) Nasopharyngeal Nasopharyngeal Swab     Status: None   Collection Time: 10/25/19  12:57 PM   Specimen: Nasopharyngeal Swab  Result Value Ref Range Status   SARS Coronavirus 2 NEGATIVE NEGATIVE Final    Comment: (NOTE) SARS-CoV-2 target nucleic acids are NOT DETECTED. The SARS-CoV-2 RNA is generally detectable in upper and lower respiratory specimens during the acute phase of infection. Negative results do not preclude SARS-CoV-2 infection, do not rule out co-infections with other pathogens, and should not be used as the sole basis for treatment or other patient management decisions. Negative results must be combined with clinical observations, patient history, and epidemiological information. The expected result is Negative. Fact Sheet for Patients: SugarRoll.be Fact Sheet for Healthcare Providers: https://www.woods-mathews.com/ This test is not yet approved or cleared by the [place] FDA and  has been authorized for detection and/or diagnosis of SARS-CoV-2 by FDA under an Emergency Use Authorization (EUA). This EUA will remain  in effect (meaning this test can be used) for the duration of the COVID-19 declaration under Section 56 4(b)(1) of the Act, 21 U.S.C. section 360bbb-3(b)(1), unless the authorization is terminated or revoked sooner. Performed at [facility], [place] [address] [place], [place] [address]      Radiology Studies: Dg Abd Portable 1v  Result Date: 10/26/2019 CLINICAL DATA:  History of hypertension.  Anemia. EXAM: PORTABLE ABDOMEN - 1 VIEW COMPARISON:  July 10, 2018 FINDINGS: Moderate fecal loading in the colon. Degenerative changes in the lower lumbar spine and hips. No evidence of bowel obstruction. No renal or ureteral stones noted. Lung bases are normal. No other acute abnormalities. IMPRESSION: Moderate fecal loading in the colon.  No other abnormalities. Electronically Signed   By: Gerome Sam III M.D   On: 10/26/2019 16:07    Scheduled Meds: . aspirin EC  81 mg Oral Daily  .  atorvastatin  40 mg Oral q1800  . cholecalciferol  2,000 Units Oral Daily  . famotidine  10 mg Oral BID  . latanoprost  1 drop Both Eyes QHS  . levETIRAcetam  1,000 mg Oral BID  . loxapine  25 mg Oral TID  . metoprolol tartrate  12.5 mg Oral BID  . OLANZapine  20 mg Oral QHS  . polyethylene glycol  17 g Oral Daily  . senna-docusate  2 tablet Oral BID  . sodium chloride flush  3 mL Intravenous Q12H  . sucralfate  1 g Oral TID WC & HS   Continuous Infusions: . sodium chloride       LOS: 24 days   Time spent: 25 minutes.  Arnetha Courser, MD [facility] Pager [phone]  If 7PM-7AM, please contact night-coverage [email or web address] Password  [facility] 10/27/2019, 3:27 PM   This record has been created using Conservation officer, historic buildings. Errors have been sought and corrected,but may not always be located. Such creation errors do not reflect on the standard of care.

## 2019-10-28 DIAGNOSIS — R112 Nausea with vomiting, unspecified: Secondary | ICD-10-CM

## 2019-10-28 NOTE — TOC Progression Note (Signed)
Transition of Care [facility]) - Progression Note    Patient Details  Name: Jason Patterson MRN: [id number] Date of Birth: June 10, 1956  [facility] CM/SW Contact  Shelbie Ammons, RN Phone Number: 10/28/2019, 9:33 AM  Clinical Narrative:     Left VM for Freda Munro patient's guardian regarding group home placement and that patient is ready for discharge.   Expected Discharge Plan: Group Home Barriers to Discharge: Barriers Unresolved (comment)(Awaiting ALF placement)  Expected Discharge Plan and Services Expected Discharge Plan: Group Home In-house Referral: Clinical Social Work Discharge Planning Services: NA   Living arrangements for the past 2 months: Group Home                           HH Arranged: NA [place] Agency: NA         Social Determinants of Health (SDOH) Interventions    Readmission Risk Interventions No flowsheet data found.

## 2019-10-28 NOTE — Progress Notes (Signed)
PROGRESS NOTE    Jason Patterson  ION:[id number] DOB: 1956/07/25 DOA: 10/03/2019 PCP: [name], NP   Brief Narrative:  Jason Patterson is an 63 y.o. male with history of hypertension, anemia of chronic disease, bipolar disorder, schizoaffective disorder, anxiety disorder, COPD, remote history of seizure disorder was brought to the hospital because of chest pain and shortness of breath.  Troponin was elevated on admission.  2D echo was unremarkable.  ACS was ruled out.  He was transfused with 1 unit of packed red blood cells because of worsening anemia.  He was evaluated by the psychiatrist for agitation and medical nonadherence.  He is awaiting placement. No change in pt condition.  Currently awaiting transfer to group home and acceptance for admission there.  Subjective: Patient was complaining of nausea and multiple vomitus, none witnessed by any nursing staff.  He was asking for more Zofran which was already provided.  Assessment & Plan:   Active Problems:   Generalized anxiety disorder   Essential hypertension   COPD (chronic obstructive pulmonary disease) (HCC)   CHEST PAIN UNSPECIFIED   Schizoaffective disorder, bipolar type (HCC)   Seizures (HCC)   Elevated troponin  Bipolar disorder/schizoaffective disorder/anxiety disorder:  Continue psychotropics. Ativan as needed for anxiety. Patient underwent ECT prior to discharge.  On 10/22/2019 Appreciate psychiatry's assistance. Currently working on getting the patient to go new group home in [facility].  Nausea/vomiting.  Patient was complaining of persistent nausea with multiple vomitus, none being witnessed by any nursing staff.  He was given Zofran at his request and asking for more. Explained to him that we cannot give him Zofran like this and we will monitor his vomitus.  He should inform the nursing staff after each vomiting so they can look at that.  Patient seems little annoyed. -Continue monitoring and  Zofran as needed.  Atypical chest pain.  Resolved.  ACS ruled out.  Hypertension.  Softer blood pressure today. -Holding home antihypertensives. -Continue to monitor.  Anemia of chronic disease:  s/p transfusion with 1 unit of packed red blood cells on 1117.  H&H stable. No active bleeding.  Seizure disorder.  No current seizure-like activity. -Continue Keppra.  BPH. Continue Flomax.  Hiatal hernia/GERD/constipation. Continue PPI and Carafate. Continue MiraLAX.  Objective: Vitals:   10/27/19 1001 10/27/19 2023 10/28/19 0359 10/28/19 1201  BP: 117/71 108/62 94/65 121/83  Pulse: 76 75 66 74  Resp:  20 20 15   Temp:  98.7 F (37.1 C) 98.4 F (36.9 C) 97.7 F (36.5 C)  TempSrc:  Oral Oral Oral  SpO2:  98% 99% 100%  Weight:      Height:       No intake or output data in the 24 hours ending 10/28/19 1442 Filed Weights   10/13/19 0502 10/14/19 0307 10/15/19 0016  Weight: 69.6 kg 70 kg 71.9 kg    Examination:  General exam: Appears agitated, in no acute distress.  Pulmonary.  Clear bilaterally, no rhonchi or wheezing. CV.  Regular rate and rhythm, no rub or murmur. Abdomen.  Soft, nondistended, nontender, bowel sounds positive. Extremities.  No edema, no cyanosis, pulses intact and symmetrical.  DVT prophylaxis: None as patient is ambulatory. Code Status: Full Family Communication: No family at bedside Disposition Plan: Pending group home placement.  Consultants:   Psychiatry  Procedures:  Antimicrobials:   Data Reviewed: I have personally reviewed following labs and imaging studies  CBC: No results for input(s): WBC, NEUTROABS, HGB, HCT, MCV, PLT in the last 168  hours. Basic Metabolic Panel: Recent Labs  Lab 10/26/19 1717  NA 131*  K 3.3*  CL 100  CO2 21*  GLUCOSE 108*  BUN 16  CREATININE 0.81  CALCIUM 8.2*  MG 1.9   GFR: Estimated Creatinine Clearance: 86.1 mL/min (by C-G formula based on SCr of 0.81 mg/dL). Liver Function Tests: No results  for input(s): AST, ALT, ALKPHOS, BILITOT, PROT, ALBUMIN in the last 168 hours. No results for input(s): LIPASE, AMYLASE in the last 168 hours. No results for input(s): AMMONIA in the last 168 hours. Coagulation Profile: No results for input(s): INR, PROTIME in the last 168 hours. Cardiac Enzymes: No results for input(s): CKTOTAL, CKMB, CKMBINDEX, TROPONINI in the last 168 hours. BNP (last 3 results) No results for input(s): PROBNP in the last 8760 hours. HbA1C: No results for input(s): HGBA1C in the last 72 hours. CBG: Recent Labs  Lab 10/26/19 0542  GLUCAP 92   Lipid Profile: No results for input(s): CHOL, HDL, LDLCALC, TRIG, CHOLHDL, LDLDIRECT in the last 72 hours. Thyroid Function Tests: No results for input(s): TSH, T4TOTAL, FREET4, T3FREE, THYROIDAB in the last 72 hours. Anemia Panel: No results for input(s): VITAMINB12, FOLATE, FERRITIN, TIBC, IRON, RETICCTPCT in the last 72 hours. Sepsis Labs: No results for input(s): PROCALCITON, LATICACIDVEN in the last 168 hours.  Recent Results (from the past 240 hour(s))  SARS CORONAVIRUS 2 (TAT 6-24 HRS) Nasopharyngeal Nasopharyngeal Swab     Status: None   Collection Time: 10/20/19 11:03 PM   Specimen: Nasopharyngeal Swab  Result Value Ref Range Status   SARS Coronavirus 2 NEGATIVE NEGATIVE Final    Comment: (NOTE) SARS-CoV-2 target nucleic acids are NOT DETECTED. The SARS-CoV-2 RNA is generally detectable in upper and lower respiratory specimens during the acute phase of infection. Negative results do not preclude SARS-CoV-2 infection, do not rule out co-infections with other pathogens, and should not be used as the sole basis for treatment or other patient management decisions. Negative results must be combined with clinical observations, patient history, and epidemiological information. The expected result is Negative. Fact Sheet for Patients: SugarRoll.be Fact Sheet for Healthcare  Providers: https://www.woods-mathews.com/ This test is not yet approved or cleared by the [place] FDA and  has been authorized for detection and/or diagnosis of SARS-CoV-2 by FDA under an Emergency Use Authorization (EUA). This EUA will remain  in effect (meaning this test can be used) for the duration of the COVID-19 declaration under Section 56 4(b)(1) of the Act, 21 U.S.C. section 360bbb-3(b)(1), unless the authorization is terminated or revoked sooner. Performed at [facility], [place] [address], [place], [place] [address]   SARS CORONAVIRUS 2 (TAT 6-24 HRS) Nasopharyngeal Nasopharyngeal Swab     Status: None   Collection Time: 10/25/19 12:57 PM   Specimen: Nasopharyngeal Swab  Result Value Ref Range Status   SARS Coronavirus 2 NEGATIVE NEGATIVE Final    Comment: (NOTE) SARS-CoV-2 target nucleic acids are NOT DETECTED. The SARS-CoV-2 RNA is generally detectable in upper and lower respiratory specimens during the acute phase of infection. Negative results do not preclude SARS-CoV-2 infection, do not rule out co-infections with other pathogens, and should not be used as the sole basis for treatment or other patient management decisions. Negative results must be combined with clinical observations, patient history, and epidemiological information. The expected result is Negative. Fact Sheet for Patients: SugarRoll.be Fact Sheet for Healthcare Providers: https://www.woods-mathews.com/ This test is not yet approved or cleared by the [place] FDA and  has been authorized for  detection and/or diagnosis of SARS-CoV-2 by FDA under an Emergency Use Authorization (EUA). This EUA will remain  in effect (meaning this test can be used) for the duration of the COVID-19 declaration under Section 56 4(b)(1) of the Act, 21 U.S.C. section 360bbb-3(b)(1), unless the authorization is terminated or revoked sooner. Performed at  [facility], [address] [place], [place] [address]      Radiology Studies: DG Abd Portable 1V  Result Date: 10/26/2019 CLINICAL DATA:  History of hypertension.  Anemia. EXAM: PORTABLE ABDOMEN - 1 VIEW COMPARISON:  July 10, 2018 FINDINGS: Moderate fecal loading in the colon. Degenerative changes in the lower lumbar spine and hips. No evidence of bowel obstruction. No renal or ureteral stones noted. Lung bases are normal. No other acute abnormalities. IMPRESSION: Moderate fecal loading in the colon.  No other abnormalities. Electronically Signed   By: Gerome Samavid  Williams III M.D   On: 10/26/2019 16:07    Scheduled Meds: . aspirin EC  81 mg Oral Daily  . atorvastatin  40 mg Oral q1800  . cholecalciferol  2,000 Units Oral Daily  . famotidine  10 mg Oral BID  . latanoprost  1 drop Both Eyes QHS  . levETIRAcetam  1,000 mg Oral BID  . loxapine  25 mg Oral TID  . metoprolol tartrate  12.5 mg Oral BID  . OLANZapine  20 mg Oral QHS  . polyethylene glycol  17 g Oral Daily  . senna-docusate  2 tablet Oral BID  . sodium chloride flush  3 mL Intravenous Q12H  . sucralfate  1 g Oral TID WC & HS   Continuous Infusions: . sodium chloride       LOS: 25 days   Time spent: 30 minutes.  Arnetha CourserSumayya Bernadine Melecio, MD [facility] Pager [phone]  If 7PM-7AM, please contact night-coverage [email or web address] Password [email or web address] 10/28/2019, 2:42 PM   This record has been created using Conservation officer, historic buildingsDragon voice recognition software. Errors have been sought and corrected,but may not always be located. Such creation errors do not reflect on the standard of care.

## 2019-10-28 NOTE — TOC Progression Note (Signed)
Transition of Care [facility]) - Progression Note    Patient Details  Name: Jason Patterson MRN: [id number] Date of Birth: 1956/03/18  Transition of [facility]) CM/SW Contact  Shade Flood, LCSW Phone Number: 10/28/2019, 4:20 PM  Clinical Narrative:     Spoke with pt's guardian, Freda Munro, who stated that she has spoken with the ALF administrator who states that she cannot get pt until Monday 11/01/19 after 2pm.   Will ask MD for updated Covid test to be drawn on Saturday 12/12.   TOC will follow.  Expected Discharge Plan: Group Home Barriers to Discharge: Barriers Unresolved (comment)(Awaiting ALF placement)  Expected Discharge Plan and Services Expected Discharge Plan: Group Home In-house Referral: Clinical Social Work Discharge Planning Services: NA   Living arrangements for the past 2 months: Group Home                           HH Arranged: NA [place] Agency: NA         Social Determinants of Health (SDOH) Interventions    Readmission Risk Interventions No flowsheet data found.

## 2019-10-29 MED ORDER — POLYETHYLENE GLYCOL 3350 17 G PO PACK: 17.0000 g | PACK | Freq: Every day | ORAL | 0 refills | Status: AC

## 2019-10-29 MED ORDER — SUCRALFATE 1 GM/10ML PO SUSP: 1.0000 g | Freq: Three times a day (TID) | ORAL | 0 refills | Status: AC

## 2019-10-29 MED ORDER — ONDANSETRON 4 MG PO TBDP: 4.0000 mg | ORAL_TABLET | Freq: Three times a day (TID) | ORAL | 0 refills | Status: AC | PRN

## 2019-10-29 MED ORDER — ATORVASTATIN CALCIUM 40 MG PO TABS: 40.0000 mg | ORAL_TABLET | Freq: Every day | ORAL | Status: AC

## 2019-10-29 MED ORDER — LORAZEPAM 0.5 MG PO TABS: 0.5000 mg | ORAL_TABLET | Freq: Four times a day (QID) | ORAL | 0 refills | Status: AC | PRN

## 2019-10-29 NOTE — Progress Notes (Signed)
PROGRESS NOTE    Jason Patterson  TWS:[id number] DOB: 03-05-1956 DOA: 10/03/2019 PCP: [name], NP   Brief Narrative:  Jason Patterson is an 63 y.o. male with history of hypertension, anemia of chronic disease, bipolar disorder, schizoaffective disorder, anxiety disorder, COPD, remote history of seizure disorder was brought to the hospital because of chest pain and shortness of breath.  Troponin was elevated on admission.  2D echo was unremarkable.  ACS was ruled out.  He was transfused with 1 unit of packed red blood cells because of worsening anemia.  He was evaluated by the psychiatrist for agitation and medical nonadherence.  He is awaiting placement. No change in pt condition.  Currently awaiting transfer to group home.  Subjective: Patient continue to have some nausea without any witnessed vomiting. Continues to state that he does not want to see me as his doctor.  We have to explain each time that we rotate according to our shifts and Dr. Eliane Decree of this week.  Assessment & Plan:   Active Problems:   Generalized anxiety disorder   Essential hypertension   COPD (chronic obstructive pulmonary disease) (HCC)   CHEST PAIN UNSPECIFIED   Schizoaffective disorder, bipolar type (HCC)   Chest wall pain   Seizures (HCC)   Elevated troponin   Intractable nausea and vomiting  Bipolar disorder/schizoaffective disorder/anxiety disorder:  Continue psychotropics. Ativan as needed for anxiety. Patient underwent ECT prior to discharge.  On 10/22/2019 Appreciate psychiatry's assistance. Currently working on getting the patient to go new group home in [facility]. Weekly going tomorrow.  Nausea/vomiting.  Patient was complaining of persistent nausea with multiple vomitus, none being witnessed by any nursing staff.  He was given Zofran at his request and asking for more. Explained to him that we cannot give him Zofran like this and we will monitor his vomitus.  He should  inform the nursing staff after each vomiting so they can look at that.  Patient seems little annoyed. -Continue monitoring and Zofran as needed.  Atypical chest pain.  Resolved.  ACS ruled out.  Hypertension.  Softer blood pressure today. -Holding home antihypertensives. -Continue to monitor.  Anemia of chronic disease:  s/p transfusion with 1 unit of packed red blood cells on 1117.  H&H stable. No active bleeding.  Seizure disorder.  No current seizure-like activity. -Continue Keppra.  BPH. Continue Flomax.  Hiatal hernia/GERD/constipation. Continue PPI and Carafate. Continue MiraLAX.  Objective: Vitals:   10/28/19 1201 10/28/19 2036 10/29/19 0437 10/29/19 0833  BP: 121/83 115/73 100/67 113/71  Pulse: 74 65 66 67  Resp: 15 20 20    Temp: 97.7 F (36.5 C) 98.2 F (36.8 C) 98.4 F (36.9 C)   TempSrc: Oral Oral Oral   SpO2: 100% 100% 98%   Weight:      Height:        Intake/Output Summary (Last 24 hours) at 10/29/2019 1603 Last data filed at 10/29/2019 1300 Gross per 24 hour  Intake 361 ml  Output --  Net 361 ml   Filed Weights   10/13/19 0502 10/14/19 0307 10/15/19 0016  Weight: 69.6 kg 70 kg 71.9 kg    Examination:  General exam: Appears calm today, in no acute distress.  Pulmonary.  Clear bilaterally, no rhonchi or wheezing. CV.  Regular rate and rhythm, no rub or murmur. Abdomen.  Soft, nondistended, nontender, bowel sounds positive. Extremities.  No edema, no cyanosis, pulses intact and symmetrical.  DVT prophylaxis: None as patient is ambulatory. Code Status: Full Family  Communication: No family at bedside Disposition Plan: Pending group home placement.  Consultants:   Psychiatry  Procedures:  Antimicrobials:   Data Reviewed: I have personally reviewed following labs and imaging studies  CBC: No results for input(s): WBC, NEUTROABS, HGB, HCT, MCV, PLT in the last 168 hours. Basic Metabolic Panel: Recent Labs  Lab 10/26/19 1717  NA 131*   K 3.3*  CL 100  CO2 21*  GLUCOSE 108*  BUN 16  CREATININE 0.81  CALCIUM 8.2*  MG 1.9   GFR: Estimated Creatinine Clearance: 86.1 mL/min (by C-G formula based on SCr of 0.81 mg/dL). Liver Function Tests: No results for input(s): AST, ALT, ALKPHOS, BILITOT, PROT, ALBUMIN in the last 168 hours. No results for input(s): LIPASE, AMYLASE in the last 168 hours. No results for input(s): AMMONIA in the last 168 hours. Coagulation Profile: No results for input(s): INR, PROTIME in the last 168 hours. Cardiac Enzymes: No results for input(s): CKTOTAL, CKMB, CKMBINDEX, TROPONINI in the last 168 hours. BNP (last 3 results) No results for input(s): PROBNP in the last 8760 hours. HbA1C: No results for input(s): HGBA1C in the last 72 hours. CBG: Recent Labs  Lab 10/26/19 0542  GLUCAP 92   Lipid Profile: No results for input(s): CHOL, HDL, LDLCALC, TRIG, CHOLHDL, LDLDIRECT in the last 72 hours. Thyroid Function Tests: No results for input(s): TSH, T4TOTAL, FREET4, T3FREE, THYROIDAB in the last 72 hours. Anemia Panel: No results for input(s): VITAMINB12, FOLATE, FERRITIN, TIBC, IRON, RETICCTPCT in the last 72 hours. Sepsis Labs: No results for input(s): PROCALCITON, LATICACIDVEN in the last 168 hours.  Recent Results (from the past 240 hour(s))  SARS CORONAVIRUS 2 (TAT 6-24 HRS) Nasopharyngeal Nasopharyngeal Swab     Status: None   Collection Time: 10/20/19 11:03 PM   Specimen: Nasopharyngeal Swab  Result Value Ref Range Status   SARS Coronavirus 2 NEGATIVE NEGATIVE Final    Comment: (NOTE) SARS-CoV-2 target nucleic acids are NOT DETECTED. The SARS-CoV-2 RNA is generally detectable in upper and lower respiratory specimens during the acute phase of infection. Negative results do not preclude SARS-CoV-2 infection, do not rule out co-infections with other pathogens, and should not be used as the sole basis for treatment or other patient management decisions. Negative results must be  combined with clinical observations, patient history, and epidemiological information. The expected result is Negative. Fact Sheet for Patients: SugarRoll.be Fact Sheet for Healthcare Providers: https://www.woods-mathews.com/ This test is not yet approved or cleared by the [place] FDA and  has been authorized for detection and/or diagnosis of SARS-CoV-2 by FDA under an Emergency Use Authorization (EUA). This EUA will remain  in effect (meaning this test can be used) for the duration of the COVID-19 declaration under Section 56 4(b)(1) of the Act, 21 U.S.C. section 360bbb-3(b)(1), unless the authorization is terminated or revoked sooner. Performed at [facility], [place] [address], [place], [place] [address]   SARS CORONAVIRUS 2 (TAT 6-24 HRS) Nasopharyngeal Nasopharyngeal Swab     Status: None   Collection Time: 10/25/19 12:57 PM   Specimen: Nasopharyngeal Swab  Result Value Ref Range Status   SARS Coronavirus 2 NEGATIVE NEGATIVE Final    Comment: (NOTE) SARS-CoV-2 target nucleic acids are NOT DETECTED. The SARS-CoV-2 RNA is generally detectable in upper and lower respiratory specimens during the acute phase of infection. Negative results do not preclude SARS-CoV-2 infection, do not rule out co-infections with other pathogens, and should not be used as the sole basis for treatment or other patient management  decisions. Negative results must be combined with clinical observations, patient history, and epidemiological information. The expected result is Negative. Fact Sheet for Patients: HairSlick.nohttps://www.fda.gov/media/138098/download Fact Sheet for Healthcare Providers: quierodirigir.comhttps://www.fda.gov/media/138095/download This test is not yet approved or cleared by the [place] FDA and  has been authorized for detection and/or diagnosis of SARS-CoV-2 by FDA under an Emergency Use Authorization (EUA). This EUA will remain  in effect (meaning  this test can be used) for the duration of the COVID-19 declaration under Section 56 4(b)(1) of the Act, 21 U.S.C. section 360bbb-3(b)(1), unless the authorization is terminated or revoked sooner. Performed at [facility], [address] [place], [place] [address]      Radiology Studies: No results found.  Scheduled Meds: . aspirin EC  81 mg Oral Daily  . atorvastatin  40 mg Oral q1800  . cholecalciferol  2,000 Units Oral Daily  . famotidine  10 mg Oral BID  . latanoprost  1 drop Both Eyes QHS  . levETIRAcetam  1,000 mg Oral BID  . loxapine  25 mg Oral TID  . metoprolol tartrate  12.5 mg Oral BID  . OLANZapine  20 mg Oral QHS  . polyethylene glycol  17 g Oral Daily  . senna-docusate  2 tablet Oral BID  . sodium chloride flush  3 mL Intravenous Q12H  . sucralfate  1 g Oral TID WC & HS   Continuous Infusions: . sodium chloride       LOS: 26 days   Time spent: 30 minutes.  Arnetha CourserSumayya Wolfe Camarena, MD [facility] Pager [phone]  If 7PM-7AM, please contact night-coverage [email or web address] Password [email or web address] 10/29/2019, 4:03 PM   This record has been created using Conservation officer, historic buildingsDragon voice recognition software. Errors have been sought and corrected,but may not always be located. Such creation errors do not reflect on the standard of care.

## 2019-10-29 NOTE — Discharge Summary (Signed)
Physician Discharge Summary  Aurthur Wingerter OZD:[id number] DOB: 01/03/1956 DOA: 10/03/2019  PCP: [name], NP  Admit date: 10/03/2019 Discharge date: 10/30/2019  Admitted From: Group home Disposition: Group home  Recommendations for Outpatient Follow-up:  1. Follow up with PCP in 1-2 weeks 2. Please obtain BMP/CBC in one week 3. Please follow up on the following pending results: None  Home Health: No Equipment/Devices: None Discharge Condition: Stable CODE STATUS: Full Diet recommendation: Heart Healthy / Carb Modified / Regular / Dysphagia   Brief/Interim Summary: Jason Patterson Blanchardis an 63 y.o.malewith history of hypertension, anemia of chronic disease, bipolar disorder, schizoaffective disorder, anxiety disorder, COPD, remote history of seizure disorder was brought to the hospital because of chest pain and shortness of breath. Troponin was elevated on admission. 2D echo was unremarkable. ACS was ruled out. He was transfused with 1 unit of packed red blood cells because of worsening anemia. He was evaluated by the psychiatrist for agitation and medical nonadherence.   He continued to complain of nausea and vomiting without any witnessed vomitus and asking for Zofran.  He will need monitoring to see if he really required Zofran for nausea and vomiting.  Blood pressure well controlled on Lopressor.  He did not had any seizure-like activity, he will continue Keppra.  Discharge Diagnoses:  Active Problems:   Generalized anxiety disorder   Essential hypertension   COPD (chronic obstructive pulmonary disease) (HCC)   CHEST PAIN UNSPECIFIED   Schizoaffective disorder, bipolar type (HCC)   Chest wall pain   Seizures (HCC)   Elevated troponin   Intractable nausea and vomiting  Discharge Instructions  Discharge Instructions    Diet - low sodium heart healthy   Complete by: As directed    Increase activity slowly   Complete by: As directed    Increase  activity slowly   Complete by: As directed      Allergies as of 10/30/2019      Reactions   Paxil [paroxetine Hcl] Other (See Comments)   Told by MD to discontinue use   Penicillins Other (See Comments)   Family history of allergies; patient's sister took once and died as a result (FYI).  Amoxicillin is ok   Tramadol Other (See Comments)   Seizure disorder.  Caused seizure.   Depakote [divalproex Sodium] Hives, Swelling   Acyclovir And Related       Medication List    TAKE these medications   albuterol 108 (90 Base) MCG/ACT inhaler Commonly known as: ProAir HFA Inhale 1 puff into the lungs every 6 (six) hours as needed for wheezing or shortness of breath.   aspirin EC 81 MG tablet Take 1 tablet (81 mg total) by mouth daily.   atorvastatin 40 MG tablet Commonly known as: LIPITOR Take 1 tablet (40 mg total) by mouth daily at 6 PM.   cholecalciferol 25 MCG (1000 UT) tablet Commonly known as: VITAMIN D Take 2 tablets (2,000 Units total) by mouth daily.   famotidine 10 MG tablet Commonly known as: PEPCID Take 1 tablet (10 mg total) by mouth 2 (two) times daily.   latanoprost 0.005 % ophthalmic solution Commonly known as: XALATAN Place 1 drop into both eyes at bedtime.   levETIRAcetam 500 MG tablet Commonly known as: KEPPRA Take 2 tablets (1,000 mg total) by mouth 2 (two) times daily.   LORazepam 0.5 MG tablet Commonly known as: ATIVAN Take 1 tablet (0.5 mg total) by mouth every 6 (six) hours as needed for anxiety.   loxapine 25  MG capsule Commonly known as: LOXITANE Take 1 capsule (25 mg total) by mouth 3 (three) times daily.   metoprolol tartrate 25 MG tablet Commonly known as: LOPRESSOR Take 0.5 tablets (12.5 mg total) by mouth 2 (two) times daily.   OLANZapine 20 MG tablet Commonly known as: ZYPREXA Take 1 tablet (20 mg total) by mouth at bedtime. What changed: Another medication with the same name was removed. Continue taking this medication, and follow the  directions you see here.   ondansetron 4 MG disintegrating tablet Commonly known as: ZOFRAN-ODT Take 1 tablet (4 mg total) by mouth every 8 (eight) hours as needed for nausea or vomiting.   polyethylene glycol 17 g packet Commonly known as: MIRALAX / GLYCOLAX Take 17 g by mouth daily.   SEROquel 25 MG tablet Generic drug: QUEtiapine Take 25 mg by mouth 3 (three) times daily.   sucralfate 1 GM/10ML suspension Commonly known as: CARAFATE Take 10 mLs (1 g total) by mouth 4 (four) times daily -  with meals and at bedtime.       Allergies  Allergen Reactions  . Paxil [Paroxetine Hcl] Other (See Comments)    Told by MD to discontinue use  . Penicillins Other (See Comments)    Family history of allergies; patient's sister took once and died as a result (FYI).  Amoxicillin is ok  . Tramadol Other (See Comments)    Seizure disorder.  Caused seizure.  . Depakote [Divalproex Sodium] Hives and Swelling  . Acyclovir And Related     Consultations:  Psychiatry  Procedures/Studies: CT ANGIO CHEST PE W OR WO CONTRAST  Result Date: 10/04/2019 CLINICAL DATA:  Positive D-dimer, PE suspected, sudden onset left chest pain tightness, 10/10 in severity EXAM: CT ANGIOGRAPHY CHEST WITH CONTRAST TECHNIQUE: Multidetector CT imaging of the chest was performed using the standard protocol during bolus administration of intravenous contrast. Multiplanar CT image reconstructions and MIPs were obtained to evaluate the vascular anatomy. CONTRAST:  75mL OMNIPAQUE IOHEXOL 350 MG/ML SOLN COMPARISON:  CTA chest 08/27/2018 FINDINGS: Cardiovascular: Satisfactory opacification the pulmonary arteries to the segmental level. No pulmonary arterial filling defects are identified. Central pulmonary arteries are normal caliber. Flattening of the intraventricular septum and right cardiac enlargement is similar to comparison CTs. Coronary artery calcifications are present. The aorta is minimally calcified but otherwise  unremarkable and normal caliber. Normal 3 vessel branching of the arch. Mediastinum/Nodes: There is circumferential esophageal wall thickening and a small to moderate hiatal hernia with a small amount of adjacent lower mediastinal low-attenuation fluid and reactive adenopathy. Diffuse secretions are seen throughout the airways and trachea. No pathologically enlarged mediastinal, hilar or axillary nodes. Thyroid gland and thoracic inlet are unremarkable. Lungs/Pleura: Diffuse paraseptal and centrilobular emphysematous changes are present throughout the lungs. Scattered calcified granulomata are noted. Evaluation for nodules and masses is somewhat limited due to respiratory motion artifact though no concerning focal abnormality is seen. There is diffuse airways thickening. No pneumothorax. No effusion. Upper Abdomen: No acute abnormalities present in the visualized portions of the upper abdomen. Musculoskeletal: Remote posttraumatic deformity of the superior third sternum Multilevel degenerative changes are present in the imaged portions of the spine. No acute osseous abnormality or suspicious osseous lesion. No suspicious chest wall lesions. Review of the MIP images confirms the above findings. IMPRESSION: 1. No evidence of pulmonary embolism. 2. Circumferential esophageal wall thickening and a small to moderate hiatal hernia with adjacent lower mediastinal low-attenuation fluid and reactive adenopathy, suggestive of esophagitis. Consider correlation with endoscopy. 3. Chronic  right atrial and ventricular enlargement with slight flattening of the septum may reflect elevated right heart pressures. 4. Diffuse secretions throughout the airways and trachea. 5. Sequela of remote granulomatous disease. 6. Emphysema (ICD10-J43.9). 7. Aortic Atherosclerosis (ICD10-I70.0) Electronically Signed   By: Kreg Shropshire M.D.   On: 10/04/2019 15:43   DG Chest Port 1 View  Result Date: 10/03/2019 CLINICAL DATA:  Chest pain EXAM:  PORTABLE CHEST 1 VIEW COMPARISON:  02/08/2019 FINDINGS: Heart and mediastinal contours are within normal limits. No focal opacities or effusions. No acute bony abnormality. Old healed left rib fractures. IMPRESSION: No active disease. Electronically Signed   By: Charlett Nose M.D.   On: 10/03/2019 01:57   DG Abd Portable 1V  Result Date: 10/26/2019 CLINICAL DATA:  History of hypertension.  Anemia. EXAM: PORTABLE ABDOMEN - 1 VIEW COMPARISON:  July 10, 2018 FINDINGS: Moderate fecal loading in the colon. Degenerative changes in the lower lumbar spine and hips. No evidence of bowel obstruction. No renal or ureteral stones noted. Lung bases are normal. No other acute abnormalities. IMPRESSION: Moderate fecal loading in the colon.  No other abnormalities. Electronically Signed   By: Gerome Sam III M.D   On: 10/26/2019 16:07   ECHOCARDIOGRAM COMPLETE  Result Date: 10/04/2019   ECHOCARDIOGRAM REPORT   Patient Name:   JIOVANNY BURDELL Date of Exam: 10/04/2019 Medical Rec #:  [id number]            Height:       67.0 in Accession #:    [id number]           Weight:       156.1 lb Date of Birth:  [birth year]            BSA:          1.82 m Patient Age:    63 years             BP:           97/76 mmHg Patient Gender: M                    HR:           80 bpm. Exam Location:  [facility] Procedure: 2D Echo, Cardiac Doppler and Color Doppler Indications:     Elevated troponin  History:         Patient has prior history of Echocardiogram examinations. COPD;                  Risk Factors:Hypertension. Seizures and anxiety disorder.  Sonographer:     Humphrey Rolls RDCS (AE) Referring Phys:  [id number] Vernetta Honey MANSY Diagnosing Phys: Marcina Millard MD  Sonographer Comments: Technically difficult study due to poor echo windows. Image acquisition challenging due to uncooperative patient and Image acquisition challenging due to COPD. IMPRESSIONS  1. Left ventricular ejection fraction, by visual estimation, is 55 to 60%. The left  ventricle has normal function. There is no left ventricular hypertrophy.  2. Global right ventricle has normal systolic function.The right ventricular size is normal. No increase in right ventricular wall thickness.  3. Left atrial size was normal.  4. Right atrial size was normal.  5. The mitral valve is normal in structure. Trace mitral valve regurgitation. No evidence of mitral stenosis.  6. The tricuspid valve is normal in structure. Tricuspid valve regurgitation is trivial.  7. The aortic valve is normal in structure. Aortic valve regurgitation is not visualized. No evidence of aortic valve sclerosis  or stenosis.  8. The pulmonic valve was normal in structure. Pulmonic valve regurgitation is not visualized.  9. The inferior vena cava is normal in size with greater than 50% respiratory variability, suggesting right atrial pressure of 3 mmHg. FINDINGS  Left Ventricle: Left ventricular ejection fraction, by visual estimation, is 55 to 60%. The left ventricle has normal function. There is no left ventricular hypertrophy. Normal left atrial pressure. Right Ventricle: The right ventricular size is normal. No increase in right ventricular wall thickness. Global RV systolic function is has normal systolic function. Left Atrium: Left atrial size was normal in size. Right Atrium: Right atrial size was normal in size Pericardium: There is no evidence of pericardial effusion. Mitral Valve: The mitral valve is normal in structure. No evidence of mitral valve stenosis by observation. MV peak gradient, 2.7 mmHg. Trace mitral valve regurgitation. Tricuspid Valve: The tricuspid valve is normal in structure. Tricuspid valve regurgitation is trivial. Aortic Valve: The aortic valve is normal in structure. Aortic valve regurgitation is not visualized. The aortic valve is structurally normal, with no evidence of sclerosis or stenosis. Aortic valve mean gradient measures 8.0 mmHg. Aortic valve peak gradient measures 14.0 mmHg. Aortic  valve area, by VTI measures 1.29 cm. Pulmonic Valve: The pulmonic valve was normal in structure. Pulmonic valve regurgitation is not visualized. Aorta: The aortic root, ascending aorta and aortic arch are all structurally normal, with no evidence of dilitation or obstruction. Venous: The inferior vena cava is normal in size with greater than 50% respiratory variability, suggesting right atrial pressure of 3 mmHg. IAS/Shunts: No atrial level shunt detected by color flow Doppler. No ventricular septal defect is seen or detected. There is no evidence of an atrial septal defect.  LEFT VENTRICLE PLAX 2D LVIDd:         5.02 cm  Diastology LVIDs:         3.64 cm  LV e' lateral:   12.30 cm/s LV PW:         0.94 cm  LV E/e' lateral: 3.6 LV IVS:        0.78 cm  LV e' medial:    6.20 cm/s LVOT diam:     2.00 cm  LV E/e' medial:  7.0 LV SV:         63 ml LV SV Index:   34.53 LVOT Area:     3.14 cm  LEFT ATRIUM           Index LA diam:      3.40 cm 1.87 cm/m LA Vol (A2C): 23.3 ml 12.80 ml/m LA Vol (A4C): 34.2 ml 18.79 ml/m  AORTIC VALVE                    PULMONIC VALVE AV Area (Vmax):    1.61 cm     PV Vmax:       1.25 m/s AV Area (Vmean):   1.33 cm     PV Vmean:      84.100 cm/s AV Area (VTI):     1.29 cm     PV VTI:        0.199 m AV Vmax:           187.00 cm/s  PV Peak grad:  6.2 mmHg AV Vmean:          133.000 cm/s PV Mean grad:  3.0 mmHg AV VTI:            0.293 m AV Peak Grad:  14.0 mmHg AV Mean Grad:      8.0 mmHg LVOT Vmax:         95.80 cm/s LVOT Vmean:        56.200 cm/s LVOT VTI:          0.120 m LVOT/AV VTI ratio: 0.41  AORTA Ao Root diam: 4.30 cm MITRAL VALVE MV Area (PHT): 2.62 cm             SHUNTS MV Peak grad:  2.7 mmHg             Systemic VTI:  0.12 m MV Mean grad:  1.0 mmHg             Systemic Diam: 2.00 cm MV Vmax:       0.82 m/s MV Vmean:      45.7 cm/s MV VTI:        0.13 m MV PHT:        83.81 msec MV Decel Time: 289 msec MV E velocity: 43.70 cm/s 103 cm/s MV A velocity: 68.40 cm/s 70.3  cm/s MV E/A ratio:  0.64       1.5  Marcina Millard MD Electronically signed by Marcina Millard MD Signature Date/Time: 10/04/2019/1:32:06 PM    Final    DG ESOPHAGUS W DOUBLE CM (HD)  Result Date: 10/06/2019 CLINICAL DATA:  Chest discomfort, heartburn EXAM: ESOPHOGRAM / BARIUM SWALLOW / BARIUM TABLET STUDY TECHNIQUE: Combined double contrast and single contrast examination performed using effervescent crystals, thick barium liquid, and thin barium liquid. The patient was observed with fluoroscopy swallowing a 13 mm barium sulphate tablet. FLUOROSCOPY TIME:  Fluoroscopy Time:  1.1 minute Radiation Exposure Index (if provided by the fluoroscopic device): 8.4 mGy Number of Acquired Spot Images: 0 COMPARISON:  None. FINDINGS: Degenerative disc disease with disc height loss at C4-5, C5-6 and C6-7 on the lateral view. Normal pharyngeal anatomy and motility. Contrast flowed freely through the esophagus without evidence of a stricture or mass. Normal esophageal mucosa without evidence of irregularity or ulceration. Esophageal motility was normal. No evidence of reflux. No definite hiatal hernia was demonstrated. At the end of the examination a 13 mm barium tablet was administered which transited through the esophagus and esophagogastric junction without delay. IMPRESSION: Somewhat limited examination as the patient was moving during the the entirety of the exam. No large esophageal stricture, mass, dysmotility or gastroesophageal reflux. Electronically Signed   By: Elige Ko   On: 10/06/2019 10:48   Subjective: Patient was feeling better when seen this morning.  He was ready to go to his new group home, stating he is little nervous to meet new people.  Discharge Exam: Vitals:   10/30/19 0557 10/30/19 0851  BP: 109/72 (!) 129/102  Pulse: 71 72  Resp: 20   Temp: 98.4 F (36.9 C)   SpO2: 99%    Vitals:   10/29/19 2022 10/30/19 0556 10/30/19 0557 10/30/19 0851  BP: 103/80 109/72 109/72 (!)  129/102  Pulse: 66 67 71 72  Resp: 18 20 20    Temp: 97.6 F (36.4 C) 98.4 F (36.9 C) 98.4 F (36.9 C)   TempSrc: Oral Oral Oral   SpO2: 97% 98% 99%   Weight:      Height:        General: Pt is alert, awake, not in acute distress Cardiovascular: RRR, S1/S2 +, no rubs, no gallops Respiratory: CTA bilaterally, no wheezing, no rhonchi Abdominal: Soft, NT, ND, bowel sounds + Extremities: no edema, no cyanosis  The results of significant diagnostics from this hospitalization (including imaging, microbiology, ancillary and laboratory) are listed below for reference.     Microbiology: Recent Results (from the past 240 hour(s))  SARS CORONAVIRUS 2 (TAT 6-24 HRS) Nasopharyngeal Nasopharyngeal Swab     Status: None   Collection Time: 10/20/19 11:03 PM   Specimen: Nasopharyngeal Swab  Result Value Ref Range Status   SARS Coronavirus 2 NEGATIVE NEGATIVE Final    Comment: (NOTE) SARS-CoV-2 target nucleic acids are NOT DETECTED. The SARS-CoV-2 RNA is generally detectable in upper and lower respiratory specimens during the acute phase of infection. Negative results do not preclude SARS-CoV-2 infection, do not rule out co-infections with other pathogens, and should not be used as the sole basis for treatment or other patient management decisions. Negative results must be combined with clinical observations, patient history, and epidemiological information. The expected result is Negative. Fact Sheet for Patients: HairSlick.nohttps://www.fda.gov/media/138098/download Fact Sheet for Healthcare Providers: quierodirigir.comhttps://www.fda.gov/media/138095/download This test is not yet approved or cleared by the [place] FDA and  has been authorized for detection and/or diagnosis of SARS-CoV-2 by FDA under an Emergency Use Authorization (EUA). This EUA will remain  in effect (meaning this test can be used) for the duration of the COVID-19 declaration under Section 56 4(b)(1) of the Act, 21 U.S.C. section  360bbb-3(b)(1), unless the authorization is terminated or revoked sooner. Performed at [facility], [address] [place], [place] [address]   SARS CORONAVIRUS 2 (TAT 6-24 HRS) Nasopharyngeal Nasopharyngeal Swab     Status: None   Collection Time: 10/25/19 12:57 PM   Specimen: Nasopharyngeal Swab  Result Value Ref Range Status   SARS Coronavirus 2 NEGATIVE NEGATIVE Final    Comment: (NOTE) SARS-CoV-2 target nucleic acids are NOT DETECTED. The SARS-CoV-2 RNA is generally detectable in upper and lower respiratory specimens during the acute phase of infection. Negative results do not preclude SARS-CoV-2 infection, do not rule out co-infections with other pathogens, and should not be used as the sole basis for treatment or other patient management decisions. Negative results must be combined with clinical observations, patient history, and epidemiological information. The expected result is Negative. Fact Sheet for Patients: HairSlick.nohttps://www.fda.gov/media/138098/download Fact Sheet for Healthcare Providers: quierodirigir.comhttps://www.fda.gov/media/138095/download This test is not yet approved or cleared by the [place] FDA and  has been authorized for detection and/or diagnosis of SARS-CoV-2 by FDA under an Emergency Use Authorization (EUA). This EUA will remain  in effect (meaning this test can be used) for the duration of the COVID-19 declaration under Section 56 4(b)(1) of the Act, 21 U.S.C. section 360bbb-3(b)(1), unless the authorization is terminated or revoked sooner. Performed at [facility], [address] [place], [place] [address]      Labs: BNP (last 3 results) No results for input(s): BNP in the last 8760 hours. Basic Metabolic Panel: Recent Labs  Lab 10/26/19 1717  NA 131*  K 3.3*  CL 100  CO2 21*  GLUCOSE 108*  BUN 16  CREATININE 0.81  CALCIUM 8.2*  MG 1.9   Liver Function Tests: No results for input(s): AST, ALT, ALKPHOS, BILITOT, PROT, ALBUMIN in  the last 168 hours. No results for input(s): LIPASE, AMYLASE in the last 168 hours. No results for input(s): AMMONIA in the last 168 hours. CBC: No results for input(s): WBC, NEUTROABS, HGB, HCT, MCV, PLT in the last 168 hours. Cardiac Enzymes: No results for input(s): CKTOTAL, CKMB, CKMBINDEX, TROPONINI in the last 168 hours. BNP: Invalid input(s): POCBNP CBG: Recent  Labs  Lab 10/26/19 0542  GLUCAP 92   D-Dimer No results for input(s): DDIMER in the last 72 hours. Hgb A1c No results for input(s): HGBA1C in the last 72 hours. Lipid Profile No results for input(s): CHOL, HDL, LDLCALC, TRIG, CHOLHDL, LDLDIRECT in the last 72 hours. Thyroid function studies No results for input(s): TSH, T4TOTAL, T3FREE, THYROIDAB in the last 72 hours.  Invalid input(s): FREET3 Anemia work up No results for input(s): VITAMINB12, FOLATE, FERRITIN, TIBC, IRON, RETICCTPCT in the last 72 hours. Urinalysis    Component Value Date/Time   COLORURINE YELLOW 08/27/2018 1700   APPEARANCEUR CLOUDY (A) 08/27/2018 1700   APPEARANCEUR Clear 04/28/2012 1940   LABSPEC 1.008 08/27/2018 1700   LABSPEC 1.006 04/28/2012 1940   PHURINE 6.0 08/27/2018 1700   GLUCOSEU 50 (A) 08/27/2018 1700   GLUCOSEU Negative 04/28/2012 1940   HGBUR LARGE (A) 08/27/2018 1700   BILIRUBINUR NEGATIVE 08/27/2018 1700   BILIRUBINUR Negative 04/28/2012 1940   KETONESUR NEGATIVE 08/27/2018 1700   PROTEINUR NEGATIVE 08/27/2018 1700   UROBILINOGEN 0.2 05/31/2015 0220   NITRITE NEGATIVE 08/27/2018 1700   LEUKOCYTESUR NEGATIVE 08/27/2018 1700   LEUKOCYTESUR Negative 04/28/2012 1940   Sepsis Labs Invalid input(s): PROCALCITONIN,  WBC,  LACTICIDVEN Microbiology Recent Results (from the past 240 hour(s))  SARS CORONAVIRUS 2 (TAT 6-24 HRS) Nasopharyngeal Nasopharyngeal Swab     Status: None   Collection Time: 10/20/19 11:03 PM   Specimen: Nasopharyngeal Swab  Result Value Ref Range Status   SARS Coronavirus 2 NEGATIVE NEGATIVE Final     Comment: (NOTE) SARS-CoV-2 target nucleic acids are NOT DETECTED. The SARS-CoV-2 RNA is generally detectable in upper and lower respiratory specimens during the acute phase of infection. Negative results do not preclude SARS-CoV-2 infection, do not rule out co-infections with other pathogens, and should not be used as the sole basis for treatment or other patient management decisions. Negative results must be combined with clinical observations, patient history, and epidemiological information. The expected result is Negative. Fact Sheet for Patients: HairSlick.no Fact Sheet for Healthcare Providers: quierodirigir.com This test is not yet approved or cleared by the [place] FDA and  has been authorized for detection and/or diagnosis of SARS-CoV-2 by FDA under an Emergency Use Authorization (EUA). This EUA will remain  in effect (meaning this test can be used) for the duration of the COVID-19 declaration under Section 56 4(b)(1) of the Act, 21 U.S.C. section 360bbb-3(b)(1), unless the authorization is terminated or revoked sooner. Performed at [facility], [address] [place], [place] [address]   SARS CORONAVIRUS 2 (TAT 6-24 HRS) Nasopharyngeal Nasopharyngeal Swab     Status: None   Collection Time: 10/25/19 12:57 PM   Specimen: Nasopharyngeal Swab  Result Value Ref Range Status   SARS Coronavirus 2 NEGATIVE NEGATIVE Final    Comment: (NOTE) SARS-CoV-2 target nucleic acids are NOT DETECTED. The SARS-CoV-2 RNA is generally detectable in upper and lower respiratory specimens during the acute phase of infection. Negative results do not preclude SARS-CoV-2 infection, do not rule out co-infections with other pathogens, and should not be used as the sole basis for treatment or other patient management decisions. Negative results must be combined with clinical observations, patient history, and epidemiological  information. The expected result is Negative. Fact Sheet for Patients: HairSlick.no Fact Sheet for Healthcare Providers: quierodirigir.com This test is not yet approved or cleared by the [place] FDA and  has been authorized for detection and/or diagnosis of SARS-CoV-2 by FDA under an Emergency Use  Authorization (EUA). This EUA will remain  in effect (meaning this test can be used) for the duration of the COVID-19 declaration under Section 56 4(b)(1) of the Act, 21 U.S.C. section 360bbb-3(b)(1), unless the authorization is terminated or revoked sooner. Performed at [facility], [address], [place], [place] [address]     Time coordinating discharge: Over 30 minutes  SIGNED:  Arnetha Courser, MD  [facility] 10/30/2019, 9:33 AM Pager [phone]  If 7PM-7AM, please contact night-coverage [email or web address] Password [email or web address]  This record has been created using Conservation officer, historic buildings. Errors have been sought and corrected,but may not always be located. Such creation errors do not reflect on the standard of care.

## 2019-10-29 NOTE — Progress Notes (Signed)
This Primary RN called [facility] transport today at 8:24 Pm to schedule pick up tomorrow at 11:00 for pt. transport to [facility] but Coralyn Mark, the guy that answered this call declined to guarantee a pick up time at 11:00. He instructed me to call back tomorrow at 10:00 am. They can not set a pick up appointment today. It has to be 1 hour before transport. Therefore will call again tomorrow.

## 2019-10-29 NOTE — NC FL2 (Signed)
[place] LEVEL OF CARE SCREENING TOOL     IDENTIFICATION  Patient Name: Jason Patterson Birthdate: 1955/12/04 Sex: male Admission Date (Current Location): 10/03/2019  [place] and [place] Number:  Jason Patterson [id number] [place] and Address:  [facility], [address], [place], [place] [address]      Provider Number: [id number]  Attending Physician Name and Address:  Lorella Nimrod, MD  Relative Name and Phone Number:  [employer]ing,[employer]ives Guardianship  ([place]  [phone] or    Current Level of Care: Hospital Recommended Level of Care: [place] Prior Approval Number:    Date Approved/Denied:   PASRR Number:    Discharge Plan: Other (Comment)(Assisted Living Facility)    Current Diagnoses: Patient Active Problem List   Diagnosis Date Noted  . Intractable nausea and vomiting   . Elevated troponin 10/03/2019  . Respiratory distress 08/28/2018  . Hyperglycemia 08/28/2018  . Acute kidney injury superimposed on chronic kidney disease ([place]) 08/28/2018  . Elevated transaminase level 08/28/2018  . Seizure ([place]) 08/27/2018  . Gastritis 03/25/2015  . Seizures ([place]) 03/24/2015  . Coffee ground emesis   . Syncope 03/23/2015  . Fall 02/16/2015  . Traumatic pneumothorax 02/15/2015  . Rib fractures 02/15/2015  . Sepsis due to urinary tract infection ([place]) 02/03/2015  . Altered mental status 02/03/2015  . SIRS (systemic inflammatory response syndrome) ([place]) 02/03/2015  . Protein-calorie malnutrition, severe ([place]) 02/03/2015  . UTI (lower urinary tract infection)   . Ulnar neuropathy at elbow of left upper extremity 06/23/2014  . Chest wall pain 05/01/2014  . Schizoaffective disorder, bipolar type ([place]) 04/26/2014  . Generalized anxiety disorder 05/01/2009  . Essential hypertension 05/01/2009  . RIGHT BUNDLE BRANCH BLOCK 05/01/2009  . COPD (chronic obstructive pulmonary disease)  ([place]) 05/01/2009  . GERD 05/01/2009  . PSORIASIS 05/01/2009  . Convulsions ([place]) 05/01/2009  . CHEST PAIN UNSPECIFIED 05/01/2009    Orientation RESPIRATION BLADDER Height & Weight     Self, Time, Situation, Place  Normal Continent Weight: 71.9 kg Height:  5\' 7"  (170.2 cm)(per pt)  BEHAVIORAL SYMPTOMS/MOOD NEUROLOGICAL BOWEL NUTRITION STATUS  Other (Comment)(Paranoia)   Continent Diet(regular)  AMBULATORY STATUS COMMUNICATION OF NEEDS Skin   Independent Verbally Normal                       Personal Care Assistance Level of Assistance  Bathing, Dressing, Feeding Bathing Assistance: Independent Feeding assistance: Independent Dressing Assistance: Independent     Functional Limitations Info  Sight, Hearing, Speech Sight Info: Adequate Hearing Info: Adequate Speech Info: Adequate    SPECIAL CARE FACTORS FREQUENCY                       Contractures      Additional Factors Info  Code Status, Allergies, Psychotropic Code Status Info: Full Code Allergies Info: Paxil, Penicillins Tramadol Depakote, Acyclovir And Related Psychotropic Info: OLANZapine (ZYPREXA) tablet 20 mg           Medication Details Next Dose Due Morning Afternoon Evening Bedtime As Needed   albuterol 108 (90 Base) MCG/ACT inhaler Commonly known as: ProAir HFA Inhale 1 puff into the lungs every 6 (six) hours as needed for wheezing or shortness of breath. For: Asthma          aspirin EC 81 MG tablet Take 1 tablet (81 mg total) by mouth daily. For: Stable Angina Pectoris          atorvastatin 40  MG tablet Commonly known as: LIPITOR Take 1 tablet (40 mg total) by mouth daily at 6 PM.          cholecalciferol 25 MCG (1000 UT) tablet Commonly known as: VITAMIN D Take 2 tablets (2,000 Units total) by mouth daily. For: Vitamin D Deficiency          famotidine 10 MG tablet Commonly known as: PEPCID Take 1 tablet (10 mg total) by mouth 2 (two) times daily. For: Gastroesophageal Reflux  Disease          latanoprost 0.005 % ophthalmic solution Commonly known as: XALATAN Place 1 drop into both eyes at bedtime. For: Wide-Angle Glaucoma          levETIRAcetam 500 MG tablet Commonly known as: KEPPRA Take 2 tablets (1,000 mg total) by mouth 2 (two) times daily. For: Seizure          LORazepam 0.5 MG tablet Commonly known as: ATIVAN Take 1 tablet (0.5 mg total) by mouth every 6 (six) hours as needed for anxiety.          loxapine 25 MG capsule Commonly known as: LOXITANE Take 1 capsule (25 mg total) by mouth 3 (three) times daily. For: Schizophrenia          metoprolol tartrate 25 MG tablet Commonly known as: LOPRESSOR Take 0.5 tablets (12.5 mg total) by mouth 2 (two) times daily. For: High Blood Pressure Disorder          OLANZapine 20 MG tablet Commonly known as: ZYPREXA Take 1 tablet (20 mg total) by mouth at bedtime. What changed: Another medication with the same name was removed. Continue taking this medication, and follow the directions you see here. For: Psychotic Depressive Illness          ondansetron 4 MG disintegrating tablet Commonly known as: ZOFRAN-ODT Take 1 tablet (4 mg total) by mouth every 8 (eight) hours as needed for nausea or vomiting.          polyethylene glycol 17 g packet Commonly known as: MIRALAX / GLYCOLAX Start taking on: October 30, 2019 Take 17 g by mouth daily.          SEROquel 25 MG tablet Take 25 mg by mouth 3 (three) times daily. Generic drug: QUEtiapine          sucralfate 1 GM/10ML suspension Commonly known as: CARAFATE Take 10 mLs (1 g total) by mouth 4 (four) times daily - with meals and at bedtime.           Relevant Imaging Results:  Relevant Lab Results:   Additional Information SSN [id number]  Chapman Fitch, RN

## 2019-10-29 NOTE — TOC Progression Note (Signed)
Transition of Care [facility]) - Progression Note    Patient Details  Name: Edwen Mclester MRN: [id number] Date of Birth: [birth year]  [facility]) CM/SW Contact  Beverly Sessions, RN Phone Number: 10/29/2019, 6:36 PM  Clinical Narrative:    MD signed medication orders for discharge.  These medications were added to Fl2, and signed by MD Faxed to guardian Freda Munro, she confirmed she received   Charge RN who will have the patient tomorrow is aware that [facility] can transport this pt but ride should be set up tomorrow morning 1 hour before transport is needed.  Please call [facility] after hours phone: [phone] to schedule the ride on Saturday morning   Expected Discharge Plan: Assisted Living Barriers to Discharge: Barriers Unresolved (comment)(Awaiting ALF placement)  Expected Discharge Plan and Services Expected Discharge Plan: Assisted Living In-house Referral: Clinical Social Work Discharge Planning Services: NA   Living arrangements for the past 2 months: Group Home Expected Discharge Date: 10/29/19                         HH Arranged: NA [place] Agency: NA         Social Determinants of Health (SDOH) Interventions    Readmission Risk Interventions No flowsheet data found.

## 2019-10-29 NOTE — TOC Progression Note (Addendum)
Supervisor spoke to pt legal guardian, Celene Skeen, [phone].  She has spoken to Elberta Leatherwood, Scientist, physiological at [facility] living and they can accept pt tomorrow if [facility] can arrange secure transport.   [facility] [place], [place] [address] [address] Cell: [phone].  Winferd Humphrey, MSW, LCSW Advanced Care Supervisor 10/29/2019 11:07 AM   Supervisor spoke to Celene Skeen regarding pt leaving [place] 11am, arriving at facility around noon.  Freda Munro said this should be fine.  Supervisor left message with Elberta Leatherwood regarding the same. Supervisor spoke with Wilhelmenia Blase, [facility].  [facility] can transport this pt but ride should be set up tomorrow morning 1 hour before transport is needed.  Please call [facility] after hours phone: [phone] to schedule the ride on Saturday morning. Winferd Humphrey, MSW, LCSW Advanced Care Supervisor 10/29/2019 2:36 PM

## 2019-10-30 DIAGNOSIS — R1310 Dysphagia, unspecified: Secondary | ICD-10-CM

## 2019-10-30 NOTE — Progress Notes (Signed)
Jason Patterson to be D/C'd to [facility]. [facility] picked up patient at 11:00. [facility] was called to be notified that patient was on his way. No one picked up. Will try again later.  per MD order.  Discussed prescriptions and follow up appointments with the patient. Prescriptions given to patient, medication list explained in detail. Pt verbalized understanding.  Allergies as of 10/30/2019       Reactions   Paxil [paroxetine Hcl] Other (See Comments)   Told by MD to discontinue use   Penicillins Other (See Comments)   Family history of allergies; patient's sister took once and died as a result (FYI).  Amoxicillin is ok   Tramadol Other (See Comments)   Seizure disorder.  Caused seizure.   Depakote [divalproex Sodium] Hives, Swelling   Acyclovir And Related         Medication List     TAKE these medications    albuterol 108 (90 Base) MCG/ACT inhaler Commonly known as: ProAir HFA Inhale 1 puff into the lungs every 6 (six) hours as needed for wheezing or shortness of breath.   aspirin EC 81 MG tablet Take 1 tablet (81 mg total) by mouth daily.   atorvastatin 40 MG tablet Commonly known as: LIPITOR Take 1 tablet (40 mg total) by mouth daily at 6 PM.   cholecalciferol 25 MCG (1000 UT) tablet Commonly known as: VITAMIN D Take 2 tablets (2,000 Units total) by mouth daily.   famotidine 10 MG tablet Commonly known as: PEPCID Take 1 tablet (10 mg total) by mouth 2 (two) times daily.   latanoprost 0.005 % ophthalmic solution Commonly known as: XALATAN Place 1 drop into both eyes at bedtime.   levETIRAcetam 500 MG tablet Commonly known as: KEPPRA Take 2 tablets (1,000 mg total) by mouth 2 (two) times daily.   LORazepam 0.5 MG tablet Commonly known as: ATIVAN Take 1 tablet (0.5 mg total) by mouth every 6 (six) hours as needed for anxiety.   loxapine 25 MG capsule Commonly known as: LOXITANE Take 1 capsule (25 mg  total) by mouth 3 (three) times daily.   metoprolol tartrate 25 MG tablet Commonly known as: LOPRESSOR Take 0.5 tablets (12.5 mg total) by mouth 2 (two) times daily.   OLANZapine 20 MG tablet Commonly known as: ZYPREXA Take 1 tablet (20 mg total) by mouth at bedtime. What changed: Another medication with the same name was removed. Continue taking this medication, and follow the directions you see here.   ondansetron 4 MG disintegrating tablet Commonly known as: ZOFRAN-ODT Take 1 tablet (4 mg total) by mouth every 8 (eight) hours as needed for nausea or vomiting.   polyethylene glycol 17 g packet Commonly known as: MIRALAX / GLYCOLAX Take 17 g by mouth daily.   SEROquel 25 MG tablet Generic drug: QUEtiapine Take 25 mg by mouth 3 (three) times daily.   sucralfate 1 GM/10ML suspension Commonly known as: CARAFATE Take 10 mLs (1 g total) by mouth 4 (four) times daily -  with meals and at bedtime.        Vitals:   10/30/19 0557 10/30/19 0851  BP: 109/72 (!) 129/102  Pulse: 71 72  Resp: 20   Temp: 98.4 F (36.9 C)   SpO2: 99%     Skin clean, dry and intact without evidence of skin break down, no evidence of skin tears noted. IV catheter discontinued intact. Site without signs and symptoms of complications. Dressing and pressure applied. Pt  denies pain at this time. No complaints noted.  An After Visit Summary was printed and given to the patient. Patient escorted via [place], and D/C home via private auto.  Rodney A Shammond Arave

## 2019-10-30 NOTE — TOC Progression Note (Signed)
Transition of Care [facility]) - Progression Note    Patient Details  Name: Jason Patterson MRN: [id number] Date of Birth: [birth year]  Transition of [facility]) CM/SW Contact  Marshell Garfinkel, RN Phone Number: 10/30/2019, 8:21 AM  Clinical Narrative:     Spoke with RN regarding patient transportation status- she will call RNCM back as she feels everything has been set up for transition today.   Expected Discharge Plan: Assisted Living Barriers to Discharge: Barriers Unresolved (comment)(Awaiting ALF placement)  Expected Discharge Plan and Services Expected Discharge Plan: Assisted Living In-house Referral: Clinical Social Work Discharge Planning Services: NA   Living arrangements for the past 2 months: Group Home Expected Discharge Date: 10/29/19                         HH Arranged: NA [place] Agency: NA         Social Determinants of Health (SDOH) Interventions    Readmission Risk Interventions No flowsheet data found.

## 2019-12-21 IMAGING — DX DG CHEST 1V PORT
1 series · 1 of 1 positions shown · non-contrast
Comparison: 02/08/2019

CLINICAL DATA: Chest pain

EXAM:
PORTABLE CHEST 1 VIEW

[chest ap]
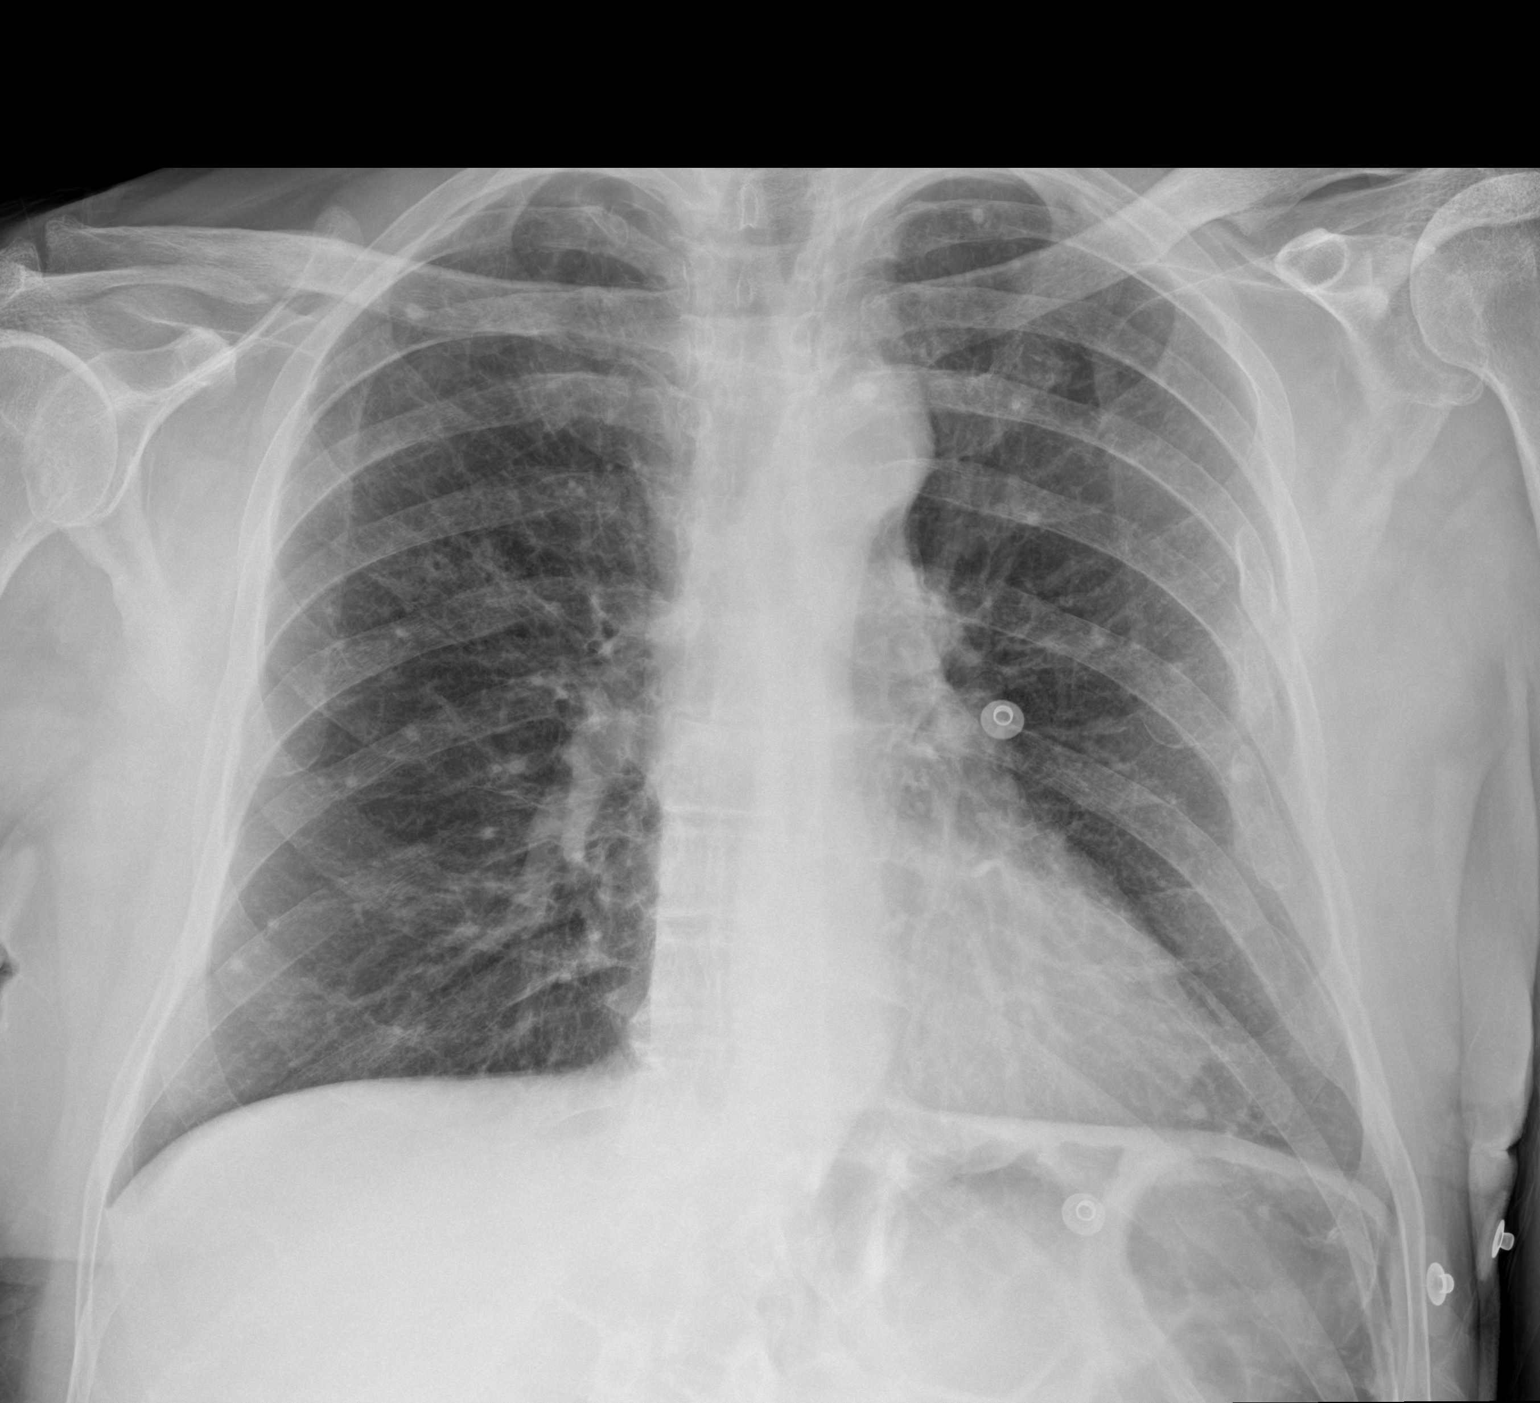

[1 of 1 positions shown; findings below may reference images not displayed]

FINDINGS: Heart and mediastinal contours are within normal limits. No focal
opacities or effusions. No acute bony abnormality. Old healed left
rib fractures.
IMPRESSION: No active disease.

## 2020-01-13 IMAGING — DX DG ABD PORTABLE 1V
1 series · 2 of 2 positions shown · non-contrast
Comparison: July 10, 2018

CLINICAL DATA: History of hypertension.  Anemia.

EXAM:
PORTABLE ABDOMEN - 1 VIEW

[Series 2: abdomen supine · 0.14mm/px · 2 of 2 slices shown]
[im 1/2]
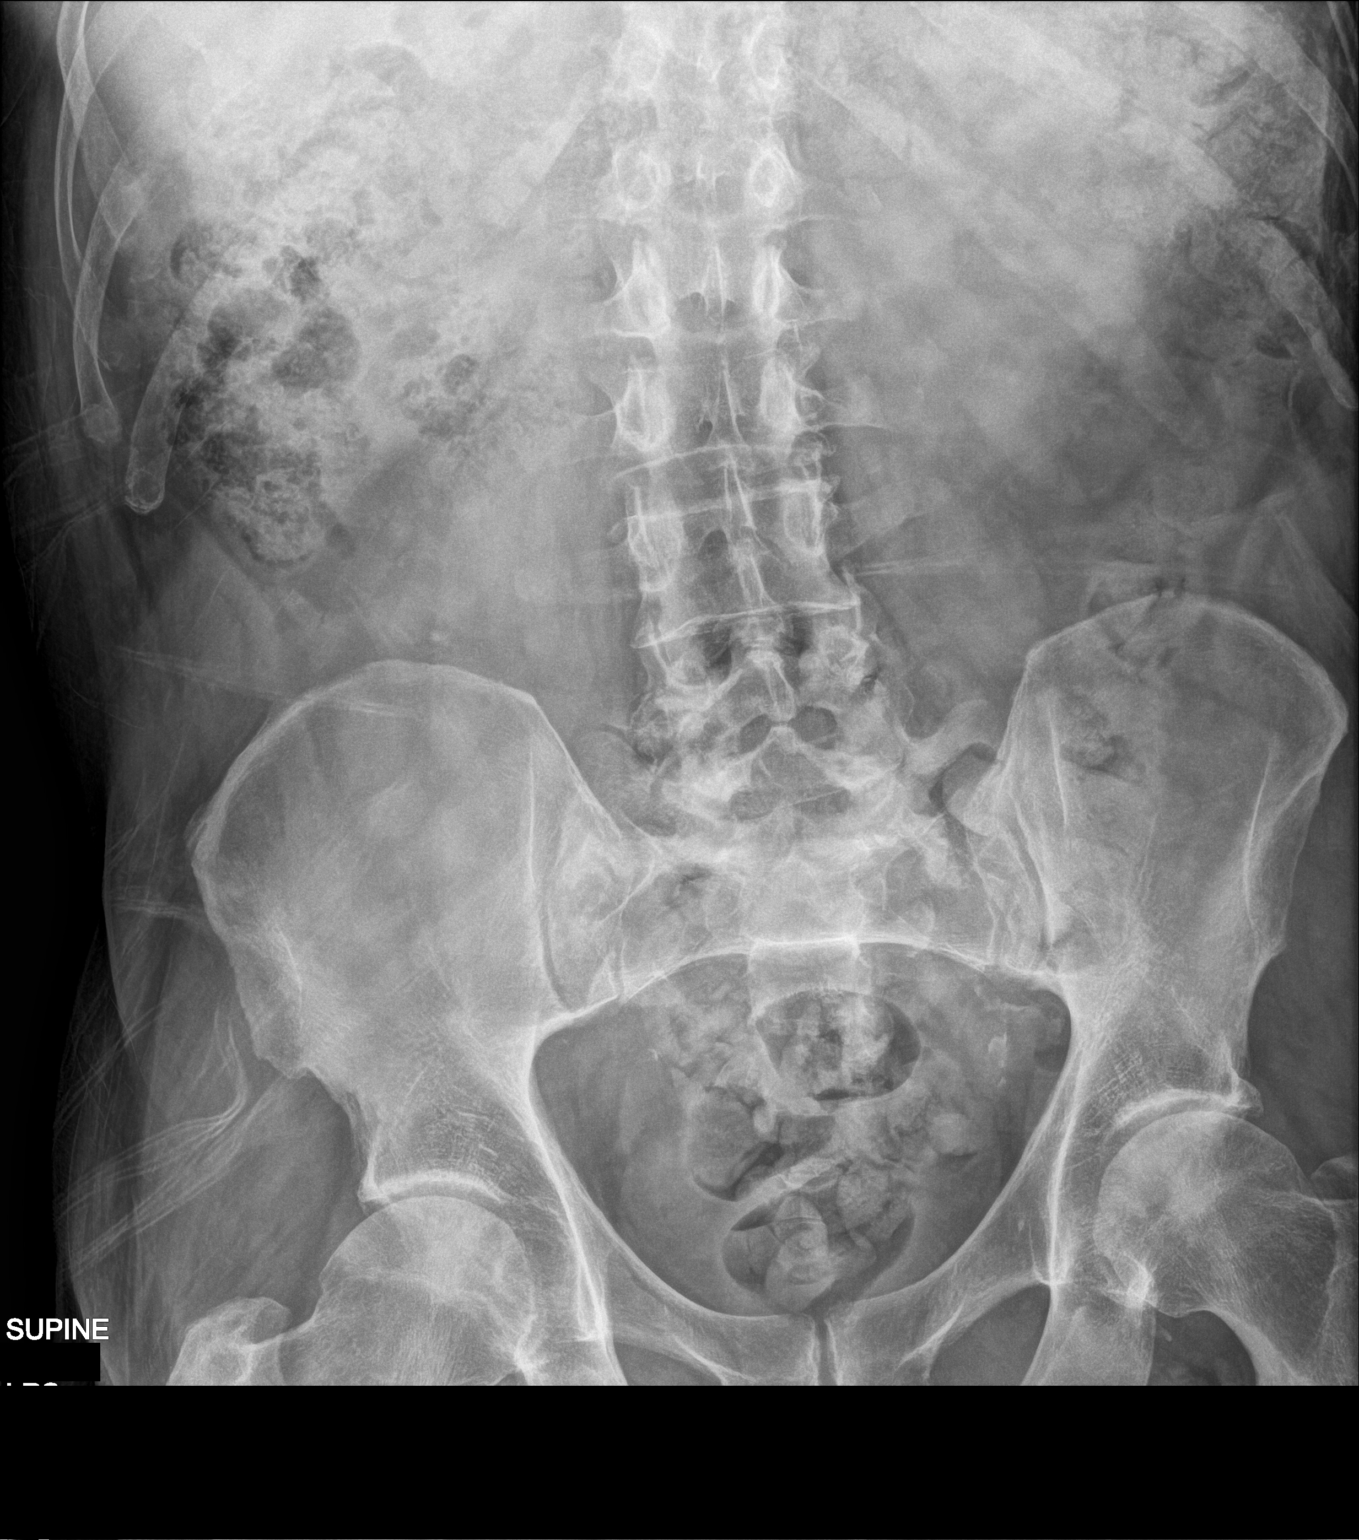
[im 2/2]
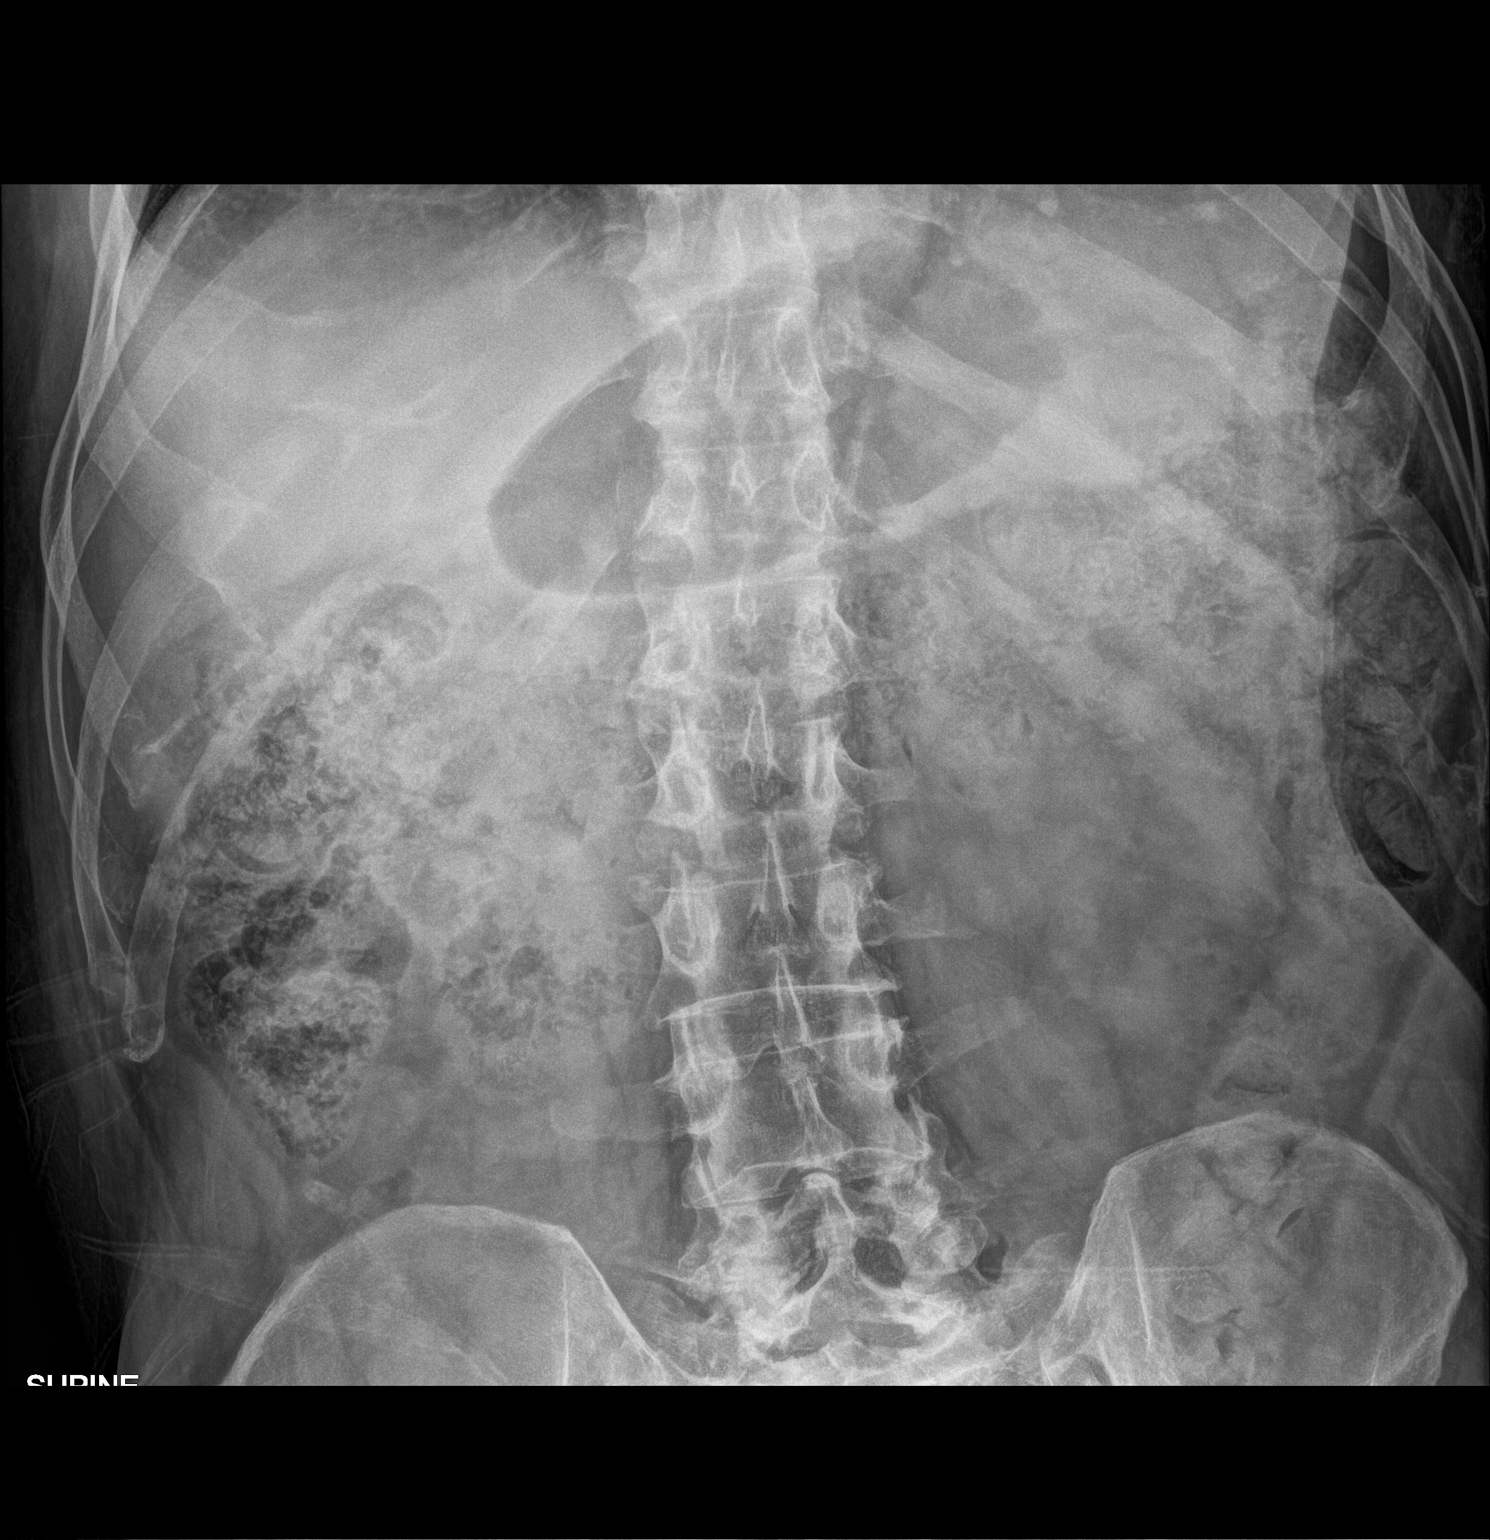

[2 of 2 positions shown; findings below may reference images not displayed]

FINDINGS: Moderate fecal loading in the colon. Degenerative changes in the
lower lumbar spine and hips. No evidence of bowel obstruction. No
renal or ureteral stones noted. Lung bases are normal. No other
acute abnormalities.
IMPRESSION: Moderate fecal loading in the colon.  No other abnormalities.

## 2022-02-16 DEATH — deceased
